# Patient Record
Sex: Female | Born: 1945 | Race: Black or African American | Hispanic: No | Marital: Married | Smoking: Former smoker
Health system: Southern US, Community
[De-identification: ages and names within clinical notes are randomized; demographics above are authoritative.]

## PROBLEM LIST (undated history)

## (undated) DIAGNOSIS — A159 Respiratory tuberculosis unspecified: Secondary | ICD-10-CM

## (undated) DIAGNOSIS — I890 Lymphedema, not elsewhere classified: Secondary | ICD-10-CM

## (undated) DIAGNOSIS — I10 Essential (primary) hypertension: Secondary | ICD-10-CM

## (undated) DIAGNOSIS — C801 Malignant (primary) neoplasm, unspecified: Secondary | ICD-10-CM

## (undated) DIAGNOSIS — G629 Polyneuropathy, unspecified: Secondary | ICD-10-CM

## (undated) DIAGNOSIS — Z923 Personal history of irradiation: Secondary | ICD-10-CM

## (undated) DIAGNOSIS — R0602 Shortness of breath: Secondary | ICD-10-CM

## (undated) DIAGNOSIS — M1990 Unspecified osteoarthritis, unspecified site: Secondary | ICD-10-CM

## (undated) DIAGNOSIS — E785 Hyperlipidemia, unspecified: Secondary | ICD-10-CM

## (undated) DIAGNOSIS — H9311 Tinnitus, right ear: Secondary | ICD-10-CM

## (undated) DIAGNOSIS — C50919 Malignant neoplasm of unspecified site of unspecified female breast: Secondary | ICD-10-CM

## (undated) DIAGNOSIS — I499 Cardiac arrhythmia, unspecified: Secondary | ICD-10-CM

## (undated) HISTORY — DX: Hyperlipidemia, unspecified: E78.5

## (undated) HISTORY — PX: TUBAL LIGATION: SHX77

## (undated) HISTORY — PX: LUNG BIOPSY: SHX232

## (undated) HISTORY — DX: Lymphedema, not elsewhere classified: I89.0

## (undated) HISTORY — PX: LESION EXCISION: SHX5167

## (undated) HISTORY — PX: ABDOMINAL HYSTERECTOMY: SHX81

## (undated) HISTORY — PX: PORTACATH PLACEMENT: SHX2246

## (undated) HISTORY — DX: Unspecified osteoarthritis, unspecified site: M19.90

---

## 1999-03-31 ENCOUNTER — Ambulatory Visit (HOSPITAL_COMMUNITY): Admission: RE | Admit: 1999-03-31 | Discharge: 1999-03-31 | Payer: Self-pay | Admitting: *Deleted

## 2000-04-24 ENCOUNTER — Emergency Department (HOSPITAL_COMMUNITY): Admission: EM | Admit: 2000-04-24 | Discharge: 2000-04-24 | Payer: Self-pay | Admitting: Emergency Medicine

## 2001-10-01 ENCOUNTER — Encounter: Payer: Self-pay | Admitting: Emergency Medicine

## 2001-10-01 ENCOUNTER — Emergency Department (HOSPITAL_COMMUNITY): Admission: EM | Admit: 2001-10-01 | Discharge: 2001-10-01 | Payer: Self-pay | Admitting: Emergency Medicine

## 2001-10-18 ENCOUNTER — Encounter: Payer: Self-pay | Admitting: Thoracic Surgery

## 2001-10-18 ENCOUNTER — Encounter (INDEPENDENT_AMBULATORY_CARE_PROVIDER_SITE_OTHER): Payer: Self-pay

## 2001-10-18 ENCOUNTER — Inpatient Hospital Stay (HOSPITAL_COMMUNITY): Admission: RE | Admit: 2001-10-18 | Discharge: 2001-10-23 | Payer: Self-pay | Admitting: Thoracic Surgery

## 2001-10-19 ENCOUNTER — Encounter: Payer: Self-pay | Admitting: Thoracic Surgery

## 2001-10-22 ENCOUNTER — Encounter: Payer: Self-pay | Admitting: Thoracic Surgery

## 2001-10-31 ENCOUNTER — Encounter: Admission: RE | Admit: 2001-10-31 | Discharge: 2001-10-31 | Payer: Self-pay | Admitting: Thoracic Surgery

## 2001-10-31 ENCOUNTER — Encounter: Payer: Self-pay | Admitting: Thoracic Surgery

## 2001-11-27 ENCOUNTER — Encounter: Admission: RE | Admit: 2001-11-27 | Discharge: 2001-11-27 | Payer: Self-pay | Admitting: Internal Medicine

## 2002-02-26 ENCOUNTER — Encounter: Admission: RE | Admit: 2002-02-26 | Discharge: 2002-02-26 | Payer: Self-pay | Admitting: Internal Medicine

## 2002-03-03 ENCOUNTER — Emergency Department (HOSPITAL_COMMUNITY): Admission: EM | Admit: 2002-03-03 | Discharge: 2002-03-04 | Payer: Self-pay | Admitting: Emergency Medicine

## 2002-03-26 ENCOUNTER — Encounter: Admission: RE | Admit: 2002-03-26 | Discharge: 2002-03-26 | Payer: Self-pay | Admitting: Internal Medicine

## 2002-06-03 ENCOUNTER — Ambulatory Visit (HOSPITAL_COMMUNITY): Admission: RE | Admit: 2002-06-03 | Discharge: 2002-06-03 | Payer: Self-pay | Admitting: Orthopedic Surgery

## 2002-06-03 ENCOUNTER — Encounter: Payer: Self-pay | Admitting: Orthopedic Surgery

## 2002-06-04 ENCOUNTER — Ambulatory Visit (HOSPITAL_COMMUNITY): Admission: RE | Admit: 2002-06-04 | Discharge: 2002-06-04 | Payer: Self-pay | Admitting: Orthopedic Surgery

## 2003-06-10 ENCOUNTER — Ambulatory Visit (HOSPITAL_BASED_OUTPATIENT_CLINIC_OR_DEPARTMENT_OTHER): Admission: RE | Admit: 2003-06-10 | Discharge: 2003-06-10 | Payer: Self-pay | Admitting: Cardiovascular Disease

## 2003-06-19 ENCOUNTER — Encounter: Payer: Self-pay | Admitting: Cardiovascular Disease

## 2003-06-19 ENCOUNTER — Ambulatory Visit (HOSPITAL_COMMUNITY): Admission: RE | Admit: 2003-06-19 | Discharge: 2003-06-19 | Payer: Self-pay | Admitting: Cardiovascular Disease

## 2003-07-17 ENCOUNTER — Ambulatory Visit (HOSPITAL_COMMUNITY): Admission: RE | Admit: 2003-07-17 | Discharge: 2003-07-17 | Payer: Self-pay | Admitting: Family Medicine

## 2003-07-29 ENCOUNTER — Other Ambulatory Visit: Admission: RE | Admit: 2003-07-29 | Discharge: 2003-07-29 | Payer: Self-pay | Admitting: Internal Medicine

## 2004-01-23 ENCOUNTER — Ambulatory Visit (HOSPITAL_COMMUNITY): Admission: RE | Admit: 2004-01-23 | Discharge: 2004-01-23 | Payer: Self-pay | Admitting: Internal Medicine

## 2004-02-07 ENCOUNTER — Emergency Department (HOSPITAL_COMMUNITY): Admission: EM | Admit: 2004-02-07 | Discharge: 2004-02-07 | Payer: Self-pay | Admitting: *Deleted

## 2004-06-02 ENCOUNTER — Ambulatory Visit: Payer: Self-pay | Admitting: Internal Medicine

## 2004-09-01 ENCOUNTER — Emergency Department (HOSPITAL_COMMUNITY): Admission: EM | Admit: 2004-09-01 | Discharge: 2004-09-01 | Payer: Self-pay | Admitting: Emergency Medicine

## 2007-07-23 ENCOUNTER — Emergency Department (HOSPITAL_COMMUNITY): Admission: EM | Admit: 2007-07-23 | Discharge: 2007-07-23 | Payer: Self-pay | Admitting: Family Medicine

## 2007-07-29 ENCOUNTER — Emergency Department (HOSPITAL_COMMUNITY): Admission: EM | Admit: 2007-07-29 | Discharge: 2007-07-29 | Payer: Self-pay | Admitting: *Deleted

## 2007-08-31 ENCOUNTER — Ambulatory Visit: Payer: Self-pay | Admitting: Internal Medicine

## 2007-09-14 ENCOUNTER — Encounter: Payer: Self-pay | Admitting: Internal Medicine

## 2007-09-14 ENCOUNTER — Ambulatory Visit: Payer: Self-pay | Admitting: Internal Medicine

## 2007-09-17 ENCOUNTER — Encounter: Admission: RE | Admit: 2007-09-17 | Discharge: 2007-09-17 | Payer: Self-pay | Admitting: *Deleted

## 2007-09-17 ENCOUNTER — Encounter (INDEPENDENT_AMBULATORY_CARE_PROVIDER_SITE_OTHER): Payer: Self-pay | Admitting: Diagnostic Radiology

## 2007-10-18 ENCOUNTER — Encounter (INDEPENDENT_AMBULATORY_CARE_PROVIDER_SITE_OTHER): Payer: Self-pay | Admitting: General Surgery

## 2007-10-18 ENCOUNTER — Ambulatory Visit (HOSPITAL_COMMUNITY): Admission: RE | Admit: 2007-10-18 | Discharge: 2007-10-18 | Payer: Self-pay | Admitting: General Surgery

## 2007-10-18 ENCOUNTER — Encounter: Admission: RE | Admit: 2007-10-18 | Discharge: 2007-10-18 | Payer: Self-pay | Admitting: General Surgery

## 2009-01-29 ENCOUNTER — Emergency Department (HOSPITAL_COMMUNITY): Admission: EM | Admit: 2009-01-29 | Discharge: 2009-01-29 | Payer: Self-pay | Admitting: Emergency Medicine

## 2009-06-05 ENCOUNTER — Encounter (INDEPENDENT_AMBULATORY_CARE_PROVIDER_SITE_OTHER): Payer: Self-pay | Admitting: Obstetrics & Gynecology

## 2009-06-05 ENCOUNTER — Ambulatory Visit (HOSPITAL_COMMUNITY): Admission: RE | Admit: 2009-06-05 | Discharge: 2009-06-05 | Payer: Self-pay | Admitting: Internal Medicine

## 2009-07-18 ENCOUNTER — Emergency Department (HOSPITAL_COMMUNITY): Admission: EM | Admit: 2009-07-18 | Discharge: 2009-07-18 | Payer: Self-pay | Admitting: Emergency Medicine

## 2009-08-14 ENCOUNTER — Inpatient Hospital Stay (HOSPITAL_COMMUNITY): Admission: RE | Admit: 2009-08-14 | Discharge: 2009-08-16 | Payer: Self-pay | Admitting: Obstetrics & Gynecology

## 2009-08-14 ENCOUNTER — Encounter (INDEPENDENT_AMBULATORY_CARE_PROVIDER_SITE_OTHER): Payer: Self-pay | Admitting: Obstetrics & Gynecology

## 2010-05-11 ENCOUNTER — Emergency Department (HOSPITAL_COMMUNITY): Admission: EM | Admit: 2010-05-11 | Discharge: 2010-05-11 | Payer: Self-pay | Admitting: Emergency Medicine

## 2010-05-13 ENCOUNTER — Encounter: Admission: RE | Admit: 2010-05-13 | Discharge: 2010-05-13 | Payer: Self-pay | Admitting: Internal Medicine

## 2010-05-17 ENCOUNTER — Encounter: Admission: RE | Admit: 2010-05-17 | Discharge: 2010-05-17 | Payer: Self-pay | Admitting: Internal Medicine

## 2010-05-25 ENCOUNTER — Encounter: Admission: RE | Admit: 2010-05-25 | Discharge: 2010-05-25 | Payer: Self-pay | Admitting: Internal Medicine

## 2010-05-27 ENCOUNTER — Encounter: Admission: RE | Admit: 2010-05-27 | Discharge: 2010-05-27 | Payer: Self-pay | Admitting: Internal Medicine

## 2010-06-01 ENCOUNTER — Ambulatory Visit: Payer: Self-pay | Admitting: Oncology

## 2010-06-03 ENCOUNTER — Encounter: Admission: RE | Admit: 2010-06-03 | Discharge: 2010-06-03 | Payer: Self-pay | Admitting: Internal Medicine

## 2010-06-08 ENCOUNTER — Ambulatory Visit: Payer: Self-pay | Admitting: Oncology

## 2010-06-09 LAB — CBC WITH DIFFERENTIAL/PLATELET
Eosinophils Absolute: 0.1 10*3/uL (ref 0.0–0.5)
HCT: 34.2 % — ABNORMAL LOW (ref 34.8–46.6)
LYMPH%: 26.8 % (ref 14.0–49.7)
MCHC: 32.5 g/dL (ref 31.5–36.0)
MCV: 91.9 fL (ref 79.5–101.0)
MONO#: 0.5 10*3/uL (ref 0.1–0.9)
MONO%: 9.6 % (ref 0.0–14.0)
NEUT#: 3 10*3/uL (ref 1.5–6.5)
NEUT%: 62.2 % (ref 38.4–76.8)
Platelets: 283 10*3/uL (ref 145–400)
WBC: 4.9 10*3/uL (ref 3.9–10.3)

## 2010-06-09 LAB — COMPREHENSIVE METABOLIC PANEL
BUN: 18 mg/dL (ref 6–23)
CO2: 28 mEq/L (ref 19–32)
Calcium: 9 mg/dL (ref 8.4–10.5)
Chloride: 103 mEq/L (ref 96–112)
Creatinine, Ser: 0.84 mg/dL (ref 0.40–1.20)

## 2010-06-09 LAB — CANCER ANTIGEN 27.29: CA 27.29: 87 U/mL — ABNORMAL HIGH (ref 0–39)

## 2010-06-15 ENCOUNTER — Ambulatory Visit (HOSPITAL_COMMUNITY): Admission: RE | Admit: 2010-06-15 | Discharge: 2010-06-15 | Payer: Self-pay | Admitting: General Surgery

## 2010-06-16 ENCOUNTER — Ambulatory Visit (HOSPITAL_COMMUNITY): Admission: RE | Admit: 2010-06-16 | Discharge: 2010-06-16 | Payer: Self-pay | Admitting: Oncology

## 2010-06-17 ENCOUNTER — Ambulatory Visit: Payer: Self-pay | Admitting: Cardiology

## 2010-06-17 ENCOUNTER — Encounter: Payer: Self-pay | Admitting: Oncology

## 2010-06-17 ENCOUNTER — Ambulatory Visit: Admission: RE | Admit: 2010-06-17 | Discharge: 2010-06-17 | Payer: Self-pay | Admitting: Oncology

## 2010-07-01 LAB — CBC WITH DIFFERENTIAL/PLATELET
Eosinophils Absolute: 0.1 10*3/uL (ref 0.0–0.5)
HCT: 34.1 % — ABNORMAL LOW (ref 34.8–46.6)
LYMPH%: 12.3 % — ABNORMAL LOW (ref 14.0–49.7)
MCHC: 33.1 g/dL (ref 31.5–36.0)
MCV: 90.9 fL (ref 79.5–101.0)
MONO#: 2.5 10*3/uL — ABNORMAL HIGH (ref 0.1–0.9)
MONO%: 15.2 % — ABNORMAL HIGH (ref 0.0–14.0)
NEUT#: 11.8 10*3/uL — ABNORMAL HIGH (ref 1.5–6.5)
NEUT%: 71.8 % (ref 38.4–76.8)
Platelets: 173 10*3/uL (ref 145–400)
RBC: 3.75 10*6/uL (ref 3.70–5.45)
WBC: 16.5 10*3/uL — ABNORMAL HIGH (ref 3.9–10.3)

## 2010-07-01 LAB — COMPREHENSIVE METABOLIC PANEL
BUN: 20 mg/dL (ref 6–23)
CO2: 25 mEq/L (ref 19–32)
Calcium: 9.6 mg/dL (ref 8.4–10.5)
Chloride: 100 mEq/L (ref 96–112)
Creatinine, Ser: 0.88 mg/dL (ref 0.40–1.20)
Glucose, Bld: 137 mg/dL — ABNORMAL HIGH (ref 70–99)
Total Bilirubin: 0.3 mg/dL (ref 0.3–1.2)

## 2010-07-08 ENCOUNTER — Ambulatory Visit: Payer: Self-pay | Admitting: Oncology

## 2010-07-08 LAB — CBC WITH DIFFERENTIAL/PLATELET
BASO%: 0.3 % (ref 0.0–2.0)
EOS%: 0 % (ref 0.0–7.0)
HCT: 31.5 % — ABNORMAL LOW (ref 34.8–46.6)
LYMPH%: 9.1 % — ABNORMAL LOW (ref 14.0–49.7)
MCH: 29.6 pg (ref 25.1–34.0)
MCHC: 32.4 g/dL (ref 31.5–36.0)
MCV: 91.3 fL (ref 79.5–101.0)
MONO#: 0.2 10*3/uL (ref 0.1–0.9)
MONO%: 2.3 % (ref 0.0–14.0)
NEUT%: 88.3 % — ABNORMAL HIGH (ref 38.4–76.8)
Platelets: 123 10*3/uL — ABNORMAL LOW (ref 145–400)
RBC: 3.45 10*6/uL — ABNORMAL LOW (ref 3.70–5.45)
WBC: 9.2 10*3/uL (ref 3.9–10.3)
nRBC: 0 % (ref 0–0)

## 2010-07-14 ENCOUNTER — Encounter: Payer: Self-pay | Admitting: Oncology

## 2010-07-14 ENCOUNTER — Ambulatory Visit
Admission: RE | Admit: 2010-07-14 | Discharge: 2010-07-14 | Payer: Self-pay | Source: Home / Self Care | Admitting: Oncology

## 2010-07-14 ENCOUNTER — Ambulatory Visit: Payer: Self-pay | Admitting: Cardiovascular Disease

## 2010-07-15 LAB — CBC WITH DIFFERENTIAL/PLATELET
Basophils Absolute: 0.1 10*3/uL (ref 0.0–0.1)
EOS%: 1.4 % (ref 0.0–7.0)
Eosinophils Absolute: 0.1 10*3/uL (ref 0.0–0.5)
HCT: 30.6 % — ABNORMAL LOW (ref 34.8–46.6)
HGB: 9.7 g/dL — ABNORMAL LOW (ref 11.6–15.9)
MCH: 29.7 pg (ref 25.1–34.0)
MCV: 93.6 fL (ref 79.5–101.0)
MONO%: 18.3 % — ABNORMAL HIGH (ref 0.0–14.0)
NEUT#: 2 10*3/uL (ref 1.5–6.5)
NEUT%: 44.6 % (ref 38.4–76.8)
lymph#: 1.5 10*3/uL (ref 0.9–3.3)

## 2010-07-15 LAB — COMPREHENSIVE METABOLIC PANEL
CO2: 23 mEq/L (ref 19–32)
Chloride: 105 mEq/L (ref 96–112)
Glucose, Bld: 153 mg/dL — ABNORMAL HIGH (ref 70–99)
Potassium: 4.4 mEq/L (ref 3.5–5.3)
Sodium: 137 mEq/L (ref 135–145)
Total Bilirubin: 0.4 mg/dL (ref 0.3–1.2)

## 2010-07-21 ENCOUNTER — Ambulatory Visit: Payer: Self-pay | Admitting: Cardiovascular Disease

## 2010-07-21 ENCOUNTER — Ambulatory Visit (HOSPITAL_COMMUNITY): Admission: RE | Admit: 2010-07-21 | Discharge: 2010-07-21 | Payer: Self-pay | Admitting: Oncology

## 2010-07-21 ENCOUNTER — Encounter: Payer: Self-pay | Admitting: Oncology

## 2010-07-23 LAB — CBC WITH DIFFERENTIAL/PLATELET
Basophils Absolute: 0 10*3/uL (ref 0.0–0.1)
EOS%: 0.7 % (ref 0.0–7.0)
Eosinophils Absolute: 0.1 10*3/uL (ref 0.0–0.5)
HGB: 9.5 g/dL — ABNORMAL LOW (ref 11.6–15.9)
LYMPH%: 23.2 % (ref 14.0–49.7)
MCH: 30.1 pg (ref 25.1–34.0)
MCV: 91.8 fL (ref 79.5–101.0)
MONO%: 13.3 % (ref 0.0–14.0)
Platelets: 163 10*3/uL (ref 145–400)
RDW: 13.5 % (ref 11.2–14.5)

## 2010-07-29 ENCOUNTER — Encounter: Payer: Self-pay | Admitting: Cardiovascular Disease

## 2010-07-29 LAB — CBC WITH DIFFERENTIAL/PLATELET
BASO%: 1 % (ref 0.0–2.0)
EOS%: 0 % (ref 0.0–7.0)
LYMPH%: 36.3 % (ref 14.0–49.7)
MCHC: 32.5 g/dL (ref 31.5–36.0)
MCV: 92 fL (ref 79.5–101.0)
MONO#: 0.7 10*3/uL (ref 0.1–0.9)
MONO%: 11.3 % (ref 0.0–14.0)
Platelets: 162 10*3/uL (ref 145–400)
RBC: 3.14 10*6/uL — ABNORMAL LOW (ref 3.70–5.45)
WBC: 6.1 10*3/uL (ref 3.9–10.3)
nRBC: 0 % (ref 0–0)

## 2010-08-05 ENCOUNTER — Encounter: Payer: Self-pay | Admitting: Cardiovascular Disease

## 2010-08-05 LAB — COMPREHENSIVE METABOLIC PANEL
AST: 17 U/L (ref 0–37)
Albumin: 3.4 g/dL — ABNORMAL LOW (ref 3.5–5.2)
Alkaline Phosphatase: 55 U/L (ref 39–117)
Potassium: 4.2 mEq/L (ref 3.5–5.3)
Sodium: 139 mEq/L (ref 135–145)
Total Bilirubin: 0.3 mg/dL (ref 0.3–1.2)
Total Protein: 6.7 g/dL (ref 6.0–8.3)

## 2010-08-05 LAB — CBC WITH DIFFERENTIAL/PLATELET
BASO%: 0.9 % (ref 0.0–2.0)
EOS%: 0.4 % (ref 0.0–7.0)
MCH: 29.6 pg (ref 25.1–34.0)
MCHC: 32.2 g/dL (ref 31.5–36.0)
RBC: 3.18 10*6/uL — ABNORMAL LOW (ref 3.70–5.45)
RDW: 14.9 % — ABNORMAL HIGH (ref 11.2–14.5)
lymph#: 2 10*3/uL (ref 0.9–3.3)

## 2010-08-10 ENCOUNTER — Ambulatory Visit (HOSPITAL_COMMUNITY)
Admission: RE | Admit: 2010-08-10 | Discharge: 2010-08-10 | Payer: Self-pay | Source: Home / Self Care | Attending: Oncology | Admitting: Oncology

## 2010-08-10 ENCOUNTER — Ambulatory Visit: Payer: Self-pay | Admitting: Oncology

## 2010-08-10 ENCOUNTER — Encounter: Payer: Self-pay | Admitting: Oncology

## 2010-08-12 ENCOUNTER — Encounter: Payer: Self-pay | Admitting: Cardiovascular Disease

## 2010-08-12 LAB — CBC WITH DIFFERENTIAL/PLATELET
BASO%: 0.4 % (ref 0.0–2.0)
Basophils Absolute: 0 10*3/uL (ref 0.0–0.1)
EOS%: 0.5 % (ref 0.0–7.0)
HCT: 30.5 % — ABNORMAL LOW (ref 34.8–46.6)
HGB: 9.7 g/dL — ABNORMAL LOW (ref 11.6–15.9)
LYMPH%: 18.5 % (ref 14.0–49.7)
MCH: 30 pg (ref 25.1–34.0)
MCHC: 31.8 g/dL (ref 31.5–36.0)
NEUT%: 71 % (ref 38.4–76.8)
Platelets: 224 10*3/uL (ref 145–400)
lymph#: 1.7 10*3/uL (ref 0.9–3.3)

## 2010-08-19 LAB — CBC WITH DIFFERENTIAL/PLATELET
Basophils Absolute: 0 10*3/uL (ref 0.0–0.1)
EOS%: 0.1 % (ref 0.0–7.0)
HGB: 9.4 g/dL — ABNORMAL LOW (ref 11.6–15.9)
LYMPH%: 23.3 % (ref 14.0–49.7)
MCH: 30 pg (ref 25.1–34.0)
MCV: 94.2 fL (ref 79.5–101.0)
MONO%: 8.4 % (ref 0.0–14.0)
Platelets: 153 10*3/uL (ref 145–400)
RBC: 3.13 10*6/uL — ABNORMAL LOW (ref 3.70–5.45)
RDW: 15.8 % — ABNORMAL HIGH (ref 11.2–14.5)

## 2010-08-26 LAB — CBC WITH DIFFERENTIAL/PLATELET
BASO%: 0.5 % (ref 0.0–2.0)
EOS%: 0.1 % (ref 0.0–7.0)
HCT: 29.6 % — ABNORMAL LOW (ref 34.8–46.6)
LYMPH%: 21.3 % (ref 14.0–49.7)
MCH: 31.5 pg (ref 25.1–34.0)
MCHC: 32.6 g/dL (ref 31.5–36.0)
MCV: 96.6 fL (ref 79.5–101.0)
MONO#: 0.4 10*3/uL (ref 0.1–0.9)
MONO%: 9.6 % (ref 0.0–14.0)
NEUT%: 68.5 % (ref 38.4–76.8)
Platelets: 236 10*3/uL (ref 145–400)
RBC: 3.06 10*6/uL — ABNORMAL LOW (ref 3.70–5.45)
WBC: 4.7 10*3/uL (ref 3.9–10.3)

## 2010-08-26 LAB — COMPREHENSIVE METABOLIC PANEL
ALT: 12 U/L (ref 0–35)
AST: 15 U/L (ref 0–37)
Alkaline Phosphatase: 52 U/L (ref 39–117)
Creatinine, Ser: 0.9 mg/dL (ref 0.40–1.20)
Sodium: 138 mEq/L (ref 135–145)
Total Bilirubin: 0.3 mg/dL (ref 0.3–1.2)
Total Protein: 7.3 g/dL (ref 6.0–8.3)

## 2010-09-02 ENCOUNTER — Encounter: Payer: Self-pay | Admitting: Cardiovascular Disease

## 2010-09-02 LAB — COMPREHENSIVE METABOLIC PANEL
ALT: 18 U/L (ref 0–35)
AST: 18 U/L (ref 0–37)
Albumin: 3.5 g/dL (ref 3.5–5.2)
Alkaline Phosphatase: 75 U/L (ref 39–117)
BUN: 11 mg/dL (ref 6–23)
CO2: 29 mEq/L (ref 19–32)
Calcium: 9.2 mg/dL (ref 8.4–10.5)
Chloride: 98 mEq/L (ref 96–112)
Creatinine, Ser: 0.96 mg/dL (ref 0.40–1.20)
Glucose, Bld: 121 mg/dL — ABNORMAL HIGH (ref 70–99)
Potassium: 4.1 mEq/L (ref 3.5–5.3)
Sodium: 133 mEq/L — ABNORMAL LOW (ref 135–145)
Total Bilirubin: 0.6 mg/dL (ref 0.3–1.2)
Total Protein: 7.9 g/dL (ref 6.0–8.3)

## 2010-09-02 LAB — CBC WITH DIFFERENTIAL/PLATELET
BASO%: 0.5 % (ref 0.0–2.0)
Basophils Absolute: 0 10*3/uL (ref 0.0–0.1)
EOS%: 0.5 % (ref 0.0–7.0)
Eosinophils Absolute: 0 10*3/uL (ref 0.0–0.5)
HCT: 28.6 % — ABNORMAL LOW (ref 34.8–46.6)
HGB: 9.3 g/dL — ABNORMAL LOW (ref 11.6–15.9)
LYMPH%: 13.6 % — ABNORMAL LOW (ref 14.0–49.7)
MCH: 30.8 pg (ref 25.1–34.0)
MCHC: 32.5 g/dL (ref 31.5–36.0)
MCV: 94.7 fL (ref 79.5–101.0)
MONO#: 0.8 10*3/uL (ref 0.1–0.9)
MONO%: 10.5 % (ref 0.0–14.0)
NEUT#: 5.8 10*3/uL (ref 1.5–6.5)
NEUT%: 74.9 % (ref 38.4–76.8)
Platelets: 162 10*3/uL (ref 145–400)
RBC: 3.02 10*6/uL — ABNORMAL LOW (ref 3.70–5.45)
RDW: 15.4 % — ABNORMAL HIGH (ref 11.2–14.5)
WBC: 7.8 10*3/uL (ref 3.9–10.3)
lymph#: 1.1 10*3/uL (ref 0.9–3.3)
nRBC: 0 % (ref 0–0)

## 2010-09-02 LAB — TECHNOLOGIST REVIEW

## 2010-09-08 ENCOUNTER — Ambulatory Visit (HOSPITAL_COMMUNITY)
Admission: RE | Admit: 2010-09-08 | Discharge: 2010-09-08 | Payer: Self-pay | Source: Home / Self Care | Attending: Oncology | Admitting: Oncology

## 2010-09-08 ENCOUNTER — Encounter: Payer: Self-pay | Admitting: Oncology

## 2010-09-09 ENCOUNTER — Encounter: Payer: Self-pay | Admitting: Cardiovascular Disease

## 2010-09-09 LAB — CBC WITH DIFFERENTIAL/PLATELET
BASO%: 0.5 % (ref 0.0–2.0)
Basophils Absolute: 0 10*3/uL (ref 0.0–0.1)
EOS%: 0.2 % (ref 0.0–7.0)
Eosinophils Absolute: 0 10*3/uL (ref 0.0–0.5)
HCT: 26.3 % — ABNORMAL LOW (ref 34.8–46.6)
HGB: 8.5 g/dL — ABNORMAL LOW (ref 11.6–15.9)
LYMPH%: 28.1 % (ref 14.0–49.7)
MCH: 30.8 pg (ref 25.1–34.0)
MCHC: 32.3 g/dL (ref 31.5–36.0)
MCV: 95.3 fL (ref 79.5–101.0)
MONO#: 0.6 10*3/uL (ref 0.1–0.9)
MONO%: 10.6 % (ref 0.0–14.0)
NEUT#: 3.5 10*3/uL (ref 1.5–6.5)
NEUT%: 60.6 % (ref 38.4–76.8)
Platelets: 149 10*3/uL (ref 145–400)
RBC: 2.76 10*6/uL — ABNORMAL LOW (ref 3.70–5.45)
RDW: 16.2 % — ABNORMAL HIGH (ref 11.2–14.5)
WBC: 5.8 10*3/uL (ref 3.9–10.3)
lymph#: 1.6 10*3/uL (ref 0.9–3.3)
nRBC: 0 % (ref 0–0)

## 2010-09-14 ENCOUNTER — Ambulatory Visit: Payer: Self-pay | Admitting: Oncology

## 2010-09-16 ENCOUNTER — Encounter: Payer: Self-pay | Admitting: Cardiovascular Disease

## 2010-09-16 LAB — COMPREHENSIVE METABOLIC PANEL
ALT: 12 U/L (ref 0–35)
AST: 14 U/L (ref 0–37)
Albumin: 3.6 g/dL (ref 3.5–5.2)
Alkaline Phosphatase: 45 U/L (ref 39–117)
BUN: 14 mg/dL (ref 6–23)
CO2: 26 mEq/L (ref 19–32)
Calcium: 8.9 mg/dL (ref 8.4–10.5)
Chloride: 104 mEq/L (ref 96–112)
Creatinine, Ser: 0.83 mg/dL (ref 0.40–1.20)
Glucose, Bld: 129 mg/dL — ABNORMAL HIGH (ref 70–99)
Potassium: 4.1 mEq/L (ref 3.5–5.3)
Sodium: 137 mEq/L (ref 135–145)
Total Bilirubin: 0.3 mg/dL (ref 0.3–1.2)
Total Protein: 6.6 g/dL (ref 6.0–8.3)

## 2010-09-16 LAB — CBC WITH DIFFERENTIAL/PLATELET
BASO%: 0.3 % (ref 0.0–2.0)
Basophils Absolute: 0 10*3/uL (ref 0.0–0.1)
EOS%: 0 % (ref 0.0–7.0)
Eosinophils Absolute: 0 10*3/uL (ref 0.0–0.5)
HCT: 28.3 % — ABNORMAL LOW (ref 34.8–46.6)
HGB: 9.1 g/dL — ABNORMAL LOW (ref 11.6–15.9)
LYMPH%: 26.7 % (ref 14.0–49.7)
MCH: 31.2 pg (ref 25.1–34.0)
MCHC: 32.2 g/dL (ref 31.5–36.0)
MCV: 96.9 fL (ref 79.5–101.0)
MONO#: 0.3 10*3/uL (ref 0.1–0.9)
MONO%: 8.1 % (ref 0.0–14.0)
NEUT#: 2.2 10*3/uL (ref 1.5–6.5)
NEUT%: 64.9 % (ref 38.4–76.8)
Platelets: 213 10*3/uL (ref 145–400)
RBC: 2.92 10*6/uL — ABNORMAL LOW (ref 3.70–5.45)
RDW: 16.5 % — ABNORMAL HIGH (ref 11.2–14.5)
WBC: 3.5 10*3/uL — ABNORMAL LOW (ref 3.9–10.3)
lymph#: 0.9 10*3/uL (ref 0.9–3.3)
nRBC: 0 % (ref 0–0)

## 2010-09-18 ENCOUNTER — Encounter: Payer: Self-pay | Admitting: Oncology

## 2010-09-19 ENCOUNTER — Encounter: Payer: Self-pay | Admitting: General Surgery

## 2010-09-23 ENCOUNTER — Encounter: Payer: Self-pay | Admitting: Cardiovascular Disease

## 2010-09-23 LAB — CBC WITH DIFFERENTIAL/PLATELET
Basophils Absolute: 0 10*3/uL (ref 0.0–0.1)
Eosinophils Absolute: 0 10*3/uL (ref 0.0–0.5)
HCT: 27.3 % — ABNORMAL LOW (ref 34.8–46.6)
HGB: 8.9 g/dL — ABNORMAL LOW (ref 11.6–15.9)
MCH: 31.9 pg (ref 25.1–34.0)
MCV: 97.8 fL (ref 79.5–101.0)
MONO%: 9.8 % (ref 0.0–14.0)
NEUT#: 2.1 10*3/uL (ref 1.5–6.5)
NEUT%: 59 % (ref 38.4–76.8)
RDW: 15.3 % — ABNORMAL HIGH (ref 11.2–14.5)

## 2010-09-28 ENCOUNTER — Ambulatory Visit (HOSPITAL_COMMUNITY)
Admission: RE | Admit: 2010-09-28 | Discharge: 2010-09-28 | Payer: Self-pay | Source: Home / Self Care | Attending: Cardiology | Admitting: Cardiology

## 2010-09-28 LAB — SURGICAL PCR SCREEN
MRSA, PCR: NEGATIVE
Staphylococcus aureus: NEGATIVE

## 2010-09-29 NOTE — Procedures (Signed)
[name], [name]:  [id number]  MEDICAL RECORD NO.:  [id number]          PATIENT TYPE:  OIB  LOCATION:  6522                         FACILITY:  [facility]  PHYSICIAN:  Landry Corporal, MD DATE OF BIRTH:  Aug 06, 1946  DATE OF PROCEDURE:  09/28/2010 DATE OF DISCHARGE:  09/28/2010                           CARDIAC CATHETERIZATION   PERFORMING PHYSICIAN/PRIMARY CARDIOLOGIST:  Landry Corporal, MD  PRIMARY CARE PROVIDER:  Bennett Scrape, MD  ONCOLOGIST:  Pierce Crane, MD  SURGEON:  Chevis Pretty, MD  PROCEDURES PERFORMED: 1.  Retrograde Left Heart Catheterization 2.  Left Ventriculography in RAO and LAO projection 3.  Selective Coronary Angiography 4.  Arterial and Venous Oxygen Saturation measurement  INDICATIONS:  Preoperative evaluation for decreased ejection fraction on echocardiogram with shortness of breath on exertion and a positive Persantine  Myoview with inferolateral scar and baseline inferior ischemia.  BRIEF HISTORY:  [name] is a very pleasant 65 year old woman following at [facility] who saw for preoperative evaluation for mastectomy for breast cancer.  Her breast infections are noted to be decreased on recent echocardiograms as part of the evaluation for chemotherapy.  Due to this concern, she was recommended for preoperative evaluation where she underwent Persantine Myoview, which showed possible ischemia and was considered to be high risk.  She is now then referred for diagnostic cardiac catheterization and possible percutaneous intervention.  The risks, benefits, alternatives, and indications of the procedure were explained to the patient in detail.  All questions were answered and the patient was understanding and agreed to proceed with the procedure. Informed consent was obtained with a signed form placed on the chart.  PROCEDURE:  The patient was brought from the Same Day lounge to  the second floor [facility], in the fasting state.  She was prepped and draped in the usual sterile fashion after a modified Allen's/Barbeau test was performed as to demonstrate adequate collateral perfusion of the right hand via the ulnar artery.  The right radial artery and right groin were then prepped and the patient was draped in a sterile fashion.  After a time-out period was performed, the patient was sedated with intravenous Versed and fentanyl.  The right wrist was anesthetized with 1% subcutaneous lidocaine and the right radial artery was accessed using the modified Seldinger technique with placement of 5-French Glide sheath.  Sheath was aspirated and flushed, and then infiltrated with 10 mL of standard radial cocktail.  After infusion of the cocktail, the patient was also given 5000 units of intravenous heparin.  First, a 5-French TIG-4 catheter was advanced over the wire and advanced into left ventricle for management of the left ventricles easily into the ascending aorta across the aortic valve into the left ventricle and left ventricle hemodynamics were measured.  The catheters were then pulled back across the aortic valve and used to engage the right coronary artery first.  Multiple angiographic views of first right then the left coronary artery were obtained, as the catheter was redirected into the left coronary artery.  After a completion of the left coronary angiography,  the catheter were then exchanged over wire for a 5-French angled pigtail catheter, which was used to advance across the aortic valve.  Left ventriculogram was performed in both the RAO and LAO cranial projections with first 12 mL/second of contrast for 25 seconds, and then the LAO projection was attempted 10 mL/second for 20 seconds.  The catheters were then pulled back across the aortic valve and removed completely out of the body over a wire without any complications.  The  sheath was then removed in the cath lab with a TR band was placed at 9:54 a.m. with 14 mL of air.  Adequate nonocclusive hemostasis was obtained.  CATHETERIZATION STATISTICS: 1. Sedation with 2 mg IV Versed, 50 mcg IV fentanyl. 2. Total of 5000 units of IV heparin was induced. 3. Radial cocktail included 2 mL of 1% lidocaine, 400 mcg     nitroglycerin and 5 mg of verapamil.  HEMODYNAMICS: 1. Left ventricular pressure was 102/9 mmHg with LVEDP of 18 mmHg. 2. Aortic pressure was 105/60 mmHg with a mean of 72 mmHg, there was     no grading across the aortic valve.  ANGIOGRAPHIC FINDINGS: 1. Left ventriculography performed in both the RAO and LAO projections     demonstrated a mildly decreased ejection fraction of roughly 45%     with mild inferobasilar hypokinesis, slightly greater than the rest     of global hypokinesis.  There is no MR noted. 2. The left main is a large caliber vessel that bifurcates into the     LAD and circumflex artery, with no significant disease.  The LAD     itself is a large-caliber vessel, which was down to the apex.  It     gives rise to a bifurcating small first diagonal branch, where the     inferior branch of this has a roughly 30% lesion.  The septal     perforator dislocation has a 40% lesion.  There is also much larger     second diagonal branch in the mid part of the portion of the     vessel, which has no significant disease.  The remainder of the LAD     has no significant disease and gives rise to several septal     perforators. 3. The circumflex artery is a large vessel, which bifurcates into a     first obtuse marginal branch, which bifurcates and then is a     atrioventricular groove artery, which looks apparently to be a     posterolateral branch.  The circumflex artery has no significant     disease in the entire system. 4. The right coronary artery is a dominant vessel, which has a     shepherd's crook configuration.  This gives rise to 2  RV marginal     branches and bifurcates into a large posterior descending artery     and a small posterolateral branch and dominant vessel, no evidence     of disease in the right coronary system.  IMPRESSION: 1. No significant coronary artery disease, nothing to explain the     potential perfusion defect in the nuclear perfusion test. 2. Mildly increased left ventricular diastolic pressure after     hydration. 3. Mildly decreased left ventricular ejection fraction with mostly     global hypokinesis consistent with echocardiographic evaluation.  PLAN: 1. Routine postradial care and likely discharge today from the radial     lounge. 2. Based on this outcome, the recommendation will be  to amend the     preoperative assessment in order to proceed     with the plan of cardiac surgery with he knowledge that the     ejection fraction is lower.  From a coronary standpoint risk, she is      low risk.  She will follow up with me in roughly 1 week post catheterization.          ______________________________ Landry Corporal, MD     DWH/MEDQ  D:  09/28/2010  T:  09/29/2010  Job:  [id number]  cc:   [facility] Pierce Crane, MD Chevis Pretty, MD Bennett Scrape, MD  Electronically Signed by Bryan Lemma MD on 09/29/2010 07:23:29 AM

## 2010-09-30 NOTE — Letter (Signed)
Summary: [facility]  [facility]   Imported By: Sherian Rein 08/10/2010 07:41:32  _____________________________________________________________________  External Attachment:    Type:   Image     Comment:   External Document

## 2010-09-30 NOTE — Letter (Signed)
Summary: [facility]  [facility]   Imported By: Sherian Rein 08/25/2010 07:38:33  _____________________________________________________________________  External Attachment:    Type:   Image     Comment:   External Document

## 2010-09-30 NOTE — Letter (Signed)
[facility]  [facility]   Imported By: Sherian Rein 08/18/2010 08:03:31  _____________________________________________________________________  External Attachment:    Type:   Image     Comment:   External Document

## 2010-10-06 NOTE — Letter (Signed)
Summary: [facility]  [facility]   Imported By: Marylou Mccoy 09/24/2010 09:56:48  _____________________________________________________________________  External Attachment:    Type:   Image     Comment:   External Document

## 2010-10-14 ENCOUNTER — Encounter (HOSPITAL_BASED_OUTPATIENT_CLINIC_OR_DEPARTMENT_OTHER): Payer: Medicare Other | Admitting: Oncology

## 2010-10-14 ENCOUNTER — Other Ambulatory Visit: Payer: Self-pay | Admitting: Oncology

## 2010-10-14 ENCOUNTER — Other Ambulatory Visit: Payer: Self-pay | Admitting: Physician Assistant

## 2010-10-14 ENCOUNTER — Encounter: Payer: Self-pay | Admitting: Cardiovascular Disease

## 2010-10-14 DIAGNOSIS — C50919 Malignant neoplasm of unspecified site of unspecified female breast: Secondary | ICD-10-CM

## 2010-10-14 DIAGNOSIS — Z5111 Encounter for antineoplastic chemotherapy: Secondary | ICD-10-CM

## 2010-10-14 DIAGNOSIS — Z170 Estrogen receptor positive status [ER+]: Secondary | ICD-10-CM

## 2010-10-14 LAB — CBC WITH DIFFERENTIAL/PLATELET
BASO%: 0.3 % (ref 0.0–2.0)
LYMPH%: 38.5 % (ref 14.0–49.7)
MCHC: 32.3 g/dL (ref 31.5–36.0)
MONO#: 0.6 10*3/uL (ref 0.1–0.9)
RBC: 2.78 10*6/uL — ABNORMAL LOW (ref 3.70–5.45)
RDW: 15.4 % — ABNORMAL HIGH (ref 11.2–14.5)
WBC: 3.5 10*3/uL — ABNORMAL LOW (ref 3.9–10.3)
lymph#: 1.3 10*3/uL (ref 0.9–3.3)

## 2010-10-14 LAB — COMPREHENSIVE METABOLIC PANEL
ALT: 12 U/L (ref 0–35)
CO2: 26 mEq/L (ref 19–32)
Sodium: 138 mEq/L (ref 135–145)
Total Bilirubin: 0.5 mg/dL (ref 0.3–1.2)
Total Protein: 6.5 g/dL (ref 6.0–8.3)

## 2010-10-15 ENCOUNTER — Encounter (HOSPITAL_BASED_OUTPATIENT_CLINIC_OR_DEPARTMENT_OTHER): Payer: Medicare Other | Admitting: Oncology

## 2010-10-15 DIAGNOSIS — Z170 Estrogen receptor positive status [ER+]: Secondary | ICD-10-CM

## 2010-10-15 DIAGNOSIS — C50919 Malignant neoplasm of unspecified site of unspecified female breast: Secondary | ICD-10-CM

## 2010-10-20 NOTE — Letter (Signed)
Summary: [facility]: Office Progress Note  [facility]: Office Progress Note   Imported By: Earl Many 10/13/2010 09:06:57  _____________________________________________________________________  External Attachment:    Type:   Image     Comment:   External Document

## 2010-10-20 NOTE — Progress Notes (Signed)
Summary: [facility]: Office Visit  [facility]: Office Visit   Imported By: Earl Many 10/12/2010 18:32:32  _____________________________________________________________________  External Attachment:    Type:   Image     Comment:   External Document

## 2010-10-21 ENCOUNTER — Other Ambulatory Visit: Payer: Self-pay | Admitting: Physician Assistant

## 2010-10-21 ENCOUNTER — Encounter: Payer: Self-pay | Admitting: Cardiovascular Disease

## 2010-10-21 ENCOUNTER — Encounter (HOSPITAL_BASED_OUTPATIENT_CLINIC_OR_DEPARTMENT_OTHER): Payer: Medicare Other | Admitting: Oncology

## 2010-10-21 DIAGNOSIS — C50919 Malignant neoplasm of unspecified site of unspecified female breast: Secondary | ICD-10-CM

## 2010-10-21 DIAGNOSIS — Z170 Estrogen receptor positive status [ER+]: Secondary | ICD-10-CM

## 2010-10-21 LAB — CBC WITH DIFFERENTIAL/PLATELET
BASO%: 0.4 % (ref 0.0–2.0)
Basophils Absolute: 0 10*3/uL (ref 0.0–0.1)
EOS%: 0.1 % (ref 0.0–7.0)
HGB: 9.4 g/dL — ABNORMAL LOW (ref 11.6–15.9)
MCH: 32.5 pg (ref 25.1–34.0)
MCHC: 32.4 g/dL (ref 31.5–36.0)
MCV: 100.3 fL (ref 79.5–101.0)
MONO%: 8.3 % (ref 0.0–14.0)
RDW: 14.3 % (ref 11.2–14.5)
lymph#: 1.6 10*3/uL (ref 0.9–3.3)

## 2010-10-22 ENCOUNTER — Ambulatory Visit (HOSPITAL_COMMUNITY)
Admission: RE | Admit: 2010-10-22 | Discharge: 2010-10-22 | Disposition: A | Discharge status: Home / Self Care | Payer: Medicare Other | Source: Ambulatory Visit | Attending: Oncology | Admitting: Oncology

## 2010-10-22 DIAGNOSIS — R222 Localized swelling, mass and lump, trunk: Secondary | ICD-10-CM | POA: Insufficient documentation

## 2010-10-22 DIAGNOSIS — Z9221 Personal history of antineoplastic chemotherapy: Secondary | ICD-10-CM | POA: Insufficient documentation

## 2010-10-22 DIAGNOSIS — C50919 Malignant neoplasm of unspecified site of unspecified female breast: Secondary | ICD-10-CM | POA: Insufficient documentation

## 2010-10-22 MED ORDER — GADOBENATE DIMEGLUMINE 529 MG/ML IV SOLN: 20.0000 mL | Freq: Once | INTRAVENOUS | Status: AC | PRN

## 2010-10-26 NOTE — Progress Notes (Signed)
Summary: [facility]: Office Progress Note  [facility]: Office Progress Note   Imported By: Earl Many 10/20/2010 08:49:14  _____________________________________________________________________  External Attachment:    Type:   Image     Comment:   External Document

## 2010-11-09 NOTE — Progress Notes (Signed)
Summary: [facility]: Office Visit  [facility]: Office Visit   Imported By: Earl Many 11/01/2010 09:52:24  _____________________________________________________________________  External Attachment:    Type:   Image     Comment:   External Document

## 2010-11-10 LAB — BASIC METABOLIC PANEL
BUN: 10 mg/dL (ref 6–23)
CO2: 31 mEq/L (ref 19–32)
Calcium: 9.4 mg/dL (ref 8.4–10.5)
Chloride: 103 mEq/L (ref 96–112)
Creatinine, Ser: 0.83 mg/dL (ref 0.4–1.2)
GFR calc Af Amer: >60 mL/min (ref >60)

## 2010-11-10 LAB — CBC
MCH: 29.6 pg (ref 26.0–34.0)
MCV: 91.2 fL (ref 78.0–100.0)
Platelets: 268 10*3/uL (ref 150–400)
RBC: 3.85 MIL/uL — ABNORMAL LOW (ref 3.87–5.11)
RDW: 12.6 % (ref 11.5–15.5)
WBC: 5.5 10*3/uL (ref 4.0–10.5)

## 2010-11-10 LAB — GLUCOSE, CAPILLARY: Glucose-Capillary: 95 mg/dL (ref 70–99)

## 2010-11-10 LAB — SURGICAL PCR SCREEN: Staphylococcus aureus: NEGATIVE

## 2010-11-11 LAB — CBC
MCH: 31.8 pg (ref 26.0–34.0)
MCV: 93.6 fL (ref 78.0–100.0)
Platelets: 228 10*3/uL (ref 150–400)
RDW: 11.9 % (ref 11.5–15.5)

## 2010-11-11 LAB — POCT I-STAT, CHEM 8
BUN: 15 mg/dL (ref 6–23)
Chloride: 103 mEq/L (ref 96–112)
Potassium: 3.9 mEq/L (ref 3.5–5.1)
Sodium: 144 mEq/L (ref 135–145)
TCO2: 30 mmol/L (ref 0–100)

## 2010-11-11 LAB — HEPATIC FUNCTION PANEL
Albumin: 3.4 g/dL — ABNORMAL LOW (ref 3.5–5.2)
Total Protein: 7.8 g/dL (ref 6.0–8.3)

## 2010-11-11 LAB — POCT CARDIAC MARKERS
CKMB, poc: 1.6 ng/mL (ref 1.0–8.0)
CKMB, poc: 1.8 ng/mL (ref 1.0–8.0)
Myoglobin, poc: 70.2 ng/mL (ref 12–200)
Troponin i, poc: 0.06 ng/mL (ref 0.00–0.09)

## 2010-11-11 LAB — DIFFERENTIAL
Basophils Absolute: 0 10*3/uL (ref 0.0–0.1)
Basophils Relative: 1 % (ref 0–1)
Eosinophils Absolute: 0.1 10*3/uL (ref 0.0–0.7)
Eosinophils Relative: 2 % (ref 0–5)
Neutrophils Relative %: 69 % (ref 43–77)

## 2010-11-16 NOTE — Progress Notes (Signed)
Summary: [facility]: Office Visit  [facility]: Office Visit   Imported By: Earl Many 11/05/2010 14:16:51  _____________________________________________________________________  External Attachment:    Type:   Image     Comment:   External Document

## 2010-11-23 ENCOUNTER — Encounter (HOSPITAL_BASED_OUTPATIENT_CLINIC_OR_DEPARTMENT_OTHER): Payer: Medicare Other | Admitting: Oncology

## 2010-11-23 ENCOUNTER — Other Ambulatory Visit: Payer: Self-pay | Admitting: Oncology

## 2010-11-23 ENCOUNTER — Other Ambulatory Visit: Payer: Self-pay | Admitting: Physician Assistant

## 2010-11-23 DIAGNOSIS — Z170 Estrogen receptor positive status [ER+]: Secondary | ICD-10-CM

## 2010-11-23 DIAGNOSIS — C50919 Malignant neoplasm of unspecified site of unspecified female breast: Secondary | ICD-10-CM

## 2010-11-23 LAB — CBC WITH DIFFERENTIAL/PLATELET
Basophils Absolute: 0 10*3/uL (ref 0.0–0.1)
Eosinophils Absolute: 0 10*3/uL (ref 0.0–0.5)
HGB: 9.2 g/dL — ABNORMAL LOW (ref 11.6–15.9)
MCV: 98.6 fL (ref 79.5–101.0)
MONO#: 0.4 10*3/uL (ref 0.1–0.9)
NEUT#: 1.6 10*3/uL (ref 1.5–6.5)
RBC: 2.89 10*6/uL — ABNORMAL LOW (ref 3.70–5.45)
RDW: 13.3 % (ref 11.2–14.5)
WBC: 3.2 10*3/uL — ABNORMAL LOW (ref 3.9–10.3)
lymph#: 1.2 10*3/uL (ref 0.9–3.3)
nRBC: 0 % (ref 0–0)

## 2010-11-23 LAB — COMPREHENSIVE METABOLIC PANEL
ALT: 12 U/L (ref 0–35)
Albumin: 3.7 g/dL (ref 3.5–5.2)
CO2: 29 mEq/L (ref 19–32)
Chloride: 104 mEq/L (ref 96–112)
Glucose, Bld: 108 mg/dL — ABNORMAL HIGH (ref 70–99)
Potassium: 4.2 mEq/L (ref 3.5–5.3)
Sodium: 140 mEq/L (ref 135–145)
Total Protein: 7 g/dL (ref 6.0–8.3)

## 2010-11-23 LAB — CANCER ANTIGEN 27.29: CA 27.29: 22 U/mL (ref 0–39)

## 2010-11-26 ENCOUNTER — Encounter (HOSPITAL_COMMUNITY)
Admission: RE | Admit: 2010-11-26 | Discharge: 2010-11-26 | Disposition: A | Discharge status: Home / Self Care | Payer: Medicare Other | Source: Ambulatory Visit | Attending: Oncology | Admitting: Oncology

## 2010-11-26 ENCOUNTER — Encounter (HOSPITAL_COMMUNITY): Payer: Self-pay

## 2010-11-26 DIAGNOSIS — I2510 Atherosclerotic heart disease of native coronary artery without angina pectoris: Secondary | ICD-10-CM | POA: Insufficient documentation

## 2010-11-26 DIAGNOSIS — C50919 Malignant neoplasm of unspecified site of unspecified female breast: Secondary | ICD-10-CM | POA: Insufficient documentation

## 2010-11-26 DIAGNOSIS — K828 Other specified diseases of gallbladder: Secondary | ICD-10-CM | POA: Insufficient documentation

## 2010-11-26 DIAGNOSIS — E049 Nontoxic goiter, unspecified: Secondary | ICD-10-CM | POA: Insufficient documentation

## 2010-11-26 HISTORY — DX: Malignant (primary) neoplasm, unspecified: C80.1

## 2010-11-26 LAB — GLUCOSE, CAPILLARY: Glucose-Capillary: 101 mg/dL — ABNORMAL HIGH (ref 70–99)

## 2010-11-26 MED ORDER — FLUDEOXYGLUCOSE F - 18 (FDG) INJECTION
18.1000 | Freq: Once | INTRAVENOUS | Status: AC | PRN
  Administered 2010-11-26: 18.1 via INTRAVENOUS

## 2010-11-29 ENCOUNTER — Ambulatory Visit (HOSPITAL_COMMUNITY)
Admission: RE | Admit: 2010-11-29 | Discharge: 2010-11-29 | Disposition: A | Discharge status: Home / Self Care | Payer: Medicare Other | Source: Ambulatory Visit | Attending: Oncology | Admitting: Oncology

## 2010-11-29 ENCOUNTER — Encounter (HOSPITAL_COMMUNITY)
Admission: RE | Admit: 2010-11-29 | Discharge: 2010-11-29 | Disposition: A | Discharge status: Home / Self Care | Payer: Medicare Other | Source: Ambulatory Visit | Attending: General Surgery | Admitting: General Surgery

## 2010-11-29 ENCOUNTER — Other Ambulatory Visit: Payer: Self-pay | Admitting: Oncology

## 2010-11-29 ENCOUNTER — Encounter (HOSPITAL_COMMUNITY): Payer: Self-pay

## 2010-11-29 DIAGNOSIS — C50919 Malignant neoplasm of unspecified site of unspecified female breast: Secondary | ICD-10-CM | POA: Insufficient documentation

## 2010-11-29 DIAGNOSIS — I77811 Abdominal aortic ectasia: Secondary | ICD-10-CM | POA: Insufficient documentation

## 2010-11-29 DIAGNOSIS — K828 Other specified diseases of gallbladder: Secondary | ICD-10-CM | POA: Insufficient documentation

## 2010-11-29 HISTORY — DX: Essential (primary) hypertension: I10

## 2010-11-29 HISTORY — DX: Malignant neoplasm of unspecified site of unspecified female breast: C50.919

## 2010-11-29 LAB — CBC
HCT: 28.9 % — ABNORMAL LOW (ref 36.0–46.0)
MCHC: 32.2 g/dL (ref 30.0–36.0)
MCHC: 32.5 g/dL (ref 30.0–36.0)
MCV: 97 fL (ref 78.0–100.0)
Platelets: 174 10*3/uL (ref 150–400)
Platelets: 217 10*3/uL (ref 150–400)
RDW: 12.8 % (ref 11.5–15.5)
RDW: 13.1 % (ref 11.5–15.5)
WBC: 4 10*3/uL (ref 4.0–10.5)

## 2010-11-29 LAB — DIFFERENTIAL
Basophils Absolute: 0 10*3/uL (ref 0.0–0.1)
Eosinophils Absolute: 0 10*3/uL (ref 0.0–0.7)
Eosinophils Relative: 1 % (ref 0–5)
Lymphocytes Relative: 27 % (ref 12–46)
Monocytes Absolute: 0.3 10*3/uL (ref 0.1–1.0)

## 2010-11-29 LAB — COMPREHENSIVE METABOLIC PANEL
ALT: 18 U/L (ref 0–35)
AST: 20 U/L (ref 0–37)
Albumin: 3.4 g/dL — ABNORMAL LOW (ref 3.5–5.2)
Albumin: 3 g/dL — ABNORMAL LOW (ref 3.5–5.2)
BUN: 16 mg/dL (ref 6–23)
Calcium: 9 mg/dL (ref 8.4–10.5)
Calcium: 8.5 mg/dL (ref 8.4–10.5)
GFR calc Af Amer: >60 mL/min (ref >60)
Glucose, Bld: 98 mg/dL (ref 70–99)
Potassium: 5.1 mEq/L (ref 3.5–5.1)
Potassium: 4.6 mEq/L (ref 3.5–5.1)
Sodium: 136 mEq/L (ref 135–145)
Sodium: 138 mEq/L (ref 135–145)
Total Protein: 6.7 g/dL (ref 6.0–8.3)
Total Protein: 6.7 g/dL (ref 6.0–8.3)

## 2010-11-29 LAB — SURGICAL PCR SCREEN
MRSA, PCR: NEGATIVE
Staphylococcus aureus: NEGATIVE

## 2010-11-29 LAB — TYPE AND SCREEN
ABO/RH(D): A POS
Antibody Screen: NEGATIVE

## 2010-11-29 MED ORDER — IOHEXOL 300 MG/ML  SOLN
100.0000 mL | Freq: Once | INTRAMUSCULAR | Status: AC | PRN
  Administered 2010-11-29: 100 mL via INTRAVENOUS

## 2010-11-29 MED ORDER — GADOBENATE DIMEGLUMINE 529 MG/ML IV SOLN
20.0000 mL | Freq: Once | INTRAVENOUS | Status: AC | PRN
  Administered 2010-11-29: 20 mL via INTRAVENOUS

## 2010-11-30 LAB — BASIC METABOLIC PANEL
CO2: 30 mEq/L (ref 19–32)
Chloride: 104 mEq/L (ref 96–112)
Creatinine, Ser: 0.92 mg/dL (ref 0.4–1.2)
GFR calc Af Amer: >60 mL/min (ref >60)
Glucose, Bld: 105 mg/dL — ABNORMAL HIGH (ref 70–99)

## 2010-12-01 ENCOUNTER — Observation Stay (HOSPITAL_COMMUNITY)
Admission: RE | Admit: 2010-12-01 | Discharge: 2010-12-03 | Disposition: A | Discharge status: Home / Self Care | Payer: Medicare Other | Source: Ambulatory Visit | Attending: General Surgery | Admitting: General Surgery

## 2010-12-01 ENCOUNTER — Other Ambulatory Visit: Payer: Self-pay | Admitting: General Surgery

## 2010-12-01 ENCOUNTER — Other Ambulatory Visit (HOSPITAL_COMMUNITY): Payer: Medicare Other

## 2010-12-01 DIAGNOSIS — C50919 Malignant neoplasm of unspecified site of unspecified female breast: Principal | ICD-10-CM | POA: Insufficient documentation

## 2010-12-01 DIAGNOSIS — Z01812 Encounter for preprocedural laboratory examination: Secondary | ICD-10-CM | POA: Insufficient documentation

## 2010-12-01 DIAGNOSIS — I10 Essential (primary) hypertension: Secondary | ICD-10-CM | POA: Insufficient documentation

## 2010-12-01 HISTORY — PX: BREAST SURGERY: SHX581

## 2010-12-02 LAB — CBC
MCHC: 32.5 g/dL (ref 30.0–36.0)
MCV: 94.2 fL (ref 78.0–100.0)
RBC: 3.9 MIL/uL (ref 3.87–5.11)
RDW: 13 % (ref 11.5–15.5)

## 2010-12-06 LAB — CULTURE, ROUTINE-ABSCESS

## 2010-12-10 NOTE — Op Note (Signed)
NAMEMARIANA, Jeanette Morris               ACCOUNT NO.:  [id number]  MEDICAL RECORD NO.:  [id number]           PATIENT TYPE:  O  LOCATION:  5153                         FACILITY:  [facility]  PHYSICIAN:  Ollen Gross. Vernell Morgans, M.D. DATE OF BIRTH:  Jun 11, 1946  DATE OF PROCEDURE:  12/01/2010 DATE OF DISCHARGE:                              OPERATIVE REPORT   PREOPERATIVE DIAGNOSIS:  Locally advanced right breast cancer.  POSTOPERATIVE DIAGNOSIS:  Locally advanced right breast cancer.  PROCEDURE:  Right mastectomy and axillary node dissection.  SURGEON:  Ollen Gross. Vernell Morgans, MD  ASSISTANT:  Dr. Daphine Deutscher.  ANESTHESIA:  General endotracheal.  PROCEDURE:  After informed consent was obtained, the patient was brought to the operating room, placed in supine position on the operating room table.  After adequate induction of general anesthesia, the patient's right chest, breast and axillary area were all prepped with ChloraPrep, allowed to dry and draped in usual sterile manner.  An elliptical incision was mapped out around the nipple-areolar complex to minimize the excess skin.  This incision was made along the mapped outlines with a 10 blade knife.  This incision was carried down through the skin and subcutaneous tissue sharply with electrocautery and once into the breast tissue, skin hooks were used to elevate the skin flaps anteriorly towards the ceiling.  Skin flaps were then created between the breast tissue and subcutaneous fat.  This was all done sharply with electrocautery and the dissection was carried along the skin flaps circumferentially until the dissection reached the chest wall laterally. The dissection was carried out to the latissimus muscle.  Once this was accomplished, then the breast was removed from the chest wall with the pectoralis fascia.  This was also done sharply with electrocautery. Once this dissection was carried out lateral to the axilla, then we dissected in the axilla.   Unfortunately, the axilla was extremely densely stuck with fibrous-type tissue.  We were able to identify the axillary vein, and the long thoracic and thoracodorsal nerves, and we were able to spare these.  The tissue in between these landmarks was then dissected free by combination of blunt right angle dissection and some sharp dissection with electrocautery.  Once this tissue was removed, the breast and axillary contents were removed on block from the patient.  Stitch was used to mark the lateral aspect of the breast and it was sent to pathology for further evaluation.  Hemostasis was achieved using Bovie electrocautery.  The wound was irrigated with copious amounts of saline.  Two small stab incisions were made in the anterior axillary line.  Beneath the incision, tonsil clamp was placed through each of these openings into the operative bed and used to bring 19-French round Blake drain into the operative bed.  The lateral drain was placed in the axilla.  The medial drain was placed along the chest wall.  The drains were anchored to the skin with a 3-0 nylon stitch. The subcutaneous tissue from the superior and inferior flap were then grossly reapproximated with interrupted 3-0 Vicryl stitches and the skin was closed with staples.  Sterile dressings were applied.  The  drains were placed to bulb suction.  There was a good seal.  The patient tolerated the procedure well.  At then end of the case, all needle, sponge, and instrument counts were correct.  The patient was awakened, taken to recovery room in stable condition.     Ollen Gross. Vernell Morgans, M.D.     PST/MEDQ  D:  12/01/2010  T:  12/02/2010  Job:  [id number]  Electronically Signed by Chevis Pretty III M.D. on 12/10/2010 07:01:12 AM

## 2010-12-21 ENCOUNTER — Other Ambulatory Visit: Payer: Self-pay | Admitting: Oncology

## 2010-12-21 ENCOUNTER — Encounter (HOSPITAL_BASED_OUTPATIENT_CLINIC_OR_DEPARTMENT_OTHER): Payer: Medicare Other | Admitting: Oncology

## 2010-12-21 DIAGNOSIS — Z5111 Encounter for antineoplastic chemotherapy: Secondary | ICD-10-CM

## 2010-12-21 DIAGNOSIS — Z170 Estrogen receptor positive status [ER+]: Secondary | ICD-10-CM

## 2010-12-21 DIAGNOSIS — C50919 Malignant neoplasm of unspecified site of unspecified female breast: Secondary | ICD-10-CM

## 2010-12-21 LAB — COMPREHENSIVE METABOLIC PANEL
ALT: 11 U/L (ref 0–35)
Albumin: 3.5 g/dL (ref 3.5–5.2)
Alkaline Phosphatase: 51 U/L (ref 39–117)
Potassium: 4.2 mEq/L (ref 3.5–5.3)
Sodium: 139 mEq/L (ref 135–145)
Total Bilirubin: 0.5 mg/dL (ref 0.3–1.2)
Total Protein: 7 g/dL (ref 6.0–8.3)

## 2010-12-21 LAB — CBC WITH DIFFERENTIAL/PLATELET
BASO%: 0 % (ref 0.0–2.0)
LYMPH%: 16.4 % (ref 14.0–49.7)
MCHC: 33.1 g/dL (ref 31.5–36.0)
MCV: 97.9 fL (ref 79.5–101.0)
MONO#: 0.7 10*3/uL (ref 0.1–0.9)
MONO%: 9.6 % (ref 0.0–14.0)
NEUT#: 5.4 10*3/uL (ref 1.5–6.5)
Platelets: 189 10*3/uL (ref 145–400)
RBC: 2.7 10*6/uL — ABNORMAL LOW (ref 3.70–5.45)
RDW: 13.6 % (ref 11.2–14.5)
WBC: 7.4 10*3/uL (ref 3.9–10.3)

## 2010-12-27 ENCOUNTER — Ambulatory Visit: Payer: Medicare Other | Attending: Radiation Oncology | Admitting: Radiation Oncology

## 2010-12-27 DIAGNOSIS — C50919 Malignant neoplasm of unspecified site of unspecified female breast: Secondary | ICD-10-CM | POA: Insufficient documentation

## 2010-12-27 DIAGNOSIS — Z510 Encounter for antineoplastic radiation therapy: Secondary | ICD-10-CM | POA: Insufficient documentation

## 2010-12-27 DIAGNOSIS — C770 Secondary and unspecified malignant neoplasm of lymph nodes of head, face and neck: Secondary | ICD-10-CM | POA: Insufficient documentation

## 2010-12-27 DIAGNOSIS — L538 Other specified erythematous conditions: Secondary | ICD-10-CM | POA: Insufficient documentation

## 2010-12-27 DIAGNOSIS — Z90710 Acquired absence of both cervix and uterus: Secondary | ICD-10-CM | POA: Insufficient documentation

## 2010-12-27 DIAGNOSIS — Z79899 Other long term (current) drug therapy: Secondary | ICD-10-CM | POA: Insufficient documentation

## 2010-12-27 DIAGNOSIS — Z7982 Long term (current) use of aspirin: Secondary | ICD-10-CM | POA: Insufficient documentation

## 2010-12-27 DIAGNOSIS — C773 Secondary and unspecified malignant neoplasm of axilla and upper limb lymph nodes: Secondary | ICD-10-CM | POA: Insufficient documentation

## 2010-12-27 DIAGNOSIS — I10 Essential (primary) hypertension: Secondary | ICD-10-CM | POA: Insufficient documentation

## 2011-01-06 ENCOUNTER — Ambulatory Visit (HOSPITAL_COMMUNITY)
Admission: RE | Admit: 2011-01-06 | Discharge: 2011-01-06 | Disposition: A | Discharge status: Home / Self Care | Payer: Medicare Other | Source: Ambulatory Visit | Attending: Oncology | Admitting: Oncology

## 2011-01-06 DIAGNOSIS — Z6841 Body Mass Index (BMI) 40.0 and over, adult: Secondary | ICD-10-CM | POA: Insufficient documentation

## 2011-01-06 DIAGNOSIS — E669 Obesity, unspecified: Secondary | ICD-10-CM | POA: Insufficient documentation

## 2011-01-06 DIAGNOSIS — C50919 Malignant neoplasm of unspecified site of unspecified female breast: Secondary | ICD-10-CM | POA: Insufficient documentation

## 2011-01-06 DIAGNOSIS — I059 Rheumatic mitral valve disease, unspecified: Secondary | ICD-10-CM | POA: Insufficient documentation

## 2011-01-06 DIAGNOSIS — I10 Essential (primary) hypertension: Secondary | ICD-10-CM | POA: Insufficient documentation

## 2011-01-11 NOTE — Op Note (Signed)
[name], Jeanette Morris               ACCOUNT NO.:  [id number]   MEDICAL RECORD NO.:  [id number]          PATIENT TYPE:  AMB   LOCATION:  SDS                          FACILITY:  [facility]   PHYSICIAN:  Ollen Gross. Vernell Morgans, M.D. DATE OF BIRTH:  01-23-1946   DATE OF PROCEDURE:  10/18/2007  DATE OF DISCHARGE:                               OPERATIVE REPORT   PREOPERATIVE DIAGNOSIS:  Right breast papilloma.   POSTOPERATIVE DIAGNOSIS:  Right breast papilloma.   PROCEDURE:  Right breast needle localized lumpectomy.   SURGEON:  Ollen Gross. Vernell Morgans, M.D.   ANESTHESIA:  General via LMA.   PROCEDURE IN DETAIL:  After informed consent was obtained, the patient  was brought to the operating room and placed in the supine position on  the operating table.  After adequate induction of general anesthesia,  the patient's right breast was prepped with Betadine and draped in the  usual sterile manner.  Earlier in the day, the patient had undergone a  wire localization procedure.  The wire was entering the right breast  superiorly and aiming towards the nipple and areola.  A curvilinear  incision was made just above the areola on the upper portion of breast  with a 15 blade knife.  This incision was carried down through the skin  and subcutaneous tissue sharply with electrocautery until the breast  tissue was entered.  Dissection was carried down to the level of the  wire that could be palpated.  A circular portion of breast tissue was  removed, around the tip of the wire.  This was done sharply with  electrocautery.  Once the specimen was removed, it was oriented with a  short stitch superior, long stitch lateral, and sent for specimen  radiograph and then to pathology for further evaluation.  Specimen  radiograph showed the clip to be within the specimen.  The wound was  then infiltrated with 0.25% Marcaine.  Hemostasis was achieved using  Bovie electrocautery.  The wound was then irrigated with copious  amounts  of saline.  The deep layer of the wound was then closed with interrupted  3-0 Vicryl stitches and the skin was closed with a running 4-0 Monocryl  subcuticular stitch.  A Dermabond dressing was applied.  The patient  tolerated the procedure well.  At the end of the case, all needle,  sponge, and instrument counts were correct.  The patient was awakened  and taken to recovery in stable condition.      Ollen Gross. Vernell Morgans, M.D.  Electronically Signed     PST/MEDQ  D:  10/18/2007  T:  10/19/2007  Job:  [id number]

## 2011-01-14 NOTE — H&P (Signed)
[facility]  Patient:    Jeanette Morris, Jeanette Morris Visit Number: [id number] MRN: [id number]          Service Type: EMS Location: ED Attending Physician:  Donnetta Hutching Dictated by:   Tollie Pizza. Collins, P.A.-C. Admit Date:  10/01/2001 Discharge Date: 10/01/2001   CC:         Crista Curb, M.D.  Valentino Hue. Magrinat, M.D.   History and Physical  DATE OF BIRTH:  07-06-1946  CHIEF COMPLAINT:  Mediastinal adenopathy.  HISTORY OF PRESENT ILLNESS:  The patient is a 65 year old black female without significant past medical history who was seen in the ER at [facility] recently with complaints of lower extremity edema and some shortness of breath.  She underwent a complete workup including cardiac evaluation which was negative.  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  She was referred to Dr. Edwyna Shell for further evaluation and treatment.  She has had a slight cough which has been nonproductive.  She denies any fever, chills, or weight loss.  She was mildly short of breath at the time of her visit to the ER but has been on Lasix since that time, and her shortness of breath has greatly improved.  Her peripheral edema has also almost completely resolved.  She has otherwise been asymptomatic.  It is felt she should undergo bronchoscopy and mediastinoscopy for diagnosis.  PAST MEDICAL HISTORY:  She has a history of hypertension but has been on no medications.  Otherwise she denies history of coronary artery disease, diabetes mellitus, renal insufficiency, previous thyroid disease, anemia, COPD, stroke, or cancer.  PAST SURGICAL HISTORY:  Bilateral tubal ligation.  CURRENT MEDICATIONS:  She is finishing a weeks course of Lasix today which was given to her in the ER.  Otherwise she takes no medication.  ALLERGIES:   No known drug allergies.  REVIEW OF SYSTEMS:  She still has some difficulty with lower extremity edema, although this has greatly improved with diuresis.  She denies any fevers, chills, weight loss, chest pain, shortness of breath, dyspnea on exertion, paroxysmal nocturnal dyspnea, nausea, vomiting, diarrhea, constipation, reflux, dysuria, joint pain, claudication symptoms, headaches, dizziness, TIA symptoms, depression, or anxiety.  FAMILY HISTORY:  Her mother died of Alzheimers disease.  Her father died of a myocardial infarction.  She has one sister who is hypertensive.  She also has a son who has diabetes mellitus.  SOCIAL HISTORY:  She is married and has seven children.  She is currently unemployed.  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  She denies any alcohol use.  She reports that when she was much younger, she smoked socially, mainly on the weekends, and her tobacco use averaged probably less than one pack of cigarettes every one to two months.  She quit smoking altogether in 1974, although her husband is a smoker, and she is exposed to secondhand smoke in this manner.  PHYSICAL EXAMINATION:  VITAL SIGNS:  Blood pressure 142/84, pulse 114 but regular, respirations 18 and unlabored.  GENERAL:  This is a well-developed, well-nourished, obese black female who is in no acute distress.  HEENT:  Normocephalic, atraumatic.  Pupils are equal, round and reactive to light and accommodation.  Extraocular movements intact.  TMs and canals are clear.  Nares patent bilaterally.  Posterior pharynx was clear.  Teeth are in fair repair.  NECK:  Supple with no lymphadenopathy, JVD, or bruits.  She has symmetrical enlargement of the thyroid.  LUNGS:  Clear to auscultation.  HEART:  Regular rate and rhythm without murmurs, rubs, or gallops, although she is quite tachycardic.  ABDOMEN:  Obese, soft, nontender,  nondistended.  No masses or hepatosplenomegaly.  Bowel sounds present and active in all quadrants.  EXTREMITIES:  No clubbing, cyanosis, and only mild edema bilaterally.  Feet are warm and well perfused.  Dorsalis pedis and posterior tibial pulses are 2+ and symmetrical bilaterally.  NEUROLOGIC:  Cranial nerves II-XII grossly intact.  She is alert and oriented x 3.  Gait is within normal limits.  Muscle strength is good and symmetrical bilaterally.  GENITAL/RECTAL:  Exams deferred at this time.  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  She is scheduled for bronchoscopy and mediastinoscopy to be performed by Dr. Edwyna Shell on October 18, 2001. Dictated by:   Tollie Pizza Collins, P.A.-C. Attending Physician:  Donnetta Hutching DD:  10/16/01 TD:  10/16/01 Job: [id number]

## 2011-01-14 NOTE — Cardiovascular Report (Signed)
NAME:  Jeanette Morris, Jeanette Morris NO.:  [id number]   MEDICAL RECORD NO.:  [id number]                   PATIENT TYPE:  OIB   LOCATION:  2856                                 FACILITY:  [facility]   PHYSICIAN:  Nicki Guadalajara, M.D.                  DATE OF BIRTH:  May 26, 1946   DATE OF PROCEDURE:  06/19/2003  DATE OF DISCHARGE:                              CARDIAC CATHETERIZATION   INDICATIONS:  Ms. [name] is a very pleasant 65 year old African-  American female who has a history of hypertension.  She was initially  referred to me for evaluation of chest pain.  She described her chest pain  as intermittent, left-sided squeezing chest pain which has somewhat  progressed.  She does have a history for morbid obesity as well as  hypertension and hyperlipidemia.  She was referred for a Persantine  Cardiolite study which raised the possibility of anterolateral wall  ischemia.  However, the patient is very large size.  Certainly, this could  be contributed by significant exacerbated breast attenuation with breast  shifting compared to the rest and stress images.  Ejection fraction was 51%.  She is now referred for definitive cardiac catheterization.   DESCRIPTION OF PROCEDURE:  After premedication with Valium 5 mg  intravenously, the patient was prepped and draped in the usual fashion.  Her  right femoral artery was punctured anteriorly and a 6 French sheath was  inserted.  Diagnostic catheterization was done with 6 French Judkins 4 left  coronary catheter and a No-Torque right catheter was needed for selective  engagement into the right coronary artery.  A pigtail catheter was used for  biplane cine left ventriculography.  With a hypertensive history, distal  aortography was also performed.  Hemostasis was obtained by direct manual  pressure.  The patient tolerated the procedure well.   HEMODYNAMIC DATA:  Central aortic pressure was 175/91, mean 130.  Left  ventricular  pressure 175/16.  Post A-wave 31.   ANGIOGRAPHIC DATA:  Left main coronary artery was angiographically normal  and bifurcated into an LAD and left circumflex system.   The LAD gave rise to two prominent diagonal vessels and several septal  perforating arteries and was angiographically normal.   The circumflex vessel was angiographically normal and gave rise to prominent  first marginal vessel and ended in a posterior lateral type vessel.   The right coronary artery had a shepherd's crook take off.  The right  coronary artery supplied the PDA vessel and was angiographically normal.   Biplane cine left ventriculography revealed normal LV contractility without  focal segmental wall motion abnormalities.  Ejection fraction is in the 50-  55% range.   Distal aortography did not demonstrate any evidence  for renal artery  stenosis.  There was no significant aortoiliac disease.   IMPRESSION:  1. Normal left ventricular function.  2.     Normal coronary  arteries.  3. No evidence for renal artery stenosis.  4. Normal aortoiliac system.                                                 Nicki Guadalajara, M.D.    TK/MEDQ  D:  06/19/2003  T:  06/19/2003  Job:  [id number]   cc:   Tresa Endo L. Philipp Deputy, M.D.  [phone] [address]  [place] [address]  Fax: [phone]   Orville Govern

## 2011-01-14 NOTE — Consult Note (Signed)
[place]. [facility]  Patient:    Jeanette Morris, Jeanette Morris Visit Number: [id number] MRN: [id number]          Service Type: SUR Location: 5700 [id number] Attending Physician:  Cameron Proud Dictated by:   Reather Littler, M.D. Admit Date:  10/18/2001   CC:         Norton Blizzard, M.D.   Consultation Report  REASON FOR CONSULTATION:  Goiter.  HISTORY OF PRESENT ILLNESS:  This is a 65 year old African-American female who was admitted for a mediastinoscopy about 2 days ago. Apparently, the patient had presented to the emergency room at [facility] earlier this month and was found to have a goiter. This was also evaluated with her chest CT scan which showed a substernal component also. The patient apparently has no prior history of thyroid disease. She does not know that she has a swelling in her neck, and she does not have any family doctor whom she goes to for any evaluations.  The patient has not noticed any difficulty swallowing. She has no choking sensation in any position when trying to breathe. She can lie down without any difficulty. She denies any history of recent weight loss. There is no history of palpitations except for occasional minor symptoms. She has no history of heat intolerance or sweating. No history to tremor or shakiness. No history of nervousness or diarrhea.  MEDICATIONS:  None.  PAST MEDICAL HISTORY:  Apparently has been told to have hypertension in the past.  ALLERGIES:  None.  FAMILY HISTORY:  Negative for thyroid disease. Positive for hypertension, diabetes, and myocardial infarction.  PERSONAL HISTORY:  She is a large smoker. She does not use alcohol. She is married.  REVIEW OF SYSTEMS:  She apparently has had mild hypertension but not on treatment. No history of diabetes. She was admitted to the emergency room with shortness of breath and some edema which was felt to be cardiac in origin. The patient has no GI symptoms  or any history of urinary symptoms. Does not have any symptoms of chest pain. There is no history of numbness of the feet.  PHYSICAL EXAMINATION:  GENERAL:  The patient is obese. She is alert and cooperative, and pleasant.  VITAL SIGNS:  Blood pressure was 160/80, pulse 96 and regular.  HEENT:  Her eyes are normal. The ENT exam is normal.  NECK:  She has a large suprasternal thyroid enlargement which measures about 5 cm across. It is smooth. It is slightly tender because of recent surgery. Lateral lobes are not palpable. There is no stridor over the sternum area.  HEART:  Heart sounds are normal.  LUNGS:  Clear.  ABDOMEN:  No tenderness.  EXTREMITIES:  No edema. Deep tendon reflexes are difficult to elicit.  LABORATORY DATA:  Her free T4 and free T3 done yesterday show only a mild increase in T4 but TSH was normal.  RECOMMENDATION:  Since the patient has normal TSH it is unlikely that she has hyperthyroidism. Mild increase in free T4 may be a lab error, also her previous T4 which was high maybe related to thyroxine binding globulin abnormalities. It would be difficult to do a radioactive iodine uptake and scan currently because of recent CT scan dye exposure.  Currently no treatment needed. The patient needs to be followed up as an outpatient with a primary physician who can repeat thyroid functions, and if abnormal may be referred for a radioactive iodine treatment. Dictated by:   Reather Littler, M.D.  Attending Physician:  Cameron Proud DD:  10/20/01 TD:  10/21/01 Job: [id number]

## 2011-01-14 NOTE — Discharge Summary (Signed)
[place]. [facility]  Patient:    Jeanette Morris, Jeanette Morris Visit Number: [id number] MRN: [id number]          Service Type: SUR Location: 5700 [id number] [id number] Attending Physician:  Cameron Proud Dictated by:   Lissa Merlin, P.A.-C Admit Date:  10/18/2001 Discharge Date: 10/23/2001   CC:         Dewayne Shorter, M.D.  Reather Littler, M.D.   Discharge Summary  DATE OF BIRTH:  1945/11/21  CONSULTATIONS: 1. Infectious disease, Dewayne Shorter, M.D. 2. Endocrinology, Reather Littler, M.D.  ADMISSION DIAGNOSIS:  Mediastinal adenopathy.  DISCHARGE DIAGNOSES: 1. Mediastinal adenopathy. 2. Tuberculosis. 3. Substernal thyroid goiter. 4. Hypertension.  BRIEF HISTORY:  The patient is a 65 year old black female with no significant past medical history who was seen at the [facility] Emergency Room recently with complaints of shortness of breath.  A workup included a CT scan which ruled out pulmonary embolus but showed significant mediastinal and hilar adenopathy as well as a substernal thyroid goiter.  She was referred to Dr. Edwyna Shell.  Dr. Edwyna Shell recommended fiberoptic bronchoscopy and mediastinoscopy.  This was discussed, and it was agreed to proceed.  HOSPITAL COURSE:  She came into the hospital for the elective procedure on October 18, 2001.  There were no complications.  She was found to have necrotizing granulomas compatible to tuberculosis.  Acid-fast stains were negative, and cultures were pending.  PPD was placed which showed a very impressive large area of bullae on her left forearm.  [name] was also seen by the medicine service because of her substernal thyroid goiter.  Endocrinology was consulted who recommended no further treatment at this time with further workup to be done as an outpatient.  By October 22, 2001, [name] was afebrile.  Vital signs were stable.  She was feeling well.  Her left forearm had a very impressive area of  bullae. Infectious disease recommended going home with standard five-drug treatment with followup in the health department.  She was discharged home October 23, 2001, in stable condition.  PROCEDURES:  Fiberoptic bronchoscopy and mediastinoscopy on October 26, 2001, by Dr. Edwyna Shell.  DISCHARGE MEDICATIONS: 1. INH 300 mg tablet, 1 tablet daily. 2. Rifampin 300 capsule, 2 capsules daily. 3. Ethambutol 400 mg tablet, 5 tablets daily. 4. Vitamin B6 50 mg 1 tablet daily. 5. Pyrazinamide 500 mg 4 tablets daily. 6. Vasotec 2.5 mg 1 daily. 7. Lopressor 50 mg tablet 1/2 tablet twice a day.  SPECIAL INSTRUCTIONS:  She was told to avoid strenuous activity and heavy lifting.  She was told to avoid driving until seen by Dr. Edwyna Shell.  She could shower.  She was told to get a chest x-ray at [facility] one hour before she saw Dr. Edwyna Shell and to bring it with her to see Dr. Edwyna Shell.  CONDITION UPON DISCHARGE:  Stable.  FOLLOWUP: 1. Dr. Edwyna Shell one week after discharge. 2. Health Department as directed. Dictated by:   Lissa Merlin, P.A.-C Attending Physician:  Cameron Proud DD:  11/26/01 TD:  11/26/01 Job: [id number]

## 2011-02-07 ENCOUNTER — Encounter (HOSPITAL_BASED_OUTPATIENT_CLINIC_OR_DEPARTMENT_OTHER): Payer: Medicare Other | Admitting: Oncology

## 2011-02-07 DIAGNOSIS — C773 Secondary and unspecified malignant neoplasm of axilla and upper limb lymph nodes: Secondary | ICD-10-CM

## 2011-02-07 DIAGNOSIS — Z853 Personal history of malignant neoplasm of breast: Secondary | ICD-10-CM

## 2011-02-07 DIAGNOSIS — Z170 Estrogen receptor positive status [ER+]: Secondary | ICD-10-CM

## 2011-02-24 ENCOUNTER — Ambulatory Visit (HOSPITAL_COMMUNITY)
Admission: RE | Admit: 2011-02-24 | Discharge: 2011-02-24 | Disposition: A | Discharge status: Home / Self Care | Payer: Medicare Other | Source: Ambulatory Visit | Attending: Oncology | Admitting: Oncology

## 2011-02-24 DIAGNOSIS — I10 Essential (primary) hypertension: Secondary | ICD-10-CM | POA: Insufficient documentation

## 2011-02-24 DIAGNOSIS — C50919 Malignant neoplasm of unspecified site of unspecified female breast: Secondary | ICD-10-CM | POA: Insufficient documentation

## 2011-02-24 DIAGNOSIS — I079 Rheumatic tricuspid valve disease, unspecified: Secondary | ICD-10-CM | POA: Insufficient documentation

## 2011-02-25 DIAGNOSIS — I369 Nonrheumatic tricuspid valve disorder, unspecified: Secondary | ICD-10-CM

## 2011-03-11 ENCOUNTER — Ambulatory Visit (INDEPENDENT_AMBULATORY_CARE_PROVIDER_SITE_OTHER): Payer: Medicare Other | Admitting: General Surgery

## 2011-03-11 ENCOUNTER — Encounter (INDEPENDENT_AMBULATORY_CARE_PROVIDER_SITE_OTHER): Payer: Self-pay | Admitting: General Surgery

## 2011-03-11 DIAGNOSIS — C50919 Malignant neoplasm of unspecified site of unspecified female breast: Secondary | ICD-10-CM
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
## 2011-03-25 ENCOUNTER — Other Ambulatory Visit: Payer: Self-pay | Admitting: Oncology

## 2011-03-25 ENCOUNTER — Encounter (HOSPITAL_BASED_OUTPATIENT_CLINIC_OR_DEPARTMENT_OTHER): Payer: Medicare Other | Admitting: Oncology

## 2011-03-25 DIAGNOSIS — Z170 Estrogen receptor positive status [ER+]: Secondary | ICD-10-CM

## 2011-03-25 DIAGNOSIS — R0602 Shortness of breath: Secondary | ICD-10-CM

## 2011-03-25 DIAGNOSIS — Z5111 Encounter for antineoplastic chemotherapy: Secondary | ICD-10-CM

## 2011-03-25 DIAGNOSIS — Z853 Personal history of malignant neoplasm of breast: Secondary | ICD-10-CM

## 2011-03-25 DIAGNOSIS — C50419 Malignant neoplasm of upper-outer quadrant of unspecified female breast: Secondary | ICD-10-CM

## 2011-03-25 LAB — COMPREHENSIVE METABOLIC PANEL
Albumin: 3.6 g/dL (ref 3.5–5.2)
Alkaline Phosphatase: 61 U/L (ref 39–117)
BUN: 12 mg/dL (ref 6–23)
Creatinine, Ser: 0.97 mg/dL (ref 0.50–1.10)
Glucose, Bld: 108 mg/dL — ABNORMAL HIGH (ref 70–99)
Potassium: 4.1 mEq/L (ref 3.5–5.3)

## 2011-03-25 LAB — CBC WITH DIFFERENTIAL/PLATELET
Basophils Absolute: 0 10*3/uL (ref 0.0–0.1)
EOS%: 3.8 % (ref 0.0–7.0)
HCT: 28.9 % — ABNORMAL LOW (ref 34.8–46.6)
HGB: 9.8 g/dL — ABNORMAL LOW (ref 11.6–15.9)
LYMPH%: 29.1 % (ref 14.0–49.7)
MCH: 30.4 pg (ref 25.1–34.0)
MCHC: 33.8 g/dL (ref 31.5–36.0)
MCV: 90 fL (ref 79.5–101.0)
MONO%: 14.6 % — ABNORMAL HIGH (ref 0.0–14.0)
NEUT%: 52.4 % (ref 38.4–76.8)
Platelets: 204 10*3/uL (ref 145–400)

## 2011-03-25 LAB — VITAMIN D 25 HYDROXY (VIT D DEFICIENCY, FRACTURES): Vit D, 25-Hydroxy: 31 ng/mL (ref 30–89)

## 2011-03-30 ENCOUNTER — Ambulatory Visit
Admission: RE | Admit: 2011-03-30 | Discharge: 2011-03-30 | Disposition: A | Discharge status: Home / Self Care | Payer: Medicare Other | Source: Ambulatory Visit | Attending: Oncology | Admitting: Oncology

## 2011-03-30 DIAGNOSIS — Z853 Personal history of malignant neoplasm of breast: Secondary | ICD-10-CM

## 2011-04-04 ENCOUNTER — Ambulatory Visit (HOSPITAL_COMMUNITY)
Admission: RE | Admit: 2011-04-04 | Discharge: 2011-04-04 | Disposition: A | Discharge status: Home / Self Care | Payer: Medicare Other | Source: Ambulatory Visit | Attending: Internal Medicine | Admitting: Internal Medicine

## 2011-04-04 VITALS — BP 168/75 | HR 70 | Ht 65.0 in | Wt 242.0 lb

## 2011-04-04 DIAGNOSIS — C50919 Malignant neoplasm of unspecified site of unspecified female breast: Secondary | ICD-10-CM | POA: Insufficient documentation

## 2011-04-04 DIAGNOSIS — I10 Essential (primary) hypertension: Secondary | ICD-10-CM | POA: Insufficient documentation

## 2011-04-04 MED ORDER — CARVEDILOL 12.5 MG PO TABS: 12.5000 mg | ORAL_TABLET | Freq: Two times a day (BID) | ORAL | Status: DC

## 2011-04-04 MED ORDER — LISINOPRIL 10 MG PO TABS: 10.0000 mg | ORAL_TABLET | Freq: Every day | ORAL | Status: DC

## 2011-04-04 NOTE — Assessment & Plan Note (Signed)
We had a long talk about the role of the HF clinic with regard to her chemotherapy and the prevention of Herceptin cardiotoxicity. After reviewing her echos extensively, I currently do not see any signs of Herceptin cardiotoxicity. Her EF does run on the low end of normal range but this has been unchanged for nearly a decade. Given her HTN, I do feel she is at slightly increased risk for cardiotoxicity but I think this can be managed well with better BP control. Given the importance of Herceptin for her therapy, we have cleared her to continue with therapy starting immediately. For her HTN, we will change metoprolol to carvedilol 12/5 bid and increase lisinopril. We will see her back on a regular basis to help with her BP and also to follow her echos every 3 months. Total time spent 70 minutes.   Marland Kitchen

## 2011-04-04 NOTE — Progress Notes (Signed)
  HPI:  Jeanette Morris is a 65 y/o woman with h/o HTN and obesity. Denies any known h/o CAD or HF. Diagnosed with R Breast CA 9/12. Underwent neoadjuvant chemo with (TCH regimen) followed by carboplatin and Taxotere x 6 cycles. R mastectomy in April 20/21 LN+. ER/PR/NER 2-neu +. Post-op had XRT. [name] would like to start her on Herceptin. However had echocardiogram on 01/06/11 which was read as EF 45-50% and so referred for cardiology clearance.  BP usually difficult to control with systolics 150-160. Denies DOE. Can walk around whole store without limitation. No orthopnea, PND or LE edema. Compliant with all her BP meds. Has been on amlodipine for long time. Metoprolol and lisinopril added last year - they have not been titrated recently.   I reviewed all her echos dating back to 2003. EF has remained in 45-50% range without significant change. Lateral S' velocity also unchanged around 10.5 cm/sec - although some echos with poor windows.    ROS: All other systems normal except as mentioned in HPI, past medical history and problem list.    Past Medical History  Diagnosis Date  . Cancer   . Breast cancer     (Rt) breast ca dx 9/12  . Hypertension     Current Outpatient Prescriptions  Medication Sig Dispense Refill  . amLODipine (NORVASC) 5 MG tablet Take 5 mg by mouth daily.        Marland Kitchen aspirin 81 MG tablet Take 81 mg by mouth daily.        . Calcium Carbonate-Vitamin D 600-400 MG-UNIT per tablet Take 1 tablet by mouth daily.        Marland Kitchen gabapentin (NEURONTIN) 300 MG capsule Take 300 mg by mouth at bedtime.        Marland Kitchen letrozole (FEMARA) 2.5 MG tablet Take 2.5 mg by mouth daily.        Marland Kitchen lisinopril (PRINIVIL,ZESTRIL) 5 MG tablet Take 5 mg by mouth daily.        . metoprolol (TOPROL-XL) 50 MG 24 hr tablet Take 50 mg by mouth daily.        Marland Kitchen oxyCODONE-acetaminophen (PERCOCET) 5-325 MG per tablet Take 1 tablet by mouth as needed.           No Known Allergies  History   Social History  . Marital  Status: Legally Separated    Spouse Name: N/A    Number of Children: N/A  . Years of Education: N/A   Occupational History  . Not on file.   Social History Main Topics  . Smoking status: Former Games developer  . Smokeless tobacco: Not on file  . Alcohol Use: No  . Drug Use: No  . Sexually Active:    Other Topics Concern  . Not on file   Social History Narrative  . No narrative on file    No FHX of CHF or significant heart disease.  PHYSICAL EXAM: Filed Vitals:   04/04/11 1156  BP: 168/75  Pulse: 70   General:  Obese. Well appearing. No respiratory difficulty HEENT: normal Neck: supple. no JVD. Carotids 2+ bilat; no bruits. No lymphadenopathy or thryomegaly appreciated. S/p R mastectomy Cor: PMI nondisplaced. Regular rate & rhythm. No rubs, gallops or murmurs. Lungs: clear Abdomen: obese soft, nontender, nondistended. No hepatosplenomegaly. No bruits or masses. Good bowel sounds. Extremities: no cyanosis, clubbing, rash, edema Neuro: alert & oriented x 3, cranial nerves grossly intact. moves all 4 extremities w/o difficulty. Affect pleasant.    ASSESSMENT & PLAN:

## 2011-04-04 NOTE — Assessment & Plan Note (Signed)
BP elevated. Adjusting regimen as above.

## 2011-04-04 NOTE — Patient Instructions (Signed)
Stop Metoprolol Start Carvedilol 12.5 mg Twice daily  Increase Lisinopril to 10 mg daily  Your physician has requested that you have an echocardiogram. Echocardiography is a painless test that uses sound waves to create images of your heart. It provides your doctor with information about the size and shape of your heart and how well your heart's chambers and valves are working. This procedure takes approximately one hour. There are no restrictions for this procedure.  Needs in Sept.  Your physician recommends that you schedule a follow-up appointment in: 3-4 weeks

## 2011-04-11 ENCOUNTER — Ambulatory Visit
Admission: RE | Admit: 2011-04-11 | Discharge: 2011-04-11 | Disposition: A | Discharge status: Home / Self Care | Payer: Medicare Other | Source: Ambulatory Visit | Attending: Radiation Oncology | Admitting: Radiation Oncology

## 2011-04-11 DIAGNOSIS — C50919 Malignant neoplasm of unspecified site of unspecified female breast: Secondary | ICD-10-CM | POA: Insufficient documentation

## 2011-05-03 ENCOUNTER — Ambulatory Visit (HOSPITAL_COMMUNITY)
Admission: RE | Admit: 2011-05-03 | Discharge: 2011-05-03 | Disposition: A | Discharge status: Home / Self Care | Payer: Medicare Other | Source: Ambulatory Visit | Attending: Internal Medicine | Admitting: Internal Medicine

## 2011-05-03 VITALS — BP 146/84 | HR 65

## 2011-05-03 DIAGNOSIS — C50919 Malignant neoplasm of unspecified site of unspecified female breast: Secondary | ICD-10-CM

## 2011-05-03 DIAGNOSIS — I10 Essential (primary) hypertension: Secondary | ICD-10-CM

## 2011-05-03 DIAGNOSIS — Z01810 Encounter for preprocedural cardiovascular examination: Secondary | ICD-10-CM | POA: Insufficient documentation

## 2011-05-03 DIAGNOSIS — R51 Headache: Secondary | ICD-10-CM | POA: Insufficient documentation

## 2011-05-03 DIAGNOSIS — R42 Dizziness and giddiness: Secondary | ICD-10-CM | POA: Insufficient documentation

## 2011-05-03 DIAGNOSIS — I509 Heart failure, unspecified: Secondary | ICD-10-CM

## 2011-05-03 MED ORDER — LISINOPRIL 10 MG PO TABS: 10.0000 mg | ORAL_TABLET | Freq: Two times a day (BID) | ORAL | Status: DC

## 2011-05-03 NOTE — Assessment & Plan Note (Signed)
Ok with discontinuation of amlodipine.  Would like SBP <140 therefore, will increase lisinopril to 10 mg BID.  Continue coreg and will titrate continue to titrate at next visit.

## 2011-05-03 NOTE — Patient Instructions (Signed)
Increase Lisinopril to 10 mg Twice daily   Your physician recommends that you schedule a follow-up appointment in: 4-6 weeks

## 2011-05-03 NOTE — Progress Notes (Signed)
HPI:  Jeanette Morris is a 65 y/o woman with h/o HTN and obesity. Denies any known h/o CAD or HF. Diagnosed with R Breast CA 9/12. Underwent neoadjuvant chemo with (TCH regimen) followed by carboplatin and Taxotere x 6 cycles. R mastectomy in April 20/21 LN+. ER/PR/NER 2-neu +. Post-op had XRT. [name] would like to start her on Herceptin. However had echocardiogram on 01/06/11 which was read as EF 45-50% and so referred for cardiology clearance.  I reviewed all her echos dating back to 2003. EF has remained in 45-50% range without significant change. Lateral S' velocity also unchanged around 10.5 cm/sec - although some echos with poor windows.  With her ER running on the low end of the normal range for almost a decade and the importance of the Herceptin therapy [name] cleared her for Herceptin therapy, her first appt is scheduled for 9/7.  With pt's HTN it was noted that there is a slightly increased risk for cardiotoxicity therefore we increased her lisinopril to 10 mg daily and changed her metoprolol to coreg 12.5 mg BID.    She returns for follow up today.  She has been complaining of dizziness/HA and stopped her amlodipine 2 weeks ago which helped.  She denied any syncope.  She has been doing well at home, denies DOE, orthopnea, PND or LE edema.  She has been compliant with all other medications, recently started per her PCP.    We checked orthostatics today and sitting SBP 146 with standing 140.  A 2D echo was done today and [name] has reviewed the images noting that the endocardium not well visualized but EF appears 50-55%.  The Lateral S' velocity around 11.5 cm/sec.  (Full report pending)    ROS: All other systems normal except as mentioned in HPI, past medical history and problem list.    Past Medical History  Diagnosis Date  . Cancer   . Breast cancer     (Rt) breast ca dx 9/12  . Hypertension     Current Outpatient Prescriptions  Medication Sig Dispense Refill  .  amLODipine (NORVASC) 5 MG tablet Take 5 mg by mouth daily.        Marland Kitchen aspirin 81 MG tablet Take 81 mg by mouth daily.        . Calcium Carbonate-Vitamin D 600-400 MG-UNIT per tablet Take 1 tablet by mouth daily.        . carvedilol (COREG) 12.5 MG tablet Take 1 tablet (12.5 mg total) by mouth 2 (two) times daily with a meal.  60 tablet  6  . ferrous sulfate 325 (65 FE) MG tablet Take 325 mg by mouth daily with breakfast.        . gabapentin (NEURONTIN) 300 MG capsule Take 300 mg by mouth at bedtime.        Marland Kitchen letrozole (FEMARA) 2.5 MG tablet Take 2.5 mg by mouth daily.        Marland Kitchen lisinopril (PRINIVIL,ZESTRIL) 10 MG tablet Take 1 tablet (10 mg total) by mouth daily.  30 tablet  6  . oxyCODONE-acetaminophen (PERCOCET) 5-325 MG per tablet Take 1 tablet by mouth as needed.        . simvastatin (ZOCOR) 10 MG tablet Take 10 mg by mouth at bedtime.           No Known Allergies  History   Social History  . Marital Status: Legally Separated    Spouse Name: N/A    Number of Children: N/A  . Years of  Education: N/A   Occupational History  . Not on file.   Social History Main Topics  . Smoking status: Former Games developer  . Smokeless tobacco: Not on file  . Alcohol Use: No  . Drug Use: No  . Sexually Active:    Other Topics Concern  . Not on file   Social History Narrative  . No narrative on file    No FHX of CHF or significant heart disease.  PHYSICAL EXAM: Filed Vitals:   05/03/11 1219  Pulse: 65   General:  Obese. Well appearing. No respiratory difficulty HEENT: normal Neck: supple. no JVD. Carotids 2+ bilat; no bruits. No lymphadenopathy or thryomegaly appreciated. S/p R mastectomy Cor: PMI nondisplaced. Regular rate & rhythm. No rubs, gallops or murmurs. Lungs: clear Abdomen: obese soft, nontender, nondistended. No hepatosplenomegaly. No bruits or masses. Good bowel sounds. Extremities: no cyanosis, clubbing, rash, edema Neuro: alert & oriented x 3, cranial nerves grossly intact.  moves all 4 extremities w/o difficulty. Affect pleasant.    ASSESSMENT & PLAN:

## 2011-05-03 NOTE — Assessment & Plan Note (Addendum)
Reviewed echo from today, EF 50-55%.  She has been cleared to start herceptin this week with close monitoring of her BP has she is still at increased risk of cardiotoxicity.  Will increase lisinopril 10 mg to BID with discontinuation of her amlodipine.  Can titrate further at next visit.  Will need follow up echo every 3 months while on herceptin.   Patient seen and examined with Ulyess Blossom PA-C. We discussed all aspects of the encounter. I agree with the assessment and plan as stated above.

## 2011-05-06 ENCOUNTER — Encounter: Payer: Medicare Other | Admitting: Oncology

## 2011-05-06 ENCOUNTER — Other Ambulatory Visit: Payer: Self-pay | Admitting: Oncology

## 2011-05-06 DIAGNOSIS — C773 Secondary and unspecified malignant neoplasm of axilla and upper limb lymph nodes: Secondary | ICD-10-CM

## 2011-05-06 DIAGNOSIS — E559 Vitamin D deficiency, unspecified: Secondary | ICD-10-CM

## 2011-05-06 DIAGNOSIS — Z170 Estrogen receptor positive status [ER+]: Secondary | ICD-10-CM

## 2011-05-06 DIAGNOSIS — Z853 Personal history of malignant neoplasm of breast: Secondary | ICD-10-CM

## 2011-05-06 LAB — COMPREHENSIVE METABOLIC PANEL
ALT: 12 U/L (ref 0–35)
AST: 17 U/L (ref 0–37)
Alkaline Phosphatase: 57 U/L (ref 39–117)
Glucose, Bld: 99 mg/dL (ref 70–99)
Potassium: 4.2 mEq/L (ref 3.5–5.3)
Sodium: 137 mEq/L (ref 135–145)
Total Bilirubin: 0.4 mg/dL (ref 0.3–1.2)
Total Protein: 7.5 g/dL (ref 6.0–8.3)

## 2011-05-06 LAB — CBC WITH DIFFERENTIAL/PLATELET
BASO%: 0.5 % (ref 0.0–2.0)
EOS%: 2.5 % (ref 0.0–7.0)
LYMPH%: 28.5 % (ref 14.0–49.7)
MCH: 31.1 pg (ref 25.1–34.0)
MCHC: 33.7 g/dL (ref 31.5–36.0)
MCV: 92.3 fL (ref 79.5–101.0)
MONO%: 13 % (ref 0.0–14.0)
Platelets: 164 10*3/uL (ref 145–400)
RBC: 3.19 10*6/uL — ABNORMAL LOW (ref 3.70–5.45)
RDW: 15.3 % — ABNORMAL HIGH (ref 11.2–14.5)

## 2011-05-06 LAB — VITAMIN D 25 HYDROXY (VIT D DEFICIENCY, FRACTURES): Vit D, 25-Hydroxy: 32 ng/mL (ref 30–89)

## 2011-05-13 ENCOUNTER — Encounter (HOSPITAL_BASED_OUTPATIENT_CLINIC_OR_DEPARTMENT_OTHER): Payer: Medicare Other | Admitting: Oncology

## 2011-05-13 ENCOUNTER — Other Ambulatory Visit: Payer: Self-pay | Admitting: Oncology

## 2011-05-13 DIAGNOSIS — C50919 Malignant neoplasm of unspecified site of unspecified female breast: Secondary | ICD-10-CM

## 2011-05-13 DIAGNOSIS — Z170 Estrogen receptor positive status [ER+]: Secondary | ICD-10-CM

## 2011-05-13 DIAGNOSIS — Z853 Personal history of malignant neoplasm of breast: Secondary | ICD-10-CM

## 2011-05-20 LAB — CBC
HCT: 36.2
MCHC: 33.3
MCV: 91.2
Platelets: 284
WBC: 7.7

## 2011-05-20 LAB — BASIC METABOLIC PANEL
BUN: 8
CO2: 32
Chloride: 100
Potassium: 4

## 2011-05-20 LAB — DIFFERENTIAL
Eosinophils Absolute: 0.1
Eosinophils Relative: 1
Lymphs Abs: 2.2
Monocytes Relative: 6

## 2011-06-02 ENCOUNTER — Ambulatory Visit
Admission: RE | Admit: 2011-06-02 | Discharge: 2011-06-02 | Disposition: A | Discharge status: Home / Self Care | Payer: Medicare Other | Source: Ambulatory Visit | Attending: Oncology | Admitting: Oncology

## 2011-06-02 ENCOUNTER — Other Ambulatory Visit: Payer: Self-pay | Admitting: Oncology

## 2011-06-02 DIAGNOSIS — Z853 Personal history of malignant neoplasm of breast: Secondary | ICD-10-CM

## 2011-06-13 ENCOUNTER — Ambulatory Visit (HOSPITAL_COMMUNITY)
Admission: RE | Admit: 2011-06-13 | Discharge: 2011-06-13 | Disposition: A | Discharge status: Home / Self Care | Payer: Medicare Other | Source: Ambulatory Visit | Attending: Internal Medicine | Admitting: Internal Medicine

## 2011-06-13 VITALS — BP 130/78 | HR 80 | Wt 240.2 lb

## 2011-06-13 DIAGNOSIS — C50919 Malignant neoplasm of unspecified site of unspecified female breast: Secondary | ICD-10-CM | POA: Insufficient documentation

## 2011-06-13 DIAGNOSIS — I10 Essential (primary) hypertension: Secondary | ICD-10-CM | POA: Insufficient documentation

## 2011-06-13 NOTE — Patient Instructions (Signed)
Increase coreg to 1.5 tab twice daily (18.75 mg).  Watch for dizziness or headaches.  Call with any problems.  Follow up with [name] in January 2013 with a repeat echo at that time.    Call when herceptin is restarted.  Continue to walk.

## 2011-06-13 NOTE — Assessment & Plan Note (Signed)
Will increase coreg 18.75 mg BID.  Continue to keep SBP<140.

## 2011-06-13 NOTE — Progress Notes (Signed)
HPI:  Jeanette Morris is a 65 y/o woman with h/o HTN and obesity. Denies any known h/o CAD or HF. Diagnosed with R Breast CA 9/12. Underwent neoadjuvant chemo with (TCH regimen) followed by carboplatin and Taxotere x 6 cycles. R mastectomy in April 20/21 LN+. ER/PR/NER 2-neu +. Post-op had XRT. [name] would like to start her on Herceptin. However had echocardiogram on 01/06/11 which was read as EF 45-50% and so referred for cardiology clearance.  I reviewed all her echos dating back to 2003. EF has remained in 45-50% range without significant change. Lateral S' velocity also unchanged around 10.5 cm/sec - although some echos with poor windows.  With her ER running on the low end of the normal range for almost a decade and the importance of the Herceptin therapy [name] cleared her for Herceptin therapy, her first appt is scheduled for 9/7.  With pt's HTN it was noted that there is a slightly increased risk for cardiotoxicity therefore we increased her lisinopril to 10 mg BID and changed her metoprolol to coreg 12.5 mg BID.    Last visit she had c/o dizziness and HA and stopped her amlodipine 2 weeks ago which helped.  Repeat echo at that time showed EF 55%.  [name] calculated lateral s' velocity around 11.5 cm/sec.    She returns for follow up today.  She was cleared last visit to restart herceptin with close monitoring as her EF had improved.  She has not restarted this herceptin as she does not see [name] until Jan 2012 but is having her port flushed monthly.   She is doing well.  She denies syncope, dizziness or HA.  She also denies DOE, orthopnea, PND or LE edema.  She is walking 5 laps around the mall 2x week without difficulty.          ROS: All other systems normal except as mentioned in HPI, past medical history and problem list.    Past Medical History  Diagnosis Date  . Cancer   . Breast cancer     (Rt) breast ca dx 9/12  . Hypertension     Current Outpatient  Prescriptions  Medication Sig Dispense Refill  . aspirin 81 MG tablet Take 81 mg by mouth daily.        . Calcium Carbonate-Vitamin D 600-400 MG-UNIT per tablet Take 1 tablet by mouth daily.        . carvedilol (COREG) 12.5 MG tablet Take 1 tablet (12.5 mg total) by mouth 2 (two) times daily with a meal.  60 tablet  6  . ferrous sulfate 325 (65 FE) MG tablet Take 325 mg by mouth daily with breakfast.        . gabapentin (NEURONTIN) 300 MG capsule Take 300 mg by mouth at bedtime.        Marland Kitchen letrozole (FEMARA) 2.5 MG tablet Take 2.5 mg by mouth daily.        Marland Kitchen lisinopril (PRINIVIL,ZESTRIL) 10 MG tablet Take 1 tablet (10 mg total) by mouth 2 (two) times daily.  60 tablet  6  . simvastatin (ZOCOR) 10 MG tablet Take 10 mg by mouth at bedtime.           No Known Allergies  History   Social History  . Marital Status: Legally Separated    Spouse Name: N/A    Number of Children: N/A  . Years of Education: N/A   Occupational History  . Not on file.   Social History Main  Topics  . Smoking status: Former Games developer  . Smokeless tobacco: Not on file  . Alcohol Use: No  . Drug Use: No  . Sexually Active:    Other Topics Concern  . Not on file   Social History Narrative  . No narrative on file    No FHX of CHF or significant heart disease.  PHYSICAL EXAM: Filed Vitals:   06/13/11 1047  BP: 130/78  Pulse: 80  Wt 240  General:  Obese. Well appearing. No respiratory difficulty HEENT: normal Neck: supple. no JVD. Carotids 2+ bilat; no bruits. No lymphadenopathy or thryomegaly appreciated. S/p R mastectomy Cor: PMI nondisplaced. Regular rate & rhythm. No rubs, gallops or murmurs. Lungs: clear Abdomen: obese soft, nontender, nondistended. No hepatosplenomegaly. No bruits or masses. Good bowel sounds. Extremities: no cyanosis, clubbing, rash, edema Neuro: alert & oriented x 3, cranial nerves grossly intact. moves all 4 extremities w/o difficulty. Affect pleasant.    ASSESSMENT &  PLAN:

## 2011-06-13 NOTE — Assessment & Plan Note (Addendum)
She has been cleared to restart herceptin, EF 55%.  Will recheck echo every 3 months on herceptin.  Will discuss restart of herceptin with [name].  Continue to follow BP closely.  Will increase coreg today 18.75 BID, watch for dizziness.    Patient seen and examined with Ulyess Blossom PA-C. We discussed all aspects of the encounter. I agree with the assessment and plan as stated above.

## 2011-06-14 ENCOUNTER — Encounter (HOSPITAL_BASED_OUTPATIENT_CLINIC_OR_DEPARTMENT_OTHER): Payer: Medicaid Other | Admitting: Oncology

## 2011-06-14 DIAGNOSIS — C50919 Malignant neoplasm of unspecified site of unspecified female breast: Secondary | ICD-10-CM

## 2011-06-14 DIAGNOSIS — Z452 Encounter for adjustment and management of vascular access device: Secondary | ICD-10-CM

## 2011-06-26 NOTE — Progress Notes (Signed)
Patient seen and examined with Nicki Bradley PA-C. We discussed all aspects of the encounter. I agree with the assessment and plan as stated above.   

## 2011-07-15 ENCOUNTER — Ambulatory Visit (HOSPITAL_BASED_OUTPATIENT_CLINIC_OR_DEPARTMENT_OTHER): Payer: Medicaid Other

## 2011-07-15 DIAGNOSIS — C50919 Malignant neoplasm of unspecified site of unspecified female breast: Secondary | ICD-10-CM

## 2011-07-15 DIAGNOSIS — Z452 Encounter for adjustment and management of vascular access device: Secondary | ICD-10-CM

## 2011-07-15 MED ORDER — HEPARIN SOD (PORK) LOCK FLUSH 100 UNIT/ML IV SOLN
500.0000 [IU] | Freq: Once | INTRAVENOUS | Status: AC
  Administered 2011-07-15: 500 [IU] via INTRAVENOUS
  Filled 2011-07-15: qty 5

## 2011-07-15 MED ORDER — SODIUM CHLORIDE 0.9 % IJ SOLN
10.0000 mL | INTRAMUSCULAR | Status: DC | PRN
  Administered 2011-07-15: 10 mL via INTRAVENOUS
  Filled 2011-07-15: qty 10

## 2011-07-26 ENCOUNTER — Ambulatory Visit (INDEPENDENT_AMBULATORY_CARE_PROVIDER_SITE_OTHER): Payer: Medicare Other | Admitting: General Surgery

## 2011-07-26 ENCOUNTER — Encounter (INDEPENDENT_AMBULATORY_CARE_PROVIDER_SITE_OTHER): Payer: Self-pay | Admitting: General Surgery

## 2011-07-26 DIAGNOSIS — L989 Disorder of the skin and subcutaneous tissue, unspecified: Secondary | ICD-10-CM

## 2011-07-26 DIAGNOSIS — C50919 Malignant neoplasm of unspecified site of unspecified female breast: Secondary | ICD-10-CM

## 2011-07-26 NOTE — Patient Instructions (Signed)
Continue regular self exams  

## 2011-07-26 NOTE — Progress Notes (Signed)
Subjective:     Patient ID: Jeanette Morris, female   DOB: [birth year], 65 y.o.   MRN: [id number]  HPI The patient is a 65 year old black female who is now almost 8 months out from a right mastectomy and axillary node dissection for a T. XN 3 right breast cancer. She received neoadjuvant chemotherapy. She finished her radiation therapy. She is now taking Femara and tolerating it well. She notes a little bit of soreness of the right chest wall with activity but otherwise feels well and has no complaints.  Review of Systems  Constitutional: Negative.   HENT: Negative.   Eyes: Negative.   Respiratory: Negative.   Cardiovascular: Negative.   Gastrointestinal: Negative.   Genitourinary: Negative.   Musculoskeletal: Negative.   Skin: Negative.   Neurological: Negative.   Hematological: Negative.   Psychiatric/Behavioral: Negative.        Objective:   Physical Exam  Constitutional: She is oriented to person, place, and time. She appears well-developed and well-nourished.  HENT:  Head: Normocephalic and atraumatic.  Eyes: Conjunctivae and EOM are normal. Pupils are equal, round, and reactive to light.  Neck: Normal range of motion. Neck supple.  Cardiovascular: Normal rate, regular rhythm and normal heart sounds.   Pulmonary/Chest: Effort normal and breath sounds normal.       She has no palpable mass of the right chest wall. No palpable mass of the left breast. No axillary supraclavicular or cervical lymphadenopathy  Abdominal: Soft. Bowel sounds are normal. She exhibits no mass. There is no tenderness.  Musculoskeletal: Normal range of motion.       No swelling of the right arm  Lymphadenopathy:    She has no cervical adenopathy.  Neurological: She is alert and oriented to person, place, and time.  Skin: Skin is warm and dry.  Psychiatric: She has a normal mood and affect. Her behavior is normal.       Assessment:     8 months status post right mastectomy and axillary node  dissection    Plan:     At this point she will continue her anastrazole. She will also continue to do regular self exams. We will plan to see her back in about 3 months.

## 2011-08-29 ENCOUNTER — Other Ambulatory Visit: Payer: Self-pay | Admitting: *Deleted

## 2011-08-29 DIAGNOSIS — C50919 Malignant neoplasm of unspecified site of unspecified female breast: Secondary | ICD-10-CM

## 2011-08-29 MED ORDER — LETROZOLE 2.5 MG PO TABS: 2.5000 mg | ORAL_TABLET | Freq: Every day | ORAL | Status: DC

## 2011-09-01 ENCOUNTER — Other Ambulatory Visit (HOSPITAL_COMMUNITY): Payer: Self-pay | Admitting: Adult Health

## 2011-09-01 DIAGNOSIS — C50919 Malignant neoplasm of unspecified site of unspecified female breast: Secondary | ICD-10-CM

## 2011-09-02 ENCOUNTER — Ambulatory Visit (INDEPENDENT_AMBULATORY_CARE_PROVIDER_SITE_OTHER): Payer: Medicare Other | Admitting: General Surgery

## 2011-09-02 ENCOUNTER — Other Ambulatory Visit (INDEPENDENT_AMBULATORY_CARE_PROVIDER_SITE_OTHER): Payer: Self-pay | Admitting: General Surgery

## 2011-09-02 VITALS — BP 120/82 | HR 72 | Temp 98.4°F | Resp 12 | Ht 65.0 in | Wt 245.0 lb

## 2011-09-02 DIAGNOSIS — C44721 Squamous cell carcinoma of skin of unspecified lower limb, including hip: Secondary | ICD-10-CM

## 2011-09-02 DIAGNOSIS — L989 Disorder of the skin and subcutaneous tissue, unspecified: Secondary | ICD-10-CM

## 2011-09-02 NOTE — Patient Instructions (Signed)
May shower tomorrow.

## 2011-09-06 ENCOUNTER — Encounter (INDEPENDENT_AMBULATORY_CARE_PROVIDER_SITE_OTHER): Payer: Self-pay | Admitting: General Surgery

## 2011-09-06 NOTE — Progress Notes (Signed)
Subjective:     Patient ID: Jeanette Morris, female   DOB: November 25, 1945, 66 y.o.   MRN: [id number]  HPI The patient is a 66 year old white female who is a long-term breast cancer patient of ours. She has had a lesion on her left thigh for a number of years that bothers her. It sticks out and she occasionally traumatizes the area or scratches it makes it bleed. She presents today because she would like it removed.  Review of Systems     Objective:   Physical Exam On exam she has about a 1 cm area on the anterior left thigh that almost looks like a cutaneous horn. This area was prepped with Betadine and draped in usual sterile manner. The area was infiltrated with 1% lidocaine until a good field block was created. The mass was excised sharply full-thickness with an 11 blade knife down to the subcutaneous fat. The incision was then closed with interrupted 4-0 Monocryl subcuticular stitches. Steri-Strips and a sterile dressing were applied. The patient tolerated the procedure well.    Assessment:     Mass successfully removed from the left anterior thigh    Plan:     We will plan to see her back in about 2 weeks for a wound check.

## 2011-09-15 ENCOUNTER — Encounter (HOSPITAL_COMMUNITY): Payer: Medicaid Other

## 2011-09-19 ENCOUNTER — Ambulatory Visit (INDEPENDENT_AMBULATORY_CARE_PROVIDER_SITE_OTHER): Payer: Medicare Other | Admitting: General Surgery

## 2011-09-19 ENCOUNTER — Encounter (INDEPENDENT_AMBULATORY_CARE_PROVIDER_SITE_OTHER): Payer: Medicare Other | Admitting: General Surgery

## 2011-09-19 ENCOUNTER — Encounter (INDEPENDENT_AMBULATORY_CARE_PROVIDER_SITE_OTHER): Payer: Self-pay | Admitting: General Surgery

## 2011-09-19 VITALS — BP 170/90 | HR 72 | Temp 98.2°F | Resp 14 | Ht 65.0 in | Wt 245.0 lb

## 2011-09-19 DIAGNOSIS — L989 Disorder of the skin and subcutaneous tissue, unspecified: Secondary | ICD-10-CM

## 2011-09-19 NOTE — Progress Notes (Signed)
Subjective:     Patient ID: Jeanette Morris, female   DOB: December 15, 1945, 66 y.o.   MRN: [id number]  HPI The patient is a 66 year old white female who recently had a new lesion removed from her left anterior thigh. 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 It appeared to be completely removed. She has no complaints today.  Review of Systems     Objective:   Physical Exam On exam the incision on the left seems to be healing well. There is a little bit of scab present. No sign of infection.    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 We will plan to see her back in about 3 months for her regular breast cancer check. She will continue to do regular self exams. I have written her a new prescription for Percocet today to treat occasional soreness she gets of her right chest wall with activity

## 2011-09-22 ENCOUNTER — Encounter (HOSPITAL_COMMUNITY): Payer: Self-pay

## 2011-09-22 ENCOUNTER — Ambulatory Visit (HOSPITAL_COMMUNITY)
Admission: RE | Admit: 2011-09-22 | Discharge: 2011-09-22 | Disposition: A | Discharge status: Home / Self Care | Payer: Medicare Other | Source: Ambulatory Visit | Attending: Internal Medicine | Admitting: Internal Medicine

## 2011-09-22 VITALS — BP 162/76 | HR 81 | Wt 244.5 lb

## 2011-09-22 DIAGNOSIS — Z09 Encounter for follow-up examination after completed treatment for conditions other than malignant neoplasm: Secondary | ICD-10-CM

## 2011-09-22 DIAGNOSIS — C50919 Malignant neoplasm of unspecified site of unspecified female breast: Secondary | ICD-10-CM | POA: Insufficient documentation

## 2011-09-22 DIAGNOSIS — Z87891 Personal history of nicotine dependence: Secondary | ICD-10-CM | POA: Insufficient documentation

## 2011-09-22 DIAGNOSIS — I10 Essential (primary) hypertension: Secondary | ICD-10-CM | POA: Insufficient documentation

## 2011-09-22 MED ORDER — LISINOPRIL 10 MG PO TABS: 10.0000 mg | ORAL_TABLET | Freq: Two times a day (BID) | ORAL | Status: DC

## 2011-09-22 NOTE — Patient Instructions (Signed)
Increase carvedilol 12.5 mg twice daily.  Take lisinopril 10 mg twice daily.  Follow up 3 months with echo.

## 2011-09-26 ENCOUNTER — Telehealth: Payer: Self-pay | Admitting: *Deleted

## 2011-09-26 ENCOUNTER — Other Ambulatory Visit: Payer: Self-pay | Admitting: *Deleted

## 2011-09-26 NOTE — Telephone Encounter (Signed)
gave patient appointment mammogram on 01-04-2012 at 8:30am patient has appointment for lab and magrinat in august 2013 lab one week before dr. appointment

## 2011-09-27 ENCOUNTER — Ambulatory Visit (HOSPITAL_BASED_OUTPATIENT_CLINIC_OR_DEPARTMENT_OTHER): Payer: Medicare Other | Admitting: Oncology

## 2011-09-27 ENCOUNTER — Telehealth: Payer: Self-pay | Admitting: Oncology

## 2011-09-27 VITALS — BP 160/83 | HR 75 | Temp 98.5°F | Ht 65.0 in | Wt 243.3 lb

## 2011-09-27 DIAGNOSIS — I890 Lymphedema, not elsewhere classified: Secondary | ICD-10-CM

## 2011-09-27 DIAGNOSIS — C50919 Malignant neoplasm of unspecified site of unspecified female breast: Secondary | ICD-10-CM

## 2011-09-27 DIAGNOSIS — M79609 Pain in unspecified limb: Secondary | ICD-10-CM

## 2011-09-27 DIAGNOSIS — Z170 Estrogen receptor positive status [ER+]: Secondary | ICD-10-CM

## 2011-09-27 MED ORDER — LIDOCAINE-PRILOCAINE 2.5-2.5 % EX CREA: TOPICAL_CREAM | Freq: Once | CUTANEOUS | Status: DC

## 2011-09-27 NOTE — Telephone Encounter (Signed)
gve the pt her feb 2013 appt calendar °

## 2011-10-01 NOTE — Assessment & Plan Note (Signed)
BP elevated. Titrating lisinopri and carvedilol.

## 2011-10-01 NOTE — Assessment & Plan Note (Signed)
Echos reviewed at length with her. Windows suboptimal but parameters appear stable will continue Herceptin. Given HTN with continue to titrate lisinopril (10 bid) and carvedilol (12.5 bid). F/u in 3 months with echo.

## 2011-10-01 NOTE — Progress Notes (Signed)
HPI:  Jeanette Morris is a 66 y/o woman with h/o HTN and obesity. Denies any known h/o CAD or HF.  She was diagnosed with R Breast CA in 9/11. Underwent neoadjuvant chemo with (TCH regimen) followed by carboplatin and Taxotere x 6 cycles. She is status post rt mastectomy in April 2012 LN+. ER/PR/NER 2-neu +. Post-op had XRT. [name] would like to start her on Herceptin. However had echocardiogram on 01/06/11 which was read as EF 45-50% and so referred for cardiology clearance.  Her echos have been reviewed, dating back to 2003. EF has remained in 45-50% range without significant change. Lateral S' velocity also unchanged around 10.5 cm/sec - although some echos with poor windows.  With her EF running on the low end of the normal range for almost a decade and the importance of the Herceptin therapy, I cleared her for Herceptin therapy.  Her first appt was scheduled for 9/7.  She has had a problem with HTN and there is a slightly increased risk for cardiotoxicity therefore we increased her lisinopril to 10 mg BID and changed her metoprolol to coreg 12.5 mg BID.  She was amlodipine but this was discontinued 04/2011 due to dizziness/HA.    Echos: 05/03/11: 55%, lateral s' velocity around 11.5 cm/sec.   09/22/11: 55%, lateral s' 10.5-11 but poor window  She returns for routine follow up today.  She was cleared to restart herceptin with close monitoring but she has failed to get in touch with [name] since her last visit.  It is also noted that she is only taking her carvedilol and lisinopril once a day which is evident in her BP.  She has not had her port flushed since November.  She feels well.  She denies syncope, dizziness or HA.  She also denies DOE, orthopnea, PND or LE edema.  She is walking 5 laps [place] mall 2x week without difficulty.  Echo results reviewed with her today.    ROS: All other systems normal except as mentioned in HPI, past medical history and problem list.    Past Medical History    Diagnosis Date  . Hypertension   . Cancer   . Breast cancer     (Rt) breast ca dx 9/11  . Hearing loss     right ear  . Hyperlipidemia   . Arthritis     Current Outpatient Prescriptions  Medication Sig Dispense Refill  . aspirin 81 MG tablet Take 81 mg by mouth daily.        . Calcium Carbonate-Vitamin D 600-400 MG-UNIT per tablet Take 1 tablet by mouth daily.        . carvedilol (COREG) 12.5 MG tablet Take 1 tablet (12.5 mg total) by mouth 2 (two) times daily with a meal.  60 tablet  6  . ferrous sulfate 325 (65 FE) MG tablet Take 325 mg by mouth daily with breakfast.        . gabapentin (NEURONTIN) 300 MG capsule Take 300 mg by mouth at bedtime.        Marland Kitchen letrozole (FEMARA) 2.5 MG tablet Take 1 tablet (2.5 mg total) by mouth daily.  30 tablet  4  . lisinopril (PRINIVIL,ZESTRIL) 10 MG tablet Take 1 tablet (10 mg total) by mouth 2 (two) times daily.      Marland Kitchen lisinopril (PRINIVIL,ZESTRIL) 10 MG tablet Take 1 tablet (10 mg total) by mouth 2 (two) times daily.  60 tablet  6  . simvastatin (ZOCOR) 10 MG tablet Take 10  mg by mouth at bedtime.         No current facility-administered medications for this encounter.   Facility-Administered Medications Ordered in Other Encounters  Medication Dose Route Frequency Provider Last Rate Last Dose  . lidocaine-prilocaine (EMLA) cream   Topical Once Pierce Crane, MD         No Known Allergies   PHYSICAL EXAM: Filed Vitals:   09/22/11 1238  BP: 162/76  Pulse: 81  Weight: 244 lb 8 oz (110.904 kg)  SpO2: 96%   General:  Obese. Well appearing. No respiratory difficulty HEENT: normal Neck: supple. no JVD. Carotids 2+ bilat; no bruits. No lymphadenopathy or thryomegaly appreciated. S/p R mastectomy Cor: PMI nondisplaced. Regular rate & rhythm. No rubs, gallops or murmurs. Lungs: clear Abdomen: obese soft, nontender, nondistended. No hepatosplenomegaly. No bruits or masses. Good bowel sounds. Extremities: no cyanosis, clubbing, rash,  edema Neuro: alert & oriented x 3, cranial nerves grossly intact. moves all 4 extremities w/o difficulty. Affect pleasant.    ASSESSMENT & PLAN:

## 2011-10-03 ENCOUNTER — Ambulatory Visit (HOSPITAL_BASED_OUTPATIENT_CLINIC_OR_DEPARTMENT_OTHER): Payer: Medicaid Other

## 2011-10-03 ENCOUNTER — Ambulatory Visit: Payer: Medicare Other | Admitting: Physician Assistant

## 2011-10-03 DIAGNOSIS — Z5112 Encounter for antineoplastic immunotherapy: Secondary | ICD-10-CM

## 2011-10-03 DIAGNOSIS — C50919 Malignant neoplasm of unspecified site of unspecified female breast: Secondary | ICD-10-CM

## 2011-10-03 MED ORDER — SODIUM CHLORIDE 0.9 % IV SOLN: Freq: Once | INTRAVENOUS | Status: DC

## 2011-10-03 MED ORDER — TRASTUZUMAB CHEMO INJECTION 440 MG
6.0000 mg/kg | Freq: Once | INTRAVENOUS | Status: AC
  Administered 2011-10-03: 672 mg via INTRAVENOUS
  Filled 2011-10-03: qty 32

## 2011-10-03 MED ORDER — DIPHENHYDRAMINE HCL 25 MG PO CAPS
50.0000 mg | ORAL_CAPSULE | Freq: Once | ORAL | Status: AC
  Administered 2011-10-03: 50 mg via ORAL

## 2011-10-03 MED ORDER — ACETAMINOPHEN 325 MG PO TABS
650.0000 mg | ORAL_TABLET | Freq: Once | ORAL | Status: AC
  Administered 2011-10-03: 650 mg via ORAL

## 2011-10-03 MED ORDER — HEPARIN SOD (PORK) LOCK FLUSH 100 UNIT/ML IV SOLN
500.0000 [IU] | Freq: Once | INTRAVENOUS | Status: AC | PRN
  Administered 2011-10-03: 500 [IU]
  Filled 2011-10-03: qty 5

## 2011-10-03 MED ORDER — SODIUM CHLORIDE 0.9 % IJ SOLN
10.0000 mL | INTRAMUSCULAR | Status: DC | PRN
  Administered 2011-10-03: 10 mL
  Filled 2011-10-03: qty 10

## 2011-10-24 ENCOUNTER — Encounter: Payer: Self-pay | Admitting: Physician Assistant

## 2011-10-24 ENCOUNTER — Ambulatory Visit (HOSPITAL_BASED_OUTPATIENT_CLINIC_OR_DEPARTMENT_OTHER): Payer: Medicare Other

## 2011-10-24 ENCOUNTER — Other Ambulatory Visit: Payer: Self-pay | Admitting: Oncology

## 2011-10-24 ENCOUNTER — Ambulatory Visit: Payer: Medicare Other | Admitting: Physician Assistant

## 2011-10-24 ENCOUNTER — Other Ambulatory Visit: Payer: Medicare Other | Admitting: Lab

## 2011-10-24 VITALS — BP 152/77 | HR 75 | Temp 98.9°F | Ht 65.0 in | Wt 251.2 lb

## 2011-10-24 DIAGNOSIS — C50919 Malignant neoplasm of unspecified site of unspecified female breast: Secondary | ICD-10-CM

## 2011-10-24 DIAGNOSIS — Z5112 Encounter for antineoplastic immunotherapy: Secondary | ICD-10-CM

## 2011-10-24 LAB — LACTATE DEHYDROGENASE: LDH: 165 U/L (ref 94–250)

## 2011-10-24 LAB — CBC WITH DIFFERENTIAL/PLATELET
BASO%: 0.2 % (ref 0.0–2.0)
EOS%: 1.2 % (ref 0.0–7.0)
LYMPH%: 25.8 % (ref 14.0–49.7)
MCHC: 32.9 g/dL (ref 31.5–36.0)
MCV: 93 fL (ref 79.5–101.0)
MONO%: 9 % (ref 0.0–14.0)
Platelets: 186 10*3/uL (ref 145–400)
RBC: 3.27 10*6/uL — ABNORMAL LOW (ref 3.70–5.45)
WBC: 4.3 10*3/uL (ref 3.9–10.3)
nRBC: 0 % (ref 0–0)

## 2011-10-24 LAB — CANCER ANTIGEN 27.29: CA 27.29: 39 U/mL (ref 0–39)

## 2011-10-24 LAB — COMPREHENSIVE METABOLIC PANEL
ALT: 13 U/L (ref 0–35)
CO2: 29 mEq/L (ref 19–32)
Creatinine, Ser: 0.99 mg/dL (ref 0.50–1.10)
Glucose, Bld: 100 mg/dL — ABNORMAL HIGH (ref 70–99)
Total Bilirubin: 0.4 mg/dL (ref 0.3–1.2)

## 2011-10-24 MED ORDER — DIPHENHYDRAMINE HCL 25 MG PO CAPS
50.0000 mg | ORAL_CAPSULE | Freq: Once | ORAL | Status: AC
  Administered 2011-10-24: 50 mg via ORAL

## 2011-10-24 MED ORDER — TRASTUZUMAB CHEMO INJECTION 440 MG
6.0000 mg/kg | Freq: Once | INTRAVENOUS | Status: AC
  Administered 2011-10-24: 672 mg via INTRAVENOUS
  Filled 2011-10-24: qty 32

## 2011-10-24 MED ORDER — LIDOCAINE-PRILOCAINE 2.5-2.5 % EX CREA: TOPICAL_CREAM | Freq: Once | CUTANEOUS | Status: DC

## 2011-10-24 MED ORDER — SODIUM CHLORIDE 0.9 % IV SOLN
Freq: Once | INTRAVENOUS | Status: AC
  Administered 2011-10-24: 100 mL via INTRAVENOUS

## 2011-10-24 MED ORDER — ACETAMINOPHEN 325 MG PO TABS
650.0000 mg | ORAL_TABLET | Freq: Once | ORAL | Status: AC
  Administered 2011-10-24: 650 mg via ORAL

## 2011-10-24 MED ORDER — SODIUM CHLORIDE 0.9 % IJ SOLN
10.0000 mL | INTRAMUSCULAR | Status: DC | PRN
Start: 2011-10-24 — End: 2011-10-24
  Administered 2011-10-24: 10 mL
  Filled 2011-10-24: qty 10

## 2011-10-24 MED ORDER — HEPARIN SOD (PORK) LOCK FLUSH 100 UNIT/ML IV SOLN
500.0000 [IU] | Freq: Once | INTRAVENOUS | Status: AC | PRN
  Administered 2011-10-24: 500 [IU]
  Filled 2011-10-24: qty 5

## 2011-10-24 NOTE — Progress Notes (Signed)
Hematology and Oncology Follow Up Visit  Jeanette Morris [id number] 1945/09/05 66 y.o. 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 The Herceptin was held due to low ejection fraction. She underwent a right modified radical mastectomy with axillary node dissection on 12/28/2010 which revealed no evidence of residual infiltrating ductal carcinoma, but 20/21 lymph nodes were positive for metastatic disease with components of extracapsular extension. She completed radiation therapy to the right chest wall, right axilla and supraclavicular region under the care of Dr. Roselind Messier on 03/07/2011. After improvement of her left ventricular ejection fraction, Herceptin has been resumed, due for next every 3 week dose today. She continues on Femara 2.5 mg by mouth daily.  Interim History:   Jeanette Morris is seen today prior to her next every 2 week dose of Herceptin. She voices no complaints whatsoever. She is feeling quite well. She continues on Femara, and denies any fevers, chills, night sweats, no shortness of breath or chest pain. She denies nausea, emesis, diarrhea or constipation. Energy level is excellent. She occasionally experiences discomfort over her postmastectomy and radiation site, she utilizes Percocet sparingly. She is unaware of any chest wall masses or nodules. 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 Is not affecting her ADLs. Her energy level continues to improve. She states she'll be due for her next 2-D echocardiogram under the care of [name] in April of 2013.  A detailed review of systems is otherwise noncontributory as noted below.  Review  of Systems: Constitutional:  no weight loss, fever, night sweats and feels well Eyes: uses glasses ENT: no complaints Cardiovascular: no chest pain or dyspnea on exertion Respiratory: no cough, shortness of breath, or wheezing Neurological: no TIA or stroke symptoms Dermatological: negative Gastrointestinal: no abdominal pain, change in bowel habits, or black or bloody stools Genito-Urinary: no dysuria, trouble voiding, or hematuria Hematological and Lymphatic: negative Breast: negative Musculoskeletal: negative Remaining ROS negative.   Medications:   I have reviewed the patient's current medications.  Current Outpatient Prescriptions  Medication Sig Dispense Refill  . aspirin 81 MG tablet Take 81 mg by mouth daily.        . Calcium Carbonate-Vitamin D 600-400 MG-UNIT per tablet Take 1 tablet by mouth daily.        . carvedilol (COREG) 12.5 MG tablet Take 1 tablet (12.5 mg total) by mouth 2 (two) times daily with a meal.  60 tablet  6  . ferrous sulfate 325 (65 FE) MG tablet Take 325 mg by mouth daily with breakfast.        . gabapentin (NEURONTIN) 300 MG capsule Take 300 mg by mouth at bedtime.        Marland Kitchen letrozole (FEMARA) 2.5 MG tablet Take 1 tablet (2.5 mg total) by mouth daily.  30 tablet  4  . lisinopril (PRINIVIL,ZESTRIL) 10 MG tablet Take 1 tablet (10 mg total) by mouth 2 (two) times daily.  60 tablet  6  . oxyCODONE-acetaminophen (PERCOCET) 5-325 MG per tablet Take 1 tablet by mouth every 6 (six) hours as needed.      . simvastatin (ZOCOR) 10 MG tablet Take 10 mg by mouth at bedtime.  Current Facility-Administered Medications  Medication Dose Route Frequency Provider Last Rate Last Dose  . lidocaine-prilocaine (EMLA) cream   Topical Once Amada Kingfisher, PA       Facility-Administered Medications Ordered in Other Visits  Medication Dose Route Frequency Provider Last Rate Last Dose  . lidocaine-prilocaine (EMLA) cream   Topical Once Pierce Crane, MD         Allergies: No Known Allergies  Physical Exam: Filed Vitals:   10/24/11 1107  BP: 152/77  Pulse: 75  Temp: 98.9 F (37.2 C)  Weight: 251 lbs. HEENT:  Sclerae anicteric, conjunctivae pink.  Oropharynx clear.  No mucositis or candidiasis.   Nodes:  No cervical, supraclavicular, or axillary lymphadenopathy palpated.  Breast Exam: Right breast is surgically absent, mastectomy scar is healed well. There is no evidence of skin nodules or masses, hyperpigmentation from prior radiation is still present. Axilla is clear. The left breast was examined, is free of any dominant mass effect no nipple inversion or discharge. There is some evidence of skin changes inferiorly consistent with a possible candidal component to the skin. No axillary fullness and adenopathy. Lungs:  Clear to auscultation bilaterally.  No crackles, rhonchi, or wheezes.   Heart:  Regular rate and rhythm.   Abdomen:  Soft, nontender.  Positive bowel sounds.  No organomegaly or masses palpated.   Musculoskeletal:  No focal spinal tenderness to palpation.  Extremities:  Benign.  No peripheral edema or cyanosis.   Skin:  Benign.   Neuro:  Nonfocal, alert and oriented x 3.  Lab Results: Lab Results  Component Value Date   WBC 4.3 10/24/2011   HGB 10.0* 10/24/2011   HCT 30.4* 10/24/2011   MCV 93.0 10/24/2011   PLT 186 10/24/2011   NEUTROABS 2.8 10/24/2011     Chemistry      Component Value Date/Time   NA 137 05/06/2011 0725   K 4.2 05/06/2011 0725   CL 102 05/06/2011 0725   CO2 27 05/06/2011 0725   BUN 20 05/06/2011 0725   CREATININE 1.07 05/06/2011 0725      Component Value Date/Time   CALCIUM 9.0 05/06/2011 0725   ALKPHOS 57 05/06/2011 0725   AST 17 05/06/2011 0725   ALT 12 05/06/2011 0725   BILITOT 0.4 05/06/2011 0725        Radiological Studies: 09/22/11 Study Conclusions - Left ventricle: Technically difficult study. The cavity size was normal. Wall thickness was increased in a pattern of moderate LVH. The estimated ejection  fraction was 55%. Wall motion was normal; there were no regional wall motion abnormalities. - Impressions: Compared to the images of 01/2011, I do believe that overall LV function looks a little better. Impressions: - Compared to the images of 01/2011, I do believe that overall LV function looks a little better. 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 The Herceptin was held due to low ejection fraction. She underwent a right modified radical mastectomy with axillary node dissection on 12/28/2010 which revealed no evidence of residual infiltrating ductal carcinoma, but 20/21 lymph nodes were positive for metastatic disease with components of extracapsular extension. She completed radiation therapy to the right chest wall, right axilla and supraclavicular region under the care  of Dr. Roselind Messier on 03/07/2011. After improvement of her left ventricular ejection fraction, Herceptin has been resumed, due for next every 3 week dose today. She continues on Femara 2.5 mg by mouth daily.  Case to be reviewed with Dr. Pierce Crane.    Plan:  Ms. Brimley will continue on Femara 2.5 mg per day. She will receive her Herceptin today as scheduled at 6 mg per kilogram dosing. I will see her in 3 weeks' time prior to her next Herceptin dosing. She knows to contact us prior if the need should arise.   This plan was reviewed with the patient, who voices understanding and agreement.  She knows to call with any changes or problems.    Trula Frede T, PA-C 10/24/2011

## 2011-10-24 NOTE — Patient Instructions (Addendum)
[facility] Discharge Instructions for Patients Receiving Chemotherapy  Today you received the following chemotherapy agents Herceptin To help prevent nausea and vomiting after your treatment, we encourage you to take your nausea medication [name].   If you develop nausea and vomiting that is not controlled by your nausea medication, call the clinic. If it is after clinic hours your family physician or the after hours number for the clinic or go to the Emergency Department.   BELOW ARE SYMPTOMS THAT SHOULD BE REPORTED IMMEDIATELY:  *FEVER GREATER THAN 100.5 F  *CHILLS WITH OR WITHOUT FEVER  NAUSEA AND VOMITING THAT IS NOT CONTROLLED WITH YOUR NAUSEA MEDICATION  *UNUSUAL SHORTNESS OF BREATH  *UNUSUAL BRUISING OR BLEEDING  TENDERNESS IN MOUTH AND THROAT WITH OR WITHOUT PRESENCE OF ULCERS  *URINARY PROBLEMS  *BOWEL PROBLEMS  UNUSUAL RASH Items with * indicate a potential emergency and should be followed up as soon as possible.  . Feel free to call the clinic you have any questions or concerns. The clinic phone number is [phone].   I have been informed and understand all the instructions given to me. I know to contact the clinic, my physician, or go to the Emergency Department if any problems should occur. I do not have any questions at this time, but understand that I may call the clinic during office hours   should I have any questions or need assistance in obtaining follow up care.    __________________________________________  _____________  __________ Signature of Patient or Authorized Representative            Date                   Time    __________________________________________ Nurse's Signature

## 2011-11-14 ENCOUNTER — Other Ambulatory Visit (HOSPITAL_BASED_OUTPATIENT_CLINIC_OR_DEPARTMENT_OTHER): Payer: Medicare Other | Admitting: Lab

## 2011-11-14 ENCOUNTER — Ambulatory Visit (HOSPITAL_BASED_OUTPATIENT_CLINIC_OR_DEPARTMENT_OTHER): Payer: Medicare Other | Admitting: Physician Assistant

## 2011-11-14 ENCOUNTER — Encounter: Payer: Self-pay | Admitting: Physician Assistant

## 2011-11-14 ENCOUNTER — Telehealth: Payer: Self-pay | Admitting: Oncology

## 2011-11-14 ENCOUNTER — Ambulatory Visit (HOSPITAL_BASED_OUTPATIENT_CLINIC_OR_DEPARTMENT_OTHER): Payer: Medicaid Other

## 2011-11-14 VITALS — BP 166/84 | HR 93 | Temp 98.4°F | Ht 65.0 in | Wt 243.6 lb

## 2011-11-14 DIAGNOSIS — Z170 Estrogen receptor positive status [ER+]: Secondary | ICD-10-CM

## 2011-11-14 DIAGNOSIS — Z5112 Encounter for antineoplastic immunotherapy: Secondary | ICD-10-CM

## 2011-11-14 DIAGNOSIS — C50919 Malignant neoplasm of unspecified site of unspecified female breast: Secondary | ICD-10-CM

## 2011-11-14 LAB — CBC WITH DIFFERENTIAL/PLATELET
Basophils Absolute: 0 10*3/uL (ref 0.0–0.1)
Eosinophils Absolute: 0 10*3/uL (ref 0.0–0.5)
HGB: 10.1 g/dL — ABNORMAL LOW (ref 11.6–15.9)
NEUT#: 2.2 10*3/uL (ref 1.5–6.5)
RDW: 12.7 % (ref 11.2–14.5)
lymph#: 1 10*3/uL (ref 0.9–3.3)

## 2011-11-14 MED ORDER — ACETAMINOPHEN 325 MG PO TABS
650.0000 mg | ORAL_TABLET | Freq: Once | ORAL | Status: AC
  Administered 2011-11-14: 650 mg via ORAL

## 2011-11-14 MED ORDER — SODIUM CHLORIDE 0.9 % IJ SOLN
10.0000 mL | INTRAMUSCULAR | Status: DC | PRN
  Administered 2011-11-14: 10 mL
  Filled 2011-11-14: qty 10

## 2011-11-14 MED ORDER — SODIUM CHLORIDE 0.9 % IV SOLN
6.0000 mg/kg | Freq: Once | INTRAVENOUS | Status: AC
  Administered 2011-11-14: 672 mg via INTRAVENOUS
  Filled 2011-11-14: qty 32

## 2011-11-14 MED ORDER — SODIUM CHLORIDE 0.9 % IV SOLN
Freq: Once | INTRAVENOUS | Status: AC
  Administered 2011-11-14: 12:00:00 via INTRAVENOUS

## 2011-11-14 MED ORDER — DIPHENHYDRAMINE HCL 25 MG PO CAPS
50.0000 mg | ORAL_CAPSULE | Freq: Once | ORAL | Status: AC
  Administered 2011-11-14: 50 mg via ORAL

## 2011-11-14 MED ORDER — HEPARIN SOD (PORK) LOCK FLUSH 100 UNIT/ML IV SOLN
500.0000 [IU] | Freq: Once | INTRAVENOUS | Status: AC | PRN
  Administered 2011-11-14: 500 [IU]
  Filled 2011-11-14: qty 5

## 2011-11-14 NOTE — Telephone Encounter (Signed)
gve the pt her April 2013 appt calendar 

## 2011-11-14 NOTE — Progress Notes (Signed)
Hematology and Oncology Follow Up Visit  Jeanette Morris [id number] 1946/07/31 66 y.o. 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 The Herceptin was held due to low ejection fraction. She underwent a right modified radical mastectomy with axillary node dissection on 12/28/2010 which revealed no evidence of residual infiltrating ductal carcinoma, but 20/21 lymph nodes were positive for metastatic disease with components of extracapsular extension. She completed radiation therapy to the right chest wall, right axilla and supraclavicular region under the care of Dr. Roselind Messier on 03/07/2011. After improvement of her left ventricular ejection fraction, Herceptin has been resumed, due for next every 3 week dose today. She continues on Femara 2.5 mg by mouth daily.  She states she'll be due for her next 2-D echocardiogram under the care of [name] in April of 2013.  Interim History:   Jeanette Morris returns today accompanied by her good friend for followup of her right breast carcinoma. She is due for her next few three-week dose of trastuzumab today. She continues on letrozole 2.5 mg daily. She notices no side effects associated with either of these agents. Specifically, no hot flashes, increased joint pain, or vaginal dryness. Her energy level is good. She's eating and drinking well, with no nausea, emesis, or change in bowel habits. No fevers, chills, or night sweats. No shortness of breath. No abnormal headaches.  Jeanette Morris does have what looks like a "trigger finger" with her left thumb. This is a little worse in the mornings and feels very stiff. None of the other fingers are affected over either hand. She denies any additional joint  pain or problem.  A detailed review of systems is otherwise noncontributory as noted below.  Review of Systems: Constitutional:  no weight loss, fever, night sweats and feels well Eyes: uses glasses ENT: no complaints Cardiovascular: no chest pain or dyspnea on exertion Respiratory: no cough, shortness of breath, or wheezing Neurological: no TIA or stroke symptoms Dermatological: negative Gastrointestinal: no abdominal pain, change in bowel habits, or black or bloody stools Genito-Urinary: no dysuria, trouble voiding, or hematuria Hematological and Lymphatic: negative Breast: negative Musculoskeletal: negative except for stiffness and tightness in thumb joint, left hand Remaining ROS negative.   Medications:   I have reviewed the patient's current medications.  Current Outpatient Prescriptions  Medication Sig Dispense Refill  . aspirin 81 MG tablet Take 81 mg by mouth daily.        . Calcium Carbonate-Vitamin D 600-400 MG-UNIT per tablet Take 1 tablet by mouth daily.        . carvedilol (COREG) 12.5 MG tablet Take 1 tablet (12.5 mg total) by mouth 2 (two) times daily with a meal.  60 tablet  6  . ferrous sulfate 325 (65 FE) MG tablet Take 325 mg by mouth daily with breakfast.        . gabapentin (NEURONTIN) 300 MG capsule Take 300 mg by mouth at bedtime.        Marland Kitchen letrozole (FEMARA) 2.5 MG tablet Take 1 tablet (2.5 mg total) by mouth daily.  30 tablet  4  . lisinopril (PRINIVIL,ZESTRIL) 10 MG tablet Take 1 tablet (10 mg total) by mouth 2 (two) times daily.  60 tablet  6  . oxyCODONE-acetaminophen (PERCOCET) 5-325 MG per tablet  Take 1 tablet by mouth every 6 (six) hours as needed.      . simvastatin (ZOCOR) 10 MG tablet Take 10 mg by mouth at bedtime.         No current facility-administered medications for this visit.   Facility-Administered Medications Ordered in Other Visits  Medication Dose Route Frequency Provider Last Rate Last Dose  . 0.9 %  sodium chloride infusion    Intravenous Once Pierce Crane, MD 20 mL/hr at 11/14/11 1205    . acetaminophen (TYLENOL) tablet 650 mg  650 mg Oral Once Pierce Crane, MD   650 mg at 11/14/11 1205  . diphenhydrAMINE (BENADRYL) capsule 50 mg  50 mg Oral Once Pierce Crane, MD   50 mg at 11/14/11 1205  . heparin lock flush 100 unit/mL  500 Units Intracatheter Once PRN Pierce Crane, MD      . lidocaine-prilocaine (EMLA) cream   Topical Once Pierce Crane, MD      . sodium chloride 0.9 % injection 10 mL  10 mL Intracatheter PRN Pierce Crane, MD      . trastuzumab (HERCEPTIN) 672 mg in sodium chloride 0.9 % 250 mL chemo infusion  6 mg/kg (Treatment Plan Actual) Intravenous Once Pierce Crane, MD        Allergies: No Known Allergies  Physical Exam: Filed Vitals:   11/14/11 1135  BP: 166/84  Pulse: 93  Temp: 98.4 F (36.9 C)  Weight: 251 lbs. HEENT:  Sclerae anicteric, conjunctivae pink.  Oropharynx clear.  No mucositis or candidiasis.   Nodes:  No cervical, supraclavicular, or axillary lymphadenopathy palpated.  Breast Exam: Deferred Lungs:  Clear to auscultation bilaterally.  No crackles, rhonchi, or wheezes.   Heart:  Regular rate and rhythm.   Abdomen:  Soft, obese, nontender.  Positive bowel sounds.  No organomegaly or masses palpated.   Musculoskeletal:  No focal spinal tenderness to palpation.  Extremities:  Benign.  No peripheral edema or cyanosis.   Skin:  Benign.   Neuro:  Nonfocal, alert and oriented x 3.  Lab Results: Lab Results  Component Value Date   WBC 3.5* 11/14/2011   HGB 10.1* 11/14/2011   HCT 30.7* 11/14/2011   MCV 92.5 11/14/2011   PLT 175 11/14/2011   NEUTROABS 2.2 11/14/2011     Chemistry      Component Value Date/Time   NA 139 10/24/2011 1058   K 4.2 10/24/2011 1058   CL 103 10/24/2011 1058   CO2 29 10/24/2011 1058   BUN 18 10/24/2011 1058   CREATININE 0.99 10/24/2011 1058      Component Value Date/Time   CALCIUM 9.4 10/24/2011 1058   ALKPHOS 67 10/24/2011 1058   AST 17 10/24/2011 1058   ALT 13  10/24/2011 1058   BILITOT 0.4 10/24/2011 1058        Radiological Studies: 09/22/11 Study Conclusions - Left ventricle: Technically difficult study. The cavity size was normal. Wall thickness was increased in a pattern of moderate LVH. The estimated ejection fraction was 55%. Wall motion was normal; there were no regional wall motion abnormalities. - Impressions: Compared to the images of 01/2011, I do believe that overall LV function looks a little better. Impressions: - Compared to the images of 01/2011, I do believe that overall LV function looks a little better. Prepared and Electronically Authenticated by  Willa Rough, MD, [facility]   Assessment:  Jeanette Morris is a 66 year old [place] woman with a history of an ER/PR positive HER-2 positive inflammatory carcinoma of the  right breast for which she underwent 4 cycles of neoadjuvant every 3 week Taxotere/Cytoxan/Herceptin followed by carboplatinum/Taxotere for at total of 6 cycles. The Herceptin was held due to low ejection fraction. She underwent a right modified radical mastectomy with axillary node dissection on 12/28/2010 which revealed no evidence of residual infiltrating ductal carcinoma, but 20/21 lymph nodes were positive for metastatic disease with components of extracapsular extension. She completed radiation therapy to the right chest wall, right axilla and supraclavicular region under the care of Dr. Roselind Messier on 03/07/2011. After improvement of her left ventricular ejection fraction, Herceptin has been resumed, due for next every 3 week dose today. She continues on Femara 2.5 mg by mouth daily.  Case to be reviewed with Dr. Pierce Crane.    Plan:  Jeanette Morris will continue on Femara 2.5 mg per day. She will receive her Herceptin today as scheduled at 6 mg per kilogram dosing. She will return to see Agustina Caroli is rer in 3 weeks' time prior to her next Herceptin dosing. She knows to contact us prior if the need  should arise.   This plan was reviewed with the patient, who voices understanding and agreement.  She knows to call with any changes or problems.    Jacorie Ernsberger, PA-C 11/14/2011

## 2011-11-24 ENCOUNTER — Other Ambulatory Visit (HOSPITAL_COMMUNITY): Payer: Self-pay | Admitting: *Deleted

## 2011-11-24 MED ORDER — CARVEDILOL 12.5 MG PO TABS: 12.5000 mg | ORAL_TABLET | Freq: Two times a day (BID) | ORAL | Status: DC

## 2011-12-05 ENCOUNTER — Other Ambulatory Visit (HOSPITAL_BASED_OUTPATIENT_CLINIC_OR_DEPARTMENT_OTHER): Payer: Medicare Other | Admitting: Lab

## 2011-12-05 ENCOUNTER — Ambulatory Visit (HOSPITAL_BASED_OUTPATIENT_CLINIC_OR_DEPARTMENT_OTHER): Payer: Medicaid Other

## 2011-12-05 ENCOUNTER — Telehealth: Payer: Self-pay | Admitting: Oncology

## 2011-12-05 ENCOUNTER — Ambulatory Visit (HOSPITAL_BASED_OUTPATIENT_CLINIC_OR_DEPARTMENT_OTHER): Payer: Medicare Other | Admitting: Physician Assistant

## 2011-12-05 ENCOUNTER — Encounter: Payer: Self-pay | Admitting: Physician Assistant

## 2011-12-05 VITALS — BP 142/85 | HR 83 | Temp 98.6°F | Ht 65.0 in | Wt 244.0 lb

## 2011-12-05 DIAGNOSIS — C50919 Malignant neoplasm of unspecified site of unspecified female breast: Secondary | ICD-10-CM

## 2011-12-05 DIAGNOSIS — M79609 Pain in unspecified limb: Secondary | ICD-10-CM

## 2011-12-05 DIAGNOSIS — Z170 Estrogen receptor positive status [ER+]: Secondary | ICD-10-CM

## 2011-12-05 DIAGNOSIS — Z79811 Long term (current) use of aromatase inhibitors: Secondary | ICD-10-CM

## 2011-12-05 DIAGNOSIS — Z5112 Encounter for antineoplastic immunotherapy: Secondary | ICD-10-CM

## 2011-12-05 DIAGNOSIS — M79644 Pain in right finger(s): Secondary | ICD-10-CM

## 2011-12-05 LAB — CBC WITH DIFFERENTIAL/PLATELET
Basophils Absolute: 0 10*3/uL (ref 0.0–0.1)
Eosinophils Absolute: 0.1 10*3/uL (ref 0.0–0.5)
HGB: 9.9 g/dL — ABNORMAL LOW (ref 11.6–15.9)
MONO#: 0.3 10*3/uL (ref 0.1–0.9)
NEUT#: 2.2 10*3/uL (ref 1.5–6.5)
RDW: 12.4 % (ref 11.2–14.5)
WBC: 3.5 10*3/uL — ABNORMAL LOW (ref 3.9–10.3)
lymph#: 0.9 10*3/uL (ref 0.9–3.3)

## 2011-12-05 MED ORDER — ACETAMINOPHEN 325 MG PO TABS
650.0000 mg | ORAL_TABLET | Freq: Once | ORAL | Status: AC
  Administered 2011-12-05: 650 mg via ORAL

## 2011-12-05 MED ORDER — DIPHENHYDRAMINE HCL 25 MG PO CAPS
50.0000 mg | ORAL_CAPSULE | Freq: Once | ORAL | Status: AC
  Administered 2011-12-05: 50 mg via ORAL

## 2011-12-05 MED ORDER — HEPARIN SOD (PORK) LOCK FLUSH 100 UNIT/ML IV SOLN
500.0000 [IU] | Freq: Once | INTRAVENOUS | Status: AC | PRN
  Administered 2011-12-05: 500 [IU]
  Filled 2011-12-05: qty 5

## 2011-12-05 MED ORDER — TRASTUZUMAB CHEMO INJECTION 440 MG
6.0000 mg/kg | Freq: Once | INTRAVENOUS | Status: AC
  Administered 2011-12-05: 672 mg via INTRAVENOUS
  Filled 2011-12-05: qty 32

## 2011-12-05 MED ORDER — SODIUM CHLORIDE 0.9 % IJ SOLN
10.0000 mL | INTRAMUSCULAR | Status: DC | PRN
  Administered 2011-12-05: 10 mL
  Filled 2011-12-05: qty 10

## 2011-12-05 MED ORDER — SODIUM CHLORIDE 0.9 % IV SOLN
Freq: Once | INTRAVENOUS | Status: AC
  Administered 2011-12-05: 12:00:00 via INTRAVENOUS

## 2011-12-05 NOTE — Patient Instructions (Signed)
[facility] Discharge Instructions for Patients Receiving Chemotherapy  Today you received the following chemotherapy agents Herceptin. To help prevent nausea and vomiting after your treatment, we encourage you to take your nausea medication per MD instructions. Begin taking it as directed.   If you develop nausea and vomiting that is not controlled by your nausea medication, call the clinic. If it is after clinic hours your family physician or the after hours number for the clinic or go to the Emergency Department.   BELOW ARE SYMPTOMS THAT SHOULD BE REPORTED IMMEDIATELY:  *FEVER GREATER THAN 100.5 F  *CHILLS WITH OR WITHOUT FEVER  NAUSEA AND VOMITING THAT IS NOT CONTROLLED WITH YOUR NAUSEA MEDICATION  *UNUSUAL SHORTNESS OF BREATH  *UNUSUAL BRUISING OR BLEEDING  TENDERNESS IN MOUTH AND THROAT WITH OR WITHOUT PRESENCE OF ULCERS  *URINARY PROBLEMS  *BOWEL PROBLEMS  UNUSUAL RASH Items with * indicate a potential emergency and should be followed up as soon as possible.  One of the nurses will contact you 24 hours after your treatment. Please let the nurse know about any problems that you may have experienced. Feel free to call the clinic you have any questions or concerns. The clinic phone number is [phone].   I have been informed and understand all the instructions given to me. I know to contact the clinic, my physician, or go to the Emergency Department if any problems should occur. I do not have any questions at this time, but understand that I may call the clinic during office hours   should I have any questions or need assistance in obtaining follow up care.    __________________________________________  _____________  __________ Signature of Patient or Authorized Representative            Date                   Time    __________________________________________ Nurse's Signature

## 2011-12-05 NOTE — Telephone Encounter (Signed)
gve the pt her x-ray referral of the rt hand. Pt is aware she will be contacted regarding the appt with the physical therapy dept. S/w marti from the physical therapy dept and she is aware of the referral in epic

## 2011-12-05 NOTE — Progress Notes (Signed)
Hematology and Oncology Follow Up Visit  Jeanette Morris [id number] 08/15/1946 66 y.o. 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 The Herceptin was held due to low ejection fraction. She underwent a right modified radical mastectomy with axillary node dissection on 12/28/2010 which revealed no evidence of residual infiltrating ductal carcinoma, but 20/21 lymph nodes were positive for metastatic disease with components of extracapsular extension. She completed radiation therapy to the right chest wall, right axilla and supraclavicular region under the care of Dr. Roselind Messier on 03/07/2011. After improvement of her left ventricular ejection fraction, Herceptin has been resumed, due for next every 3 week dose today. She continues on Femara 2.5 mg by mouth daily, due for her next every 3 week dose of Herceptin.  Interim History:   Jeanette Morris is seen today prior to her next every 3 week dose of Herceptin. She really voices no specific complaints, that she noticed that her right thumb is still causing issues. She has been unaware of any frank lymphedema of the right upper extremity, but it does appear that she has a little present today. She denies any fevers, chills, or night sweats. No nausea, emesis, diarrhea or constipation issues. She denies any diffuse joint sensitivity. She denies any frank bone pain though she does occasionally have pain in her surgical site for which she utilizes Percocet appropriately. Her energy level is good, and she notes she sees [name] for cardiology followup on 12/22/2011 including echocardiogram. A detailed review of systems is otherwise noncontributory as noted below.  Review of  Systems: Constitutional:  no weight loss, fever, night sweats and feels well Eyes: uses glasses ENT: no complaints Cardiovascular: no chest pain or dyspnea on exertion Respiratory: no cough, shortness of breath, or wheezing Neurological: no TIA or stroke symptoms Dermatological: negative Gastrointestinal: no abdominal pain, change in bowel habits, or black or bloody stools Genito-Urinary: no dysuria, trouble voiding, or hematuria Hematological and Lymphatic: negative Breast: negative Musculoskeletal: Pain in the right thumb along with stiffness. Remaining ROS negative.   Medications:   I have reviewed the patient's current medications.  Current Outpatient Prescriptions  Medication Sig Dispense Refill  . aspirin 81 MG tablet Take 81 mg by mouth daily.        . Calcium Carbonate-Vitamin D 600-400 MG-UNIT per tablet Take 1 tablet by mouth daily.        . carvedilol (COREG) 12.5 MG tablet Take 1 tablet (12.5 mg total) by mouth 2 (two) times daily with a meal.  60 tablet  6  . ferrous sulfate 325 (65 FE) MG tablet Take 325 mg by mouth daily with breakfast.        . gabapentin (NEURONTIN) 300 MG capsule Take 300 mg by mouth at bedtime.        Marland Kitchen letrozole (FEMARA) 2.5 MG tablet Take 1 tablet (2.5 mg total) by mouth daily.  30 tablet  4  . lisinopril (PRINIVIL,ZESTRIL) 10 MG tablet Take 1 tablet (10 mg total) by mouth 2 (two) times daily.  60 tablet  6  . oxyCODONE-acetaminophen (PERCOCET) 5-325 MG per tablet Take 1 tablet by mouth every 6 (six) hours as needed. 1-2 po q6-8h prn pain-#90 12/05/11-CTS      . simvastatin (ZOCOR) 10 MG tablet Take  10 mg by mouth at bedtime.         No current facility-administered medications for this visit.   Facility-Administered Medications Ordered in Other Visits  Medication Dose Route Frequency Provider Last Rate Last Dose  . lidocaine-prilocaine (EMLA) cream   Topical Once Pierce Crane, MD        Allergies: No Known Allergies  Physical Exam: Filed  Vitals:   12/05/11 1045  BP: 142/85  Pulse: 83  Temp: 98.6 F (37 C)  Weight: 244 lbs. HEENT:  Sclerae anicteric, conjunctivae pink.  Oropharynx clear.  No mucositis or candidiasis.   Nodes:  No cervical, supraclavicular, or axillary lymphadenopathy palpated.  Breast Exam: Deferred. Lungs:  Clear to auscultation bilaterally.  No crackles, rhonchi, or wheezes.   Heart:  Regular rate and rhythm.   Abdomen:  Soft, nontender.  Positive bowel sounds.  No organomegaly or masses palpated.   Musculoskeletal:  No focal spinal tenderness to palpation.  Extremities:  Benign, except there is evidence to suggest very mild right upper extremity lymphedema. No obvious deformity is noted of the right thumb though does have some decreased range of motion. No erythema.  Skin:  Benign.   Neuro:  Nonfocal, alert and oriented x 3.  Lab Results: Lab Results  Component Value Date   WBC 3.5* 12/05/2011   HGB 9.9* 12/05/2011   HCT 30.4* 12/05/2011   MCV 92.4 12/05/2011   PLT 169 12/05/2011   NEUTROABS 2.2 12/05/2011     Chemistry      Component Value Date/Time   NA 139 10/24/2011 1058   K 4.2 10/24/2011 1058   CL 103 10/24/2011 1058   CO2 29 10/24/2011 1058   BUN 18 10/24/2011 1058   CREATININE 0.99 10/24/2011 1058      Component Value Date/Time   CALCIUM 9.4 10/24/2011 1058   ALKPHOS 67 10/24/2011 1058   AST 17 10/24/2011 1058   ALT 13 10/24/2011 1058   BILITOT 0.4 10/24/2011 1058        Radiological Studies: 09/22/11 Study Conclusions - Left ventricle: Technically difficult study. The cavity size was normal. Wall thickness was increased in a pattern of moderate LVH. The estimated ejection fraction was 55%. Wall motion was normal; there were no regional wall motion abnormalities. - Impressions: Compared to the images of 01/2011, I do believe that overall LV function looks a little better. Impressions: - Compared to the images of 01/2011, I do believe that overall LV function looks a little  better. 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 The Herceptin was held due to low ejection fraction. She underwent a right modified radical mastectomy with axillary node dissection on 12/28/2010 which revealed no evidence of residual infiltrating ductal carcinoma, but 20/21 lymph nodes were positive for metastatic disease with components of extracapsular extension. She completed radiation therapy to the right chest wall, right axilla and supraclavicular region under the care of Dr. Roselind Messier on 03/07/2011. After improvement of her left ventricular ejection fraction, Herceptin has been resumed, due for next every 3 week dose today. She continues on Femara 2.5 mg by mouth daily.  2. Mild right upper extremity lymphedema.  3. Pain in right thumb.  Case to be reviewed  with Dr. Pierce Crane.    Plan:  Ms. Sawatzky will continue on Femara 2.5 mg per day. She will receive her Herceptin today as scheduled at 6 mg per kilogram dosing. I will set her up to receive a plain film of the hand with attention to the right thumb, but she will also followup with her primary care in this regards. I have refilled her Percocet 5/325 #90 dispensed. We will refer her to the [facility] on [facility] for her right upper extremity lymphedema. She will keep her appointment as scheduled with [name] on 12/22/2011.  I will see her in 3 weeks' time prior to her next Herceptin dosing. She knows to contact us prior if the need should arise.   This plan was reviewed with the patient, who voices understanding and agreement.  She knows to call with any changes or problems.     Jazmarie Biever T, PA-C 12/05/2011

## 2011-12-07 ENCOUNTER — Ambulatory Visit (HOSPITAL_COMMUNITY)
Admission: RE | Admit: 2011-12-07 | Discharge: 2011-12-07 | Disposition: A | Discharge status: Home / Self Care | Payer: Medicare Other | Source: Ambulatory Visit | Attending: Physician Assistant | Admitting: Physician Assistant

## 2011-12-07 ENCOUNTER — Other Ambulatory Visit: Payer: Self-pay | Admitting: Physician Assistant

## 2011-12-07 DIAGNOSIS — M899 Disorder of bone, unspecified: Secondary | ICD-10-CM | POA: Insufficient documentation

## 2011-12-07 DIAGNOSIS — M79644 Pain in right finger(s): Secondary | ICD-10-CM

## 2011-12-07 DIAGNOSIS — M949 Disorder of cartilage, unspecified: Secondary | ICD-10-CM | POA: Insufficient documentation

## 2011-12-07 DIAGNOSIS — M79609 Pain in unspecified limb: Secondary | ICD-10-CM | POA: Insufficient documentation

## 2011-12-16 ENCOUNTER — Ambulatory Visit: Payer: Medicare Other | Attending: Physician Assistant | Admitting: Physical Therapy

## 2011-12-16 DIAGNOSIS — IMO0001 Reserved for inherently not codable concepts without codable children: Secondary | ICD-10-CM | POA: Insufficient documentation

## 2011-12-16 DIAGNOSIS — I890 Lymphedema, not elsewhere classified: Secondary | ICD-10-CM | POA: Insufficient documentation

## 2011-12-16 DIAGNOSIS — M24519 Contracture, unspecified shoulder: Secondary | ICD-10-CM | POA: Insufficient documentation

## 2011-12-19 ENCOUNTER — Ambulatory Visit: Payer: Medicare Other | Admitting: Physical Therapy

## 2011-12-19 NOTE — Progress Notes (Signed)
Encounter addended by: Noralee Space, RN on: 12/19/2011 12:00 PM<BR>     Documentation filed: Orders

## 2011-12-20 ENCOUNTER — Encounter (INDEPENDENT_AMBULATORY_CARE_PROVIDER_SITE_OTHER): Payer: Self-pay | Admitting: General Surgery

## 2011-12-20 ENCOUNTER — Encounter (INDEPENDENT_AMBULATORY_CARE_PROVIDER_SITE_OTHER): Payer: Medicare Other | Admitting: General Surgery

## 2011-12-21 ENCOUNTER — Ambulatory Visit (HOSPITAL_COMMUNITY)
Admission: RE | Admit: 2011-12-21 | Discharge: 2011-12-21 | Disposition: A | Discharge status: Home / Self Care | Payer: Medicare Other | Source: Ambulatory Visit | Attending: Internal Medicine | Admitting: Internal Medicine

## 2011-12-21 DIAGNOSIS — E785 Hyperlipidemia, unspecified: Secondary | ICD-10-CM | POA: Insufficient documentation

## 2011-12-21 DIAGNOSIS — Z09 Encounter for follow-up examination after completed treatment for conditions other than malignant neoplasm: Secondary | ICD-10-CM | POA: Insufficient documentation

## 2011-12-21 DIAGNOSIS — I10 Essential (primary) hypertension: Secondary | ICD-10-CM | POA: Insufficient documentation

## 2011-12-21 DIAGNOSIS — I059 Rheumatic mitral valve disease, unspecified: Secondary | ICD-10-CM | POA: Insufficient documentation

## 2011-12-21 DIAGNOSIS — Z9010 Acquired absence of unspecified breast and nipple: Secondary | ICD-10-CM | POA: Insufficient documentation

## 2011-12-21 DIAGNOSIS — Z853 Personal history of malignant neoplasm of breast: Secondary | ICD-10-CM | POA: Insufficient documentation

## 2011-12-21 DIAGNOSIS — C50919 Malignant neoplasm of unspecified site of unspecified female breast: Secondary | ICD-10-CM

## 2011-12-21 NOTE — Progress Notes (Signed)
  Echocardiogram 2D Echocardiogram has been performed.  Jeanette Morris 12/21/2011, 11:35 AM

## 2011-12-22 ENCOUNTER — Ambulatory Visit (HOSPITAL_COMMUNITY)
Admission: RE | Admit: 2011-12-22 | Discharge: 2011-12-22 | Disposition: A | Discharge status: Home / Self Care | Payer: Medicare Other | Source: Ambulatory Visit | Attending: Internal Medicine | Admitting: Internal Medicine

## 2011-12-22 VITALS — BP 146/80 | HR 94 | Wt 248.0 lb

## 2011-12-22 DIAGNOSIS — C50919 Malignant neoplasm of unspecified site of unspecified female breast: Secondary | ICD-10-CM

## 2011-12-22 DIAGNOSIS — I10 Essential (primary) hypertension: Secondary | ICD-10-CM

## 2011-12-22 DIAGNOSIS — I428 Other cardiomyopathies: Secondary | ICD-10-CM | POA: Insufficient documentation

## 2011-12-22 DIAGNOSIS — I427 Cardiomyopathy due to drug and external agent: Secondary | ICD-10-CM

## 2011-12-22 DIAGNOSIS — I429 Cardiomyopathy, unspecified: Secondary | ICD-10-CM

## 2011-12-22 DIAGNOSIS — T451X5A Adverse effect of antineoplastic and immunosuppressive drugs, initial encounter: Secondary | ICD-10-CM

## 2011-12-22 MED ORDER — LISINOPRIL 20 MG PO TABS: 20.0000 mg | ORAL_TABLET | Freq: Two times a day (BID) | ORAL | Status: DC

## 2011-12-22 MED ORDER — LISINOPRIL 40 MG PO TABS: 40.0000 mg | ORAL_TABLET | Freq: Every day | ORAL | Status: DC

## 2011-12-22 NOTE — Assessment & Plan Note (Addendum)
Echo images reviewed with her today. LV appears mildly dillated and EF slightly down. Lateral s' appears significantly down. Unclear if this is due to chemo or poorly-controlled HTN. Will hold Herceptin for 2-3 cycles and work hard to get BP under control Goal SBP < 140 at all times. Increase lisinopril 40 daily. Repeat echo 6-8 weeks.

## 2011-12-22 NOTE — Patient Instructions (Signed)
Increase Lisinopril to 40 mg daily  Your physician recommends that you schedule a follow-up appointment in: 3 weeks with [facility]  Your physician has requested that you have an echocardiogram. Echocardiography is a painless test that uses sound waves to create images of your heart. It provides your doctor with information about the size and shape of your heart and how well your heart's chambers and valves are working. This procedure takes approximately one hour. There are no restrictions for this procedure.  IN 6 WEEKS.  Your physician recommends that you schedule a follow-up appointment in: 6 weeks with [facility]

## 2011-12-22 NOTE — Assessment & Plan Note (Addendum)
Poorly controlled - increase lisinopril to 40 daily. Continue carvedilol. Will see back in 3 weeks to re-evaluate BP. Repeat echo 6 weeks.

## 2011-12-22 NOTE — Progress Notes (Signed)
Patient ID: Jeanette Morris, female   DOB: 1945/09/27, 66 y.o.   MRN: [id number]  HPI:  Jeanette Morris is a 66 y/o woman with h/o HTN and obesity. Denies any known h/o CAD or HF.  She was diagnosed with R Breast CA in 9/11. Underwent neoadjuvant chemo with (TCH regimen) followed by carboplatin and Taxotere x 6 cycles. She is status post rt mastectomy in April 2012 LN+. ER/PR/NER 2-neu +. Post-op had XRT. [name] would like to start her on Herceptin. However had echocardiogram on 01/06/11 which was read as EF 45-50% and so referred for cardiology clearance.  Her echos have been reviewed, dating back to 2003. EF has remained in 45-50% range without significant change. Lateral S' velocity also unchanged around 10.5 cm/sec - although some echos with poor windows.  With her EF running on the low end of the normal range for almost a decade and the importance of the Herceptin therapy, I cleared her for Herceptin therapy.  Her first appt was scheduled for 9/7.  She has had a problem with HTN and there is a slightly increased risk for cardiotoxicity therefore we increased her lisinopril to 10 mg BID and changed her metoprolol to coreg 12.5 mg BID.  She was amlodipine but this was discontinued 04/2011 due to dizziness/HA.    Echos: 05/03/11: 55%, lateral s' velocity around 11.5 cm/sec.   09/22/11: 50-55%, lateral s' 10.5-11 but poor window 12/21/11: 45-50% LVIDd 5.0 cm (up from 4.4) Lat s' 6.7cm/sec (poor windows)  She returns for routine follow up today.  She was cleared to restart herceptin with close monitoring but she has failed to get in touch with [name] since her last visit. She has been back on Herceptin since January.  She is only taking lisinopril once a day. SBP recently 170.  Typically runs 140-150.  She feels well.  She denies edema, orthopnea, PND, SOB. syncope, dizziness or HA.  She walks up to the corner and turns around.   ROS: All other systems normal except as mentioned in HPI, past medical history  and problem list.    Past Medical History  Diagnosis Date  . Hypertension   . Hearing loss     right ear  . Hyperlipidemia   . Arthritis   . 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 Breast cancer     (Rt) breast ca dx 9/11    Current Outpatient Prescriptions  Medication Sig Dispense Refill  . aspirin 81 MG tablet Take 81 mg by mouth daily.        . Calcium Carbonate-Vitamin D 600-400 MG-UNIT per tablet Take 1 tablet by mouth daily.        . carvedilol (COREG) 12.5 MG tablet Take 1 tablet (12.5 mg total) by mouth 2 (two) times daily with a meal.  60 tablet  6  . ferrous sulfate 325 (65 FE) MG tablet Take 325 mg by mouth daily with breakfast.        . gabapentin (NEURONTIN) 300 MG capsule Take 300 mg by mouth at bedtime.        Marland Kitchen letrozole (FEMARA) 2.5 MG tablet Take 1 tablet (2.5 mg total) by mouth daily.  30 tablet  4  . lisinopril (PRINIVIL,ZESTRIL) 10 MG tablet Take 1 tablet (10 mg total) by mouth 2 (two) times daily.  60 tablet  6  . simvastatin (ZOCOR) 10 MG tablet Take 10 mg by mouth at bedtime.        Marland Kitchen  oxyCODONE-acetaminophen (PERCOCET) 5-325 MG per tablet Take 1 tablet by mouth every 6 (six) hours as needed. 1-2 po q6-8h prn pain-#90 12/05/11-CTS       No current facility-administered medications for this encounter.   Facility-Administered Medications Ordered in Other Encounters  Medication Dose Route Frequency Provider Last Rate Last Dose  . lidocaine-prilocaine (EMLA) cream   Topical Once Pierce Crane, MD         No Known Allergies   PHYSICAL EXAM: Filed Vitals:   12/22/11 0944  BP: 146/80  Pulse: 94  Weight: 248 lb (112.492 kg)  SpO2: 96%   General:  Obese. Well appearing. No respiratory difficulty HEENT: normal Neck: supple. no JVD. Carotids 2+ bilat; no bruits. No lymphadenopathy or thryomegaly appreciated. S/p R mastectomy Cor: PMI nondisplaced. Regular rate & rhythm. No rubs, gallops or murmurs. Lungs: clear Abdomen: obese soft, nontender,  nondistended. No hepatosplenomegaly. No bruits or masses. Good bowel sounds. Extremities: no cyanosis, clubbing, rash, edema Neuro: alert & oriented x 3, cranial nerves grossly intact. moves all 4 extremities w/o difficulty. Affect pleasant.    ASSESSMENT & PLAN:

## 2011-12-23 ENCOUNTER — Telehealth: Payer: Self-pay | Admitting: *Deleted

## 2011-12-23 NOTE — Telephone Encounter (Signed)
Patient called.  Her heart doctor cancelled her next couple herceptin.  Should she still come in to see Debbora Presto PA on Monday?   Yes, keep her appt. On Monday 4/29

## 2011-12-26 ENCOUNTER — Encounter: Payer: Self-pay | Admitting: Physician Assistant

## 2011-12-26 ENCOUNTER — Telehealth: Payer: Self-pay | Admitting: *Deleted

## 2011-12-26 ENCOUNTER — Ambulatory Visit (HOSPITAL_BASED_OUTPATIENT_CLINIC_OR_DEPARTMENT_OTHER): Payer: Medicare Other | Admitting: Physician Assistant

## 2011-12-26 ENCOUNTER — Ambulatory Visit: Payer: Medicare Other

## 2011-12-26 ENCOUNTER — Other Ambulatory Visit (HOSPITAL_BASED_OUTPATIENT_CLINIC_OR_DEPARTMENT_OTHER): Payer: Medicare Other | Admitting: Lab

## 2011-12-26 VITALS — BP 189/94 | HR 103 | Temp 98.4°F | Ht 65.0 in | Wt 244.3 lb

## 2011-12-26 DIAGNOSIS — C50911 Malignant neoplasm of unspecified site of right female breast: Secondary | ICD-10-CM

## 2011-12-26 DIAGNOSIS — C50919 Malignant neoplasm of unspecified site of unspecified female breast: Secondary | ICD-10-CM

## 2011-12-26 DIAGNOSIS — I890 Lymphedema, not elsewhere classified: Secondary | ICD-10-CM

## 2011-12-26 DIAGNOSIS — I10 Essential (primary) hypertension: Secondary | ICD-10-CM

## 2011-12-26 LAB — CANCER ANTIGEN 27.29: CA 27.29: 49 U/mL — ABNORMAL HIGH (ref 0–39)

## 2011-12-26 LAB — CBC WITH DIFFERENTIAL/PLATELET
BASO%: 0.5 % (ref 0.0–2.0)
HCT: 31.4 % — ABNORMAL LOW (ref 34.8–46.6)
LYMPH%: 23.3 % (ref 14.0–49.7)
MCH: 31.1 pg (ref 25.1–34.0)
MCHC: 32.5 g/dL (ref 31.5–36.0)
MONO#: 0.4 10*3/uL (ref 0.1–0.9)
NEUT%: 65.8 % (ref 38.4–76.8)
Platelets: 174 10*3/uL (ref 145–400)
WBC: 4 10*3/uL (ref 3.9–10.3)

## 2011-12-26 LAB — COMPREHENSIVE METABOLIC PANEL
AST: 16 U/L (ref 0–37)
BUN: 19 mg/dL (ref 6–23)
Calcium: 9.6 mg/dL (ref 8.4–10.5)
Chloride: 104 mEq/L (ref 96–112)
Creatinine, Ser: 0.99 mg/dL (ref 0.50–1.10)

## 2011-12-26 LAB — LACTATE DEHYDROGENASE: LDH: 146 U/L (ref 94–250)

## 2011-12-26 NOTE — Telephone Encounter (Signed)
gave patient appointment for 02-07-2012 sent michelle email about treatment for 02-07-2012 printed out calendar and gave to the patient

## 2011-12-26 NOTE — Progress Notes (Signed)
Hematology and Oncology Follow Up Visit  TERICKA DEVINCENZI [id number] October 18, 1945 66 y.o. 12/26/2011    HPI: 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 The Herceptin was held due to low ejection fraction. She underwent a right modified radical mastectomy with axillary node dissection on 12/28/2010 which revealed no evidence of residual infiltrating ductal carcinoma, but 20/21 lymph nodes were positive for metastatic disease with components of extracapsular extension. She completed radiation therapy to the right chest wall, right axilla and supraclavicular region under the care of Dr. Roselind Messier on 03/07/2011. After improvement of her left ventricular ejection fraction, Herceptin has been resumed, due for next every 3 week dose today. She continues on Femara 2.5 mg by mouth daily, due for her next every 3 week dose of Herceptin.  Interim History:   Ms. Swigert is seen today prior to her next every 3 week dose of Herceptin. She is feeling well, but notes that per cardiology, we should be holding her Herceptin today. She feels well, denying any unexplained fevers, chills, no shortness of breath, or chest pain. She denies nausea, emesis, diarrhea, or constipation issues. She has not appreciated any hot flashes, she denies any significant discomfort from underlying arthritis. She admits that she is not exercising on a regular basis. She does have some fatigability. She is due to see [name] on 02/02/2012, 2-D echocardiogram to be repeated prior.  A detailed review of systems is otherwise noncontributory as noted below.  Review of Systems: Constitutional:  no weight loss, fever, night sweats and feels well Eyes: uses glasses ENT: no  complaints Cardiovascular: no chest pain or dyspnea on exertion Respiratory: no cough, shortness of breath, or wheezing Neurological: no TIA or stroke symptoms Dermatological: negative Gastrointestinal: no abdominal pain, change in bowel habits, or black or bloody stools Genito-Urinary: no dysuria, trouble voiding, or hematuria Hematological and Lymphatic: negative Breast: negative Musculoskeletal: Pain in the right thumb along with stiffness. Remaining ROS negative.   Medications:   I have reviewed the patient's current medications.  Current Outpatient Prescriptions  Medication Sig Dispense Refill  . aspirin 81 MG tablet Take 81 mg by mouth daily.        . Calcium Carbonate-Vitamin D 600-400 MG-UNIT per tablet Take 1 tablet by mouth daily.        . carvedilol (COREG) 12.5 MG tablet Take 1 tablet (12.5 mg total) by mouth 2 (two) times daily with a meal.  60 tablet  6  . ferrous sulfate 325 (65 FE) MG tablet Take 325 mg by mouth daily with breakfast.        . gabapentin (NEURONTIN) 300 MG capsule Take 300 mg by mouth at bedtime.        Marland Kitchen letrozole (FEMARA) 2.5 MG tablet Take 1 tablet (2.5 mg total) by mouth daily.  30 tablet  4  . lisinopril (PRINIVIL,ZESTRIL) 40 MG tablet Take 1 tablet (40 mg total) by mouth daily.  30 tablet  6  . oxyCODONE-acetaminophen (PERCOCET) 5-325 MG per tablet Take 1 tablet by mouth every 6 (six) hours as needed. 1-2 po q6-8h prn pain-#90 12/05/11-CTS      . simvastatin (ZOCOR) 10 MG tablet Take 10 mg by mouth at bedtime.         No current facility-administered medications for this  visit.   Facility-Administered Medications Ordered in Other Visits  Medication Dose Route Frequency Provider Last Rate Last Dose  . lidocaine-prilocaine (EMLA) cream   Topical Once Pierce Crane, MD        Allergies: No Known Allergies  Physical Exam: Filed Vitals:   12/26/11 1028  BP: 189/94  Pulse: 103  Temp: 98.4 F (36.9 C)  Weight: 244 lbs. HEENT:  Sclerae anicteric,  conjunctivae pink.  Oropharynx clear.  No mucositis or candidiasis.   Nodes:  No cervical, supraclavicular, or axillary lymphadenopathy palpated.  Breast Exam: Deferred. Lungs:  Clear to auscultation bilaterally.  No crackles, rhonchi, or wheezes.   Heart:  Regular rate and rhythm.   Abdomen:  Soft, nontender.  Positive bowel sounds.  No organomegaly or masses palpated.   Musculoskeletal:  No focal spinal tenderness to palpation.  Extremities:  Benign, except there is evidence to suggest very mild right upper extremity lymphedema. No obvious deformity is noted of the right thumb though does have some decreased range of motion. No erythema.  Skin:  Benign.   Neuro:  Nonfocal, alert and oriented x 3.  Lab Results: Lab Results  Component Value Date   WBC 4.0 12/26/2011   HGB 10.2* 12/26/2011   HCT 31.4* 12/26/2011   MCV 95.6 12/26/2011   PLT 174 12/26/2011   NEUTROABS 2.6 12/26/2011     Chemistry      Component Value Date/Time   NA 139 10/24/2011 1058   K 4.2 10/24/2011 1058   CL 103 10/24/2011 1058   CO2 29 10/24/2011 1058   BUN 18 10/24/2011 1058   CREATININE 0.99 10/24/2011 1058      Component Value Date/Time   CALCIUM 9.4 10/24/2011 1058   ALKPHOS 67 10/24/2011 1058   AST 17 10/24/2011 1058   ALT 13 10/24/2011 1058   BILITOT 0.4 10/24/2011 1058     2D Echocardiogram: 12/21/11 Study Conclusions - Left ventricle: Poor image quality endocardium not well seen. Consdier MRI to further assess in future The cavity size was mildly dilated. Wall thickness was normal. Systolic function was normal. The estimated ejection fraction was in the range of 50% to 55%. - Mitral valve: Mild regurgitation. - Left atrium: The atrium was mildly dilated. - Atrial septum: No defect or patent foramen ovale was identified. - Pulmonary arteries: PA peak pressure: 32mm Hg (S). Transthoracic echocardiography. M-mode, complete 2D, spectral Doppler, and color Doppler. Height: Height: 165.1cm. Height: 65in.  Weight: Weight: 110.5kg. Weight: 243lb. Body mass index: BMI: 40.5kg/m^2. Body surface area: BSA: 2.26m^2. Blood pressure: 174/93. Patient status: Outpatient. Location: Echo laboratory. Prepared and Electronically Authenticated by Charlton Haws     Assessment:  Ms. 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 The Herceptin was held due to low ejection fraction. She underwent a right modified radical mastectomy with axillary node dissection on 12/28/2010 which revealed no evidence of residual infiltrating ductal carcinoma, but 20/21 lymph nodes were positive for metastatic disease with components of extracapsular extension. She completed radiation therapy to the right chest wall, right axilla and supraclavicular region under the care of Dr. Roselind Messier on 03/07/2011. After improvement of her left ventricular ejection fraction, Herceptin has been resumed, due for next every 3 week dose today. She continues on Femara 2.5 mg by mouth daily.  2. Mild  right upper extremity lymphedema.  3. Uncontrolled hypertension.  Case reviewed with Dr. Pierce Crane.    Plan:  As per Dr. Prescott Gum  request, we will hold Ms. Chasteen's Herceptin dosing until after her followup with cardiology including repeat echocardiogram on 02/02/2012.  She will continue on Femara 2.5 mg per day. We will plan on regrouping  on 02/07/2012 with a possible restart of Herceptin if cleared by cardiology.  This  plan was reviewed with the patient, who voices understanding and agreement.  She knows to call with any changes or problems.    Cledis Sohn T, PA-C 12/26/2011

## 2011-12-26 NOTE — Telephone Encounter (Signed)
Per email from [place], I have scheduled the treatment for the patient to follow MD visit on 6/11. Jacki Cones notified appt in computer.  JMW

## 2011-12-29 ENCOUNTER — Encounter (INDEPENDENT_AMBULATORY_CARE_PROVIDER_SITE_OTHER): Payer: Self-pay | Admitting: General Surgery

## 2011-12-29 ENCOUNTER — Ambulatory Visit (INDEPENDENT_AMBULATORY_CARE_PROVIDER_SITE_OTHER): Payer: Medicare Other | Admitting: General Surgery

## 2011-12-29 VITALS — BP 138/86 | HR 84 | Temp 97.6°F | Resp 20 | Ht 66.0 in | Wt 247.2 lb

## 2011-12-29 DIAGNOSIS — C50919 Malignant neoplasm of unspecified site of unspecified female breast: Secondary | ICD-10-CM

## 2011-12-29 NOTE — Progress Notes (Signed)
Subjective:     Patient ID: Jeanette Morris, female   DOB: [birth year], 66 y.o.   MRN: [id number]  HPI The patient is a 66 year old white female who is just over a year out from a right mastectomy and axillary node dissection for a right-sided Tx N3 breast cancer. She has no complaints today. She's had no medical issues since her last visit. She continues to take Femara and tolerates it well. Her last mammogram was in October and was negative. She currently is off any chemotherapy secondary to her blood pressure.She also notes some slight swelling of the right arm. She is seen physical therapy for this.  Review of Systems  Constitutional: Negative.   HENT: Negative.   Eyes: Negative.   Respiratory: Negative.   Cardiovascular: Negative.   Gastrointestinal: Negative.   Genitourinary: Negative.   Musculoskeletal: Positive for joint swelling.  Skin: Negative.   Neurological: Negative.   Hematological: Negative.   Psychiatric/Behavioral: Negative.        Objective:   Physical Exam  Constitutional: She is oriented to person, place, and time. She appears well-developed and well-nourished.  HENT:  Head: Normocephalic and atraumatic.  Eyes: Conjunctivae and EOM are normal. Pupils are equal, round, and reactive to light.  Neck: Normal range of motion. Neck supple.       Slight fatty prominence anteriorly on the lower neck  Cardiovascular: Normal rate, regular rhythm and normal heart sounds.   Pulmonary/Chest: Effort normal and breath sounds normal.       No palpable mass of the right chest wall. No palpable mass of the left breast. No axillary supraclavicular or cervical lymphadenopathy.  Abdominal: Soft. Bowel sounds are normal. She exhibits no mass. There is no tenderness.  Musculoskeletal: Normal range of motion.       Slight edema of the right arm  Lymphadenopathy:    She has no cervical adenopathy.  Neurological: She is alert and oriented to person, place, and time.  Skin: Skin is  warm and dry.  Psychiatric: She has a normal mood and affect. Her behavior is normal.       Assessment:     One year status post right mastectomy and axillary node dissection    Plan:     At this point I would like her to continue to do regular self exams. She will continue to take her for tomorrow. We will plan to see her back in 3 months

## 2011-12-29 NOTE — Patient Instructions (Signed)
Continue regular self exams Continue femara 

## 2012-01-02 ENCOUNTER — Encounter: Payer: Medicare Other | Admitting: Physical Therapy

## 2012-01-04 ENCOUNTER — Encounter: Payer: Medicare Other | Admitting: Physical Therapy

## 2012-01-07 NOTE — Progress Notes (Signed)
Patient ID: Jeanette Morris, female   DOB: 16-Dec-1945, 66 y.o.   MRN: [id number]  HPI:  Jeanette Morris is a 66 y/o woman with h/o HTN and obesity. Denies any known h/o CAD or HF.  She was diagnosed with R Breast CA in 9/11. Underwent neoadjuvant chemo with (TCH regimen) followed by carboplatin and Taxotere x 6 cycles. She is status post rt mastectomy in April 2012 LN+. ER/PR/NER 2-neu +. Post-op had XRT. [name] would like to start her on Herceptin. However had echocardiogram on 01/06/11 which was read as EF 45-50% and so referred for cardiology clearance.  Her echos have been reviewed, dating back to 2003. EF has remained in 45-50% range without significant change. Lateral S' velocity also unchanged around 10.5 cm/sec - although some echos with poor windows.  With her EF running on the low end of the normal range for almost a decade and the importance of the Herceptin therapy, I cleared her for Herceptin therapy.  Her first appt was scheduled for 9/7.  She has had a problem with HTN and there is a slightly increased risk for cardiotoxicity therefore we increased her lisinopril to 10 mg BID and changed her metoprolol to coreg 12.5 mg BID.  She was amlodipine but this was discontinued 04/2011 due to dizziness/HA.    Echos: 05/03/11: 55%, lateral s' velocity around 11.5 cm/sec.   09/22/11: 50-55%, lateral s' 10.5-11 but poor window 12/21/11: 45-50% LVIDd 5.0 cm (up from 4.4) Lat s' 6.7cm/sec (poor windows)  She returns for routine follow up today.  She was cleared to restart herceptin with close monitoring but she has failed to get in touch with [name] since her last visit. She has been back on Herceptin since January.  She is only taking lisinopril once a day. SBP recently 170.  Typically runs 140-150.  She feels well.  She denies edema, orthopnea, PND, SOB. syncope, dizziness or HA.  She walks up to the corner and turns around.   ROS: All other systems normal except as mentioned in HPI, past medical history  and problem list.    Past Medical History  Diagnosis Date  . Hypertension   . Hearing loss     right ear  . Hyperlipidemia   . Arthritis   . 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 Breast cancer     (Rt) breast ca dx 9/11    Current Outpatient Prescriptions  Medication Sig Dispense Refill  . aspirin 81 MG tablet Take 81 mg by mouth daily.        . Calcium Carbonate-Vitamin D 600-400 MG-UNIT per tablet Take 1 tablet by mouth daily.        . carvedilol (COREG) 12.5 MG tablet Take 1 tablet (12.5 mg total) by mouth 2 (two) times daily with a meal.  60 tablet  6  . ferrous sulfate 325 (65 FE) MG tablet Take 325 mg by mouth daily with breakfast.        . gabapentin (NEURONTIN) 300 MG capsule Take 300 mg by mouth at bedtime.        Marland Kitchen letrozole (FEMARA) 2.5 MG tablet Take 1 tablet (2.5 mg total) by mouth daily.  30 tablet  4  . lisinopril (PRINIVIL,ZESTRIL) 40 MG tablet Take 1 tablet (40 mg total) by mouth daily.  30 tablet  6  . simvastatin (ZOCOR) 10 MG tablet Take 10 mg by mouth at bedtime.         No current  facility-administered medications for this encounter.   Facility-Administered Medications Ordered in Other Encounters  Medication Dose Route Frequency Provider Last Rate Last Dose  . lidocaine-prilocaine (EMLA) cream   Topical Once Pierce Crane, MD         No Known Allergies   PHYSICAL EXAM: Filed Vitals:   12/22/11 0944  BP: 146/80  Pulse: 94  Weight: 248 lb (112.492 kg)  SpO2: 96%   General:  Obese. Well appearing. No respiratory difficulty HEENT: normal Neck: supple. no JVD. Carotids 2+ bilat; no bruits. No lymphadenopathy or thryomegaly appreciated. S/p R mastectomy Cor: PMI nondisplaced. Regular rate & rhythm. No rubs, gallops or murmurs. Lungs: clear Abdomen: obese soft, nontender, nondistended. No hepatosplenomegaly. No bruits or masses. Good bowel sounds. Extremities: no cyanosis, clubbing, rash, edema Neuro: alert & oriented x 3, cranial nerves  grossly intact. moves all 4 extremities w/o difficulty. Affect pleasant.    ASSESSMENT & PLAN:

## 2012-01-09 ENCOUNTER — Encounter: Payer: Medicare Other | Admitting: Physical Therapy

## 2012-01-10 ENCOUNTER — Ambulatory Visit (HOSPITAL_COMMUNITY)
Admission: RE | Admit: 2012-01-10 | Discharge: 2012-01-10 | Disposition: A | Discharge status: Home / Self Care | Payer: Medicare Other | Source: Ambulatory Visit | Attending: Internal Medicine | Admitting: Internal Medicine

## 2012-01-10 ENCOUNTER — Encounter (HOSPITAL_COMMUNITY): Payer: Self-pay

## 2012-01-10 VITALS — BP 118/62 | HR 89 | Wt 242.8 lb

## 2012-01-10 DIAGNOSIS — I428 Other cardiomyopathies: Secondary | ICD-10-CM | POA: Insufficient documentation

## 2012-01-10 DIAGNOSIS — I10 Essential (primary) hypertension: Secondary | ICD-10-CM | POA: Insufficient documentation

## 2012-01-10 DIAGNOSIS — I429 Cardiomyopathy, unspecified: Secondary | ICD-10-CM

## 2012-01-10 NOTE — Assessment & Plan Note (Signed)
SBP <130. BP better controlled on Lisinopril 40 mg daily. Continue current regimen.

## 2012-01-10 NOTE — Assessment & Plan Note (Signed)
Herceptin remains on hold. SBP better controlled with increased Lisinopril 40 mg daily. Reinforced daily weights and low salt diet/ food choices. She will follow in 3 weeks with an ECHO.

## 2012-01-10 NOTE — Progress Notes (Signed)
Patient ID: Jeanette Morris, female   DOB: 1946-04-03, 66 y.o.   MRN: [id number] HPI:  Jeanette Morris is a 66 y/o woman with h/o HTN and obesity. Denies any known h/o CAD or HF.  She was diagnosed with R Breast CA in 9/11. Underwent neoadjuvant chemo with (TCH regimen) followed by carboplatin and Taxotere x 6 cycles. She is status post rt mastectomy in April 2012 LN+. ER/PR/NER 2-neu +. Post-op had XRT. [name] would like to start her on Herceptin. However had echocardiogram on 01/06/11 which was read as EF 45-50% and so referred for cardiology clearance.  Her echos have been reviewed, dating back to 2003. EF has remained in 45-50% range without significant change. Lateral S' velocity also unchanged around 10.5 cm/sec - although some echos with poor windows.  With her EF running on the low end of the normal range for almost a decade and the importance of the Herceptin therapy, I cleared her for Herceptin therapy.  Her first appt was scheduled for 9/7.  She has had a problem with HTN and there is a slightly increased risk for cardiotoxicity therefore we increased her lisinopril to 10 mg BID and changed her metoprolol to coreg 12.5 mg BID.  She was amlodipine but this was discontinued 04/2011 due to dizziness/HA.    Echos: 05/03/11: 55%, lateral s' velocity around 11.5 cm/sec.   09/22/11: 50-55%, lateral s' 10.5-11 but poor window 12/21/11: 45-50% LVIDd 5.0 cm (up from 4.4) Lat s' 6.7cm/sec (poor windows)   She returns for routine follow up today.  Last visit Lisinopril was increased 40 mg day due to poor blood pressure control. She has been off Herceptin for the last month. Feels good. Denies PND/Orthopnea. SOB exertion .She is walking 3 days a week 30 minutes. Compliant with medications. Not following low salt diet.    ROS: All other systems normal except as mentioned in HPI, past medical history and problem list.    Past Medical History  Diagnosis Date  . Hypertension   . Hearing loss     right ear  .  Hyperlipidemia   . Arthritis   . 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 Breast cancer     (Rt) breast ca dx 9/11    Current Outpatient Prescriptions  Medication Sig Dispense Refill  . aspirin 81 MG tablet Take 81 mg by mouth daily.        . Calcium Carbonate-Vitamin D 600-400 MG-UNIT per tablet Take 1 tablet by mouth daily.        . carvedilol (COREG) 12.5 MG tablet Take 1 tablet (12.5 mg total) by mouth 2 (two) times daily with a meal.  60 tablet  6  . ferrous sulfate 325 (65 FE) MG tablet Take 325 mg by mouth daily with breakfast.        . gabapentin (NEURONTIN) 300 MG capsule Take 300 mg by mouth at bedtime.        Marland Kitchen letrozole (FEMARA) 2.5 MG tablet Take 1 tablet (2.5 mg total) by mouth daily.  30 tablet  4  . lisinopril (PRINIVIL,ZESTRIL) 40 MG tablet Take 1 tablet (40 mg total) by mouth daily.  30 tablet  6  . simvastatin (ZOCOR) 10 MG tablet Take 10 mg by mouth at bedtime.         No current facility-administered medications for this encounter.   Facility-Administered Medications Ordered in Other Encounters  Medication Dose Route Frequency Provider Last Rate Last Dose  . lidocaine-prilocaine (  EMLA) cream   Topical Once Pierce Crane, MD         No Known Allergies   PHYSICAL EXAM: Filed Vitals:   01/10/12 1008  BP: 118/62  Pulse: 89  Weight: 242 lb 12.8 oz (110.133 kg)  SpO2: 96%   General:  Obese. Well appearing. No respiratory difficulty HEENT: normal Neck: supple. no JVD. Carotids 2+ bilat; no bruits. No lymphadenopathy or thryomegaly appreciated. S/p R mastectomy Cor: PMI nondisplaced. Regular rate & rhythm. No rubs, gallops or murmurs. Lungs: clear Abdomen: obese soft, nontender, nondistended. No hepatosplenomegaly. No bruits or masses. Good bowel sounds. Extremities: no cyanosis, clubbing, rash, edema Neuro: alert & oriented x 3, cranial nerves grossly intact. moves all 4 extremities w/o difficulty. Affect pleasant.    ASSESSMENT & PLAN:

## 2012-01-10 NOTE — Patient Instructions (Signed)
Follow up in 3 weeks with an ECHO  Do the following things EVERYDAY: 1) Weigh yourself in the morning before breakfast. Write it down and keep it in a log. 2) Take your medicines as prescribed 3) Eat low salt foods-Limit salt (sodium) to 2000 mg per day.  4) Stay as active as you can everyday 5) Limit all fluids for the day to less than 2 liters 

## 2012-01-16 ENCOUNTER — Encounter: Payer: Medicare Other | Admitting: Physical Therapy

## 2012-01-31 ENCOUNTER — Other Ambulatory Visit: Payer: Self-pay | Admitting: *Deleted

## 2012-01-31 DIAGNOSIS — C50919 Malignant neoplasm of unspecified site of unspecified female breast: Secondary | ICD-10-CM

## 2012-01-31 MED ORDER — LETROZOLE 2.5 MG PO TABS: 2.5000 mg | ORAL_TABLET | Freq: Every day | ORAL | Status: DC

## 2012-02-02 ENCOUNTER — Ambulatory Visit (HOSPITAL_COMMUNITY)
Admission: RE | Admit: 2012-02-02 | Discharge: 2012-02-02 | Disposition: A | Discharge status: Home / Self Care | Payer: Medicaid Other | Source: Ambulatory Visit | Attending: Internal Medicine | Admitting: Internal Medicine

## 2012-02-02 ENCOUNTER — Ambulatory Visit (HOSPITAL_COMMUNITY)
Admission: RE | Admit: 2012-02-02 | Discharge: 2012-02-02 | Disposition: A | Discharge status: Home / Self Care | Payer: PRIVATE HEALTH INSURANCE | Source: Ambulatory Visit | Attending: Internal Medicine | Admitting: Internal Medicine

## 2012-02-02 ENCOUNTER — Encounter (HOSPITAL_COMMUNITY): Payer: Self-pay

## 2012-02-02 VITALS — BP 164/82 | HR 63 | Ht 65.0 in | Wt 248.0 lb

## 2012-02-02 DIAGNOSIS — E669 Obesity, unspecified: Secondary | ICD-10-CM | POA: Insufficient documentation

## 2012-02-02 DIAGNOSIS — T451X5A Adverse effect of antineoplastic and immunosuppressive drugs, initial encounter: Secondary | ICD-10-CM

## 2012-02-02 DIAGNOSIS — Z9010 Acquired absence of unspecified breast and nipple: Secondary | ICD-10-CM | POA: Insufficient documentation

## 2012-02-02 DIAGNOSIS — Z853 Personal history of malignant neoplasm of breast: Secondary | ICD-10-CM | POA: Insufficient documentation

## 2012-02-02 DIAGNOSIS — I10 Essential (primary) hypertension: Secondary | ICD-10-CM | POA: Insufficient documentation

## 2012-02-02 DIAGNOSIS — I517 Cardiomegaly: Secondary | ICD-10-CM | POA: Insufficient documentation

## 2012-02-02 DIAGNOSIS — Z7982 Long term (current) use of aspirin: Secondary | ICD-10-CM | POA: Insufficient documentation

## 2012-02-02 DIAGNOSIS — I428 Other cardiomyopathies: Secondary | ICD-10-CM | POA: Insufficient documentation

## 2012-02-02 DIAGNOSIS — C50919 Malignant neoplasm of unspecified site of unspecified female breast: Secondary | ICD-10-CM | POA: Insufficient documentation

## 2012-02-02 DIAGNOSIS — I429 Cardiomyopathy, unspecified: Secondary | ICD-10-CM

## 2012-02-02 DIAGNOSIS — Z85828 Personal history of other malignant neoplasm of skin: Secondary | ICD-10-CM | POA: Insufficient documentation

## 2012-02-02 DIAGNOSIS — Z9221 Personal history of antineoplastic chemotherapy: Secondary | ICD-10-CM | POA: Insufficient documentation

## 2012-02-02 DIAGNOSIS — I059 Rheumatic mitral valve disease, unspecified: Secondary | ICD-10-CM

## 2012-02-02 NOTE — Progress Notes (Signed)
*  PRELIMINARY RESULTS* Echocardiogram 2D Echocardiogram with Definity has been performed.  Glean Salen [facility] 02/02/2012, 1:48 PM

## 2012-02-02 NOTE — Patient Instructions (Signed)
Your physician has requested that you have a cardiac MRI. Cardiac MRI uses a computer to create images of your heart as its beating, producing both still and moving pictures of your heart and major blood vessels. For further information please visit [email or web address]. Please follow the instruction sheet given to you today for more information.  WE WILL CONTACT YOU TO SCHEDULE THIS AFTER WE APPROVE IT WITH YOUR INSURANCE

## 2012-02-02 NOTE — Progress Notes (Signed)
Pt had Definity study performed.  2cc of Definity/0.9% NaCl 1.3cc/8.7cc solution adm for study.  Pt tolerated procedure well, had no c/o pain or discomfort.

## 2012-02-03 ENCOUNTER — Other Ambulatory Visit (HOSPITAL_COMMUNITY): Payer: Self-pay | Admitting: Adult Health

## 2012-02-03 DIAGNOSIS — C50919 Malignant neoplasm of unspecified site of unspecified female breast: Secondary | ICD-10-CM

## 2012-02-03 MED FILL — Perflutren Lipid Microsphere IV Susp 6.52 MG/ML: INTRAVENOUS | Qty: 2 | Status: AC

## 2012-02-04 NOTE — Assessment & Plan Note (Signed)
Clinically she is doing very well with no evidence of HF. I have reviewed many of her echos at length including the one done today. EF remains at 45-50% where it has been for a long time (I think this is her baseline). However lateral s' is depressed and typically I would continue to hold Herceptin at this point. However, I went back and looked at echo images on many studies and found 1 or 2 studies several years ago when lateral s' was also around 7 cm/s - thus I am not sure if this is a true cardiomyopathy are artifactual. We have decided to get a cardiac MRI. I fthere is no fibrosis in the lateral wall then I would resume Herceptin with continued close echo surveillance. If there is fibrosis would hod Herceptin until lateral s' improving. D/x [name].

## 2012-02-04 NOTE — Progress Notes (Signed)
HPI:  Jeanette Morris is a 66 y/o woman with h/o HTN and obesity. Denies any known h/o CAD or HF.  She was diagnosed with R Breast CA in 9/11. Underwent neoadjuvant chemo with (TCH regimen) followed by carboplatin and Taxotere x 6 cycles. She is status post rt mastectomy in April 2012 LN+. ER/PR/NER 2-neu +. Post-op had XRT. [name] would like to start her on Herceptin. However had echocardiogram on 01/06/11 which was read as EF 45-50% and so referred for cardiology clearance.  Her echos have been reviewed, dating back to 2003. EF has remained in 45-50% range without significant change. Lateral S' velocity also unchanged around 10.5 cm/sec - although some echos with poor windows.  With her EF running on the low end of the normal range for almost a decade and the importance of the Herceptin therapy, I cleared her for Herceptin therapy.  Her first appt was scheduled for 9/7.  She has had a problem with HTN and there is a slightly increased risk for cardiotoxicity therefore we increased her lisinopril to 10 mg BID and changed her metoprolol to coreg 12.5 mg BID.  She was amlodipine but this was discontinued 04/2011 due to dizziness/HA.    Echos: 05/03/11: 55%, lateral s' velocity around 11.5 cm/sec.   09/22/11: 50-55%, lateral s' 10.5-11 but poor window 12/21/11: 45-50% LVIDd 5.0 cm (up from 4.4) Lat s' 6.7cm/sec (poor windows) 02/02/12: 45-50%   7.1  She returns today for follow up.  She feels pretty good.  Her BP is slightly up due to having get an IV placed for her echo.  Her Herceptin has been on hold since April.  She does not check her blood pressure at home.  She is trying to walk 2-3 times a week for 30 minutes.  She continues to have dyspnea with exertion but none at rest.  She denies PND/orthopnea/edema.  Compliant with meds.  No chest pain.  No dizziness/syncope.     ROS: All other systems normal except as mentioned in HPI, past medical history and problem list.    Past Medical History  Diagnosis Date    . Hypertension   . Hearing loss     right ear  . Hyperlipidemia   . Arthritis   . 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 Breast cancer     (Rt) breast ca dx 9/11    Current Outpatient Prescriptions  Medication Sig Dispense Refill  . aspirin 81 MG tablet Take 81 mg by mouth daily.        . Calcium Carbonate-Vitamin D 600-400 MG-UNIT per tablet Take 1 tablet by mouth daily.        . carvedilol (COREG) 12.5 MG tablet Take 1 tablet (12.5 mg total) by mouth 2 (two) times daily with a meal.  60 tablet  6  . ferrous sulfate 325 (65 FE) MG tablet Take 325 mg by mouth daily with breakfast.        . gabapentin (NEURONTIN) 300 MG capsule Take 300 mg by mouth at bedtime.        Marland Kitchen lisinopril (PRINIVIL,ZESTRIL) 40 MG tablet Take 1 tablet (40 mg total) by mouth daily.  30 tablet  6  . simvastatin (ZOCOR) 10 MG tablet Take 10 mg by mouth at bedtime.        Marland Kitchen letrozole (FEMARA) 2.5 MG tablet Take 1 tablet (2.5 mg total) by mouth daily.  30 tablet  5   No current facility-administered medications for this  encounter.   Facility-Administered Medications Ordered in Other Encounters  Medication Dose Route Frequency Provider Last Rate Last Dose  . lidocaine-prilocaine (EMLA) cream   Topical Once Pierce Crane, MD         No Known Allergies   PHYSICAL EXAM: Filed Vitals:   02/02/12 1133  BP: 164/82  Pulse: 63  Height: 5\' 5"  (1.651 m)  Weight: 248 lb (112.492 kg)   General:  Obese. Well appearing. No respiratory difficulty HEENT: normal Neck: supple. no JVD. Carotids 2+ bilat; no bruits. No lymphadenopathy or thryomegaly appreciated. S/p R mastectomy Cor: PMI nondisplaced. Regular rate & rhythm. No rubs, gallops or murmurs. Lungs: clear Abdomen: obese soft, nontender, nondistended. No hepatosplenomegaly. No bruits or masses. Good bowel sounds. Extremities: no cyanosis, clubbing, rash, edema Neuro: alert & oriented x 3, cranial nerves grossly intact. moves all 4 extremities w/o  difficulty. Affect pleasant.    ASSESSMENT & PLAN:

## 2012-02-07 ENCOUNTER — Ambulatory Visit (HOSPITAL_BASED_OUTPATIENT_CLINIC_OR_DEPARTMENT_OTHER): Payer: Medicaid Other | Admitting: Physician Assistant

## 2012-02-07 ENCOUNTER — Ambulatory Visit: Payer: Medicare Other

## 2012-02-07 ENCOUNTER — Other Ambulatory Visit (HOSPITAL_BASED_OUTPATIENT_CLINIC_OR_DEPARTMENT_OTHER): Payer: Medicare Other | Admitting: Lab

## 2012-02-07 ENCOUNTER — Telehealth: Payer: Self-pay | Admitting: *Deleted

## 2012-02-07 ENCOUNTER — Encounter: Payer: Self-pay | Admitting: Physician Assistant

## 2012-02-07 VITALS — BP 173/80 | HR 84 | Temp 98.3°F | Ht 65.0 in | Wt 245.3 lb

## 2012-02-07 DIAGNOSIS — C50919 Malignant neoplasm of unspecified site of unspecified female breast: Secondary | ICD-10-CM

## 2012-02-07 DIAGNOSIS — Z170 Estrogen receptor positive status [ER+]: Secondary | ICD-10-CM

## 2012-02-07 DIAGNOSIS — I10 Essential (primary) hypertension: Secondary | ICD-10-CM

## 2012-02-07 DIAGNOSIS — C50911 Malignant neoplasm of unspecified site of right female breast: Secondary | ICD-10-CM

## 2012-02-07 DIAGNOSIS — I890 Lymphedema, not elsewhere classified: Secondary | ICD-10-CM

## 2012-02-07 LAB — LACTATE DEHYDROGENASE: LDH: 155 U/L (ref 94–250)

## 2012-02-07 LAB — CBC WITH DIFFERENTIAL/PLATELET
Eosinophils Absolute: 0.1 10*3/uL (ref 0.0–0.5)
MONO#: 0.5 10*3/uL (ref 0.1–0.9)
NEUT#: 2.9 10*3/uL (ref 1.5–6.5)
Platelets: 175 10*3/uL (ref 145–400)
RBC: 3.53 10*6/uL — ABNORMAL LOW (ref 3.70–5.45)
RDW: 12.9 % (ref 11.2–14.5)
WBC: 4.8 10*3/uL (ref 3.9–10.3)
lymph#: 1.3 10*3/uL (ref 0.9–3.3)
nRBC: 0 % (ref 0–0)

## 2012-02-07 LAB — COMPREHENSIVE METABOLIC PANEL
ALT: 14 U/L (ref 0–35)
AST: 15 U/L (ref 0–37)
CO2: 25 mEq/L (ref 19–32)
Calcium: 9.3 mg/dL (ref 8.4–10.5)
Chloride: 106 mEq/L (ref 96–112)
Sodium: 140 mEq/L (ref 135–145)
Total Protein: 7.4 g/dL (ref 6.0–8.3)

## 2012-02-07 NOTE — Progress Notes (Signed)
Hematology and Oncology Follow Up Visit  Jeanette Morris [id number] [birth year] 66 y.o. 02/07/2012    HPI: 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 The Herceptin was held due to low ejection fraction. She underwent a right modified radical mastectomy with axillary node dissection on 12/28/2010 which revealed no evidence of residual infiltrating ductal carcinoma, but 20/21 lymph nodes were positive for metastatic disease with components of extracapsular extension. She completed radiation therapy to the right chest wall, right axilla and supraclavicular region under the care of Dr. Roselind Messier on 03/07/2011.  Herceptin on hold. She continues on Femara 2.5 mg by mouth daily.  Interim History:   [name] is seen today prior to possibly resuming every 3 week dose of Herceptin. She is feeling well, but notes that per cardiology, we will continue to hold her Herceptin. She feels well, denying any unexplained fevers, chills, no shortness of breath, or chest pain. She denies nausea, emesis, diarrhea, or constipation issues. She has not appreciated any hot flashes, she denies any significant discomfort from underlying arthritis. She admits that she is not exercising on a regular basis. She does have some fatigability. A detailed review of systems is otherwise noncontributory as noted below.  Review of Systems: Constitutional:  no weight loss, fever, night sweats and feels well Eyes: uses glasses ENT: no complaints Cardiovascular: no chest pain or dyspnea on exertion Respiratory: no cough, shortness of breath, or wheezing Neurological: no TIA or stroke symptoms Dermatological: negative Gastrointestinal: no abdominal pain, change in bowel habits, or black  or bloody stools Genito-Urinary: no dysuria, trouble voiding, or hematuria Hematological and Lymphatic: negative Breast: negative Musculoskeletal: Pain in the right thumb along with stiffness. Remaining ROS negative.   Medications:   I have reviewed the patient's current medications.  Current Outpatient Prescriptions  Medication Sig Dispense Refill  . aspirin 81 MG tablet Take 81 mg by mouth daily.        . Calcium Carbonate-Vitamin D 600-400 MG-UNIT per tablet Take 1 tablet by mouth daily.        . carvedilol (COREG) 12.5 MG tablet Take 1 tablet (12.5 mg total) by mouth 2 (two) times daily with a meal.  60 tablet  6  . ferrous sulfate 325 (65 FE) MG tablet Take 325 mg by mouth daily with breakfast.        . gabapentin (NEURONTIN) 300 MG capsule Take 300 mg by mouth at bedtime.        Marland Kitchen letrozole (FEMARA) 2.5 MG tablet Take 1 tablet (2.5 mg total) by mouth daily.  30 tablet  5  . lisinopril (PRINIVIL,ZESTRIL) 40 MG tablet Take 1 tablet (40 mg total) by mouth daily.  30 tablet  6  . simvastatin (ZOCOR) 10 MG tablet Take 10 mg by mouth at bedtime.         No current facility-administered medications for this visit.   Facility-Administered Medications Ordered in Other Visits  Medication Dose Route Frequency Provider Last Rate Last Dose  . lidocaine-prilocaine (EMLA) cream   Topical Once Pierce Crane, MD        Allergies: No Known Allergies  Physical Exam: Filed Vitals:   02/07/12 1042  BP: 173/80  Pulse: 84  Temp: 98.3 F (36.8 C)  Weight: 245 lbs. HEENT:  Sclerae anicteric,  conjunctivae pink.  Oropharynx clear.  No mucositis or candidiasis.   Nodes:  No cervical, supraclavicular, or axillary lymphadenopathy palpated.  Breast Exam: The right breast is surgically absent with a well-healed mastectomy scar, there is no evidence of any skin nodules or masses the right axilla is clear. The left breast was examined, it is free of masses, skin changes nipple inversion or discharge  axilla clear.,  Lungs:  Clear to auscultation bilaterally.  No crackles, rhonchi, or wheezes.   Heart:  Regular rate and rhythm.   Abdomen:  Soft, nontender.  Positive bowel sounds.  No organomegaly or masses palpated.   Musculoskeletal:  No focal spinal tenderness to palpation.  Extremities:  Benign. No erythema.  Skin:  Benign.   Neuro:  Nonfocal, alert and oriented x 3.  Lab Results: Lab Results  Component Value Date   WBC 4.8 02/07/2012   HGB 10.8* 02/07/2012   HCT 32.6* 02/07/2012   MCV 92.4 02/07/2012   PLT 175 02/07/2012   NEUTROABS 2.9 02/07/2012     Chemistry      Component Value Date/Time   NA 142 12/26/2011 1000   K 3.8 12/26/2011 1000   CL 104 12/26/2011 1000   CO2 30 12/26/2011 1000   BUN 19 12/26/2011 1000   CREATININE 0.99 12/26/2011 1000      Component Value Date/Time   CALCIUM 9.6 12/26/2011 1000   ALKPHOS 63 12/26/2011 1000   AST 16 12/26/2011 1000   ALT 14 12/26/2011 1000   BILITOT 0.4 12/26/2011 1000       Assessment:  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 The Herceptin was held due to low ejection fraction. She underwent a right modified radical mastectomy with axillary node dissection on 12/28/2010 which revealed no evidence of residual infiltrating ductal carcinoma, but 20/21 lymph nodes were positive for metastatic disease with components of extracapsular extension. She completed radiation therapy to the right chest wall, right axilla and supraclavicular region under the care of Dr. Roselind Messier on 03/07/2011. After improvement of her left ventricular ejection fraction, Herceptin has been resumed, due for next every 3 week dose, but on hold per Cardiology. She continues on Femara 2.5 mg by mouth daily.  2. Mild  right upper extremity lymphedema.  3. Uncontrolled hypertension.  Case reviewed with Dr. Pierce Crane.   Plan:  As per Dr. Prescott Gum  request, we will continue to hold Ms. Harnden's Herceptin dosing until after her followup with cardiology including a cardiac MRI.  She will continue on Femara 2.5 mg per day. We will plan on regrouping the week of 03/19/12 with a possible restart of Herceptin if cleared by cardiology.  This  plan was reviewed with the patient, who voices understanding and agreement.  She knows to call with any changes or problems.    Justyne Roell T, PA-C 02/07/2012

## 2012-02-07 NOTE — Telephone Encounter (Signed)
Per e mail from [place], I have schedudled treatment appt.  JMW

## 2012-02-07 NOTE — Telephone Encounter (Signed)
gave patient appointment for 03-19-2012 starting at 8:45am emailed michelle to add on treatment for 03-19-2012 cancelled 02-07-2012

## 2012-02-08 ENCOUNTER — Encounter: Payer: Self-pay | Admitting: Internal Medicine

## 2012-02-14 ENCOUNTER — Ambulatory Visit (HOSPITAL_COMMUNITY)
Admission: RE | Admit: 2012-02-14 | Discharge: 2012-02-14 | Disposition: A | Discharge status: Home / Self Care | Payer: Medicare Other | Source: Ambulatory Visit | Attending: Adult Health | Admitting: Adult Health

## 2012-02-14 DIAGNOSIS — I428 Other cardiomyopathies: Secondary | ICD-10-CM | POA: Insufficient documentation

## 2012-02-14 DIAGNOSIS — C50919 Malignant neoplasm of unspecified site of unspecified female breast: Secondary | ICD-10-CM | POA: Insufficient documentation

## 2012-02-14 DIAGNOSIS — I429 Cardiomyopathy, unspecified: Secondary | ICD-10-CM

## 2012-02-14 MED ORDER — GADOBENATE DIMEGLUMINE 529 MG/ML IV SOLN
35.0000 mL | Freq: Once | INTRAVENOUS | Status: AC
  Administered 2012-02-14: 35 mL via INTRAVENOUS

## 2012-02-16 ENCOUNTER — Encounter (HOSPITAL_COMMUNITY): Payer: Medicare Other

## 2012-03-08 ENCOUNTER — Ambulatory Visit (HOSPITAL_COMMUNITY)
Admission: RE | Admit: 2012-03-08 | Discharge: 2012-03-08 | Disposition: A | Discharge status: Home / Self Care | Payer: Medicare Other | Source: Ambulatory Visit | Attending: Internal Medicine | Admitting: Internal Medicine

## 2012-03-08 VITALS — BP 134/78 | HR 83 | Wt 244.0 lb

## 2012-03-08 DIAGNOSIS — H9190 Unspecified hearing loss, unspecified ear: Secondary | ICD-10-CM | POA: Insufficient documentation

## 2012-03-08 DIAGNOSIS — I429 Cardiomyopathy, unspecified: Secondary | ICD-10-CM

## 2012-03-08 DIAGNOSIS — M1990 Unspecified osteoarthritis, unspecified site: Secondary | ICD-10-CM | POA: Insufficient documentation

## 2012-03-08 DIAGNOSIS — I427 Cardiomyopathy due to drug and external agent: Secondary | ICD-10-CM

## 2012-03-08 DIAGNOSIS — C50919 Malignant neoplasm of unspecified site of unspecified female breast: Secondary | ICD-10-CM | POA: Insufficient documentation

## 2012-03-08 DIAGNOSIS — I10 Essential (primary) hypertension: Secondary | ICD-10-CM | POA: Insufficient documentation

## 2012-03-08 DIAGNOSIS — T451X5A Adverse effect of antineoplastic and immunosuppressive drugs, initial encounter: Secondary | ICD-10-CM

## 2012-03-08 DIAGNOSIS — E669 Obesity, unspecified: Secondary | ICD-10-CM | POA: Insufficient documentation

## 2012-03-08 DIAGNOSIS — Z85828 Personal history of other malignant neoplasm of skin: Secondary | ICD-10-CM | POA: Insufficient documentation

## 2012-03-08 DIAGNOSIS — Z9010 Acquired absence of unspecified breast and nipple: Secondary | ICD-10-CM | POA: Insufficient documentation

## 2012-03-08 DIAGNOSIS — Z7982 Long term (current) use of aspirin: Secondary | ICD-10-CM | POA: Insufficient documentation

## 2012-03-08 DIAGNOSIS — E785 Hyperlipidemia, unspecified: Secondary | ICD-10-CM | POA: Insufficient documentation

## 2012-03-08 MED ORDER — CARVEDILOL 12.5 MG PO TABS: 18.7500 mg | ORAL_TABLET | Freq: Two times a day (BID) | ORAL | Status: DC

## 2012-03-08 MED ORDER — SPIRONOLACTONE 25 MG PO TABS: 12.5000 mg | ORAL_TABLET | Freq: Every day | ORAL | Status: DC

## 2012-03-08 NOTE — Patient Instructions (Addendum)
Take Carvedilol 18.75 mg twice a day  (1 1/2 tabs am and pm)  Take Spironolactone 12.5 mg daily  Follow up in 3 weeks  Do the following things EVERYDAY: 1) Weigh yourself in the morning before breakfast. Write it down and keep it in a log. 2) Take your medicines as prescribed 3) Eat low salt foods-Limit salt (sodium) to 2000 mg per day.  4) Stay as active as you can everyday 5) Limit all fluids for the day to less than 2 liters

## 2012-03-08 NOTE — Assessment & Plan Note (Signed)
Discussed cardiac MRI results with Mr and Mrs Matto. EF reduced to 38%. Increase carvedilol 18.75 mg twice a day. Add spironolactone 12.5 mg daily. >50% of time spent on education.  Provided weight log and discussed daily weights, low salt food choices, and restricting fluids to less than 2 liters per day. Continue to hold Herceptin and repeat ECHO in 2 months. Agree with starting silver sneakers. CMET will be drawn 03/19/12. Follow up in 3 weeks.

## 2012-03-08 NOTE — Progress Notes (Signed)
Patient ID: Jeanette Morris, female   DOB: 11/07/1945, 66 y.o.   MRN: [id number] HPI:  Tonianne is a 66 y/o woman with h/o HTN and obesity. Denies any known h/o CAD or HF.  She was diagnosed with R Breast CA in 9/11. Underwent neoadjuvant chemo with (TCH regimen) followed by carboplatin and Taxotere x 6 cycles. She is status post rt mastectomy in April 2012 LN+. ER/PR/NER 2-neu +. Post-op had XRT. [name] would like to start her on Herceptin. However had echocardiogram on 01/06/11 which was read as EF 45-50% and so referred for cardiology clearance.  Her echos have been reviewed, dating back to 2003. EF has remained in 45-50% range without significant change. Lateral S' velocity also unchanged around 10.5 cm/sec - although some echos with poor windows.  With her EF running on the low end of the normal range for almost a decade and the importance of the Herceptin therapy, I cleared her for Herceptin therapy.  Her first appt was scheduled for 9/7.  She has had a problem with HTN and there is a slightly increased risk for cardiotoxicity therefore we increased her lisinopril to 10 mg BID and changed her metoprolol to coreg 12.5 mg BID.  She was amlodipine but this was discontinued 04/2011 due to dizziness/HA.    Echos: 05/03/11: 55%, lateral s' velocity around 11.5 cm/sec.   09/22/11: 50-55%, lateral s' 10.5-11 but poor window 12/21/11: 45-50% LVIDd 5.0 cm (up from 4.4) Lat s' 6.7cm/sec (poor windows) 02/02/12: 45-50%   7.1 02/23/12 Cardiac MRI: EF 38%  She returns today for follow up.  Denies SOB/PND/Orthopnea/CP. Complains of fatigue when walking up inclined surfaces. Currently she does not weigh at home. Plans to start sliver sneakers. Compliant with medications.      ROS: All other systems normal except as mentioned in HPI, past medical history and problem list.    Past Medical History  Diagnosis Date  . Hypertension   . Hearing loss     right ear  . Hyperlipidemia   . Arthritis   . 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 Breast cancer     (Rt) breast ca dx 9/11    Current Outpatient Prescriptions  Medication Sig Dispense Refill  . aspirin 81 MG tablet Take 81 mg by mouth daily.        . Calcium Carbonate-Vitamin D 600-400 MG-UNIT per tablet Take 1 tablet by mouth daily.        . carvedilol (COREG) 12.5 MG tablet Take 1 tablet (12.5 mg total) by mouth 2 (two) times daily with a meal.  60 tablet  6  . ferrous sulfate 325 (65 FE) MG tablet Take 325 mg by mouth daily with breakfast.        . gabapentin (NEURONTIN) 300 MG capsule Take 300 mg by mouth at bedtime.        Marland Kitchen letrozole (FEMARA) 2.5 MG tablet Take 1 tablet (2.5 mg total) by mouth daily.  30 tablet  5  . lisinopril (PRINIVIL,ZESTRIL) 40 MG tablet Take 1 tablet (40 mg total) by mouth daily.  30 tablet  6  . simvastatin (ZOCOR) 10 MG tablet Take 10 mg by mouth at bedtime.         No current facility-administered medications for this encounter.   Facility-Administered Medications Ordered in Other Encounters  Medication Dose Route Frequency Provider Last Rate Last Dose  . lidocaine-prilocaine (EMLA) cream   Topical Once Pierce Crane, MD  No Known Allergies   PHYSICAL EXAM: Filed Vitals:   03/08/12 1352  BP: 134/78  Pulse: 83  Weight: 244 lb (110.678 kg)  SpO2: 98%   General:  Obese. Well appearing. No respiratory difficulty (husband present) HEENT: normal Neck: supple. no JVD. Carotids 2+ bilat; no bruits. No lymphadenopathy or thryomegaly appreciated. S/p R mastectomy Cor: PMI nondisplaced. Regular rate & rhythm. No rubs, gallops or murmurs. Lungs: clear Abdomen: obese soft, nontender, nondistended. No hepatosplenomegaly. No bruits or masses. Good bowel sounds. Extremities: no cyanosis, clubbing, rash, trace edema Neuro: alert & oriented x 3, cranial nerves grossly intact. moves all 4 extremities w/o difficulty. Affect pleasant.    ASSESSMENT & PLAN:

## 2012-03-19 ENCOUNTER — Other Ambulatory Visit (HOSPITAL_BASED_OUTPATIENT_CLINIC_OR_DEPARTMENT_OTHER): Payer: Medicaid Other | Admitting: Lab

## 2012-03-19 ENCOUNTER — Encounter: Payer: Self-pay | Admitting: Physician Assistant

## 2012-03-19 ENCOUNTER — Ambulatory Visit: Payer: Medicare Other

## 2012-03-19 ENCOUNTER — Telehealth: Payer: Self-pay | Admitting: Oncology

## 2012-03-19 ENCOUNTER — Ambulatory Visit (HOSPITAL_BASED_OUTPATIENT_CLINIC_OR_DEPARTMENT_OTHER): Payer: Medicaid Other | Admitting: Physician Assistant

## 2012-03-19 VITALS — BP 144/77 | HR 56 | Temp 98.3°F | Ht 65.0 in | Wt 243.9 lb

## 2012-03-19 DIAGNOSIS — C50919 Malignant neoplasm of unspecified site of unspecified female breast: Secondary | ICD-10-CM

## 2012-03-19 LAB — CBC WITH DIFFERENTIAL/PLATELET
Basophils Absolute: 0 10*3/uL (ref 0.0–0.1)
EOS%: 1.5 % (ref 0.0–7.0)
HGB: 10.5 g/dL — ABNORMAL LOW (ref 11.6–15.9)
LYMPH%: 26.2 % (ref 14.0–49.7)
MCH: 31.2 pg (ref 25.1–34.0)
MCV: 94.6 fL (ref 79.5–101.0)
MONO%: 10.8 % (ref 0.0–14.0)
Platelets: 158 10*3/uL (ref 145–400)
RBC: 3.35 10*6/uL — ABNORMAL LOW (ref 3.70–5.45)
RDW: 13.1 % (ref 11.2–14.5)

## 2012-03-19 NOTE — Progress Notes (Signed)
Hematology and Oncology Follow Up Visit  Jeanette Morris [id number] 11-14-1945 66 y.o. 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 The Herceptin was held due to low ejection fraction. She underwent a right modified radical mastectomy with axillary node dissection on 12/28/2010 which revealed no evidence of residual infiltrating ductal carcinoma, but 20/21 lymph nodes were positive for metastatic disease with components of extracapsular extension. She completed radiation therapy to the right chest wall, right axilla and supraclavicular region under the care of Dr. Roselind Messier on 03/07/2011.  Herceptin on hold. She continues on Femara 2.5 mg by mouth daily.  Interim History:   Jeanette Morris is seen today prior to possibly resuming every 3 week dose of Herceptin. She is feeling well, but notes that per cardiology, we will continue to hold her Herceptin. She feels well, denying any unexplained fevers, chills, no shortness of breath, or chest pain. She denies nausea, emesis, diarrhea, or constipation issues. She has not appreciated any hot flashes, she denies any significant discomfort from underlying arthritis. She admits that she is not exercising on a regular basis. She does have some fatigability. A detailed review of systems is otherwise noncontributory as noted below.  Review of Systems: Constitutional:  no weight loss, fever, night sweats and feels well Eyes: uses glasses ENT: no complaints Cardiovascular: no chest pain or dyspnea on exertion Respiratory: no cough, shortness of breath, or wheezing Neurological: no TIA or stroke symptoms Dermatological: negative Gastrointestinal: no abdominal pain, change in bowel habits, or black  or bloody stools Genito-Urinary: no dysuria, trouble voiding, or hematuria Hematological and Lymphatic: negative Breast: negative Musculoskeletal: Pain in the right thumb along with stiffness. Remaining ROS negative.   Medications:   I have reviewed the patient's current medications.  Current Outpatient Prescriptions  Medication Sig Dispense Refill  . aspirin 81 MG tablet Take 81 mg by mouth daily.        . Calcium Carbonate-Vitamin D 600-400 MG-UNIT per tablet Take 1 tablet by mouth daily.        . carvedilol (COREG) 12.5 MG tablet Take 1.5 tablets (18.75 mg total) by mouth 2 (two) times daily with a meal.  90 tablet  6  . ferrous sulfate 325 (65 FE) MG tablet Take 325 mg by mouth daily with breakfast.        . gabapentin (NEURONTIN) 300 MG capsule Take 300 mg by mouth at bedtime.        Marland Kitchen letrozole (FEMARA) 2.5 MG tablet Take 1 tablet (2.5 mg total) by mouth daily.  30 tablet  5  . lisinopril (PRINIVIL,ZESTRIL) 40 MG tablet Take 1 tablet (40 mg total) by mouth daily.  30 tablet  6  . simvastatin (ZOCOR) 10 MG tablet Take 10 mg by mouth at bedtime.        Marland Kitchen spironolactone (ALDACTONE) 25 MG tablet Take 0.5 tablets (12.5 mg total) by mouth daily.  30 tablet  3   No current facility-administered medications for this visit.   Facility-Administered Medications Ordered in Other Visits  Medication Dose Route Frequency Provider Last Rate Last Dose  . lidocaine-prilocaine (EMLA) cream   Topical Once Pierce Crane, MD        Allergies: No Known Allergies  Physical Exam: Filed Vitals:   03/19/12  0930  BP: 144/77  Pulse: 56  Temp: 98.3 F (36.8 C)  Weight: 243 lbs. HEENT:  Sclerae anicteric, conjunctivae pink.  Oropharynx clear.  No mucositis or candidiasis.   Nodes:  No cervical, supraclavicular, or axillary lymphadenopathy palpated.  Breast Exam: Deferred.  Lungs:  Clear to auscultation bilaterally.  No crackles, rhonchi, or wheezes.   Heart:  Regular rate and rhythm.   Abdomen:   Soft, nontender.  Positive bowel sounds.  No organomegaly or masses palpated.   Musculoskeletal:  No focal spinal tenderness to palpation.  Extremities:  Benign. No erythema.  Skin:  Benign.   Neuro:  Nonfocal, alert and oriented x 3.  Lab Results: Lab Results  Component Value Date   WBC 3.4* 03/19/2012   HGB 10.5* 03/19/2012   HCT 31.7* 03/19/2012   MCV 94.6 03/19/2012   PLT 158 03/19/2012   NEUTROABS 2.1 03/19/2012     Chemistry      Component Value Date/Time   NA 140 02/07/2012 1027   K 4.2 02/07/2012 1027   CL 106 02/07/2012 1027   CO2 25 02/07/2012 1027   BUN 21 02/07/2012 1027   CREATININE 1.01 02/07/2012 1027      Component Value Date/Time   CALCIUM 9.3 02/07/2012 1027   ALKPHOS 63 02/07/2012 1027   AST 15 02/07/2012 1027   ALT 14 02/07/2012 1027   BILITOT 0.3 02/07/2012 1027       Assessment:  Jeanette Morris is a 66 year old [place] woman with a history of an 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 The Herceptin was held due to low ejection fraction. She underwent a right modified radical mastectomy with axillary node dissection on 12/28/2010 which revealed no evidence of residual infiltrating ductal carcinoma, but 20/21 lymph nodes were positive for metastatic disease with components of extracapsular extension. She completed radiation therapy to the right chest wall, right axilla and supraclavicular region under the care of Dr. Roselind Messier on 03/07/2011. After improvement of her left ventricular ejection fraction, Herceptin has been resumed, due for next every 3 week dose, but on hold per Cardiology. She continues on Femara 2.5 mg by mouth daily.  2. Mild right upper extremity lymphedema.  3. Uncontrolled hypertension, followed by Cardiology.  Case reviewed with Dr. Darnelle Catalan in Dr. Theron Arista Rubin's  absence.   Plan:  As per Dr. Prescott Gum  request, we will continue to hold Jeanette Morris's Herceptin dosing.  She will continue on Femara 2.5 mg per day. We will plan on regrouping the week of 04/30/12 with a possible restart of Herceptin if cleared by cardiology.  This  plan was reviewed with the patient, who voices understanding and agreement.  She knows to call with any changes or problems.    Fanchon Papania T, PA-C 03/19/2012

## 2012-03-19 NOTE — Telephone Encounter (Signed)
gve the pt her sept 2013 appt calendar °

## 2012-03-20 LAB — CANCER ANTIGEN 27.29: CA 27.29: 70 U/mL — ABNORMAL HIGH (ref 0–39)

## 2012-03-20 LAB — COMPREHENSIVE METABOLIC PANEL
AST: 14 U/L (ref 0–37)
Albumin: 3.8 g/dL (ref 3.5–5.2)
Alkaline Phosphatase: 63 U/L (ref 39–117)
BUN: 23 mg/dL (ref 6–23)
Glucose, Bld: 108 mg/dL — ABNORMAL HIGH (ref 70–99)
Potassium: 4.1 mEq/L (ref 3.5–5.3)
Total Bilirubin: 0.4 mg/dL (ref 0.3–1.2)

## 2012-03-23 ENCOUNTER — Ambulatory Visit (INDEPENDENT_AMBULATORY_CARE_PROVIDER_SITE_OTHER): Payer: Medicaid Other | Admitting: General Surgery

## 2012-03-23 ENCOUNTER — Encounter (INDEPENDENT_AMBULATORY_CARE_PROVIDER_SITE_OTHER): Payer: Self-pay | Admitting: General Surgery

## 2012-03-23 VITALS — BP 128/84 | HR 80 | Temp 97.6°F | Ht 65.0 in | Wt 240.6 lb

## 2012-03-23 DIAGNOSIS — C50919 Malignant neoplasm of unspecified site of unspecified female breast: Secondary | ICD-10-CM

## 2012-03-23 NOTE — Progress Notes (Signed)
Subjective:     Patient ID: Jeanette Morris, female   DOB: Aug 29, 1946, 66 y.o.   MRN: [id number]  HPI The patient is a 80 black female who is one year and 3 months out from a right modified radical mastectomy for a TXN3 right breast cancer. Since her last visit she has had no problems. She has no complaints today. She still has some soreness which is stable under the right arm pit area. She is still taking Femara and tolerating that well.  Review of Systems  Constitutional: Negative.   HENT: Negative.   Eyes: Negative.   Respiratory: Negative.   Cardiovascular: Negative.   Gastrointestinal: Negative.   Genitourinary: Negative.   Musculoskeletal: Negative.   Skin: Negative.   Neurological: Negative.   Hematological: Negative.   Psychiatric/Behavioral: Negative.        Objective:   Physical Exam  Constitutional: She is oriented to person, place, and time. She appears well-developed and well-nourished.  HENT:  Head: Normocephalic and atraumatic.  Eyes: Conjunctivae and EOM are normal. Pupils are equal, round, and reactive to light.  Neck: Neck supple.  Cardiovascular: Normal rate, regular rhythm and normal heart sounds.   Pulmonary/Chest: Effort normal and breath sounds normal.       She is tender along the lateral right chest wall. No palpable mass of the right chest wall. No palpable mass of the left breast. She does have a palpable lymph node in the left axilla. No palpable lymph nodes on the right axilla. No palpable supraclavicular cervical lymph nodes.  Abdominal: Soft. Bowel sounds are normal. She exhibits no mass. There is no tenderness.  Musculoskeletal: Normal range of motion.  Lymphadenopathy:    She has no cervical adenopathy.  Neurological: She is alert and oriented to person, place, and time.  Skin: Skin is warm and dry.  Psychiatric: She has a normal mood and affect. Her behavior is normal.       Assessment:     One year and 3 months status post right modified  radical mastectomy    Plan:     At this point she will continue to take tomorrow. She will continue to do regular self exams. We will plan to ultrasound the left axilla to look at the palpable lymph node. If this is negative will plan to see her back in about 3 months.

## 2012-03-23 NOTE — Patient Instructions (Signed)
Continue regular self exams. Continue femara. Will get ultrasound left axilla

## 2012-03-28 ENCOUNTER — Other Ambulatory Visit: Payer: Self-pay | Admitting: Oncology

## 2012-03-28 ENCOUNTER — Ambulatory Visit
Admission: RE | Admit: 2012-03-28 | Discharge: 2012-03-28 | Disposition: A | Discharge status: Home / Self Care | Payer: Medicaid Other | Source: Ambulatory Visit | Attending: General Surgery | Admitting: General Surgery

## 2012-03-28 DIAGNOSIS — C50919 Malignant neoplasm of unspecified site of unspecified female breast: Secondary | ICD-10-CM

## 2012-03-29 ENCOUNTER — Ambulatory Visit (HOSPITAL_COMMUNITY)
Admission: RE | Admit: 2012-03-29 | Discharge: 2012-03-29 | Disposition: A | Discharge status: Home / Self Care | Payer: Medicare Other | Source: Ambulatory Visit | Attending: Internal Medicine | Admitting: Internal Medicine

## 2012-03-29 VITALS — BP 140/80 | HR 63 | Resp 16 | Ht 65.0 in | Wt 245.5 lb

## 2012-03-29 DIAGNOSIS — I427 Cardiomyopathy due to drug and external agent: Secondary | ICD-10-CM

## 2012-03-29 DIAGNOSIS — T451X5A Adverse effect of antineoplastic and immunosuppressive drugs, initial encounter: Secondary | ICD-10-CM | POA: Insufficient documentation

## 2012-03-29 DIAGNOSIS — I10 Essential (primary) hypertension: Secondary | ICD-10-CM

## 2012-03-29 DIAGNOSIS — I429 Cardiomyopathy, unspecified: Secondary | ICD-10-CM

## 2012-03-29 LAB — BASIC METABOLIC PANEL
BUN: 18 mg/dL (ref 6–23)
Creatinine, Ser: 1 mg/dL (ref 0.50–1.10)
GFR calc Af Amer: 66 mL/min — ABNORMAL LOW (ref >90)
GFR calc non Af Amer: 57 mL/min — ABNORMAL LOW (ref >90)
Potassium: 4.2 mEq/L (ref 3.5–5.1)

## 2012-03-29 MED ORDER — CARVEDILOL 12.5 MG PO TABS: 25.0000 mg | ORAL_TABLET | Freq: Two times a day (BID) | ORAL | Status: DC

## 2012-03-29 NOTE — Progress Notes (Signed)
HPI:  Jeanette Morris is a 66 y/o woman with h/o HTN and obesity. Denies any known h/o CAD or HF.  She was diagnosed with R Breast CA in 9/11. Underwent neoadjuvant chemo with (TCH regimen) followed by carboplatin and Taxotere x 6 cycles. She is status post rt mastectomy in April 2012 LN+. ER/PR/NER 2-neu +. Post-op had XRT. [name] would like to start her on Herceptin. However had echocardiogram on 01/06/11 which was read as EF 45-50% and so referred for cardiology clearance.  Her echos have been reviewed, dating back to 2003. EF has remained in 45-50% range without significant change. Lateral S' velocity also unchanged around 10.5 cm/sec - although some echos with poor windows.  With her EF running on the low end of the normal range for almost a decade and the importance of the Herceptin therapy, I cleared her for Herceptin therapy.  Her first appt was scheduled for 9/7.  She has had a problem with HTN and there is a slightly increased risk for cardiotoxicity therefore we increased her lisinopril to 10 mg BID and changed her metoprolol to coreg 12.5 mg BID.  She was amlodipine but this was discontinued 04/2011 due to dizziness/HA.    Echos: 05/03/11: 55%, lateral s' velocity around 11.5 cm/sec.   09/22/11: 50-55%, lateral s' 10.5-11 but poor window 12/21/11: 45-50% LVIDd 5.0 cm (up from 4.4) Lat s' 6.7cm/sec (poor windows) 02/02/12: 45-50%   7.1 02/23/12 Cardiac MRI: EF 38%  She returns today for follow up.  She has been doing well.  Denies SOB/PND/Orthopnea/CP. No dizziness/syncope.  Weight at home 240-243 pounds.  [facility]  Compliant with medications.  Will see [name] the 26th of Sept.   ROS: All other systems normal except as mentioned in HPI, past medical history and problem list.    Past Medical History  Diagnosis Date  . Hypertension   . Hearing loss     right ear  . Hyperlipidemia   . Arthritis   . 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 Breast  cancer     (Rt) breast ca dx 9/11    Current Outpatient Prescriptions  Medication Sig Dispense Refill  . aspirin 81 MG tablet Take 81 mg by mouth daily.        . Calcium Carbonate-Vitamin D 600-400 MG-UNIT per tablet Take 1 tablet by mouth daily.        . carvedilol (COREG) 12.5 MG tablet Take 1.5 tablets (18.75 mg total) by mouth 2 (two) times daily with a meal.  90 tablet  6  . ferrous sulfate 325 (65 FE) MG tablet Take 325 mg by mouth daily with breakfast.        . gabapentin (NEURONTIN) 300 MG capsule Take 300 mg by mouth at bedtime.        Marland Kitchen letrozole (FEMARA) 2.5 MG tablet Take 1 tablet (2.5 mg total) by mouth daily.  30 tablet  5  . lisinopril (PRINIVIL,ZESTRIL) 40 MG tablet Take 1 tablet (40 mg total) by mouth daily.  30 tablet  6  . oxyCODONE-acetaminophen (PERCOCET/ROXICET) 5-325 MG per tablet       . simvastatin (ZOCOR) 10 MG tablet Take 10 mg by mouth at bedtime.        Marland Kitchen spironolactone (ALDACTONE) 25 MG tablet Take 0.5 tablets (12.5 mg total) by mouth daily.  30 tablet  3   No current facility-administered medications for this encounter.  Facility-Administered Medications Ordered in Other Encounters  Medication Dose Route Frequency Provider Last Rate Last Dose  . lidocaine-prilocaine (EMLA) cream   Topical Once Pierce Crane, MD         No Known Allergies   PHYSICAL EXAM: Filed Vitals:   03/29/12 1425  BP: 140/80  Pulse: 63  Resp: 16  Height: 5\' 5"  (1.651 m)  Weight: 245 lb 8 oz (111.358 kg)  SpO2: 99%   General:  Obese. Well appearing. No respiratory difficulty (husband present) HEENT: normal Neck: supple. no JVD. Carotids 2+ bilat; no bruits. No lymphadenopathy or thryomegaly appreciated. S/p R mastectomy Cor: PMI nondisplaced. Regular rate & rhythm. No rubs, gallops or murmurs. Lungs: clear Abdomen: obese soft, nontender, nondistended. No hepatosplenomegaly. No bruits or masses. Good bowel sounds. Extremities: no cyanosis, clubbing, rash, trace  edema Neuro: alert & oriented x 3, cranial nerves grossly intact. moves all 4 extremities w/o difficulty. Affect pleasant.    ASSESSMENT & PLAN:

## 2012-03-29 NOTE — Patient Instructions (Addendum)
Increase your Coreg to 2 tablets twice per day.  Your physician recommends that you schedule a follow-up appointment in: 3 weeks with the [facility].

## 2012-03-30 NOTE — Assessment & Plan Note (Addendum)
Jeanette Morris continues to do well despite LV dysfunction.  Have reviewed echos and discussed with [name], EF appears to be mildly depressed prior to initiation of herceptin.  With prior LV dysfunction and recent Cardiac MRI showing EF 38 % will continue to hold herceptin.  Titrate carvedilol 25 mg BID.  Will follow closely and need to discuss with [name] at follow up.  Will recheck echo in next 1-2 months for follow up since she will be on optimal medical therapy.  Discussed with the patient.

## 2012-04-03 ENCOUNTER — Encounter (HOSPITAL_COMMUNITY)
Admission: RE | Admit: 2012-04-03 | Discharge: 2012-04-03 | Disposition: A | Discharge status: Home / Self Care | Payer: Medicare Other | Source: Ambulatory Visit | Attending: Oncology | Admitting: Oncology

## 2012-04-03 DIAGNOSIS — K828 Other specified diseases of gallbladder: Secondary | ICD-10-CM | POA: Insufficient documentation

## 2012-04-03 DIAGNOSIS — C50919 Malignant neoplasm of unspecified site of unspecified female breast: Secondary | ICD-10-CM

## 2012-04-03 DIAGNOSIS — Z9221 Personal history of antineoplastic chemotherapy: Secondary | ICD-10-CM | POA: Insufficient documentation

## 2012-04-03 DIAGNOSIS — Z9010 Acquired absence of unspecified breast and nipple: Secondary | ICD-10-CM | POA: Insufficient documentation

## 2012-04-03 DIAGNOSIS — R599 Enlarged lymph nodes, unspecified: Secondary | ICD-10-CM | POA: Insufficient documentation

## 2012-04-03 DIAGNOSIS — E049 Nontoxic goiter, unspecified: Secondary | ICD-10-CM | POA: Insufficient documentation

## 2012-04-03 MED ORDER — FLUDEOXYGLUCOSE F - 18 (FDG) INJECTION
17.0000 | Freq: Once | INTRAVENOUS | Status: AC | PRN
  Administered 2012-04-03: 17 via INTRAVENOUS

## 2012-04-04 ENCOUNTER — Other Ambulatory Visit: Payer: Self-pay | Admitting: Oncology

## 2012-04-04 DIAGNOSIS — C50919 Malignant neoplasm of unspecified site of unspecified female breast: Secondary | ICD-10-CM

## 2012-04-04 NOTE — Assessment & Plan Note (Signed)
As above, increase carvedilol. 

## 2012-04-09 ENCOUNTER — Other Ambulatory Visit: Payer: Self-pay | Admitting: *Deleted

## 2012-04-09 DIAGNOSIS — C50919 Malignant neoplasm of unspecified site of unspecified female breast: Secondary | ICD-10-CM

## 2012-04-17 ENCOUNTER — Ambulatory Visit
Admission: RE | Admit: 2012-04-17 | Discharge: 2012-04-17 | Disposition: A | Discharge status: Home / Self Care | Payer: Medicare Other | Source: Ambulatory Visit | Attending: Oncology | Admitting: Oncology

## 2012-04-17 DIAGNOSIS — C50919 Malignant neoplasm of unspecified site of unspecified female breast: Secondary | ICD-10-CM

## 2012-04-18 ENCOUNTER — Telehealth: Payer: Self-pay | Admitting: *Deleted

## 2012-04-18 ENCOUNTER — Encounter: Payer: Self-pay | Admitting: *Deleted

## 2012-04-18 NOTE — Telephone Encounter (Signed)
patient confirmed over the phone the new date and time on 04-23-2012 at 2:00pm

## 2012-04-19 ENCOUNTER — Encounter (HOSPITAL_COMMUNITY): Payer: Medicare Other

## 2012-04-20 ENCOUNTER — Ambulatory Visit (HOSPITAL_COMMUNITY)
Admission: RE | Admit: 2012-04-20 | Discharge: 2012-04-20 | Disposition: A | Discharge status: Home / Self Care | Payer: Medicare Other | Source: Ambulatory Visit | Attending: Internal Medicine | Admitting: Internal Medicine

## 2012-04-20 ENCOUNTER — Encounter (HOSPITAL_COMMUNITY): Payer: Self-pay

## 2012-04-20 VITALS — BP 140/70 | HR 74 | Ht 65.0 in | Wt 242.0 lb

## 2012-04-20 DIAGNOSIS — T451X5A Adverse effect of antineoplastic and immunosuppressive drugs, initial encounter: Secondary | ICD-10-CM

## 2012-04-20 DIAGNOSIS — I428 Other cardiomyopathies: Secondary | ICD-10-CM

## 2012-04-20 DIAGNOSIS — I427 Cardiomyopathy due to drug and external agent: Secondary | ICD-10-CM

## 2012-04-20 DIAGNOSIS — I429 Cardiomyopathy, unspecified: Secondary | ICD-10-CM

## 2012-04-20 NOTE — Assessment & Plan Note (Signed)
I discussed the situation with her and her husband. It seems that her LV function has always been a little bit on the low end of normal. However over past 6 months it has drifted down some. With the lack of scarring on MRI. I am not convinced that this is due to Herceptin therapy. However, it may predispose her to a higher risk of progressive HF if we were to restart Herceptin. I told her, that if [name] felt Herceptin was an indispensable part of her chemotherapy regimen going forward we would be OK with her restarting Herceptin with very close f/u. However, if we could avoid Herceptin, I would obviously prefer that. We spoke to [name] today by phone and he said that he will likely plan to avoid Herceptin at this point in favor of another tyrosine kinase inhibitor which has a lower risk of cardiotoxicity. We will continue lisinopril and carvedilol as well as routine surveillance echos.

## 2012-04-20 NOTE — Progress Notes (Signed)
HPI:  Jeanette Morris is a 66 y/o woman with h/o HTN and obesity. Denies any known h/o CAD or HF.  She was diagnosed with R Breast CA in 9/11. Underwent neoadjuvant chemo with (TCH regimen) followed by carboplatin and Taxotere x 6 cycles. She is status post rt mastectomy in April 2012 LN+. ER/PR/NER 2-neu +. Post-op had XRT. [name] would like to start her on Herceptin. However had echocardiogram on 01/06/11 which was read as EF 45-50% and so referred for cardiology clearance.  Her echos have been reviewed, dating back to 2003. EF has remained in 40-50% range without significant change. Lateral S' velocity also unchanged around 10.5 cm/sec - although some echos with poor windows.  With her EF running on the low end of the normal range for almost a decade and the importance of the Herceptin therapy, we cleared her for Herceptin therapy starting in September 2012 with concomitant therapy with ACE-I and b-blocker. However, there was concern that EF was drifting down and Herceptin was stopped in January and she has not been on since.  Echos: 05/03/11: 50-55%, lateral s' velocity around 11.5 cm/sec.   09/22/11: 50-55%, lateral s' 10.5-11 but poor window 12/21/11: 45-50% LVIDd 5.0 cm (up from 4.4) Lat s' 6.7cm/sec (poor windows) 02/02/12: 40-45%%   7.1 02/23/12 Cardiac MRI: EF 38%. No scarring  She returns today for follow up. Unfortunately had biopsy on L axilla recently and found to have recurrent CA in he LNs. Scheduled to see [name] next week.  From a heart perspective she has been doing well.  Denies SOB/PND/Orthopnea/CP. No dizziness/syncope.  Weight at home 240-243 pounds. Compliant with medications.   ROS: All other systems normal except as mentioned in HPI, past medical history and problem list.    Past Medical History  Diagnosis Date  . Hypertension   . Hearing loss     right ear  . Hyperlipidemia   . Arthritis   . 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 Breast cancer     (Rt) breast ca  dx 9/11    Current Outpatient Prescriptions  Medication Sig Dispense Refill  . aspirin 81 MG tablet Take 81 mg by mouth daily.        . Calcium Carbonate-Vitamin D 600-400 MG-UNIT per tablet Take 1 tablet by mouth daily.        . carvedilol (COREG) 12.5 MG tablet Take 2 tablets (25 mg total) by mouth 2 (two) times daily with a meal.  90 tablet  6  . ferrous sulfate 325 (65 FE) MG tablet Take 325 mg by mouth daily with breakfast.        . gabapentin (NEURONTIN) 300 MG capsule Take 300 mg by mouth at bedtime.        Marland Kitchen letrozole (FEMARA) 2.5 MG tablet Take 1 tablet (2.5 mg total) by mouth daily.  30 tablet  5  . lisinopril (PRINIVIL,ZESTRIL) 40 MG tablet Take 1 tablet (40 mg total) by mouth daily.  30 tablet  6  . oxyCODONE-acetaminophen (PERCOCET/ROXICET) 5-325 MG per tablet       . simvastatin (ZOCOR) 10 MG tablet Take 10 mg by mouth at bedtime.        Marland Kitchen spironolactone (ALDACTONE) 25 MG tablet Take 0.5 tablets (12.5 mg total) by mouth daily.  30 tablet  3   No current facility-administered medications for this encounter.   Facility-Administered Medications Ordered in Other Encounters  Medication Dose Route Frequency Provider Last Rate Last Dose  .  lidocaine-prilocaine (EMLA) cream   Topical Once Pierce Crane, MD         No Known Allergies   PHYSICAL EXAM: Filed Vitals:   04/20/12 1058  BP: 140/70  Pulse: 74  Height: 5\' 5"  (1.651 m)  Weight: 242 lb (109.77 kg)  SpO2: 94%   General:  Obese. Well appearing. No respiratory difficulty (husband present) HEENT: normal Neck: supple. no JVD. Carotids 2+ bilat; no bruits. No lymphadenopathy or thryomegaly appreciated. S/p R mastectomy Cor: PMI nondisplaced. Regular rate & rhythm. No rubs, gallops or murmurs. Lungs: clear Abdomen: obese soft, nontender, nondistended. No hepatosplenomegaly. No bruits or masses. Good bowel sounds. Extremities: no cyanosis, clubbing, rash, trace edema Neuro: alert & oriented x 3, cranial nerves grossly  intact. moves all 4 extremities w/o difficulty. Affect pleasant.    ASSESSMENT & PLAN:

## 2012-04-20 NOTE — Patient Instructions (Signed)
Your physician has requested that you have an echocardiogram. Echocardiography is a painless test that uses sound waves to create images of your heart. It provides your doctor with information about the size and shape of your heart and how well your heart's chambers and valves are working. This procedure takes approximately one hour. There are no restrictions for this procedure.  Your physician recommends that you schedule a follow-up appointment in: one month

## 2012-04-20 NOTE — Addendum Note (Signed)
Encounter addended by: Theresia Bough, CMA on: 04/20/2012 12:09 PM<BR>     Documentation filed: Orders, Patient Instructions Section

## 2012-04-23 ENCOUNTER — Encounter: Payer: Self-pay | Admitting: Family

## 2012-04-23 ENCOUNTER — Ambulatory Visit (HOSPITAL_BASED_OUTPATIENT_CLINIC_OR_DEPARTMENT_OTHER): Payer: Medicaid Other | Admitting: Family

## 2012-04-23 VITALS — BP 121/66 | HR 73 | Temp 99.2°F | Resp 20 | Ht 65.0 in | Wt 240.3 lb

## 2012-04-23 DIAGNOSIS — Z170 Estrogen receptor positive status [ER+]: Secondary | ICD-10-CM

## 2012-04-23 DIAGNOSIS — C50919 Malignant neoplasm of unspecified site of unspecified female breast: Secondary | ICD-10-CM

## 2012-04-23 DIAGNOSIS — C773 Secondary and unspecified malignant neoplasm of axilla and upper limb lymph nodes: Secondary | ICD-10-CM

## 2012-04-23 DIAGNOSIS — Z9010 Acquired absence of unspecified breast and nipple: Secondary | ICD-10-CM

## 2012-04-23 MED ORDER — EXEMESTANE 25 MG PO TABS: 25.0000 mg | ORAL_TABLET | Freq: Every day | ORAL | Status: AC

## 2012-04-23 NOTE — Progress Notes (Signed)
Hematology and Oncology Follow Up Visit  MANVIR THORSON [id number] [birth year] 66 y.o. 04/23/2012    HPI: Ms. 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 The Herceptin was held due to low ejection fraction. She underwent a right modified radical mastectomy with axillary node dissection on 12/28/2010 which revealed no evidence of residual infiltrating ductal carcinoma, but 20/21 lymph nodes were positive for metastatic disease with components of extracapsular extension. She completed radiation therapy to the right chest wall, right axilla and supraclavicular region under the care of Dr. Roselind Messier on 03/07/2011.  Herceptin on hold. She continues on Femara 2.5 mg by mouth daily.  Interim History:   Ms. Kohler is seen today for recommendation following biopsy of left axillary node. Biopsy showed left axillary recurrence is HER-2/NEU negative. She is feeling well, but notes that per cardiology, we will continue to hold Herceptin. She feels well, denying any unexplained fevers, chills, no shortness of breath, or chest pain. She denies nausea, emesis, diarrhea, or constipation issues. No hot flashes, no discomfort from underlying arthritis. Reports early fatigability.  A detailed review of systems is otherwise noncontributory as noted below.  Medications:   I have reviewed the patient's current medications.  Allergies: No Known Allergies  Physical Exam: Filed Vitals:   04/23/12 1425  BP: 121/66  Pulse: 73  Temp: 99.2 F (37.3 C)  Resp: 20  Weight: 243 lbs. HEENT:  Sclerae anicteric, conjunctivae pink.  Oropharynx clear.  No mucositis or candidiasis.   Nodes:  No cervical, supraclavicular, or axillary lymphadenopathy palpated.  Lungs:  Clear to  auscultation bilaterally.  No crackles, rhonchi, or wheezes.   Heart:  Regular rate and rhythm.   Abdomen:  Soft, nontender.  Positive bowel sounds.  No organomegaly or masses palpated.   Musculoskeletal:  No focal spinal tenderness to palpation.  Extremities:  Benign. No erythema.  Skin:  Benign.   Neuro:  Nonfocal, alert and oriented x 3. BREAST EXAM: In the supine position, with the right arm over the head, the right breast is surgically absent. No reconstruction. No subcutaneous nodules, no redness of the skin. No right axillary adenopathy. With the left arm over the head, the left nipple is everted. No periareolar edema or nipple discharge. No mass in any quadrant or subareolar region. No redness of the skin. No left axillary adenopathy.    Lab Results: Lab Results  Component Value Date   WBC 3.4* 03/19/2012   HGB 10.5* 03/19/2012   HCT 31.7* 03/19/2012   MCV 94.6 03/19/2012   PLT 158 03/19/2012   NEUTROABS 2.1 03/19/2012     Chemistry      Component Value Date/Time   NA 140 03/29/2012 1507   K 4.2 03/29/2012 1507   CL 101 03/29/2012 1507   CO2 30 03/29/2012 1507   BUN 18 03/29/2012 1507   CREATININE 1.00 03/29/2012 1507      Component Value Date/Time   CALCIUM 9.7 03/29/2012 1507   ALKPHOS 63 03/19/2012 0839   AST 14 03/19/2012 0839   ALT 13 03/19/2012 0839   BILITOT 0.4 03/19/2012 0839       Assessment:  69. 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  2. Contralateral axillary recurrence, HER-2 negative.  3. Currently on Femara with good tolerance.  4. Cardiac toxicity associated with Herceptin.   Plan:  1. Stop Letrozole. 2. Start Aromasin now. Prescription is printed and given to her.  3. Affinitor will be mailed to her. She will start as soon as the drug arrives.   4. Return to see [name] in 3 weeks.  5.  Discontinue Herceptin.   Case reviewed with Dr. Pierce Crane.  Colman Cater, FNP-C 04/23/2012

## 2012-04-23 NOTE — Patient Instructions (Addendum)
Stop Letrozole. Start Aromasin now. Affinitor will be mailed to you. Start as soon as you get the drug in the mail. You will take Aromasin and Affinitor every day.  Return to see [name] in 3 weeks.

## 2012-04-27 ENCOUNTER — Telehealth: Payer: Self-pay | Admitting: *Deleted

## 2012-04-27 NOTE — Telephone Encounter (Signed)
Patient confirmed over the phone the new date and time 05-14-2012 starting at 9:30am

## 2012-05-14 ENCOUNTER — Telehealth: Payer: Self-pay | Admitting: *Deleted

## 2012-05-14 ENCOUNTER — Encounter: Payer: Self-pay | Admitting: *Deleted

## 2012-05-14 ENCOUNTER — Ambulatory Visit (HOSPITAL_BASED_OUTPATIENT_CLINIC_OR_DEPARTMENT_OTHER): Payer: Medicaid Other | Admitting: Oncology

## 2012-05-14 ENCOUNTER — Encounter: Payer: Self-pay | Admitting: Oncology

## 2012-05-14 ENCOUNTER — Other Ambulatory Visit (HOSPITAL_BASED_OUTPATIENT_CLINIC_OR_DEPARTMENT_OTHER): Payer: Medicare Other | Admitting: Lab

## 2012-05-14 VITALS — BP 138/69 | HR 78 | Temp 98.4°F | Resp 20 | Ht 65.0 in | Wt 241.1 lb

## 2012-05-14 DIAGNOSIS — I429 Cardiomyopathy, unspecified: Secondary | ICD-10-CM

## 2012-05-14 DIAGNOSIS — C50919 Malignant neoplasm of unspecified site of unspecified female breast: Secondary | ICD-10-CM

## 2012-05-14 LAB — COMPREHENSIVE METABOLIC PANEL (CC13)
ALT: 16 U/L (ref 0–55)
AST: 18 U/L (ref 5–34)
Creatinine: 1.3 mg/dL — ABNORMAL HIGH (ref 0.6–1.1)
Total Bilirubin: 0.4 mg/dL (ref 0.20–1.20)

## 2012-05-14 LAB — CANCER ANTIGEN 27.29: CA 27.29: 63 U/mL — ABNORMAL HIGH (ref 0–39)

## 2012-05-14 LAB — CBC WITH DIFFERENTIAL/PLATELET
EOS%: 1.2 % (ref 0.0–7.0)
MCH: 29.8 pg (ref 25.1–34.0)
MCV: 92.1 fL (ref 79.5–101.0)
MONO%: 13.2 % (ref 0.0–14.0)
NEUT#: 1.4 10*3/uL — ABNORMAL LOW (ref 1.5–6.5)
RBC: 3.05 10*6/uL — ABNORMAL LOW (ref 3.70–5.45)
RDW: 12.2 % (ref 11.2–14.5)
nRBC: 0 % (ref 0–0)

## 2012-05-14 MED ORDER — PROMETHAZINE HCL 25 MG PO TABS: 25.0000 mg | ORAL_TABLET | Freq: Four times a day (QID) | ORAL | Status: DC | PRN

## 2012-05-14 NOTE — Telephone Encounter (Signed)
Gave patient appointment for 12-12-2012 starting at 10:30am with labs

## 2012-05-14 NOTE — Patient Instructions (Addendum)
Please take prilosec over the counter, for indigestion.

## 2012-05-14 NOTE — Progress Notes (Signed)
RECEIVED A FAX FROM [facility] CONCERNING A PRIOR AUTHORIZATION FOR AFINITOR. THIS REQUEST WAS PLACED IN THE MANAGED CARE BIN. 

## 2012-05-14 NOTE — Progress Notes (Signed)
Hematology and Oncology Follow Up Visit  TASFIA HANSEN [id number] September 07, 1945 66 y.o. 05/14/2012    HPI: 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 The Herceptin was held due to low ejection fraction. She underwent a right modified radical mastectomy with axillary node dissection on 12/28/2010 which revealed no evidence of residual infiltrating ductal carcinoma, but 20/21 lymph nodes were positive for metastatic disease with components of extracapsular extension. She completed radiation therapy to the right chest wall, right axilla and supraclavicular region under the care of Dr. Roselind Messier on 03/07/2011.  Herceptin on hold.  More recently she was noted to have left-sided axillary adenopathy. A biopsy was performed which didn't fracture that she had ER/PR positive disease which is HER-2 negative. A staging PET scan showed and supraclavicular adenopathy as well as left axillary adenopathy and a small focus of the breast which could in fact also be described as a mass in the tail of the breast. Discussion was held the patient and she was switched from Femara to Aromasin and AFFINITOR was added at a dose of 2.5 mg daily. His dose is increased to 5 mg daily and she is finished to taking the last sample does on the 13th of this month. She is experiencing a bit of nausea and some reflux type symptoms. Otherwise she is doing well. She really has no other complaints. She sees a cardiologist sometime later this month with a 2-D echo and sees Dr. Carolynne Edouard next month.  Interim History:   As above she is tolerating the Aromasin and benefits were. Apart from some reflux type symptoms. She is due increase her dose to 7.5 mg a seemingly 1 with dose  escalated.  Medications:   I have reviewed the patient's current medications.  Allergies: No Known Allergies  Physical Exam: Filed Vitals:   05/14/12 0949  BP: 138/69  Pulse: 78  Temp: 98.4 F (36.9 C)  Resp: 20  Weight: 243 lbs. HEENT:  Sclerae anicteric, conjunctivae pink.  Oropharynx clear.  No mucositis or candidiasis.   Nodes:  No cervical, supraclavicular, or axillary lymphadenopathy palpated.  Lungs:  Clear to auscultation bilaterally.  No crackles, rhonchi, or wheezes.   Heart:  Regular rate and rhythm.   Abdomen:  Soft, nontender.  Positive bowel sounds.  No organomegaly or masses palpated.   Musculoskeletal:  No focal spinal tenderness to palpation.  Extremities:  Benign. No erythema.  Skin:  Benign.   Neuro:  Nonfocal, alert and oriented x 3. BREAST EXAM: In the supine position, with the right arm over the head, the right breast is surgically absent. No reconstruction. No subcutaneous nodules, no redness of the skin. No right axillary adenopathy. With the left arm over the head, the left nipple is everted. No periareolar edema or nipple discharge. No mass in any quadrant or subareolar region. No redness of the skin. She has a palpable lymph node appreciated in the upper portion of the axilla. This is a feels about 2 cm the exact dimensions are difficult to assess. The breast itself is clear of any obvious masses. In the left supraclavicular fossa that appreciate any adenopathy.  Lab Results: Lab Results  Component Value Date   WBC 2.6* 05/14/2012   HGB 9.1* 05/14/2012   HCT 28.1* 05/14/2012   MCV 92.1  05/14/2012   PLT 114* 05/14/2012   NEUTROABS 1.4* 05/14/2012     Chemistry      Component Value Date/Time   NA 140 03/29/2012 1507   K 4.2 03/29/2012 1507   CL 101 03/29/2012 1507   CO2 30 03/29/2012 1507   BUN 18 03/29/2012 1507   CREATININE 1.00 03/29/2012 1507      Component Value Date/Time   CALCIUM 9.7 03/29/2012 1507   ALKPHOS 63 03/19/2012 0839   AST 14 03/19/2012 0839    ALT 13 03/19/2012 0839   BILITOT 0.4 03/19/2012 0839       Assessment:  62. 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  2. Contralateral axillary recurrence, HER-2 negative.  3. Currently on our Aromasin and affinitor 4. Cardiac toxicity associated with Herceptin.   Plan:  Patient currently is on 5 mg of affinitor. Of note that her cancer and the lower the hemoglobin of 9.1 platelet count 114. White count is 2.6 with an ANC of 1.4. At this point I may elect to keep her on the same dose of affinitor given her counts. I will likely see her as scheduled in about 4 weeks. I will defer to Dr. Carolynne Edouard as well as there may be an indication there for she can respond to at some point to consider her for surgery on the left side. One could see at all that this disease in the left side as manifestation of a new primary cancers especially given that it's now HER-2 negative.  Pierce Crane, M.D. 05/14/2012

## 2012-05-14 NOTE — Telephone Encounter (Signed)
Correction gave patient appointment for 06-28-2012

## 2012-05-14 NOTE — Progress Notes (Signed)
[facility], [id number], approved afinitor 5mg  from 05/14/12-05/14/13.

## 2012-05-21 ENCOUNTER — Other Ambulatory Visit (HOSPITAL_COMMUNITY): Payer: Medicare Other

## 2012-05-24 ENCOUNTER — Ambulatory Visit: Payer: Commercial Managed Care - PPO | Admitting: Oncology

## 2012-05-24 ENCOUNTER — Other Ambulatory Visit: Payer: Commercial Managed Care - PPO | Admitting: Lab

## 2012-05-28 ENCOUNTER — Ambulatory Visit (HOSPITAL_BASED_OUTPATIENT_CLINIC_OR_DEPARTMENT_OTHER)
Admission: RE | Admit: 2012-05-28 | Discharge: 2012-05-28 | Disposition: A | Discharge status: Home / Self Care | Payer: Medicare Other | Source: Ambulatory Visit | Attending: Internal Medicine | Admitting: Internal Medicine

## 2012-05-28 ENCOUNTER — Ambulatory Visit: Payer: Self-pay | Admitting: Oncology

## 2012-05-28 ENCOUNTER — Other Ambulatory Visit: Payer: Self-pay | Admitting: Lab

## 2012-05-28 ENCOUNTER — Ambulatory Visit (HOSPITAL_COMMUNITY)
Admission: RE | Admit: 2012-05-28 | Discharge: 2012-05-28 | Disposition: A | Discharge status: Home / Self Care | Payer: Medicare Other | Source: Ambulatory Visit | Attending: Internal Medicine | Admitting: Internal Medicine

## 2012-05-28 VITALS — BP 150/78 | HR 90 | Wt 241.4 lb

## 2012-05-28 DIAGNOSIS — I379 Nonrheumatic pulmonary valve disorder, unspecified: Secondary | ICD-10-CM | POA: Insufficient documentation

## 2012-05-28 DIAGNOSIS — T451X5A Adverse effect of antineoplastic and immunosuppressive drugs, initial encounter: Secondary | ICD-10-CM

## 2012-05-28 DIAGNOSIS — I427 Cardiomyopathy due to drug and external agent: Secondary | ICD-10-CM

## 2012-05-28 DIAGNOSIS — I369 Nonrheumatic tricuspid valve disorder, unspecified: Secondary | ICD-10-CM | POA: Insufficient documentation

## 2012-05-28 DIAGNOSIS — I429 Cardiomyopathy, unspecified: Secondary | ICD-10-CM

## 2012-05-28 DIAGNOSIS — I428 Other cardiomyopathies: Secondary | ICD-10-CM | POA: Insufficient documentation

## 2012-05-28 MED ORDER — CARVEDILOL 12.5 MG PO TABS: 25.0000 mg | ORAL_TABLET | Freq: Two times a day (BID) | ORAL | Status: DC

## 2012-05-28 MED ORDER — CARVEDILOL 6.25 MG PO TABS: 6.2500 mg | ORAL_TABLET | Freq: Two times a day (BID) | ORAL | Status: DC

## 2012-05-28 NOTE — Patient Instructions (Addendum)
Follow up in 2 months with Dr Gala Romney. With an ECHO  Take Carvedilol 12.5 mg twice a day and 6.25 mg twice a day   Do the following things EVERYDAY: 1) Weigh yourself in the morning before breakfast. Write it down and keep it in a log. 2) Take your medicines as prescribed 3) Eat low salt foods-Limit salt (sodium) to 2000 mg per day.  4) Stay as active as you can everyday 5) Limit all fluids for the day to less than 2 liters

## 2012-05-28 NOTE — Assessment & Plan Note (Addendum)
Reviewed ECHOS. EF improving from 38%  up to 45%. Titrate up carvedilol 18.75 mg twice a day . Continue to hold Herceptin. Follow up in 2 months with and ECHO. If EF continues to improve may be able to consider resuming Hercpetin.   Patient seen and examined with Tonye Becket, NP. We discussed all aspects of the encounter. I agree with the assessment and plan as stated above. I have reviewed her echo images personally. Poor windows but EF probably 45-50% range. Lateral s' improving. No clinical HF. Will continue to hold Herceptin for now. Titrate b-blocker to 18.75 bid.

## 2012-05-28 NOTE — Progress Notes (Signed)
  Echocardiogram 2D Echocardiogram has been performed.  Jeanette Morris 05/28/2012, 9:17 AM

## 2012-05-28 NOTE — Progress Notes (Signed)
Patient ID: Jeanette Morris, female   DOB: 1946/06/12, 66 y.o.   MRN: [id number] HPI:  Jeanette Morris is a 67 y/o woman with h/o HTN and obesity. Denies any known h/o CAD or HF.  She was diagnosed with R Breast CA in 9/11. Underwent neoadjuvant chemo with (TCH regimen) followed by carboplatin and Taxotere x 6 cycles. She is status post rt mastectomy in April 2012 LN+. ER/PR/NER 2-neu +. Post-op had XRT. [name] would like to start her on Herceptin. However had echocardiogram on 01/06/11 which was read as EF 45-50% and so referred for cardiology clearance.  Her echos have been reviewed, dating back to 2003. EF has remained in 40-50% range without significant change. Lateral S' velocity also unchanged around 10.5 cm/sec - although some echos with poor windows.  With her EF running on the low end of the normal range for almost a decade and the importance of the Herceptin therapy, we cleared her for Herceptin therapy starting in September 2012 with concomitant therapy with ACE-I and b-blocker. However, there was concern that EF was drifting down and Herceptin was stopped in January and she has not been on since.  Echos: 05/03/11: 50-55%, lateral s' velocity around 11.5 cm/sec.   09/22/11: 50-55%, lateral s' 10.5-11 but poor window 12/21/11: 45-50% LVIDd 5.0 cm (up from 4.4) Lat s' 6.7cm/sec (poor windows) 02/02/12: 40-45%%   7.1 02/23/12 Cardiac MRI: EF 38%. No scarring 05/28/12 EF 45% lateral S' 10.1  She returns today for follow up. Currently on our Aromasin and Affinitor and Herceptin stopped in August due to cardiotoxicity.  Has occasional nausea. Denies SOB/PND/Orthopnea. Weight at home 240-243. She has  Not been walking due to nausea.  Complaint with medications.     .    ROS: All other systems normal except as mentioned in HPI, past medical history and problem list.    Past Medical History  Diagnosis Date  . Hypertension   . Hearing loss     right ear  . Hyperlipidemia   . Arthritis   . 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 Breast cancer     (Rt) breast ca dx 9/11    Current Outpatient Prescriptions  Medication Sig Dispense Refill  . aspirin 81 MG tablet Take 81 mg by mouth daily.        . Calcium Carbonate-Vitamin D 600-400 MG-UNIT per tablet Take 1 tablet by mouth daily.        . carvedilol (COREG) 12.5 MG tablet Take 2 tablets (25 mg total) by mouth 2 (two) times daily with a meal.  90 tablet  6  . everolimus (AFINITOR) 5 MG tablet Take 5 mg by mouth daily.      . ferrous sulfate 325 (65 FE) MG tablet Take 325 mg by mouth daily with breakfast.        . gabapentin (NEURONTIN) 300 MG capsule Take 300 mg by mouth at bedtime.        Marland Kitchen lisinopril (PRINIVIL,ZESTRIL) 40 MG tablet Take 1 tablet (40 mg total) by mouth daily.  30 tablet  6  . oxyCODONE-acetaminophen (PERCOCET/ROXICET) 5-325 MG per tablet       . promethazine (PHENERGAN) 25 MG tablet Take 1 tablet (25 mg total) by mouth every 6 (six) hours as needed for nausea.  30 tablet  4  . simvastatin (ZOCOR) 10 MG tablet Take 10 mg by mouth at bedtime.        Marland Kitchen spironolactone (ALDACTONE) 25  MG tablet Take 0.5 tablets (12.5 mg total) by mouth daily.  30 tablet  3   No current facility-administered medications for this encounter.   Facility-Administered Medications Ordered in Other Encounters  Medication Dose Route Frequency Provider Last Rate Last Dose  . lidocaine-prilocaine (EMLA) cream   Topical Once Pierce Crane, MD         No Known Allergies   PHYSICAL EXAM: Filed Vitals:   05/28/12 0930  BP: 150/78  Pulse: 90  Weight: 241 lb 6.4 oz (109.498 kg)  SpO2: 97%   General:  Obese. Well appearing. No respiratory difficulty (husband present) HEENT: normal Neck: supple. no JVD. Carotids 2+ bilat; no bruits. No lymphadenopathy or thryomegaly appreciated. S/p R mastectomy Cor: PMI nondisplaced. Regular rate & rhythm. No rubs, gallops or murmurs. Lungs: clear Abdomen: obese soft, nontender, nondistended. No  hepatosplenomegaly. No bruits or masses. Good bowel sounds. Extremities: no cyanosis, clubbing, rash, edema Neuro: alert & oriented x 3, cranial nerves grossly intact. moves all 4 extremities w/o difficulty. Affect pleasant.    ASSESSMENT & PLAN:

## 2012-06-12 ENCOUNTER — Encounter (INDEPENDENT_AMBULATORY_CARE_PROVIDER_SITE_OTHER): Payer: Medicare Other | Admitting: General Surgery

## 2012-06-15 ENCOUNTER — Encounter (INDEPENDENT_AMBULATORY_CARE_PROVIDER_SITE_OTHER): Payer: Self-pay | Admitting: General Surgery

## 2012-06-15 ENCOUNTER — Ambulatory Visit (INDEPENDENT_AMBULATORY_CARE_PROVIDER_SITE_OTHER): Payer: PRIVATE HEALTH INSURANCE | Admitting: General Surgery

## 2012-06-15 VITALS — BP 127/84 | HR 81 | Temp 98.1°F | Resp 18 | Ht 65.0 in | Wt 244.2 lb

## 2012-06-15 DIAGNOSIS — C50919 Malignant neoplasm of unspecified site of unspecified female breast: Secondary | ICD-10-CM

## 2012-06-15 DIAGNOSIS — C50912 Malignant neoplasm of unspecified site of left female breast: Secondary | ICD-10-CM | POA: Insufficient documentation

## 2012-06-15 NOTE — Patient Instructions (Signed)
Continue breast cancer treatment per [name]

## 2012-06-28 ENCOUNTER — Telehealth: Payer: Self-pay | Admitting: *Deleted

## 2012-06-28 ENCOUNTER — Other Ambulatory Visit (HOSPITAL_BASED_OUTPATIENT_CLINIC_OR_DEPARTMENT_OTHER): Payer: PRIVATE HEALTH INSURANCE | Admitting: Lab

## 2012-06-28 ENCOUNTER — Ambulatory Visit (HOSPITAL_BASED_OUTPATIENT_CLINIC_OR_DEPARTMENT_OTHER): Payer: Medicaid Other | Admitting: Oncology

## 2012-06-28 VITALS — BP 154/85 | HR 81 | Temp 97.8°F | Resp 20 | Ht 65.0 in | Wt 244.3 lb

## 2012-06-28 DIAGNOSIS — C50919 Malignant neoplasm of unspecified site of unspecified female breast: Secondary | ICD-10-CM

## 2012-06-28 DIAGNOSIS — Z170 Estrogen receptor positive status [ER+]: Secondary | ICD-10-CM

## 2012-06-28 DIAGNOSIS — D649 Anemia, unspecified: Secondary | ICD-10-CM

## 2012-06-28 DIAGNOSIS — Z853 Personal history of malignant neoplasm of breast: Secondary | ICD-10-CM

## 2012-06-28 DIAGNOSIS — E559 Vitamin D deficiency, unspecified: Secondary | ICD-10-CM

## 2012-06-28 DIAGNOSIS — D72819 Decreased white blood cell count, unspecified: Secondary | ICD-10-CM

## 2012-06-28 LAB — CBC WITH DIFFERENTIAL/PLATELET
Basophils Absolute: 0 10*3/uL (ref 0.0–0.1)
EOS%: 1.2 % (ref 0.0–7.0)
HCT: 26.2 % — ABNORMAL LOW (ref 34.8–46.6)
HGB: 8.8 g/dL — ABNORMAL LOW (ref 11.6–15.9)
MCH: 30.1 pg (ref 25.1–34.0)
MCV: 89.8 fL (ref 79.5–101.0)
NEUT%: 62.2 % (ref 38.4–76.8)
lymph#: 0.8 10*3/uL — ABNORMAL LOW (ref 0.9–3.3)

## 2012-06-28 LAB — COMPREHENSIVE METABOLIC PANEL (CC13)
Albumin: 3.4 g/dL — ABNORMAL LOW (ref 3.5–5.0)
CO2: 29 mEq/L (ref 22–29)
Calcium: 9.5 mg/dL (ref 8.4–10.4)
Chloride: 106 mEq/L (ref 98–107)
Glucose: 97 mg/dl (ref 70–99)
Sodium: 141 mEq/L (ref 136–145)
Total Bilirubin: 0.37 mg/dL (ref 0.20–1.20)
Total Protein: 7.4 g/dL (ref 6.4–8.3)

## 2012-06-28 NOTE — Telephone Encounter (Signed)
Gave patient appointment for 08-31-2012 starting at 10:00am

## 2012-06-28 NOTE — Progress Notes (Signed)
Hematology and Oncology Follow Up Visit  Jeanette Morris [id number] [birth year] 66 y.o. 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 The Herceptin was held due to low ejection fraction. She underwent a right modified radical mastectomy with axillary node dissection on 12/28/2010 which revealed no evidence of residual infiltrating ductal carcinoma, but 20/21 lymph nodes were positive for metastatic disease with components of extracapsular extension. She completed radiation therapy to the right chest wall, right axilla and supraclavicular region under the care of Dr. Roselind Messier on 03/07/2011.  Herceptin on hold.  More recently she was noted to have left-sided axillary adenopathy. A biopsy was performed which didn't fracture that she had ER/PR positive disease which is HER-2 negative. A staging PET scan showed and supraclavicular adenopathy as well as left axillary adenopathy and a small focus of the breast which could in fact also be described as a mass in the tail of the breast. This was biopsied and found to be consistent with breast cancer ER positive PR positive HER-2 negative. Discussion was held the patient and she was switched from Femara to Aromasin and AFFINITOR was added at a dose of 2.5 mg daily. His dose is increased to 5 mg daily and she is finished to taking the last sample does on the 13th of this month. She is experiencing a bit of nausea and some reflux type symptoms. Otherwise she is doing well. Interim History:   As above she is tolerating the Aromasin and benefits were. Apart from some reflux type symptoms. She is due increase her dose to 7.5 mg . She still 5 mg and tolerates it well. She was no complaints denies any  mouth sores. Appetite good weight is stable denies fevers.  Medications:   I have reviewed the patient's current medications.  Allergies: No Known Allergies  Physical Exam: Filed Vitals:   06/28/12 1434  BP: 154/85  Pulse: 81  Temp: 97.8 F (36.6 C)  Resp: 20  Weight: 243 lbs. HEENT:  Sclerae anicteric, conjunctivae pink.  Oropharynx clear.  No mucositis or candidiasis.   Nodes:  No cervical, supraclavicular, or axillary lymphadenopathy palpated.  Lungs:  Clear to auscultation bilaterally.  No crackles, rhonchi, or wheezes.   Heart:  Regular rate and rhythm.   Abdomen:  Soft, nontender.  Positive bowel sounds.  No organomegaly or masses palpated.   Musculoskeletal:  No focal spinal tenderness to palpation.  Extremities:  Benign. No erythema.  Skin:  Benign.   Neuro:  Nonfocal, alert and oriented x 3. BREAST EXAM: In the supine position, with the right arm over the head, the right breast is surgically absent. No reconstruction. No subcutaneous nodules, no redness of the skin. No right axillary adenopathy. With the left arm over the head, the left nipple is everted. No periareolar edema or nipple discharge. No mass in any quadrant or subareolar region. No redness of the skin. She has a palpable lymph node appreciated in the upper portion of the axilla. This is a feels about 2 cm the exact dimensions are difficult to assess. The breast itself is clear of any obvious masses. In the left supraclavicular fossa that appreciate any adenopathy.  Lab Results: Lab Results  Component Value Date   WBC 3.2* 06/28/2012   HGB 8.8*  06/28/2012   HCT 26.2* 06/28/2012   MCV 89.8 06/28/2012   PLT 164 06/28/2012   NEUTROABS 2.0 06/28/2012     Chemistry      Component Value Date/Time   NA 140 05/14/2012 0934   NA 140 03/29/2012 1507   K 4.0 05/14/2012 0934   K 4.2 03/29/2012 1507   CL 107 05/14/2012 0934   CL 101 03/29/2012 1507   CO2 26 05/14/2012 0934   CO2 30 03/29/2012 1507   BUN 19.0 05/14/2012 0934    BUN 18 03/29/2012 1507   CREATININE 1.3* 05/14/2012 0934   CREATININE 1.00 03/29/2012 1507      Component Value Date/Time   CALCIUM 9.3 05/14/2012 0934   CALCIUM 9.7 03/29/2012 1507   ALKPHOS 63 05/14/2012 0934   ALKPHOS 63 03/19/2012 0839   AST 18 05/14/2012 0934   AST 14 03/19/2012 0839   ALT 16 05/14/2012 0934   ALT 13 03/19/2012 0839   BILITOT 0.40 05/14/2012 0934   BILITOT 0.4 03/19/2012 0839       Assessment:  80. 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  2. Contralateral axillary recurrence, HER-2 negative.  3. Currently on our Aromasin and affinitor 4. Cardiac toxicity associated with Herceptin.   Plan:  Patient currently is on 5 mg of affinitor. With Aromasin. As she is tolerating it well. Exam today shows persistent left-sided axillary adenopathy. This may be somewhat smaller than previous exams. I have she tolerates medication well I think we will keep her in the same doses. Of note is that her white count and hemoglobin remain low and secondary likely to previous chemotherapy. Plan to keep you re\re hyphen Femara and Aromasin as it at the same dose. I plan to see her in 2 months time. Pierce Crane, M.D. 06/28/2012

## 2012-07-10 ENCOUNTER — Other Ambulatory Visit (HOSPITAL_COMMUNITY): Payer: Self-pay | Admitting: *Deleted

## 2012-07-10 MED ORDER — SPIRONOLACTONE 25 MG PO TABS: 12.5000 mg | ORAL_TABLET | Freq: Every day | ORAL | Status: DC

## 2012-07-17 ENCOUNTER — Ambulatory Visit (INDEPENDENT_AMBULATORY_CARE_PROVIDER_SITE_OTHER): Payer: PRIVATE HEALTH INSURANCE | Admitting: General Surgery

## 2012-07-17 ENCOUNTER — Encounter (INDEPENDENT_AMBULATORY_CARE_PROVIDER_SITE_OTHER): Payer: Self-pay | Admitting: General Surgery

## 2012-07-17 VITALS — BP 142/86 | HR 94 | Temp 98.3°F | Ht 65.0 in | Wt 241.8 lb

## 2012-07-17 DIAGNOSIS — C50919 Malignant neoplasm of unspecified site of unspecified female breast: Secondary | ICD-10-CM

## 2012-07-17 DIAGNOSIS — C50912 Malignant neoplasm of unspecified site of left female breast: Secondary | ICD-10-CM

## 2012-07-17 NOTE — Patient Instructions (Signed)
Continue medicines for new left sided breast cancer

## 2012-07-19 NOTE — Progress Notes (Signed)
Subjective:     Patient ID: Jeanette Morris, female   DOB: 1946/08/07, 66 y.o.   MRN: [id number]  HPI The patient is a 66 year old black female who is 1-1/2 years status post right modified radical mastectomy for a Tx N3 right breast cancer. She continues to have some soreness on the right chest wall. Otherwise she seems well. She denies any discharge from her left nipple.  Review of Systems  Constitutional: Negative.   HENT: Negative.   Eyes: Negative.   Respiratory: Negative.   Cardiovascular: Negative.   Gastrointestinal: Negative.   Genitourinary: Negative.   Musculoskeletal: Negative.   Skin: Negative.   Neurological: Negative.   Hematological: Negative.   Psychiatric/Behavioral: Negative.        Objective:   Physical Exam  Constitutional: She is oriented to person, place, and time. She appears well-developed and well-nourished.  HENT:  Head: Normocephalic and atraumatic.  Eyes: Conjunctivae normal and EOM are normal. Pupils are equal, round, and reactive to light.  Neck: Normal range of motion. Neck supple.  Cardiovascular: Normal rate, regular rhythm and normal heart sounds.   Pulmonary/Chest: Effort normal and breath sounds normal.       The patient had some large palpable left axillary lymph nodes. There is no palpable mass in the left breast. There is no palpable mass of the right chest wall.  Abdominal: Soft. Bowel sounds are normal. She exhibits no mass. There is no tenderness.  Musculoskeletal: Normal range of motion.  Lymphadenopathy:    She has no cervical adenopathy.  Neurological: She is alert and oriented to person, place, and time.  Skin: Skin is warm and dry.  Psychiatric: She has a normal mood and affect. Her behavior is normal.       Assessment:     The patient is a one half years status post right modified radical mastectomy for an advanced right breast cancer. She now has palpable left axillary lymph nodes. these been evaluated and I believe are  positive for a left-sided breast cancer that is metastatic to the lymph nodes. She has been started on treatment by [name].   Plan:     She will continue treatment for this advanced left-sided cancer per [name]. We will plan to see her back in another month or 2 to check her progress.

## 2012-07-23 ENCOUNTER — Ambulatory Visit (HOSPITAL_COMMUNITY)
Admission: RE | Admit: 2012-07-23 | Discharge: 2012-07-23 | Disposition: A | Discharge status: Home / Self Care | Payer: PRIVATE HEALTH INSURANCE | Source: Ambulatory Visit | Attending: Internal Medicine | Admitting: Internal Medicine

## 2012-07-23 ENCOUNTER — Ambulatory Visit (HOSPITAL_BASED_OUTPATIENT_CLINIC_OR_DEPARTMENT_OTHER)
Admission: RE | Admit: 2012-07-23 | Discharge: 2012-07-23 | Disposition: A | Discharge status: Home / Self Care | Payer: PRIVATE HEALTH INSURANCE | Source: Ambulatory Visit | Attending: Internal Medicine | Admitting: Internal Medicine

## 2012-07-23 ENCOUNTER — Other Ambulatory Visit (HOSPITAL_COMMUNITY): Payer: Self-pay | Admitting: *Deleted

## 2012-07-23 ENCOUNTER — Encounter (HOSPITAL_COMMUNITY): Payer: Self-pay

## 2012-07-23 VITALS — BP 150/86 | HR 102 | Wt 237.8 lb

## 2012-07-23 DIAGNOSIS — Z09 Encounter for follow-up examination after completed treatment for conditions other than malignant neoplasm: Secondary | ICD-10-CM

## 2012-07-23 DIAGNOSIS — I427 Cardiomyopathy due to drug and external agent: Secondary | ICD-10-CM

## 2012-07-23 DIAGNOSIS — Z9221 Personal history of antineoplastic chemotherapy: Secondary | ICD-10-CM | POA: Insufficient documentation

## 2012-07-23 DIAGNOSIS — T451X5A Adverse effect of antineoplastic and immunosuppressive drugs, initial encounter: Secondary | ICD-10-CM

## 2012-07-23 DIAGNOSIS — I429 Cardiomyopathy, unspecified: Secondary | ICD-10-CM | POA: Insufficient documentation

## 2012-07-23 DIAGNOSIS — I10 Essential (primary) hypertension: Secondary | ICD-10-CM

## 2012-07-23 DIAGNOSIS — C50919 Malignant neoplasm of unspecified site of unspecified female breast: Secondary | ICD-10-CM | POA: Insufficient documentation

## 2012-07-23 MED ORDER — SPIRONOLACTONE 25 MG PO TABS: 25.0000 mg | ORAL_TABLET | Freq: Every day | ORAL | Status: DC

## 2012-07-23 NOTE — Assessment & Plan Note (Addendum)
Volume status stable. ECHO reviewed and discussed during office visit. EF and lateral S' improved. Increase Spironolactone to 25 mg daily. Check BMET next week. Ok to resume Herpcetin. Follow up in 3 months with an ECHO.   Patient seen and examined with Tonye Becket, NP. We discussed all aspects of the encounter. I agree with the assessment and plan as stated above.  I reviewed echos personally. EF and Doppler parameters are improved. No HF on exam. Will d/w [name]. She can restart Herceptin cautiously. Will increase sprio to 25 mg daily for HTN. At next visit, titrate carvedilol to 25 bid.

## 2012-07-23 NOTE — Assessment & Plan Note (Signed)
BP up. Increase spiro to 25 daily.

## 2012-07-23 NOTE — Progress Notes (Signed)
Patient ID: Jeanette Morris, female   DOB: [birth year], 66 y.o.   MRN: [id number] HPI:  Jeanette Morris is a 66 y/o woman with h/o HTN and obesity. Denies any known h/o CAD or HF.  She was diagnosed with R Breast CA in 9/11. Underwent neoadjuvant chemo with (TCH regimen) followed by carboplatin and Taxotere x 6 cycles. She is status post rt mastectomy in April 2012 LN+. ER/PR/NER 2-neu +. Post-op had XRT. [name] would like to start her on Herceptin. However had echocardiogram on 01/06/11 which was read as EF 45-50% and so referred for cardiology clearance.  Her echos have been reviewed, dating back to 2003. EF has remained in 40-50% range without significant change. Lateral S' velocity also unchanged around 10.5 cm/sec - although some echos with poor windows.  With her EF running on the low end of the normal range for almost a decade and the importance of the Herceptin therapy, we cleared her for Herceptin therapy starting in September 2012 with concomitant therapy with ACE-I and b-blocker. However, there was concern that EF was drifting down and Herceptin was stopped in January and she has not been on since.  Echos: 05/03/11: 50-55%, lateral s' velocity around 11.5 cm/sec.   09/22/11: 50-55%, lateral s' 10.5-11 but poor window 12/21/11: 45-50% LVIDd 5.0 cm (up from 4.4) Lat s' 6.7cm/sec (poor windows) 02/02/12: 40-45%%   7.1 02/23/12 Cardiac MRI: EF 38%. No scarring 05/28/12 EF 45% lateral S' 10.1 07/23/12 EF50-55%  Lateral S' 11.2  She returns today for follow up. Heceptin stopped in August. She remains on Aromasin and Affinitor. Last visit carvedilol titrated up to 18.75 mg bid. Complains of increased dyspnea over the last week. + Orthopnea. She thinks dyspnea is due to painting in house. She has not been weighing at home . Denies lower extremity edema. Compliant with medications.    .    ROS: All other systems normal except as mentioned in HPI, past medical history and problem list.    Past Medical  History  Diagnosis Date  . Hypertension   . Hearing loss     right ear  . Hyperlipidemia   . Arthritis   . 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 Breast cancer     (Rt) breast ca dx 9/11    Current Outpatient Prescriptions  Medication Sig Dispense Refill  . aspirin 81 MG tablet Take 81 mg by mouth daily.        . Calcium Carbonate-Vitamin D 600-400 MG-UNIT per tablet Take 1 tablet by mouth daily.        . carvedilol (COREG) 12.5 MG tablet Take 2 tablets (25 mg total) by mouth 2 (two) times daily with a meal.  90 tablet  6  . carvedilol (COREG) 6.25 MG tablet Take 1 tablet (6.25 mg total) by mouth 2 (two) times daily with a meal.  60 tablet  2  . everolimus (AFINITOR) 5 MG tablet Take 5 mg by mouth daily.      Marland Kitchen exemestane (AROMASIN) 25 MG tablet Take 25 mg by mouth daily after breakfast.      . ferrous sulfate 325 (65 FE) MG tablet Take 325 mg by mouth daily with breakfast.        . gabapentin (NEURONTIN) 300 MG capsule Take 300 mg by mouth at bedtime.        Marland Kitchen lisinopril (PRINIVIL,ZESTRIL) 40 MG tablet Take 1 tablet (40 mg total) by mouth daily.  30 tablet  6  .  oxyCODONE-acetaminophen (PERCOCET/ROXICET) 5-325 MG per tablet       . spironolactone (ALDACTONE) 25 MG tablet Take 0.5 tablets (12.5 mg total) by mouth daily.  30 tablet  3   No current facility-administered medications for this encounter.   Facility-Administered Medications Ordered in Other Encounters  Medication Dose Route Frequency Provider Last Rate Last Dose  . lidocaine-prilocaine (EMLA) cream   Topical Once Pierce Crane, MD         No Known Allergies   PHYSICAL EXAM: Filed Vitals:   07/23/12 0855  BP: 150/86  Pulse: 102  Weight: 237 lb 12.8 oz (107.865 kg)  SpO2: 93%   General:  Obese. Chronically ill appearing.  No respiratory difficulty (husband present) HEENT: normal Neck: supple. no JVD. Carotids 2+ bilat; no bruits. No lymphadenopathy or thryomegaly appreciated. S/p R mastectomy Cor: PMI  nondisplaced. Regular rate & rhythm. No rubs, gallops or murmurs. Lungs: clear Abdomen: obese soft, nontender, nondistended. No hepatosplenomegaly. No bruits or masses. Good bowel sounds. Extremities: no cyanosis, clubbing, rash, edema Neuro: alert & oriented x 3, cranial nerves grossly intact. moves all 4 extremities w/o difficulty. Affect pleasant.    ASSESSMENT & PLAN:

## 2012-07-23 NOTE — Patient Instructions (Addendum)
Take Spironolactone to 25 mg daily  Follow up in in 3 months with an ECHO and Dr Gala Romney

## 2012-07-23 NOTE — Progress Notes (Signed)
Echocardiogram 2D Echocardiogram limited has been performed.  Jeanette Morris 07/23/2012, 8:43 AM

## 2012-07-24 ENCOUNTER — Encounter: Payer: Self-pay | Admitting: Oncology

## 2012-07-24 NOTE — Progress Notes (Signed)
07/24/2012 On Friday, November 22,2013 I started a pre-auth request for this patient to have a 2D ECHO.  I spoke to a nurse reviewer with her insurance and this request went ot medical review because she had a 2 D Echo on Sept. 30, 2013.  I called [employer] back and was told this request had gone to PEER-TO-PEER Review.  I called Dr. Renelda Loma nurse and left a voice message concerning this request.  Deanna Artis called me back on Monday to tell me that Dr. Prescott Gum office requested the exam and not herre.  Clara Jason Fila contacted me this morning concerning this same 2 d Echo being done without authorization.  I am not quite sure what needs to happen now with this.  I spoke with [facility] @ [facility] and informed them this request was not authorized.  Bonita Quin [phone]

## 2012-07-29 ENCOUNTER — Emergency Department (HOSPITAL_COMMUNITY): Payer: PRIVATE HEALTH INSURANCE

## 2012-07-29 ENCOUNTER — Inpatient Hospital Stay (HOSPITAL_COMMUNITY)
Admission: EM | Admit: 2012-07-29 | Discharge: 2012-08-05 | DRG: 194 | Disposition: A | Discharge status: Home / Self Care | Payer: PRIVATE HEALTH INSURANCE | Attending: Internal Medicine | Admitting: Internal Medicine

## 2012-07-29 ENCOUNTER — Encounter (HOSPITAL_COMMUNITY): Payer: Self-pay

## 2012-07-29 ENCOUNTER — Other Ambulatory Visit: Payer: Self-pay

## 2012-07-29 DIAGNOSIS — C799 Secondary malignant neoplasm of unspecified site: Secondary | ICD-10-CM

## 2012-07-29 DIAGNOSIS — Z79899 Other long term (current) drug therapy: Secondary | ICD-10-CM

## 2012-07-29 DIAGNOSIS — I427 Cardiomyopathy due to drug and external agent: Secondary | ICD-10-CM | POA: Diagnosis present

## 2012-07-29 DIAGNOSIS — I10 Essential (primary) hypertension: Secondary | ICD-10-CM | POA: Diagnosis present

## 2012-07-29 DIAGNOSIS — I5030 Unspecified diastolic (congestive) heart failure: Secondary | ICD-10-CM | POA: Diagnosis present

## 2012-07-29 DIAGNOSIS — I509 Heart failure, unspecified: Secondary | ICD-10-CM | POA: Diagnosis present

## 2012-07-29 DIAGNOSIS — J9819 Other pulmonary collapse: Secondary | ICD-10-CM | POA: Diagnosis present

## 2012-07-29 DIAGNOSIS — Z9010 Acquired absence of unspecified breast and nipple: Secondary | ICD-10-CM

## 2012-07-29 DIAGNOSIS — K828 Other specified diseases of gallbladder: Secondary | ICD-10-CM | POA: Diagnosis present

## 2012-07-29 DIAGNOSIS — D6481 Anemia due to antineoplastic chemotherapy: Secondary | ICD-10-CM | POA: Diagnosis present

## 2012-07-29 DIAGNOSIS — L989 Disorder of the skin and subcutaneous tissue, unspecified: Secondary | ICD-10-CM

## 2012-07-29 DIAGNOSIS — I428 Other cardiomyopathies: Secondary | ICD-10-CM

## 2012-07-29 DIAGNOSIS — I429 Cardiomyopathy, unspecified: Secondary | ICD-10-CM

## 2012-07-29 DIAGNOSIS — E785 Hyperlipidemia, unspecified: Secondary | ICD-10-CM | POA: Diagnosis present

## 2012-07-29 DIAGNOSIS — C50912 Malignant neoplasm of unspecified site of left female breast: Secondary | ICD-10-CM

## 2012-07-29 DIAGNOSIS — E860 Dehydration: Secondary | ICD-10-CM | POA: Diagnosis present

## 2012-07-29 DIAGNOSIS — N179 Acute kidney failure, unspecified: Secondary | ICD-10-CM | POA: Diagnosis present

## 2012-07-29 DIAGNOSIS — T451X5A Adverse effect of antineoplastic and immunosuppressive drugs, initial encounter: Secondary | ICD-10-CM | POA: Diagnosis present

## 2012-07-29 DIAGNOSIS — C50919 Malignant neoplasm of unspecified site of unspecified female breast: Secondary | ICD-10-CM | POA: Diagnosis present

## 2012-07-29 DIAGNOSIS — J159 Unspecified bacterial pneumonia: Principal | ICD-10-CM | POA: Diagnosis present

## 2012-07-29 DIAGNOSIS — J189 Pneumonia, unspecified organism: Secondary | ICD-10-CM | POA: Diagnosis present

## 2012-07-29 DIAGNOSIS — J90 Pleural effusion, not elsewhere classified: Secondary | ICD-10-CM | POA: Diagnosis present

## 2012-07-29 LAB — COMPREHENSIVE METABOLIC PANEL
ALT: 16 U/L (ref 0–35)
AST: 26 U/L (ref 0–37)
CO2: 27 mEq/L (ref 19–32)
Chloride: 99 mEq/L (ref 96–112)
Creatinine, Ser: 1.08 mg/dL (ref 0.50–1.10)
GFR calc non Af Amer: 52 mL/min — ABNORMAL LOW (ref >90)
Sodium: 135 mEq/L (ref 135–145)
Total Bilirubin: 0.3 mg/dL (ref 0.3–1.2)

## 2012-07-29 LAB — CBC WITH DIFFERENTIAL/PLATELET
Basophils Absolute: 0 10*3/uL (ref 0.0–0.1)
HCT: 26.7 % — ABNORMAL LOW (ref 36.0–46.0)
Lymphocytes Relative: 17 % (ref 12–46)
Monocytes Absolute: 0.4 10*3/uL (ref 0.1–1.0)
Neutro Abs: 2.3 10*3/uL (ref 1.7–7.7)
RBC: 3.13 MIL/uL — ABNORMAL LOW (ref 3.87–5.11)
RDW: 13.3 % (ref 11.5–15.5)
WBC: 3.3 10*3/uL — ABNORMAL LOW (ref 4.0–10.5)

## 2012-07-29 LAB — STREP PNEUMONIAE URINARY ANTIGEN: Strep Pneumo Urinary Antigen: NEGATIVE

## 2012-07-29 LAB — MAGNESIUM: Magnesium: 1.9 mg/dL (ref 1.5–2.5)

## 2012-07-29 MED ORDER — FUROSEMIDE 10 MG/ML IJ SOLN
20.0000 mg | Freq: Every day | INTRAMUSCULAR | Status: DC
  Administered 2012-07-29 – 2012-07-30 (×2): 20 mg via INTRAVENOUS
  Filled 2012-07-29 (×2): qty 2

## 2012-07-29 MED ORDER — ALBUTEROL SULFATE (5 MG/ML) 0.5% IN NEBU
2.5000 mg | INHALATION_SOLUTION | RESPIRATORY_TRACT | Status: DC | PRN
  Administered 2012-07-30: 2.5 mg via RESPIRATORY_TRACT
  Filled 2012-07-29: qty 0.5

## 2012-07-29 MED ORDER — ASPIRIN 81 MG PO CHEW
81.0000 mg | CHEWABLE_TABLET | Freq: Every day | ORAL | Status: DC
  Administered 2012-07-29 – 2012-08-05 (×8): 81 mg via ORAL
  Filled 2012-07-29 (×8): qty 1

## 2012-07-29 MED ORDER — DEXTROSE 5 % IV SOLN
500.0000 mg | Freq: Once | INTRAVENOUS | Status: AC
  Administered 2012-07-29: 500 mg via INTRAVENOUS
  Filled 2012-07-29: qty 500

## 2012-07-29 MED ORDER — PIPERACILLIN-TAZOBACTAM 3.375 G IVPB
3.3750 g | Freq: Three times a day (TID) | INTRAVENOUS | Status: DC
  Administered 2012-07-29 – 2012-08-04 (×19): 3.375 g via INTRAVENOUS
  Filled 2012-07-29 (×22): qty 50

## 2012-07-29 MED ORDER — GABAPENTIN 300 MG PO CAPS
300.0000 mg | ORAL_CAPSULE | Freq: Every day | ORAL | Status: DC
  Administered 2012-07-29 – 2012-08-04 (×7): 300 mg via ORAL
  Filled 2012-07-29 (×8): qty 1

## 2012-07-29 MED ORDER — CALCIUM CARBONATE-VITAMIN D 500-200 MG-UNIT PO TABS
1.0000 | ORAL_TABLET | Freq: Every day | ORAL | Status: DC
  Administered 2012-07-29 – 2012-08-05 (×8): 1 via ORAL
  Filled 2012-07-29 (×8): qty 1

## 2012-07-29 MED ORDER — MORPHINE SULFATE 2 MG/ML IJ SOLN
1.0000 mg | INTRAMUSCULAR | Status: DC | PRN
  Administered 2012-07-29 – 2012-07-30 (×3): 1 mg via INTRAVENOUS
  Filled 2012-07-29 (×3): qty 1

## 2012-07-29 MED ORDER — HEPARIN SODIUM (PORCINE) 5000 UNIT/ML IJ SOLN
5000.0000 [IU] | Freq: Three times a day (TID) | INTRAMUSCULAR | Status: DC
  Administered 2012-07-29 – 2012-08-04 (×20): 5000 [IU] via SUBCUTANEOUS
  Filled 2012-07-29 (×24): qty 1

## 2012-07-29 MED ORDER — CALCIUM CARBONATE-VITAMIN D 600-400 MG-UNIT PO TABS
1.0000 | ORAL_TABLET | Freq: Every morning | ORAL | Status: DC
Start: 2012-07-29 — End: 2012-07-29

## 2012-07-29 MED ORDER — SODIUM CHLORIDE 0.9 % IJ SOLN
3.0000 mL | Freq: Two times a day (BID) | INTRAMUSCULAR | Status: DC
  Administered 2012-07-31 – 2012-08-04 (×2): 3 mL via INTRAVENOUS

## 2012-07-29 MED ORDER — EXEMESTANE 25 MG PO TABS
25.0000 mg | ORAL_TABLET | Freq: Every day | ORAL | Status: DC
  Administered 2012-07-30 – 2012-08-05 (×7): 25 mg via ORAL
  Filled 2012-07-29 (×9): qty 1

## 2012-07-29 MED ORDER — SODIUM CHLORIDE 0.9 % IV SOLN: 250.0000 mL | INTRAVENOUS | Status: DC | PRN

## 2012-07-29 MED ORDER — VANCOMYCIN HCL IN DEXTROSE 1-5 GM/200ML-% IV SOLN
1000.0000 mg | Freq: Two times a day (BID) | INTRAVENOUS | Status: DC
  Administered 2012-07-29 – 2012-07-30 (×2): 1000 mg via INTRAVENOUS
  Filled 2012-07-29 (×4): qty 200

## 2012-07-29 MED ORDER — SODIUM CHLORIDE 0.9 % IV SOLN
Freq: Once | INTRAVENOUS | Status: AC
  Administered 2012-07-29: 10:00:00 via INTRAVENOUS

## 2012-07-29 MED ORDER — FERROUS SULFATE 325 (65 FE) MG PO TABS
325.0000 mg | ORAL_TABLET | Freq: Two times a day (BID) | ORAL | Status: DC
  Administered 2012-07-29 – 2012-08-05 (×14): 325 mg via ORAL
  Filled 2012-07-29 (×16): qty 1

## 2012-07-29 MED ORDER — VANCOMYCIN HCL IN DEXTROSE 1-5 GM/200ML-% IV SOLN
1000.0000 mg | Freq: Once | INTRAVENOUS | Status: AC
  Administered 2012-07-29: 1000 mg via INTRAVENOUS
  Filled 2012-07-29: qty 200

## 2012-07-29 MED ORDER — OXYCODONE-ACETAMINOPHEN 5-325 MG PO TABS
1.0000 | ORAL_TABLET | Freq: Four times a day (QID) | ORAL | Status: DC | PRN
  Administered 2012-07-30 – 2012-08-05 (×5): 1 via ORAL
  Filled 2012-07-29 (×4): qty 1

## 2012-07-29 MED ORDER — DOCUSATE SODIUM 100 MG PO CAPS
100.0000 mg | ORAL_CAPSULE | Freq: Two times a day (BID) | ORAL | Status: DC
  Administered 2012-07-29 – 2012-08-05 (×12): 100 mg via ORAL
  Filled 2012-07-29 (×16): qty 1

## 2012-07-29 MED ORDER — CARVEDILOL 6.25 MG PO TABS
6.2500 mg | ORAL_TABLET | Freq: Every morning | ORAL | Status: DC
  Administered 2012-07-29 – 2012-08-02 (×5): 6.25 mg via ORAL
  Filled 2012-07-29 (×6): qty 1

## 2012-07-29 MED ORDER — EVEROLIMUS 5 MG PO TABS: 5.0000 mg | ORAL_TABLET | Freq: Every day | ORAL | Status: DC

## 2012-07-29 MED ORDER — SODIUM CHLORIDE 0.9 % IJ SOLN: 3.0000 mL | INTRAMUSCULAR | Status: DC | PRN

## 2012-07-29 MED ORDER — ONDANSETRON HCL 4 MG/2ML IJ SOLN
4.0000 mg | Freq: Four times a day (QID) | INTRAMUSCULAR | Status: DC | PRN
  Administered 2012-08-03: 4 mg via INTRAVENOUS
  Filled 2012-07-29: qty 2

## 2012-07-29 MED ORDER — ONDANSETRON HCL 4 MG PO TABS: 4.0000 mg | ORAL_TABLET | Freq: Four times a day (QID) | ORAL | Status: DC | PRN

## 2012-07-29 MED ORDER — PIPERACILLIN-TAZOBACTAM 3.375 G IVPB
3.3750 g | Freq: Once | INTRAVENOUS | Status: AC
  Administered 2012-07-29: 3.375 g via INTRAVENOUS
  Filled 2012-07-29: qty 50

## 2012-07-29 MED ORDER — SPIRONOLACTONE 25 MG PO TABS
25.0000 mg | ORAL_TABLET | Freq: Every day | ORAL | Status: DC
  Administered 2012-07-29 – 2012-08-01 (×4): 25 mg via ORAL
  Filled 2012-07-29 (×4): qty 1

## 2012-07-29 MED ORDER — CARVEDILOL 12.5 MG PO TABS
12.5000 mg | ORAL_TABLET | Freq: Every morning | ORAL | Status: DC
  Administered 2012-07-29 – 2012-08-02 (×5): 12.5 mg via ORAL
  Filled 2012-07-29 (×6): qty 1

## 2012-07-29 NOTE — ED Notes (Signed)
Pt sts intermittent L side chest pain and SOB x 4 days.  Sts worsening symptoms with exertion.  Pain score 8/10.  Hx HTN and High cholesterol.  Vitals currently stable.

## 2012-07-29 NOTE — ED Provider Notes (Signed)
History     CSN: [id number]  Arrival date & time 07/29/12  1308   First MD Initiated Contact with Patient 07/29/12 [phone]      Chief Complaint  Patient presents with  . Chest Pain  . Shortness of Breath    (Consider location/radiation/quality/duration/timing/severity/associated sxs/prior treatment) Patient is a 66 y.o. female presenting with chest pain and shortness of breath. The history is provided by the patient.  Chest Pain Primary symptoms include shortness of breath. Pertinent negatives for primary symptoms include no fever, no fatigue, no abdominal pain, no nausea and no vomiting.  Pertinent negatives for associated symptoms include no numbness and no weakness.    Shortness of Breath  Associated symptoms include chest pain and shortness of breath. Pertinent negatives include no fever.   patient presents with sharp left-sided chest pain and some shortness of breath the last 4 days. She states the pain comes and goes and lasts a few seconds. She states that she can make it come on by exertion. No cough. No fevers. She is on oral chemotherapy for breast cancer. She's had some cardiomyopathy due to one of them. She saw her cardiologist last week and medications were adjusted. She states it began after Thanksgiving. She states she caught in a good meal on Thanksgiving. She states she's not been weighing herself. She states her legs are not swollen.  Past Medical History  Diagnosis Date  . Hypertension   . Hearing loss     right ear  . Hyperlipidemia   . Arthritis   . 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 Breast cancer     (Rt) breast ca dx 9/11    Past Surgical History  Procedure Date  . Tubal ligation   . Portacath placement   . Abdominal hysterectomy   . Lung biopsy   . Lesion excision     L anterior thigh  . Breast surgery 12/01/10    ER+,PR+,Ki 67 64%,Her2 -; right breast lumpectomy    History reviewed. No pertinent family history.  History  Substance  Use Topics  . Smoking status: Former Smoker    Quit date: 06/30/1973  . Smokeless tobacco: Never Used  . Alcohol Use: No    OB History    Grav Para Term Preterm Abortions TAB SAB Ect Mult Living                  Review of Systems  Constitutional: Negative for fever, activity change, appetite change and fatigue.  HENT: Negative for neck stiffness.   Eyes: Negative for pain.  Respiratory: Positive for shortness of breath. Negative for chest tightness.   Cardiovascular: Positive for chest pain. Negative for leg swelling.  Gastrointestinal: Negative for nausea, vomiting, abdominal pain and diarrhea.  Genitourinary: Negative for flank pain.  Musculoskeletal: Negative for back pain.  Skin: Negative for rash.  Neurological: Negative for weakness, numbness and headaches.  Psychiatric/Behavioral: Negative for behavioral problems.    Allergies  Review of patient's allergies indicates no known allergies.  Home Medications   No current outpatient prescriptions on file.  BP 160/79  Pulse 99  Temp 98.4 F (36.9 C) (Oral)  Resp 22  Ht 5\' 5"  (1.651 m)  Wt 237 lb (107.502 kg)  BMI 39.44 kg/m2  SpO2 98%  Physical Exam  Constitutional: She is oriented to person, place, and time. She appears well-developed and well-nourished.  HENT:  Head: Normocephalic.  Eyes: Pupils are equal, round, and reactive to light.  Cardiovascular: Normal rate and regular rhythm.   Pulmonary/Chest: Effort normal and breath sounds normal.       Post right mastectomy  Abdominal: Soft.  Musculoskeletal: Normal range of motion. She exhibits edema.       Edema slightly more prominent on left than right. Mild edema overall  Neurological: She is alert and oriented to person, place, and time.  Skin: Skin is warm.    ED Course  Procedures (including critical care time)  Labs Reviewed  CBC WITH DIFFERENTIAL - Abnormal; Notable for the following:    WBC 3.3 (*)     RBC 3.13 (*)     Hemoglobin 8.7 (*)      HCT 26.7 (*)     Lymphs Abs 0.6 (*)     All other components within normal limits  COMPREHENSIVE METABOLIC PANEL - Abnormal; Notable for the following:    Glucose, Bld 124 (*)     Albumin 3.1 (*)     GFR calc non Af Amer 52 (*)     GFR calc Af Amer 60 (*)     All other components within normal limits  PRO B NATRIURETIC PEPTIDE - Abnormal; Notable for the following:    Pro B Natriuretic peptide (BNP) 509.5 (*)     All other components within normal limits  TROPONIN I  STREP PNEUMONIAE URINARY ANTIGEN  LEGIONELLA ANTIGEN, URINE  TROPONIN I  TROPONIN I  MAGNESIUM   Dg Chest 2 View  07/29/2012  *RADIOLOGY REPORT*  Clinical Data: Short of breath, chest pain  CHEST - 2 VIEW  Comparison: Most recent prior PET CT 04/03/2012  Findings: Interval development of a dense right basilar opacity with thickening versus fluid in the elevated minor fissure.  The lateral view is limited by kyphotic patient positioning and body habitus.  This likely represents a combination of right-sided pleural effusion with right lower lobe and inferior right middle lobe atelectasis versus consolidation.  The left lung is clear. Unchanged cardiac and mediastinal contours.  Left subclavian approach portacatheter remains in unchanged position.  The catheter tip projects over the confluence of the left brachiocephalic vein and superior vena cava and is directed perpendicular into the lateral caval wall. Surgical clips the right axilla.  IMPRESSION:  1.  Interval development of right-sided pleural effusion and dense right lower lobe and inferior right middle lobe opacity raising concern for pneumonia with parapneumonic effusion.  Given the patient's prior history of breast cancer, and underlying nodule or mass is difficult to exclude.  2.  Unchanged position of left subclavian approach portacatheter with the tip perpendicular to the superior vena cava.   Original Report Authenticated By: Malachy Moan, M.D.      1. CAP  (community acquired pneumonia)   2. Metastatic cancer   3. Breast cancer   4. Cardiomyopathy   5. Healthcare-associated pneumonia      Date: 07/29/2012  Rate: 88  Rhythm: normal sinus rhythm  QRS Axis: normal  Intervals: normal  ST/T Wave abnormalities: nonspecific T wave changes  Conduction Disutrbances:none  Narrative Interpretation: Q waves inferiorly  Old EKG Reviewed: unchanged    MDM  Patient with shortness of breath. She is on chemotherapy but has been inpatient recently. X-ray shows pleural effusion and right lower and middle lobe opacity. Possible pneumonia with parapneumonic effusion. Mass is also a possibility. Patient's been unable to lie flat for a CT to clarify this. She's not in severe respiratory distress but is tachypneic. Her oxygen level is good. She'll be  admitted to medicine for further evaluation        Juliet Rude. Rubin Payor, MD 07/29/12 [phone]

## 2012-07-29 NOTE — ED Notes (Signed)
Pt cannot tolerate lying flat for CT scan, Dr Rubin Payor aware of test not done

## 2012-07-29 NOTE — Progress Notes (Signed)
ANTIBIOTIC CONSULT NOTE - INITIAL  Pharmacy Consult for Vancomycin and Zosyn Indication: rule out pneumonia  No Known Allergies  Patient Measurements:   Adjusted Body Weight:  Vital Signs: Temp: 98.3 F (36.8 C) (12/01 0931) Temp src: Oral (12/01 0931) BP: 176/83 mmHg (12/01 0931) Pulse Rate: 91  (12/01 1230) Intake/Output from previous day:   Intake/Output from this shift:    Labs:  Basename 07/29/12 0951  WBC 3.3*  HGB 8.7*  PLT 204  LABCREA --  CREATININE 1.08   The CrCl is unknown because both a height and weight (above a minimum accepted value) are required for this calculation. No results found for this basename: VANCOTROUGH:2,VANCOPEAK:2,VANCORANDOM:2,GENTTROUGH:2,GENTPEAK:2,GENTRANDOM:2,TOBRATROUGH:2,TOBRAPEAK:2,TOBRARND:2,AMIKACINPEAK:2,AMIKACINTROU:2,AMIKACIN:2, in the last 72 hours   Microbiology: No results found for this or any previous visit (from the past 720 hour(s)).  Medical History: Past Medical History  Diagnosis Date  . Hypertension   . Hearing loss     right ear  . Hyperlipidemia   . Arthritis   . 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 Breast cancer     (Rt) breast ca dx 9/11   Medications:  Prescriptions prior to admission  Medication Sig Dispense Refill  . aspirin 81 MG chewable tablet Chew 81 mg by mouth daily.      . Calcium Carbonate-Vitamin D 600-400 MG-UNIT per tablet Take 1 tablet by mouth every morning.       . carvedilol (COREG) 12.5 MG tablet Take 12.5 mg by mouth every morning. Take With 6.25 mg tablet to make a total strength of 18.75mg       . carvedilol (COREG) 6.25 MG tablet Take 6.25 mg by mouth every morning. Take With 12.5 mg tablet to make a total strength of 18.75mg       . everolimus (AFINITOR) 5 MG tablet Take 5 mg by mouth daily after breakfast.       . exemestane (AROMASIN) 25 MG tablet Take 25 mg by mouth daily after breakfast.      . gabapentin (NEURONTIN) 300 MG capsule Take 300 mg by mouth at bedtime.         Marland Kitchen lisinopril (PRINIVIL,ZESTRIL) 40 MG tablet Take 40 mg by mouth every morning.      Marland Kitchen oxyCODONE-acetaminophen (PERCOCET/ROXICET) 5-325 MG per tablet Take 1-2 tablets by mouth every 6 (six) hours as needed. For pain      . spironolactone (ALDACTONE) 25 MG tablet Take 1 tablet (25 mg total) by mouth daily.  30 tablet  3   Anti-infectives     Start     Dose/Rate Route Frequency Ordered Stop   07/29/12 1145   piperacillin-tazobactam (ZOSYN) IVPB 3.375 g        3.375 g 12.5 mL/hr over 240 Minutes Intravenous  Once 07/29/12 1138     07/29/12 1145   vancomycin (VANCOCIN) IVPB 1000 mg/200 mL premix        1,000 mg 200 mL/hr over 60 Minutes Intravenous  Once 07/29/12 1138     07/29/12 1145   azithromycin (ZITHROMAX) 500 mg in dextrose 5 % 250 mL IVPB        500 mg 250 mL/hr over 60 Minutes Intravenous  Once 07/29/12 1138 07/29/12 1258         Assessment:  66yo F admitted with CP and SOB. Hx breast Ca on oral chemotherapy.  Started Vancomycin and Zosyn in the ER for possible pneumonia.  Weight 108kg  SCr 1.1, CrCl ~60ml/min.  Goal of Therapy:  Vancomycin trough level  15-20 mcg/ml  Plan:   Zosyn 3.375g IV Q8H infused over 4hrs.  Vancomycin 1g IV q12h.  Measure Vanc trough at steady state.  Follow up renal fxn and culture results.  Charolotte Eke, PharmD, pager [phone]. 07/29/2012,1:52 PM.

## 2012-07-29 NOTE — ED Notes (Addendum)
Patient transported to X-ray 

## 2012-07-29 NOTE — H&P (Signed)
[facility] History and Physical  Chalonda Schlatter ZOX:[id number] DOB: Jun 06, 1946 DOA: 07/29/2012  Referring physician: Dr. Rubin Payor.  PCP: Egbert Garibaldi, NP  Specialists: Primary Oncologist: Dr Donnie Coffin.                       Cardiologist Dr Gala Romney  Chief Complaint: SOB.  HPI: Keryn Nessler is a 66 y.o. female with PMH of  inflammatory carcinoma of the right breast, s/p right modified radical mastectomy with axillary node dissection and chemo, Contralateral axillary recurrence, HER-2 negative, Cardiomyopathy who presents to ED complaining of shortness of breath that started after thanksgiving. She relates dyspnea got worse last night. She describe orthopnea. She relates chest pain. She relates shortness of breath on exertion. She denies cough, fevers.     Review of Systems: The patient denies anorexia, fever, weight loss,, vision loss, decreased hearing, hoarseness,  syncope,  peripheral edema, balance deficits, hemoptysis, abdominal pain, melena, hematochezia, severe indigestion/heartburn, hematuria, incontinence, genital sores, muscle weakness, suspicious skin lesions, transient blindness, difficulty walking, depression, unusual weight change, abnormal bleeding, , angioedema,   Past Medical History  Diagnosis Date  . Hypertension   . Hearing loss     right ear  . Hyperlipidemia   . Arthritis   . 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 Breast cancer     (Rt) breast ca dx 9/11   Past Surgical History  Procedure Date  . Tubal ligation   . Portacath placement   . Abdominal hysterectomy   . Lung biopsy   . Lesion excision     L anterior thigh  . Breast surgery 12/01/10    ER+,PR+,Ki 67 64%,Her2 -; right breast lumpectomy   Social History:  reports that she quit smoking about 39 years ago. She has never used smokeless tobacco. She reports that she does not drink alcohol or use illicit drugs.  patient live-home, with husband.   No Known Allergies  Family  History: Mother died, had Alzheimer. Father died in a car accident.   Prior to Admission medications   Medication Sig Start Date End Date Taking? Authorizing Provider  aspirin 81 MG chewable tablet Chew 81 mg by mouth daily.   Yes Historical Provider, MD  Calcium Carbonate-Vitamin D 600-400 MG-UNIT per tablet Take 1 tablet by mouth every morning.    Yes Historical Provider, MD  carvedilol (COREG) 12.5 MG tablet Take 12.5 mg by mouth every morning. Take With 6.25 mg tablet to make a total strength of 18.75mg  05/28/12 05/28/13 Yes Bevelyn Buckles Bensimhon, MD  carvedilol (COREG) 6.25 MG tablet Take 6.25 mg by mouth every morning. Take With 12.5 mg tablet to make a total strength of 18.75mg  05/28/12  Yes [name] D Clegg, NP  everolimus (AFINITOR) 5 MG tablet Take 5 mg by mouth daily after breakfast.  04/30/12  Yes Historical Provider, MD  exemestane (AROMASIN) 25 MG tablet Take 25 mg by mouth daily after breakfast.   Yes Historical Provider, MD  gabapentin (NEURONTIN) 300 MG capsule Take 300 mg by mouth at bedtime.     Yes Historical Provider, MD  lisinopril (PRINIVIL,ZESTRIL) 40 MG tablet Take 40 mg by mouth every morning. 12/22/11  Yes Dolores Patty, MD  oxyCODONE-acetaminophen (PERCOCET/ROXICET) 5-325 MG per tablet Take 1-2 tablets by mouth every 6 (six) hours as needed. For pain 03/19/12  Yes Historical Provider, MD  spironolactone (ALDACTONE) 25 MG tablet Take 1 tablet (25 mg total) by mouth daily. 07/23/12 07/23/13 Yes [name]  Georgie Chard, NP   Physical Exam: Filed Vitals:   07/29/12 0931 07/29/12 1230  BP: 176/83   Pulse: 87 91  Temp: 98.3 F (36.8 C)   TempSrc: Oral   Resp: 28 23  SpO2: 97% 96%    General Appearance:    Alert, cooperative, no distress, appears stated age  Head:    Normocephalic, without obvious abnormality, atraumatic  Eyes:    PERRL, conjunctiva/corneas clear, EOM's intact, fundi    benign, both eyes  Ears:    Normal TM's and external ear canals, both ears  Nose:   Nares normal,  septum midline, mucosa normal, no drainage    or sinus tenderness  Throat:   Lips, mucosa, and tongue normal; teeth and gums normal  Neck:   Supple, symmetrical, trachea midline, no adenopathy;    thyroid:  no enlargement/tenderness/nodules; no carotid   bruit ,  Positive JVD   Back:     Symmetric, no curvature, ROM normal, no CVA tenderness  Lungs:      respirations unlabored, bilateral crackles, decrease breath sound.       Heart:    Regular rate and rhythm, S1 and S2 normal, no murmur, rub   or gallop  Breast Exam:    S/P right mastectomy  Abdomen:     Soft, non-tender, bowel sounds active all four quadrants,    no masses, no organomegaly        Extremities:   Extremities normal, atraumatic, no cyanosis or edema  Pulses:   2+ and symmetric all extremities  Skin:   Skin color, texture, turgor normal, no rashes or lesions  Lymph nodes:   Cervical, supraclavicular,nodes normal, axillary nodule lef.   Neurologic:   CNII-XII intact, normal strength, sensation and reflexes    throughout    Labs on Admission:  Basic Metabolic Panel:  Lab 07/29/12 1610  NA 135  K 3.9  CL 99  CO2 27  GLUCOSE 124*  BUN 16  CREATININE 1.08  CALCIUM 9.3  MG --  PHOS --   Liver Function Tests:  Lab 07/29/12 0951  AST 26  ALT 16  ALKPHOS 64  BILITOT 0.3  PROT 7.5  ALBUMIN 3.1*   CBC:  Lab 07/29/12 0951  WBC 3.3*  NEUTROABS 2.3  HGB 8.7*  HCT 26.7*  MCV 85.3  PLT 204   Cardiac Enzymes:  Lab 07/29/12 0951  CKTOTAL --  CKMB --  CKMBINDEX --  TROPONINI <0.30    BNP (last 3 results)  Basename 07/29/12 0950  PROBNP 509.5*   CBG: No results found for this basename: GLUCAP:5 in the last 168 hours  Radiological Exams on Admission: Dg Chest 2 View  07/29/2012  *RADIOLOGY REPORT*  Clinical Data: Short of breath, chest pain  CHEST - 2 VIEW  Comparison: Most recent prior PET CT 04/03/2012  Findings: Interval development of a dense right basilar opacity with thickening versus fluid  in the elevated minor fissure.  The lateral view is limited by kyphotic patient positioning and body habitus.  This likely represents a combination of right-sided pleural effusion with right lower lobe and inferior right middle lobe atelectasis versus consolidation.  The left lung is clear. Unchanged cardiac and mediastinal contours.  Left subclavian approach portacatheter remains in unchanged position.  The catheter tip projects over the confluence of the left brachiocephalic vein and superior vena cava and is directed perpendicular into the lateral caval wall. Surgical clips the right axilla.  IMPRESSION:  1.  Interval development of right-sided  pleural effusion and dense right lower lobe and inferior right middle lobe opacity raising concern for pneumonia with parapneumonic effusion.  Given the patient's prior history of breast cancer, and underlying nodule or mass is difficult to exclude.  2.  Unchanged position of left subclavian approach portacatheter with the tip perpendicular to the superior vena cava.   Original Report Authenticated By: Malachy Moan, M.D.     EKG: Independently reviewed. Artifact.   Assessment/Plan Principal Problem:  *Healthcare-associated pneumonia Active Problems:  Essential hypertension, benign  Cardiomyopathy due to chemotherapy  Breast cancer, left  1-Pna: Will treat with broad spectrum antibiotics, patient on Chemotherapy. Will check legionella and strep in urine. Will do CT scan chest rule out malignancy when patient tolerate test. Vancomycin and Zosyn per pharmacy.  2-Dyspnea; in setting of PNA, pleural effusion, component Hearth Failure. Will treat for PNA and Hearth failure. Continue with antibiotics. Will add low dose lasix. Continue with spironolactone.  3-Cardiomyopathy: Treat for mild CHF, diastolic. Recent EF at 55. Continue with spironolactone and lasix, coreg.  4-Breast cancer: Will continue with aromasin. Will hold Afinitor due to acute infection. Will  discussed with Dr Donnie Coffin tomorrow.  5-Anemia: Post Chemo: baseline 9 to 10. I will add ferrous sulfate.    Code Status: Full Family Communication: Husband at bedside, care discussed with him.    Time spent: 60 minutes  Jonerik Sliker [facility] Pager [phone]  If 7PM-7AM, please contact night-coverage [email or web address] Password [email or web address] 07/29/2012, 1:40 PM

## 2012-07-30 ENCOUNTER — Inpatient Hospital Stay (HOSPITAL_COMMUNITY): Payer: PRIVATE HEALTH INSURANCE

## 2012-07-30 DIAGNOSIS — I10 Essential (primary) hypertension: Secondary | ICD-10-CM

## 2012-07-30 LAB — BASIC METABOLIC PANEL
Calcium: 9.1 mg/dL (ref 8.4–10.5)
Creatinine, Ser: 1.28 mg/dL — ABNORMAL HIGH (ref 0.50–1.10)
GFR calc non Af Amer: 42 mL/min — ABNORMAL LOW (ref >90)
Glucose, Bld: 115 mg/dL — ABNORMAL HIGH (ref 70–99)
Sodium: 137 mEq/L (ref 135–145)

## 2012-07-30 LAB — CBC
MCH: 27.7 pg (ref 26.0–34.0)
MCV: 84.7 fL (ref 78.0–100.0)
Platelets: 195 10*3/uL (ref 150–400)
RBC: 3.07 MIL/uL — ABNORMAL LOW (ref 3.87–5.11)
RDW: 13.3 % (ref 11.5–15.5)

## 2012-07-30 LAB — TROPONIN I
Troponin I: <0.3 ng/mL (ref <0.30)
Troponin I: <0.3 ng/mL (ref <0.30)

## 2012-07-30 MED ORDER — IOHEXOL 300 MG/ML  SOLN
80.0000 mL | Freq: Once | INTRAMUSCULAR | Status: AC | PRN
  Administered 2012-07-30: 80 mL via INTRAVENOUS

## 2012-07-30 MED ORDER — FUROSEMIDE 20 MG PO TABS
20.0000 mg | ORAL_TABLET | Freq: Every day | ORAL | Status: DC
  Administered 2012-07-30 – 2012-07-31 (×2): 20 mg via ORAL
  Filled 2012-07-30 (×2): qty 1

## 2012-07-30 NOTE — Progress Notes (Signed)
   CARE MANAGEMENT NOTE 07/30/2012  Patient:  Jeanette Morris, Jeanette Morris   Account Number:  [id number]  Date Initiated:  07/30/2012  Documentation initiated by:  Jiles Crocker  Subjective/Objective Assessment:   ADNITTED WITH PNEUMONIA     Action/Plan:   PCP: Egbert Garibaldi, NP  Specialists: Primary Oncologist: Dr Donnie Coffin.  Cardiologist Dr Gala Romney  LIVES AT HOME WITH SPOUSE; HAS A PERSONAL CARE SERVICE - [facility]   Anticipated DC Date:  08/06/2012   Anticipated DC Plan:  HOME W HOME HEALTH SERVICES      DC Planning Services  CM consult           Status of service:  In process, will continue to follow Medicare Important Message given?  NA - LOS <3 / Initial given by admissions (If response is "NO", the following Medicare IM given date fields will be blank)  Per UR Regulation:  Reviewed for med. necessity/level of care/duration of stay Comments:  07/30/2012- B Bradan Congrove RN,BSN,MHA

## 2012-07-30 NOTE — Progress Notes (Signed)
Pt bradycardic, with HR @33bpm  at 0215.  Huston Foley not sustained.  2.63second pause at 0216.  HR again returned to 70s.  Patient awakens easily, oriented to self/place/time.  BP 130/59, 94% on room air.  Mid-level Lynch aware.  Will continue to monitor, no new orders given.

## 2012-07-30 NOTE — Progress Notes (Signed)
[facility] PROGRESS NOTE  Jeanette Morris ZOX:[id number] DOB: 11/10/1945 DOA: 07/29/2012 PCP: Jeanette Garibaldi, NP  Assessment/Plan:  Principal Problem:  *Healthcare-associated pneumonia  Active Problems:  Essential hypertension, benign  Cardiomyopathy due to chemotherapy  Breast cancer, left   1- Bacterial HCAP : cont pip/tazo. CXR reviewed. Follow labs. CT chest pending. Urine strep pneumo ng. Legionella pending. Will do CT scan chest rule out malignancy when patient tolerate test.   2-Dyspnea; in setting of PNA, pleural effusion, component Hearth Failure. Will treat for PNA and Hearth failure. Continue with antibiotics.  Continue with spironolactone.   3-Cardiomyopathy: Treat for mild CHF, diastolic. Recent EF at 55. Continue with spironolactone and lasix, coreg.   4-Breast cancer: Will continue with aromasin. Will hold Afinitor due to acute infection. Will discussed with Dr Donnie Coffin tomorrow.   5-Anemia: Post Chemo: baseline 9 to 10. I will add ferrous sulfate. HB was 8.5 on 12/2  6-AKI : creat 1.28, d/c vanc and start IV linezolid. Monitor use of lasix.   Code Status: Full  Family Communication: Husband at bedside, care discussed with him.  Disposition : pending w/u   Consultants:  none  Procedures:  CT chest  Antibiotics: Vanc and pip/tazo since 12/01 Vanc d/c 12/02  HPI/Subjective: Pt c/o SOB. She feels better since she is here. No fever noted.    Objective: Filed Vitals:   07/29/12 2200 07/30/12 0227 07/30/12 0525 07/30/12 1450  BP: 138/53 130/59 166/73 142/82  Pulse: 84 81 90 75  Temp: 98.4 F (36.9 C) 98.4 F (36.9 C) 98.6 F (37 C) 98.5 F (36.9 C)  TempSrc: Oral Oral Oral Oral  Resp: 20 18 22 20   Height:      Weight:      SpO2: 97% 97% 95% 97%    Intake/Output Summary (Last 24 hours) at 07/30/12 1551 Last data filed at 07/30/12 1451  Gross per 24 hour  Intake    410 ml  Output   1100 ml  Net   -690 ml   Filed Weights   07/29/12  1320  Weight: 107.502 kg (237 lb)    Exam:   General:  A, O x3, NAD, lying in bed  Cardiovascular: S1S2 reg, no m/r/g  Respiratory: decreased AE on right side, no w/r/c  Abdomen: NT, ND BS+  Ext : trace edema  Data Reviewed: Basic Metabolic Panel:  Lab 07/30/12 8119 07/29/12 0951  NA 137 135  K 3.7 3.9  CL 99 99  CO2 29 27  GLUCOSE 115* 124*  BUN 15 16  CREATININE 1.28* 1.08  CALCIUM 9.1 9.3  MG -- 1.9  PHOS -- --   Liver Function Tests:  Lab 07/29/12 0951  AST 26  ALT 16  ALKPHOS 64  BILITOT 0.3  PROT 7.5  ALBUMIN 3.1*   No results found for this basename: LIPASE:5,AMYLASE:5 in the last 168 hours No results found for this basename: AMMONIA:5 in the last 168 hours CBC:  Lab 07/30/12 0640 07/29/12 0951  WBC 2.6* 3.3*  NEUTROABS -- 2.3  HGB 8.5* 8.7*  HCT 26.0* 26.7*  MCV 84.7 85.3  PLT 195 204   Cardiac Enzymes:  Lab 07/30/12 1358 07/30/12 0640 07/29/12 1754 07/29/12 0951  CKTOTAL -- -- -- --  CKMB -- -- -- --  CKMBINDEX -- -- -- --  TROPONINI <0.30 <0.30 <0.30 <0.30   BNP (last 3 results)  Basename 07/29/12 0950  PROBNP 509.5*   CBG: No results found for this basename: GLUCAP:5 in the last  168 hours  No results found for this or any previous visit (from the past 240 hour(s)).   Studies: Dg Chest 2 View  07/29/2012  *RADIOLOGY REPORT*  Clinical Data: Short of breath, chest pain  CHEST - 2 VIEW  Comparison: Most recent prior PET CT 04/03/2012  Findings: Interval development of a dense right basilar opacity with thickening versus fluid in the elevated minor fissure.  The lateral view is limited by kyphotic patient positioning and body habitus.  This likely represents a combination of right-sided pleural effusion with right lower lobe and inferior right middle lobe atelectasis versus consolidation.  The left lung is clear. Unchanged cardiac and mediastinal contours.  Left subclavian approach portacatheter remains in unchanged position.  The  catheter tip projects over the confluence of the left brachiocephalic vein and superior vena cava and is directed perpendicular into the lateral caval wall. Surgical clips the right axilla.  IMPRESSION:  1.  Interval development of right-sided pleural effusion and dense right lower lobe and inferior right middle lobe opacity raising concern for pneumonia with parapneumonic effusion.  Given the patient's prior history of breast cancer, and underlying nodule or mass is difficult to exclude.  2.  Unchanged position of left subclavian approach portacatheter with the tip perpendicular to the superior vena cava.   Original Report Authenticated By: Malachy Moan, M.D.     Scheduled Meds:   . aspirin  81 mg Oral Daily  . calcium-vitamin D  1 tablet Oral Daily  . carvedilol  12.5 mg Oral q morning - 10a  . carvedilol  6.25 mg Oral q morning - 10a  . docusate sodium  100 mg Oral BID  . exemestane  25 mg Oral QPC breakfast  . ferrous sulfate  325 mg Oral BID WC  . furosemide  20 mg Intravenous Daily  . gabapentin  300 mg Oral QHS  . heparin  5,000 Units Subcutaneous Q8H  . piperacillin-tazobactam (ZOSYN)  IV  3.375 g Intravenous Q8H  . sodium chloride  3 mL Intravenous Q12H  . spironolactone  25 mg Oral Daily  . vancomycin  1,000 mg Intravenous Q12H   Continuous Infusions:   Principal Problem:  *Healthcare-associated pneumonia Active Problems:  Essential hypertension, benign  Cardiomyopathy due to chemotherapy  Breast cancer, left    Time spent: 35 minutes    Keaundre Thelin V.  [facility] Pager [phone]. If 8PM-8AM, please contact night-coverage at [email or web address], password [facility] 07/30/2012, 3:51 PM  LOS: 1 day

## 2012-07-31 DIAGNOSIS — J90 Pleural effusion, not elsewhere classified: Secondary | ICD-10-CM | POA: Diagnosis present

## 2012-07-31 DIAGNOSIS — N179 Acute kidney failure, unspecified: Secondary | ICD-10-CM | POA: Diagnosis not present

## 2012-07-31 LAB — CBC WITH DIFFERENTIAL/PLATELET
Basophils Absolute: 0 10*3/uL (ref 0.0–0.1)
Basophils Relative: 0 % (ref 0–1)
Eosinophils Absolute: 0 10*3/uL (ref 0.0–0.7)
Eosinophils Relative: 1 % (ref 0–5)
HCT: 24.5 % — ABNORMAL LOW (ref 36.0–46.0)
MCH: 27.5 pg (ref 26.0–34.0)
MCHC: 31.8 g/dL (ref 30.0–36.0)
MCV: 86.3 fL (ref 78.0–100.0)
Monocytes Absolute: 0.4 10*3/uL (ref 0.1–1.0)
Monocytes Relative: 15 % — ABNORMAL HIGH (ref 3–12)
Neutro Abs: 1.6 10*3/uL — ABNORMAL LOW (ref 1.7–7.7)
RDW: 13.6 % (ref 11.5–15.5)

## 2012-07-31 LAB — TROPONIN I
Troponin I: <0.3 ng/mL (ref <0.30)
Troponin I: <0.3 ng/mL (ref <0.30)
Troponin I: <0.3 ng/mL (ref <0.30)

## 2012-07-31 LAB — LEGIONELLA ANTIGEN, URINE: Legionella Antigen, Urine: NEGATIVE

## 2012-07-31 LAB — COMPREHENSIVE METABOLIC PANEL
AST: 20 U/L (ref 0–37)
Albumin: 2.6 g/dL — ABNORMAL LOW (ref 3.5–5.2)
BUN: 15 mg/dL (ref 6–23)
Calcium: 8.1 mg/dL — ABNORMAL LOW (ref 8.4–10.5)
Creatinine, Ser: 1.43 mg/dL — ABNORMAL HIGH (ref 0.50–1.10)
Total Protein: 6.2 g/dL (ref 6.0–8.3)

## 2012-07-31 MED ORDER — VITAMINS A & D EX OINT
TOPICAL_OINTMENT | CUTANEOUS | Status: AC
  Administered 2012-07-31: 14:00:00
  Filled 2012-07-31: qty 5

## 2012-07-31 NOTE — Progress Notes (Signed)
[facility] PROGRESS NOTE  Jeanette Morris ZOX:[id number] DOB: Feb 16, 1946 DOA: 07/29/2012 PCP: Egbert Garibaldi, NP  Assessment/Plan: Principal Problem:  *Healthcare-associated pneumonia   Active Problems:  Essential hypertension, benign  Cardiomyopathy due to chemotherapy  Breast cancer, left   1- Bacterial HCAP : cont pip/tazo. CT with right sided middle and lower lobe consolidation which is new. Cont IV abx next day or two. Then she may be able to go home with oxygen.  Urine strep pneumo ng. Legionella pending. Large pleural effusion is parapneumonic. Consider drainage.   2-Dyspnea; in setting of PNA, pleural effusion, component Hearth Failure. Will treat for PNA and Hearth failure. Continue with antibiotics. Continue with spironolactone.   3-Cardiomyopathy: Treat for mild CHF, diastolic. Recent EF at 55. Continue with spironolactone and lasix, coreg.   4-Breast cancer: Will continue with aromasin. Will hold Afinitor due to acute infection. Will consider d/w with Dr Donnie Coffin prior to discharge.    5-Anemia: Post Chemo: baseline 9 to 10. On ferrous sulfate. HB was 7.8 on 12/2. Consider PRBC if HB < 7.   6-AKI : creat 1.28 to 1.48, consider holding lasix today.   Code Status: Full  Family Communication: Husband at bedside, care discussed with him.  Disposition : pending w/u  Consultants:  none Procedures:  CT chest Antibiotics:  Vanc and pip/tazo since 12/01  Vanc d/c 12/02   HPI/Subjective: Pt says her SOB is better since she is wearing oxygen. She was SOB when she went for CT yesterday. Denies any CP, cough or sputum production. Appetite is not good.   Objective: Filed Vitals:   07/30/12 2152 07/31/12 0430 07/31/12 0603 07/31/12 1018  BP: 135/71  139/65 126/83  Pulse: 86  93 85  Temp: 98.5 F (36.9 C)  98.4 F (36.9 C)   TempSrc: Oral  Oral   Resp: 20  20   Height:      Weight:  105.8 kg (233 lb 4 oz)    SpO2: 100%  100%     Intake/Output Summary (Last  24 hours) at 07/31/12 1406 Last data filed at 07/31/12 0900  Gross per 24 hour  Intake    660 ml  Output    800 ml  Net   -140 ml   Filed Weights   07/29/12 1320 07/31/12 0430  Weight: 107.502 kg (237 lb) 105.8 kg (233 lb 4 oz)    Exam: General: A, O x3, NAD, sitting in chair Cardiovascular: S1S2 reg, no m/r/g  Respiratory: decreased AE on right side, no w/r/c  Abdomen: NT, ND BS+  Ext : trace edema   Data Reviewed: Basic Metabolic Panel:  Lab 07/31/12 8119 07/30/12 0640 07/29/12 0951  NA 137 137 135  K 3.5 3.7 3.9  CL 102 99 99  CO2 27 29 27   GLUCOSE 99 115* 124*  BUN 15 15 16   CREATININE 1.43* 1.28* 1.08  CALCIUM 8.1* 9.1 9.3  MG -- -- 1.9  PHOS -- -- --   Liver Function Tests:  Lab 07/31/12 0540 07/29/12 0951  AST 20 26  ALT 11 16  ALKPHOS 50 64  BILITOT 0.3 0.3  PROT 6.2 7.5  ALBUMIN 2.6* 3.1*   No results found for this basename: LIPASE:5,AMYLASE:5 in the last 168 hours No results found for this basename: AMMONIA:5 in the last 168 hours CBC:  Lab 07/31/12 0540 07/30/12 0640 07/29/12 0951  WBC 2.7* 2.6* 3.3*  NEUTROABS 1.6* -- 2.3  HGB 7.8* 8.5* 8.7*  HCT 24.5* 26.0* 26.7*  MCV  86.3 84.7 85.3  PLT 185 195 204   Cardiac Enzymes:  Lab 07/31/12 0540 07/30/12 2139 07/30/12 1358 07/30/12 0640 07/29/12 1754  CKTOTAL -- -- -- -- --  CKMB -- -- -- -- --  CKMBINDEX -- -- -- -- --  TROPONINI <0.30 <0.30 <0.30 <0.30 <0.30   BNP (last 3 results)  Basename 07/29/12 0950  PROBNP 509.5*   CBG: No results found for this basename: GLUCAP:5 in the last 168 hours  No results found for this or any previous visit (from the past 240 hour(s)).   Studies: Ct Chest W Contrast  07/30/2012  *RADIOLOGY REPORT*  Clinical Data: Chest pain and shortness of breath  CT CHEST WITH CONTRAST  Technique:  Multidetector CT imaging of the chest was performed following the standard protocol during bolus administration of intravenous contrast.  Contrast: 80mL OMNIPAQUE IOHEXOL  300 MG/ML  SOLN  Comparison: 04/03/2012  Findings: Lungs/pleura: There is a large right pleural effusion. Small left pleural effusion is present.  Compressive type atelectasis and consolidation of the right lower lobe and right middle lobe is identified.  Left lung is clear.  Heart/Mediastinum: There is moderate cardiac enlargement.  Very small pericardial effusion is noted.  Calcifications involving the left main, LAD coronary arteries are noted.  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 No mediastinal or hilar adenopathy identified.  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  Asymmetric dilatation of the right renal collecting system is identified, image number 65.  This is of uncertain clinical significance/etiology.  Bones/Musculoskeletal:    The patient is status post previous right mastectomy.  There is abnormal skin thickening overlying the left breast which is new from previous exam.  Multiple enlarged and enhancing left axillary lymph nodes are identified and appears similar to previous exam.  These are worrisome for metastatic adenopathy. Review of the visualized osseous structures is significant for multilevel thoracic spondylosis.No aggressive lytic or sclerotic bone lesions identified.  IMPRESSION:  1.  Interval development of large right pleural effusion with associated compressive type atelectasis and consolidation of the right middle lobe and right lower lobe.  2.  Small left effusion.  3.  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  4.  Skin thickening overlying the left breast may be treatment related.  5.  Porcelain gallbladder.   Original Report Authenticated By: Signa Kell, M.D.     Scheduled Meds:   . aspirin  81 mg Oral Daily  . calcium-vitamin D  1 tablet Oral Daily  . carvedilol  12.5 mg Oral q morning - 10a  . carvedilol  6.25 mg Oral q morning - 10a  . docusate sodium  100 mg  Oral BID  . exemestane  25 mg Oral QPC breakfast  . ferrous sulfate  325 mg Oral BID WC  . furosemide  20 mg Oral Daily  . gabapentin  300 mg Oral QHS  . heparin  5,000 Units Subcutaneous Q8H  . piperacillin-tazobactam (ZOSYN)  IV  3.375 g Intravenous Q8H  . sodium chloride  3 mL Intravenous Q12H  . spironolactone  25 mg Oral Daily  . vitamin A & D      . [DISCONTINUED] furosemide  20 mg Intravenous Daily  . [DISCONTINUED] vancomycin  1,000 mg Intravenous Q12H   Continuous Infusions:   Principal Problem:  *Healthcare-associated pneumonia Active Problems:  Essential hypertension, benign  Breast cancer, left  Cardiomyopathy due to chemotherapy  Time spent: 30 min  Carlos Quackenbush V.  [facility] Pager [phone]. If 8PM-8AM, please contact night-coverage at [email or web address], password [facility] 07/31/2012, 2:06 PM  LOS: 2 days

## 2012-08-01 ENCOUNTER — Inpatient Hospital Stay (HOSPITAL_COMMUNITY): Payer: PRIVATE HEALTH INSURANCE

## 2012-08-01 DIAGNOSIS — N179 Acute kidney failure, unspecified: Secondary | ICD-10-CM

## 2012-08-01 LAB — COMPREHENSIVE METABOLIC PANEL
ALT: 15 U/L (ref 0–35)
Albumin: 2.9 g/dL — ABNORMAL LOW (ref 3.5–5.2)
Alkaline Phosphatase: 54 U/L (ref 39–117)
BUN: 16 mg/dL (ref 6–23)
Chloride: 100 mEq/L (ref 96–112)
Potassium: 3.6 mEq/L (ref 3.5–5.1)
Sodium: 138 mEq/L (ref 135–145)
Total Bilirubin: 0.3 mg/dL (ref 0.3–1.2)
Total Protein: 6.8 g/dL (ref 6.0–8.3)

## 2012-08-01 LAB — CBC WITH DIFFERENTIAL/PLATELET
Basophils Relative: 0 % (ref 0–1)
Eosinophils Absolute: 0.1 10*3/uL (ref 0.0–0.7)
Hemoglobin: 8.3 g/dL — ABNORMAL LOW (ref 12.0–15.0)
Lymphs Abs: 0.5 10*3/uL — ABNORMAL LOW (ref 0.7–4.0)
MCH: 27.3 pg (ref 26.0–34.0)
MCHC: 31.6 g/dL (ref 30.0–36.0)
Monocytes Relative: 14 % — ABNORMAL HIGH (ref 3–12)
Neutro Abs: 1.9 10*3/uL (ref 1.7–7.7)
Neutrophils Relative %: 66 % (ref 43–77)
Platelets: 211 10*3/uL (ref 150–400)
RBC: 3.04 MIL/uL — ABNORMAL LOW (ref 3.87–5.11)

## 2012-08-01 LAB — LACTATE DEHYDROGENASE, PLEURAL OR PERITONEAL FLUID: LD, Fluid: 246 U/L — ABNORMAL HIGH (ref 3–23)

## 2012-08-01 LAB — BODY FLUID CELL COUNT WITH DIFFERENTIAL
Eos, Fluid: 0 %
Lymphs, Fluid: 55 %
Monocyte-Macrophage-Serous Fluid: 40 % — ABNORMAL LOW (ref 50–90)
Neutrophil Count, Fluid: 5 % (ref 0–25)
Total Nucleated Cell Count, Fluid: 3287 cu mm — ABNORMAL HIGH (ref 0–1000)

## 2012-08-01 LAB — PH, BODY FLUID: pH, Fluid: 7.5

## 2012-08-01 LAB — TROPONIN I: Troponin I: <0.3 ng/mL (ref <0.30)

## 2012-08-01 LAB — PROTEIN, BODY FLUID: Total protein, fluid: 4.4 g/dL

## 2012-08-01 LAB — GLUCOSE, SEROUS FLUID: Glucose, Fluid: 123 mg/dL

## 2012-08-01 MED ORDER — SODIUM CHLORIDE 0.9 % IJ SOLN: 10.0000 mL | INTRAMUSCULAR | Status: DC | PRN

## 2012-08-01 NOTE — Procedures (Signed)
Successful US guided right thoracentesis. Yielded 1.6L of clear yellow fluid. Pt tolerated procedure well. No immediate complications.  Specimen was sent for labs. CXR ordered.  Brayton El PA-C 08/01/2012 12:37 PM

## 2012-08-01 NOTE — Progress Notes (Addendum)
[facility] PROGRESS NOTE  Jeanette Morris ZOX:[id number] DOB: [birth year] DOA: 07/29/2012 PCP: Egbert Garibaldi, NP  Assessment/Plan: Principal Problem:  *Healthcare-associated pneumonia   Active Problems:  Essential hypertension, benign  Cardiomyopathy due to chemotherapy  Breast cancer, left   1- Bacterial HCAP with right milddle and lower lobe consolidation with moderate right pleural effusion:  -  Cont pip/tazo, day 4 -  Thoracentesis today drained 1.6L of clear yellow fluid without complication -  Protein ratio suggests exudative effusion and WBC are elevated -  Culture pending -  Cytology pending  2-Dyspnea; in setting of PNA, pleural effusion, possible component of heart Hearth Failure.  -  Continue abx -  S/p thoracentesis -  D/c spironoloctone  3-Cardiomyopathy: Treat for mild CHF, diastolic. Recent EF at 55. Continue with spironolactone and lasix, coreg.   4-Breast cancer: Will continue with aromasin. Will hold Afinitor due to acute infection.  - Will consider d/w with Dr Donnie Coffin prior to discharge regarding afinitor  5-Anemia: Post Chemo: baseline 9 to 10. On ferrous sulfate. HB was 7.8 on 12/2.  -  Consider PRBC if HB < 7.   6-AKI : creatinine rising -  Hold spironolactone  7.  Diarrhea, patient states his happens at home also sometimes and she take imodium -  Check C. Diff -  If negative, consider imodium  Code Status: Full  Family Communication: Patient alone at bedside  Disposition:  Likely to home in the next few days, possibly with home oxygen.    Consultants:  radiology Procedures:  CT chest Antibiotics:  Vanc and pip/tazo since 12/01  Vanc d/c 12/02   HPI/Subjective: Breathing is marginally improved from yesterday.  She now is coughing and has some sputum production.  Decreased appetite still.  Denies constipation, but is having frequent soft stools and occasional liquid stools.  Denies abdominal pain.    Objective: Filed Vitals:    07/31/12 1018 07/31/12 1430 07/31/12 2114 08/01/12 0516  BP: 126/83 115/59 151/67 140/72  Pulse: 85 82 87 93  Temp:  98.6 F (37 C) 98.7 F (37.1 C) 98.3 F (36.8 C)  TempSrc:  Oral Oral Oral  Resp:  20 20 20   Height:      Weight:    104.962 kg (231 lb 6.4 oz)  SpO2:  96% 100% 100%    Intake/Output Summary (Last 24 hours) at 08/01/12 0719 Last data filed at 07/31/12 1700  Gross per 24 hour  Intake    410 ml  Output      0 ml  Net    410 ml   Filed Weights   07/29/12 1320 07/31/12 0430 08/01/12 0516  Weight: 107.502 kg (237 lb) 105.8 kg (233 lb 4 oz) 104.962 kg (231 lb 6.4 oz)    Exam: General: A&O x4, NAD, sitting on bedside commode Cardiovascular: RRR S1S2 reg, no m/r/g  Respiratory: Very diminished breath sounds on the right side, rales at the left base.  No wheezes or rhonchi.  No increased work of breathing on nasal canula  Abdomen: NT, ND BS+  Ext : trace edema   Data Reviewed: Basic Metabolic Panel:  Lab 08/01/12 8119 07/31/12 0540 07/30/12 0640 07/29/12 0951  NA 138 137 137 135  K 3.6 3.5 3.7 3.9  CL 100 102 99 99  CO2 30 27 29 27   GLUCOSE 97 99 115* 124*  BUN 16 15 15 16   CREATININE 1.51* 1.43* 1.28* 1.08  CALCIUM 9.3 8.1* 9.1 9.3  MG -- -- -- 1.9  PHOS -- -- -- --   Liver Function Tests:  Lab 08/01/12 0520 07/31/12 0540 07/29/12 0951  AST 27 20 26   ALT 15 11 16   ALKPHOS 54 50 64  BILITOT 0.3 0.3 0.3  PROT 6.8 6.2 7.5  ALBUMIN 2.9* 2.6* 3.1*   No results found for this basename: LIPASE:5,AMYLASE:5 in the last 168 hours No results found for this basename: AMMONIA:5 in the last 168 hours CBC:  Lab 08/01/12 0520 07/31/12 0540 07/30/12 0640 07/29/12 0951  WBC 2.8* 2.7* 2.6* 3.3*  NEUTROABS 1.9 1.6* -- 2.3  HGB 8.3* 7.8* 8.5* 8.7*  HCT 26.3* 24.5* 26.0* 26.7*  MCV 86.5 86.3 84.7 85.3  PLT 211 185 195 204   Cardiac Enzymes:  Lab 08/01/12 0520 07/31/12 2204 07/31/12 1355 07/31/12 0540 07/30/12 2139  CKTOTAL -- -- -- -- --  CKMB -- -- -- --  --  CKMBINDEX -- -- -- -- --  TROPONINI <0.30 <0.30 <0.30 <0.30 <0.30   BNP (last 3 results)  Basename 07/29/12 0950  PROBNP 509.5*   CBG: No results found for this basename: GLUCAP:5 in the last 168 hours  No results found for this or any previous visit (from the past 240 hour(s)).   Studies: Ct Chest W Contrast  07/30/2012  *RADIOLOGY REPORT*  Clinical Data: Chest pain and shortness of breath  CT CHEST WITH CONTRAST  Technique:  Multidetector CT imaging of the chest was performed following the standard protocol during bolus administration of intravenous contrast.  Contrast: 80mL OMNIPAQUE IOHEXOL 300 MG/ML  SOLN  Comparison: 04/03/2012  Findings: Lungs/pleura: There is a large right pleural effusion. Small left pleural effusion is present.  Compressive type atelectasis and consolidation of the right lower lobe and right middle lobe is identified.  Left lung is clear.  Heart/Mediastinum: There is moderate cardiac enlargement.  Very small pericardial effusion is noted.  Calcifications involving the left main, LAD coronary arteries are noted.  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 No mediastinal or hilar adenopathy identified.  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  Asymmetric dilatation of the right renal collecting system is identified, image number 65.  This is of uncertain clinical significance/etiology.  Bones/Musculoskeletal:    The patient is status post previous right mastectomy.  There is abnormal skin thickening overlying the left breast which is new from previous exam.  Multiple enlarged and enhancing left axillary lymph nodes are identified and appears similar to previous exam.  These are worrisome for metastatic adenopathy. Review of the visualized osseous structures is significant for multilevel thoracic spondylosis.No aggressive lytic or sclerotic bone lesions identified.  IMPRESSION:  1.  Interval development  of large right pleural effusion with associated compressive type atelectasis and consolidation of the right middle lobe and right lower lobe.  2.  Small left effusion.  3.  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  4.  Skin thickening overlying the left breast may be treatment related.  5.  Porcelain gallbladder.   Original Report Authenticated By: Signa Kell, M.D.     Scheduled Meds:    . aspirin  81 mg Oral Daily  . calcium-vitamin D  1 tablet Oral Daily  . carvedilol  12.5 mg Oral q morning - 10a  . carvedilol  6.25 mg Oral q morning - 10a  . docusate sodium  100 mg Oral BID  . exemestane  25 mg Oral QPC breakfast  . ferrous sulfate  325 mg Oral BID WC  .  gabapentin  300 mg Oral QHS  . heparin  5,000 Units Subcutaneous Q8H  . piperacillin-tazobactam (ZOSYN)  IV  3.375 g Intravenous Q8H  . sodium chloride  3 mL Intravenous Q12H  . spironolactone  25 mg Oral Daily  . [COMPLETED] vitamin A & D      . [DISCONTINUED] furosemide  20 mg Oral Daily   Continuous Infusions:   Principal Problem:  *Healthcare-associated pneumonia Active Problems:  Essential hypertension, benign  Cardiomyopathy due to chemotherapy  Breast cancer, left  Pleural effusion on right  AKI (acute kidney injury)  Time spent: 30 min  Moneka Mcquinn, [facility]  [facility] Pager [phone]. If 7PM-7AM, please contact night-coverage at [email or web address], password [facility] 08/01/2012, 7:19 AM  LOS: 3 days

## 2012-08-01 NOTE — Progress Notes (Signed)
ANTIBIOTIC CONSULT NOTE - Follow Up  Pharmacy Consult for  Zosyn Indication: PNA  No Known Allergies  Patient Measurements: Height: 5\' 5"  (165.1 cm) Weight: 231 lb 6.4 oz (104.962 kg) IBW/kg (Calculated) : 57   Vital Signs: Temp: 98.3 F (36.8 C) (12/04 0516) Temp src: Oral (12/04 0516) BP: 140/72 mmHg (12/04 0516) Pulse Rate: 93  (12/04 0516) Intake/Output from previous day: 12/03 0701 - 12/04 0700 In: 460 [P.O.:360; IV Piggyback:100] Out: -  Intake/Output from this shift: Total I/O In: 120 [P.O.:120] Out: -   Labs:  Basename 08/01/12 0520 07/31/12 0540 07/30/12 0640  WBC 2.8* 2.7* 2.6*  HGB 8.3* 7.8* 8.5*  PLT 211 185 195  LABCREA -- -- --  CREATININE 1.51* 1.43* 1.28*   Estimated Creatinine Clearance: 42.9 ml/min (by C-G formula based on Cr of 1.51).    Microbiology: No results found for this or any previous visit (from the past 720 hour(s)).  Me Medications:  Prescriptions prior to admission  Medication Sig Dispense Refill  . aspirin 81 MG chewable tablet Chew 81 mg by mouth daily.      . Calcium Carbonate-Vitamin D 600-400 MG-UNIT per tablet Take 1 tablet by mouth every morning.       . carvedilol (COREG) 12.5 MG tablet Take 12.5 mg by mouth every morning. Take With 6.25 mg tablet to make a total strength of 18.75mg       . carvedilol (COREG) 6.25 MG tablet Take 6.25 mg by mouth every morning. Take With 12.5 mg tablet to make a total strength of 18.75mg       . everolimus (AFINITOR) 5 MG tablet Take 5 mg by mouth daily after breakfast.       . exemestane (AROMASIN) 25 MG tablet Take 25 mg by mouth daily after breakfast.      . gabapentin (NEURONTIN) 300 MG capsule Take 300 mg by mouth at bedtime.        Marland Kitchen lisinopril (PRINIVIL,ZESTRIL) 40 MG tablet Take 40 mg by mouth every morning.      Marland Kitchen oxyCODONE-acetaminophen (PERCOCET/ROXICET) 5-325 MG per tablet Take 1-2 tablets by mouth every 6 (six) hours as needed. For pain      . spironolactone (ALDACTONE) 25 MG  tablet Take 1 tablet (25 mg total) by mouth daily.  30 tablet  3   Anti-infectives     Start     Dose/Rate Route Frequency Ordered Stop   07/29/12 2200   vancomycin (VANCOCIN) IVPB 1000 mg/200 mL premix  Status:  Discontinued        1,000 mg 200 mL/hr over 60 Minutes Intravenous Every 12 hours 07/29/12 1412 07/30/12 1600   07/29/12 2000   piperacillin-tazobactam (ZOSYN) IVPB 3.375 g        3.375 g 12.5 mL/hr over 240 Minutes Intravenous Every 8 hours 07/29/12 1412     07/29/12 1145   piperacillin-tazobactam (ZOSYN) IVPB 3.375 g        3.375 g 12.5 mL/hr over 240 Minutes Intravenous  Once 07/29/12 1138 07/29/12 1545   07/29/12 1145   vancomycin (VANCOCIN) IVPB 1000 mg/200 mL premix        1,000 mg 200 mL/hr over 60 Minutes Intravenous  Once 07/29/12 1138 07/29/12 1435   07/29/12 1145   azithromycin (ZITHROMAX) 500 mg in dextrose 5 % 250 mL IVPB        500 mg 250 mL/hr over 60 Minutes Intravenous  Once 07/29/12 1138 07/29/12 1258         Assessment:  66  yo F admitted 12/1 with CP and SOB. Hx breast Ca on oral chemotherapy.  Today is Day #4 Zosyn EI.  SCr continues to trend up, CrCl ~ 43 ml/min - extended-interval Zosyn dosing remains appropriate  Plan:   Continue Zosyn 3.375g IV Q8H infused over 4hrs.  Darrol Angel, PharmD Pager: [phone] 08/01/2012 11:13 AM

## 2012-08-02 DIAGNOSIS — J910 Malignant pleural effusion: Secondary | ICD-10-CM

## 2012-08-02 NOTE — Progress Notes (Signed)
[facility] PROGRESS NOTE  Jeanette Morris:[id number] DOB: [birth year] DOA: 07/29/2012 PCP: Egbert Garibaldi, NP  Assessment/Plan: Principal Problem:  *Healthcare-associated pneumonia   Active Problems:  Essential hypertension, benign  Cardiomyopathy due to chemotherapy  Breast cancer, left   1- Bacterial HCAP with right milddle and lower lobe consolidation with moderate right pleural effusion s/p 1.6L thoracentesis on 12/4.  Patient clinically improved but has loculation of the right lung.   -  Pip/tazo, day 5 -  Change to moxi at discharge -  Culture NGTD -  Cytology pending -  Appreciate CT surgery recommendations.  Patient to follow up in clinic next week  2-Dyspnea; in setting of PNA, pleural effusion, possible component of heart Hearth Failure. Improved after thoracentesis.    3-Cardiomyopathy: Treat for mild CHF, diastolic. Recent EF at 55. Continue with spironolactone and lasix, coreg.   4-Breast cancer: Will continue with aromasin. Will hold Afinitor due to acute infection.  - Will consider d/w with Dr Donnie Coffin prior to discharge regarding afinitor  5-Anemia: Post Chemo: baseline 9 to 10. On ferrous sulfate. HB was 7.8 on 12/2. Consider PRBC if HB < 7.  6-AKI : creatinine rising.  Recheck creatinine in AM 7.  Diarrhea, patient states his happens at home also sometimes and she take imodium. C. Diff neg.  Start imodium  Code Status: Full  Family Communication: Patient alone at bedside  Disposition:  Likely to home tomorrow   Consultants:  radiology Procedures:  CT chest Antibiotics:  Vanc and pip/tazo since 12/01  Vanc d/c 12/02   HPI/Subjective: Breathing is much better today after thoracentesis.  Decreased appetite still.  Denies constipation, but is having frequent soft stools and occasional liquid stools.  Denies abdominal pain.    Objective: Filed Vitals:   08/01/12 2202 08/02/12 0500 08/02/12 0531 08/02/12 1453  BP: 129/54  137/59 127/78   Pulse: 85  97 92  Temp: 99.3 F (37.4 C)  98.6 F (37 C) 98.5 F (36.9 C)  TempSrc: Oral  Oral Oral  Resp: 20  18 18   Height:      Weight:  103.6 kg (228 lb 6.3 oz)    SpO2: 98%  96% 96%    Intake/Output Summary (Last 24 hours) at 08/02/12 1945 Last data filed at 08/02/12 1900  Gross per 24 hour  Intake    720 ml  Output      0 ml  Net    720 ml   Filed Weights   07/31/12 0430 08/01/12 0516 08/02/12 0500  Weight: 105.8 kg (233 lb 4 oz) 104.962 kg (231 lb 6.4 oz) 103.6 kg (228 lb 6.3 oz)    Exam: General: A&O x4, NAD, sitting in bed Cardiovascular: RRR S1S2 reg, no m/r/g  Respiratory:  Mildly diminished at the right base with course rales.  Also some rales at the left base.  No wheezes or rhonchi. Abdomen: NT, ND BS+  Ext : No edema  Data Reviewed: Basic Metabolic Panel:  Lab 08/01/12 8119 07/31/12 0540 07/30/12 0640 07/29/12 0951  NA 138 137 137 135  K 3.6 3.5 3.7 3.9  CL 100 102 99 99  CO2 30 27 29 27   GLUCOSE 97 99 115* 124*  BUN 16 15 15 16   CREATININE 1.51* 1.43* 1.28* 1.08  CALCIUM 9.3 8.1* 9.1 9.3  MG -- -- -- 1.9  PHOS -- -- -- --   Liver Function Tests:  Lab 08/01/12 0520 07/31/12 0540 07/29/12 0951  AST 27 20 26   ALT  15 11 16   ALKPHOS 54 50 64  BILITOT 0.3 0.3 0.3  PROT 6.8 6.2 7.5  ALBUMIN 2.9* 2.6* 3.1*   No results found for this basename: LIPASE:5,AMYLASE:5 in the last 168 hours No results found for this basename: AMMONIA:5 in the last 168 hours CBC:  Lab 08/01/12 0520 07/31/12 0540 07/30/12 0640 07/29/12 0951  WBC 2.8* 2.7* 2.6* 3.3*  NEUTROABS 1.9 1.6* -- 2.3  HGB 8.3* 7.8* 8.5* 8.7*  HCT 26.3* 24.5* 26.0* 26.7*  MCV 86.5 86.3 84.7 85.3  PLT 211 185 195 204   Cardiac Enzymes:  Lab 08/01/12 0520 07/31/12 2204 07/31/12 1355 07/31/12 0540 07/30/12 2139  CKTOTAL -- -- -- -- --  CKMB -- -- -- -- --  CKMBINDEX -- -- -- -- --  TROPONINI <0.30 <0.30 <0.30 <0.30 <0.30   BNP (last 3 results)  Basename 07/29/12 0950  PROBNP 509.5*    CBG: No results found for this basename: GLUCAP:5 in the last 168 hours  Recent Results (from the past 240 hour(s))  BODY FLUID CULTURE     Status: Normal (Preliminary result)   Collection Time   08/01/12 12:17 PM      Component Value Range Status Comment   Specimen Description PLEURAL   Final    Special Requests Normal   Final    Gram Stain     Final    Value: RARE WBC PRESENT,BOTH PMN AND MONONUCLEAR     NO ORGANISMS SEEN   Culture NO GROWTH 1 DAY   Final    Report Status PENDING   Incomplete   CLOSTRIDIUM DIFFICILE BY PCR     Status: Normal   Collection Time   08/02/12  6:00 AM      Component Value Range Status Comment   C difficile by pcr NEGATIVE  NEGATIVE Final      Studies: Dg Chest 1 View  08/01/2012  *RADIOLOGY REPORT*  Clinical Data: Post right-sided thoracentesis  CHEST - 1 VIEW  Comparison: 07/29/2012; 06/05/2010; chest CT - 07/30/2012  Findings:  Grossly unchanged enlarged cardiac silhouette and mediastinal contours.  Stable positioning of support apparatus.  Interval reduction in now small residual likely partially loculated right- sided pleural effusion post thoracentesis.  No definite pneumothorax.  Small left-sided pleural effusion is unchanged. Persistent bilateral mid and lower lung heterogeneous opacities are unchanged.  No new focal airspace opacities.  Surgical clips overlie the right axilla.  Unchanged bones.  IMPRESSION: 1.  Interval reduction in now small residual likely partially loculated right-sided pleural effusion post thoracentesis.  No pneumothorax.  2.  Unchanged small left-sided pleural effusion. 3.  Grossly unchanged bilateral mid and lower lung heterogeneous opacities, right greater than left, atelectasis versus infiltrate.   Original Report Authenticated By: Tacey Ruiz, MD    US Thoracentesis Asp Pleural Space W/img Guide  08/01/2012  *RADIOLOGY REPORT*  Clinical Data:  Shortness of breath, right-sided pleural effusion  ULTRASOUND GUIDED right  THORACENTESIS  Comparison:  None  An ultrasound guided thoracentesis was thoroughly discussed with the patient and questions answered.  The benefits, risks, alternatives and complications were also discussed.  The patient understands and wishes to proceed with the procedure.  Written consent was obtained.  Ultrasound was performed to localize and mark an adequate pocket of fluid in the right chest.  The area was then prepped and draped in the normal sterile fashion.  1% Lidocaine was used for local anesthesia.  Under ultrasound guidance a 19 gauge Yueh catheter was introduced.  Thoracentesis  was performed.  The catheter was removed and a dressing applied.  Complications:  None immediate  Findings: A total of approximately 1.7 liters of clear yellow fluid was removed. A fluid sample was sent for laboratory analysis.  IMPRESSION: Successful ultrasound guided right thoracentesis yielding 1.7 liters of pleural fluid.  Read by Brayton El PA-C   Original Report Authenticated By: Tacey Ruiz, MD     Scheduled Meds:    . aspirin  81 mg Oral Daily  . calcium-vitamin D  1 tablet Oral Daily  . carvedilol  12.5 mg Oral q morning - 10a  . carvedilol  6.25 mg Oral q morning - 10a  . docusate sodium  100 mg Oral BID  . exemestane  25 mg Oral QPC breakfast  . ferrous sulfate  325 mg Oral BID WC  . gabapentin  300 mg Oral QHS  . heparin  5,000 Units Subcutaneous Q8H  . piperacillin-tazobactam (ZOSYN)  IV  3.375 g Intravenous Q8H  . sodium chloride  3 mL Intravenous Q12H  . [DISCONTINUED] spironolactone  25 mg Oral Daily   Continuous Infusions:   Principal Problem:  *Healthcare-associated pneumonia Active Problems:  Essential hypertension, benign  Cardiomyopathy due to chemotherapy  Breast cancer, left  Pleural effusion on right  AKI (acute kidney injury)  Time spent: 30 min  Calub Tarnow, [facility]  [facility] Pager [phone]. If 7PM-7AM, please contact night-coverage at [email or web address], password  [facility] 08/02/2012, 7:45 PM  LOS: 4 days

## 2012-08-02 NOTE — Progress Notes (Signed)
Patient ID: Jeanette Morris, female   DOB: August 09, 1946, 66 y.o.   MRN: [id number]                    [address]            [place] [id number]          [phone]      Asked to see pt. Concerning large right pleural effusion. She is a 66 y.o. female with hx of inflammatory carcinoma of the right breast, s/p right modified radical mastectomy with axillary node dissection and chemo, contralateral axillary recurrence, HER-2 negative, cardiomyopathy who presents to ED complaining of shortness of breath that started after thanksgiving. CXR and CT showed large right pleural effusion. She had thoracentesis today removing 1.6 L of serous fluid. Cytology pending. Followup cxr showed small residual effusion.  Most likely this is a malignant effusion but will need to wait for cytology. She does not need anything done at this time. I will see her in my office on Tuesday next week and will check cxr to see if the effusion is recurring. If it does, she will need pleurx catheter, which I will arrange. I gave her my card and asked her to call for appt. Thanks.

## 2012-08-03 LAB — URINALYSIS, ROUTINE W REFLEX MICROSCOPIC
Bilirubin Urine: NEGATIVE
Glucose, UA: NEGATIVE mg/dL
Hgb urine dipstick: NEGATIVE
Ketones, ur: NEGATIVE mg/dL
Protein, ur: NEGATIVE mg/dL

## 2012-08-03 LAB — CBC
MCH: 27.1 pg (ref 26.0–34.0)
MCV: 84.9 fL (ref 78.0–100.0)
Platelets: 201 10*3/uL (ref 150–400)
RBC: 2.84 MIL/uL — ABNORMAL LOW (ref 3.87–5.11)

## 2012-08-03 LAB — BASIC METABOLIC PANEL
BUN: 16 mg/dL (ref 6–23)
CO2: 29 mEq/L (ref 19–32)
Calcium: 9 mg/dL (ref 8.4–10.5)
Creatinine, Ser: 1.84 mg/dL — ABNORMAL HIGH (ref 0.50–1.10)
Glucose, Bld: 95 mg/dL (ref 70–99)

## 2012-08-03 MED ORDER — SODIUM CHLORIDE 0.9 % IV SOLN
INTRAVENOUS | Status: DC
  Administered 2012-08-03 – 2012-08-04 (×3): via INTRAVENOUS

## 2012-08-03 MED ORDER — LOPERAMIDE HCL 2 MG PO CAPS
2.0000 mg | ORAL_CAPSULE | ORAL | Status: DC | PRN
  Administered 2012-08-03 – 2012-08-04 (×2): 2 mg via ORAL
  Filled 2012-08-03 (×2): qty 1

## 2012-08-03 MED ORDER — CARVEDILOL 6.25 MG PO TABS
6.2500 mg | ORAL_TABLET | Freq: Every day | ORAL | Status: DC
  Administered 2012-08-03 – 2012-08-05 (×3): 6.25 mg via ORAL
  Filled 2012-08-03 (×4): qty 1

## 2012-08-03 MED ORDER — CARVEDILOL 12.5 MG PO TABS
12.5000 mg | ORAL_TABLET | Freq: Two times a day (BID) | ORAL | Status: DC
  Administered 2012-08-03 – 2012-08-05 (×5): 12.5 mg via ORAL
  Filled 2012-08-03 (×7): qty 1

## 2012-08-03 NOTE — Progress Notes (Signed)
[facility] PROGRESS NOTE  Jeanette Morris VHQ:[id number] DOB: 01/13/1946 DOA: 07/29/2012 PCP: Egbert Garibaldi, NP  Assessment/Plan: Principal Problem:  *Healthcare-associated pneumonia   Active Problems:  Essential hypertension, benign  Cardiomyopathy due to chemotherapy  Breast cancer, left   1- Bacterial HCAP with right milddle and lower lobe consolidation with moderate right pleural effusion s/p 1.6L thoracentesis on 12/4.  Patient clinically improved but has loculation of the right lung.   -  Pip/tazo, day 6 -  Culture NGTD -  Cytology pending -  Appreciate CT surgery recommendations.  Patient to follow up in clinic next week  AKI : creatinine rising.   -  FENa -  UA -  Hydration -  Repeat electrolytes in AM  3-Cardiomyopathy: Treat for mild CHF, diastolic. Recent EF at 55. Hold spironolactone and lasix.  continue coreg.   4-Breast cancer: Will continue with aromasin. Will hold Afinitor due to acute infection.  - Will consider d/w with Dr Donnie Coffin prior to discharge regarding afinitor  5-Anemia: Post Chemo: baseline 9 to 10. On ferrous sulfate. Consider PRBC if HB < 7.   7.  Diarrhea, patient states his happens at home also sometimes and she take imodium. C. Diff neg.  Start imodium  Code Status: Full  Family Communication: Patient alone at bedside  Disposition:  Possibly home tomorrow   Consultants:  radiology Procedures:  CT chest Antibiotics:  Vanc and pip/tazo since 12/01  Vanc d/c 12/02   HPI/Subjective: Decreased appetite but drinking okay.   Stools are less frequent.  Denies chest pain, shrotness of breath.  Has dry cough.  Denies nausea, vomiting, diarrhea, dysuria, lower extremity edema.  Denies abdominal pain.    Objective: Filed Vitals:   08/02/12 1453 08/02/12 2115 08/03/12 0500 08/03/12 0540  BP: 127/78 130/54  139/66  Pulse: 92 87  99  Temp: 98.5 F (36.9 C) 99.1 F (37.3 C)  99.3 F (37.4 C)  TempSrc: Oral Oral  Oral  Resp: 18 18   18   Height:      Weight:   103.8 kg (228 lb 13.4 oz)   SpO2: 96% 98%  95%    Intake/Output Summary (Last 24 hours) at 08/03/12 0757 Last data filed at 08/02/12 1900  Gross per 24 hour  Intake    720 ml  Output      0 ml  Net    720 ml   Filed Weights   08/01/12 0516 08/02/12 0500 08/03/12 0500  Weight: 104.962 kg (231 lb 6.4 oz) 103.6 kg (228 lb 6.3 oz) 103.8 kg (228 lb 13.4 oz)    Exam: General: A&O x4, NAD, sitting in bed Cardiovascular: RRR S1S2 reg, no m/r/g  Respiratory:  Persistently diminished at the right base with course rales.  Also some rales at the left base.  No wheezes or rhonchi.  Stable from yesterday Abdomen: NT, ND BS+  Ext : No edema  Data Reviewed: Basic Metabolic Panel:  Lab 08/03/12 4132 08/01/12 0520 07/31/12 0540 07/30/12 0640 07/29/12 0951  NA 138 138 137 137 135  K 3.6 3.6 3.5 3.7 3.9  CL 100 100 102 99 99  CO2 29 30 27 29 27   GLUCOSE 95 97 99 115* 124*  BUN 16 16 15 15 16   CREATININE 1.84* 1.51* 1.43* 1.28* 1.08  CALCIUM 9.0 9.3 8.1* 9.1 9.3  MG -- -- -- -- 1.9  PHOS -- -- -- -- --   Liver Function Tests:  Lab 08/01/12 0520 07/31/12 0540 07/29/12 4401  AST 27 20 26   ALT 15 11 16   ALKPHOS 54 50 64  BILITOT 0.3 0.3 0.3  PROT 6.8 6.2 7.5  ALBUMIN 2.9* 2.6* 3.1*   No results found for this basename: LIPASE:5,AMYLASE:5 in the last 168 hours No results found for this basename: AMMONIA:5 in the last 168 hours CBC:  Lab 08/03/12 0550 08/01/12 0520 07/31/12 0540 07/30/12 0640 07/29/12 0951  WBC 4.3 2.8* 2.7* 2.6* 3.3*  NEUTROABS -- 1.9 1.6* -- 2.3  HGB 7.7* 8.3* 7.8* 8.5* 8.7*  HCT 24.1* 26.3* 24.5* 26.0* 26.7*  MCV 84.9 86.5 86.3 84.7 85.3  PLT 201 211 185 195 204   Cardiac Enzymes:  Lab 08/01/12 0520 07/31/12 2204 07/31/12 1355 07/31/12 0540 07/30/12 2139  CKTOTAL -- -- -- -- --  CKMB -- -- -- -- --  CKMBINDEX -- -- -- -- --  TROPONINI <0.30 <0.30 <0.30 <0.30 <0.30   BNP (last 3 results)  Basename 07/29/12 0950  PROBNP  509.5*   CBG: No results found for this basename: GLUCAP:5 in the last 168 hours  Recent Results (from the past 240 hour(s))  BODY FLUID CULTURE     Status: Normal (Preliminary result)   Collection Time   08/01/12 12:17 PM      Component Value Range Status Comment   Specimen Description PLEURAL   Final    Special Requests Normal   Final    Gram Stain     Final    Value: RARE WBC PRESENT,BOTH PMN AND MONONUCLEAR     NO ORGANISMS SEEN   Culture NO GROWTH 1 DAY   Final    Report Status PENDING   Incomplete   CLOSTRIDIUM DIFFICILE BY PCR     Status: Normal   Collection Time   08/02/12  6:00 AM      Component Value Range Status Comment   C difficile by pcr NEGATIVE  NEGATIVE Final      Studies: Dg Chest 1 View  08/01/2012  *RADIOLOGY REPORT*  Clinical Data: Post right-sided thoracentesis  CHEST - 1 VIEW  Comparison: 07/29/2012; 06/05/2010; chest CT - 07/30/2012  Findings:  Grossly unchanged enlarged cardiac silhouette and mediastinal contours.  Stable positioning of support apparatus.  Interval reduction in now small residual likely partially loculated right- sided pleural effusion post thoracentesis.  No definite pneumothorax.  Small left-sided pleural effusion is unchanged. Persistent bilateral mid and lower lung heterogeneous opacities are unchanged.  No new focal airspace opacities.  Surgical clips overlie the right axilla.  Unchanged bones.  IMPRESSION: 1.  Interval reduction in now small residual likely partially loculated right-sided pleural effusion post thoracentesis.  No pneumothorax.  2.  Unchanged small left-sided pleural effusion. 3.  Grossly unchanged bilateral mid and lower lung heterogeneous opacities, right greater than left, atelectasis versus infiltrate.   Original Report Authenticated By: Tacey Ruiz, MD    US Thoracentesis Asp Pleural Space W/img Guide  08/01/2012  *RADIOLOGY REPORT*  Clinical Data:  Shortness of breath, right-sided pleural effusion  ULTRASOUND GUIDED  right THORACENTESIS  Comparison:  None  An ultrasound guided thoracentesis was thoroughly discussed with the patient and questions answered.  The benefits, risks, alternatives and complications were also discussed.  The patient understands and wishes to proceed with the procedure.  Written consent was obtained.  Ultrasound was performed to localize and mark an adequate pocket of fluid in the right chest.  The area was then prepped and draped in the normal sterile fashion.  1% Lidocaine was used for local  anesthesia.  Under ultrasound guidance a 19 gauge Yueh catheter was introduced.  Thoracentesis was performed.  The catheter was removed and a dressing applied.  Complications:  None immediate  Findings: A total of approximately 1.7 liters of clear yellow fluid was removed. A fluid sample was sent for laboratory analysis.  IMPRESSION: Successful ultrasound guided right thoracentesis yielding 1.7 liters of pleural fluid.  Read by Brayton El PA-C   Original Report Authenticated By: Tacey Ruiz, MD     Scheduled Meds:    . aspirin  81 mg Oral Daily  . calcium-vitamin D  1 tablet Oral Daily  . carvedilol  12.5 mg Oral BID WC  . carvedilol  6.25 mg Oral Q breakfast  . docusate sodium  100 mg Oral BID  . exemestane  25 mg Oral QPC breakfast  . ferrous sulfate  325 mg Oral BID WC  . gabapentin  300 mg Oral QHS  . heparin  5,000 Units Subcutaneous Q8H  . piperacillin-tazobactam (ZOSYN)  IV  3.375 g Intravenous Q8H  . sodium chloride  3 mL Intravenous Q12H  . [DISCONTINUED] carvedilol  12.5 mg Oral q morning - 10a  . [DISCONTINUED] carvedilol  6.25 mg Oral q morning - 10a   Continuous Infusions:    . sodium chloride      Principal Problem:  *Healthcare-associated pneumonia Active Problems:  Essential hypertension, benign  Cardiomyopathy due to chemotherapy  Breast cancer, left  Pleural effusion on right  AKI (acute kidney injury)  Time spent: 30 min  Lenix Kidd, [facility]  [facility] Pager [phone]. If 7PM-7AM, please contact night-coverage at [email or web address], password [facility] 08/03/2012, 7:57 AM  LOS: 5 days

## 2012-08-03 NOTE — Progress Notes (Signed)
ANTIBIOTIC CONSULT NOTE - Follow Up  Pharmacy Consult for  Zosyn Indication: PNA  No Known Allergies  Patient Measurements: Height: 5\' 5"  (165.1 cm) Weight: 228 lb 13.4 oz (103.8 kg) IBW/kg (Calculated) : 57   Vital Signs: Temp: 99.3 F (37.4 C) (12/06 0540) Temp src: Oral (12/06 0540) BP: 139/66 mmHg (12/06 0540) Pulse Rate: 99  (12/06 0540) Intake/Output from previous day: 12/05 0701 - 12/06 0700 In: 720 [P.O.:720] Out: -  Intake/Output from this shift:    Labs:  Basename 08/03/12 0550 08/01/12 0520  WBC 4.3 2.8*  HGB 7.7* 8.3*  PLT 201 211  LABCREA -- --  CREATININE 1.84* 1.51*   Estimated Creatinine Clearance: 35 ml/min (by C-G formula based on Cr of 1.84).    Microbiology: Recent Results (from the past 720 hour(s))  BODY FLUID CULTURE     Status: Normal (Preliminary result)   Collection Time   08/01/12 12:17 PM      Component Value Range Status Comment   Specimen Description PLEURAL   Final    Special Requests Normal   Final    Gram Stain     Final    Value: RARE WBC PRESENT,BOTH PMN AND MONONUCLEAR     NO ORGANISMS SEEN   Culture NO GROWTH 1 DAY   Final    Report Status PENDING   Incomplete   CLOSTRIDIUM DIFFICILE BY PCR     Status: Normal   Collection Time   08/02/12  6:00 AM      Component Value Range Status Comment   C difficile by pcr NEGATIVE  NEGATIVE Final     Me Medications:  Prescriptions prior to admission  Medication Sig Dispense Refill  . aspirin 81 MG chewable tablet Chew 81 mg by mouth daily.      . Calcium Carbonate-Vitamin D 600-400 MG-UNIT per tablet Take 1 tablet by mouth every morning.       . carvedilol (COREG) 12.5 MG tablet Take 12.5 mg by mouth every morning. Take With 6.25 mg tablet to make a total strength of 18.75mg       . carvedilol (COREG) 6.25 MG tablet Take 6.25 mg by mouth every morning. Take With 12.5 mg tablet to make a total strength of 18.75mg       . everolimus (AFINITOR) 5 MG tablet Take 5 mg by mouth daily after  breakfast.       . exemestane (AROMASIN) 25 MG tablet Take 25 mg by mouth daily after breakfast.      . gabapentin (NEURONTIN) 300 MG capsule Take 300 mg by mouth at bedtime.        Marland Kitchen lisinopril (PRINIVIL,ZESTRIL) 40 MG tablet Take 40 mg by mouth every morning.      Marland Kitchen oxyCODONE-acetaminophen (PERCOCET/ROXICET) 5-325 MG per tablet Take 1-2 tablets by mouth every 6 (six) hours as needed. For pain      . spironolactone (ALDACTONE) 25 MG tablet Take 1 tablet (25 mg total) by mouth daily.  30 tablet  3   Anti-infectives     Start     Dose/Rate Route Frequency Ordered Stop   07/29/12 2200   vancomycin (VANCOCIN) IVPB 1000 mg/200 mL premix  Status:  Discontinued        1,000 mg 200 mL/hr over 60 Minutes Intravenous Every 12 hours 07/29/12 1412 07/30/12 1600   07/29/12 2000   piperacillin-tazobactam (ZOSYN) IVPB 3.375 g        3.375 g 12.5 mL/hr over 240 Minutes Intravenous Every 8 hours 07/29/12 1412  07/29/12 1145   piperacillin-tazobactam (ZOSYN) IVPB 3.375 g        3.375 g 12.5 mL/hr over 240 Minutes Intravenous  Once 07/29/12 1138 07/29/12 1545   07/29/12 1145   vancomycin (VANCOCIN) IVPB 1000 mg/200 mL premix        1,000 mg 200 mL/hr over 60 Minutes Intravenous  Once 07/29/12 1138 07/29/12 1435   07/29/12 1145   azithromycin (ZITHROMAX) 500 mg in dextrose 5 % 250 mL IVPB        500 mg 250 mL/hr over 60 Minutes Intravenous  Once 07/29/12 1138 07/29/12 1258         Assessment:  66 yo F admitted 12/1 with CP and SOB. Hx breast Ca on oral chemotherapy.  Today is Day #6 Zosyn EI.  SCr continues to trend up, CrCl ~ 35 ml/min    Plan:   Continue Zosyn 3.375g IV Q8H infused over 4hrs.  Continue to monitor renal function   Hessie Knows, PharmD, BCPS Pager [phone] 08/03/2012 9:48 AM

## 2012-08-04 LAB — CBC
Hemoglobin: 7.7 g/dL — ABNORMAL LOW (ref 12.0–15.0)
MCH: 27.3 pg (ref 26.0–34.0)
MCV: 86.5 fL (ref 78.0–100.0)
RBC: 2.82 MIL/uL — ABNORMAL LOW (ref 3.87–5.11)

## 2012-08-04 LAB — BASIC METABOLIC PANEL
CO2: 29 mEq/L (ref 19–32)
Calcium: 8.8 mg/dL (ref 8.4–10.5)
Chloride: 102 mEq/L (ref 96–112)
Creatinine, Ser: 1.84 mg/dL — ABNORMAL HIGH (ref 0.50–1.10)
Glucose, Bld: 97 mg/dL (ref 70–99)

## 2012-08-04 LAB — BODY FLUID CULTURE

## 2012-08-04 MED ORDER — LABETALOL HCL 5 MG/ML IV SOLN
10.0000 mg | INTRAVENOUS | Status: DC | PRN
  Filled 2012-08-04: qty 4

## 2012-08-04 NOTE — Discharge Summary (Signed)
Physician Discharge Summary  Jeanette Morris ZOX:[id number] DOB: Jun 30, 1946 DOA: 07/29/2012  PCP: Egbert Garibaldi, NP  Admit date: 07/29/2012 Discharge date: 08/05/2012  Recommendations for Outpatient Follow-up:  1. Follow up with primary care doctor in 1 week for blood pressure check and repeat CBC and BMP.  CBC to evaluate extent of anemia and BMP to evaluate creatinine.  Consider restarting ACEI and spironolactone at that time.   2. Follow up with CT surgery, oncology already arranged  Discharge Diagnoses:  Principal Problem:  *Healthcare-associated pneumonia Active Problems:  Essential hypertension, benign  Cardiomyopathy due to chemotherapy  Breast cancer, left  Pleural effusion on right  AKI (acute kidney injury)   Discharge Condition: stable, improved  Diet recommendation:  Low sodium, healthy heart  Wt Readings from Last 3 Encounters:  08/04/12 104.237 kg (229 lb 12.8 oz)  07/23/12 107.865 kg (237 lb 12.8 oz)  07/17/12 109.68 kg (241 lb 12.8 oz)    History of present illness:   Jeanette Morris is a 66 y.o. female with PMH of inflammatory carcinoma of the right breast, s/p right modified radical mastectomy with axillary node dissection and chemo, contralateral axillary recurrence, HER-2 negative, Cardiomyopathy who presents to ED complaining of shortness of breath that started after thanksgiving. She relates dyspnea got worse last night. She describe orthopnea. She relates chest pain. She relates shortness of breath on exertion. She denies cough, fevers.   Hospital Course:   Dyspnea:  Ms. Cragin was underwent CT of the chest with contrast which demonstrated a large right pleural effusion with atelectasis versus consolidation of the right middle and right lower lobes.  It also demonstrated enlarged left axillary lymph nodes consistent with metastatic adenopathy, and porcelain gallbladder.  She was started on vancomycin and zosyn for health care associated pneumonia and  underwent thoracentesis on 12/4.  She had 1.6L of clear yellow fluid removed which was exudative based on protein, but had only mildly elevated WBC.  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  Her dyspnea improved dramatically after thoracentesis and she was quickly weaned to room air from nasal canula.  Her vancomycin was discontinued after 48 hours and she continued zosyn for a total of 7 days of antibiotics.  Because post-thoracentesis CXR was suggestive of loculation, CT surgery was consulted who recommended against VATS and asked that the patient follow up with them in clinic in 1-2 weeks.    AKI:  She developed some acute kidney injury that was felt to be due to contrast induced nephropathy versus mild dehydration.  Her FENa was close to 1%.  The patient's baseline creatinine is near one and her creatinine peaked at 1.8 on 12/6 and 12/7.  It trended down with gentle hydration.  Her ACEI, spironolactone and lasix were held and ACEI/spironoloactone may be restarted by her PCP early this week if creatinine tolerates.  She should resume her spironolactone if she gains more than 3-lbs in one day or 5-lbs by the end of the week and call her PCP.  Cardiomyopathy:  Recent EF at 55.  She was initially diuresed, but developed AKI so her ACEI, spironolactone were held.  She continued coreg.  Consider ACEI as outpatient if creatinine continues to improve  Breast cancer: She continued her aromasin.Her Afinitor was held due to possible acute infection.  Discussed with Dr. Arline Asp and the Afinitor was stopped.  The patient was advised to call [name] on Monday  to discuss when to restart this medication.    Anemia: Post Chemo: baseline 9 to 10. She was started on ferrous sulfate. Her hemoglobin was mildly depressed to the mid-7s range.  She did not require blood transfusion at this time.    Diarrhea:  Patient states his  happens at home also sometimes and she take imodium.  Her stools were soft, but not broth-like.  C. Diff was neg.  She continued imodium.    HTN:  ACEI and spironolactone were held at time of discharge.  Her blood pressure remained in the low normal range despite holding these medications.  She should have her blood pressure and creatinine checked by her PCP to determine when to restart these medications.    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  She has a follow up appointment with surgery in January and can address at that time.    Consultants:  radiology Procedures:  CT chest Thoracentesis 12/4 Antibiotics:  Vanc 12/1>>12/2 Zosyn 12/01 >>12/7   Discharge Exam: Filed Vitals:   08/05/12 0527  BP: 141/64  Pulse: 86  Temp: 98.4 F (36.9 C)  Resp: 20   Filed Vitals:   08/04/12 1445 08/04/12 1709 08/04/12 2149 08/05/12 0527  BP: 119/87 102/75 129/53 141/64  Pulse: 79 88 80 86  Temp: 98.5 F (36.9 C)  98.6 F (37 C) 98.4 F (36.9 C)  TempSrc: Oral  Oral Oral  Resp: 18  18 20   Height:      Weight:      SpO2: 100%  93% 95%    General: A&O x4, NAD  Cardiovascular: RRR S1S2 reg, no m/r/g  Respiratory: Persistently diminished at the right base with faint rales. Left base clear. No wheezes or rhonchi.  Abdomen: NT, ND BS+  Ext : No edema  Discharge Instructions      Discharge Orders    Future Appointments: Provider: Department: Dept Phone: Center:   08/07/2012 1:30 PM Alleen Borne, MD [facility] [place] [phone] [facility]   08/31/2012 10:00 AM [facility] MEDICAL ONCOLOGY [phone] None   08/31/2012 10:30 AM Pierce Crane, MD [facility] MEDICAL ONCOLOGY [phone] None   09/18/2012 9:00 AM Robyne Askew, MD [facility] [place] [phone] None     Future Orders Please Complete By  Expires   Diet - low sodium heart healthy      Increase activity slowly      Discharge instructions      Comments:   You were hospitalized with difficulty breathing. You were treated for pneumonia with vancomycin and zosyn and completed a 7-day course of zosyn.  You were found to have a large right pleural effusion, or fluid around your right lung.  You had a thoracentesis on 12/4 to drain 1.6L of fluid from the right lung.  It was tested for infection and the culture so far is negative.  There is a test for cancer that is still pending at the time of discharge.  Please follow up with cardiothoracic surgery for a repeat Xray and to review the final test results.  You also had some injury to your kidney, which should improve over time.  Two medications that can effect the kidneys were stopped:  Lisinopril and spironolactone.  Please stop taking these medications until you can follow up with your primary care doctor or your cardiologist later this week.  They should repeat blood work and check your blood pressure.  If you gain more than 3-lbs in 1 day or 5-lbs in one week, please restart your spironolactone and call your primary care doctor.  Please also stop your Afinitor and call your Oncologist on Monday to find out when you should restart this medication.   Call MD for:  temperature >100.4      Call MD for:  persistant nausea and vomiting      Call MD for:  severe uncontrolled pain      Call MD for:  redness, tenderness, or signs of infection (pain, swelling, redness, odor or green/yellow discharge around incision site)      Call MD for:  difficulty breathing, headache or visual disturbances      Call MD for:  persistant dizziness or light-headedness      Call MD for:  hives      Call MD for:  extreme fatigue      (HEART FAILURE PATIENTS) Call MD:  Anytime you have any of the following symptoms: 1) 3 pound weight gain in 24 hours or 5 pounds in 1 week 2) shortness of breath, with or without a dry  hacking cough 3) swelling in the hands, feet or stomach 4) if you have to sleep on extra pillows at night in order to breathe.          Medication List     As of 08/05/2012  8:38 AM    STOP taking these medications         everolimus 5 MG tablet   Commonly known as: AFINITOR      lisinopril 40 MG tablet   Commonly known as: PRINIVIL,ZESTRIL      spironolactone 25 MG tablet   Commonly known as: ALDACTONE      TAKE these medications         aspirin 81 MG chewable tablet   Chew 81 mg by mouth daily.      Calcium Carbonate-Vitamin D 600-400 MG-UNIT per tablet   Take 1 tablet by mouth every morning.      carvedilol 6.25 MG tablet   Commonly known as: COREG   Take 6.25 mg by mouth every morning. Take With 12.5 mg tablet to make a total strength of 18.75mg       carvedilol 12.5 MG tablet   Commonly known as: COREG   Take 12.5 mg by mouth every morning. Take With 6.25 mg tablet to make a total strength of 18.75mg       exemestane 25 MG tablet   Commonly known as: AROMASIN   Take 25 mg by mouth daily after breakfast.      ferrous sulfate 325 (65 FE) MG tablet   Take 1 tablet (325 mg total) by mouth daily with breakfast.      gabapentin 300 MG capsule   Commonly known as: NEURONTIN   Take 300 mg by mouth at bedtime.      oxyCODONE-acetaminophen 5-325 MG per tablet   Commonly known as: PERCOCET/ROXICET   Take 1-2 tablets by mouth every 6 (six) hours as needed. For pain         Follow-up Information    Follow up with Alleen Borne, MD. On 08/07/2012. (1:30PM)    Contact information:   [address] [place] [address] [phone]       Follow up with Egbert Garibaldi, NP. Schedule an appointment as soon as possible for a visit in 1 week. (BMP,  CBC, blood pressure check)    Contact information:   [facility] [address] [place] [address] [phone]       Follow up with Stephanie Acre. On 08/31/2012. (10AM)    Contact  information:   Medical Oncology [address] [place] [address] [phone]       Follow up with Pierce Crane, MD. On 08/31/2012. (10:30AM)    Contact information:   [address] [place] [address] [phone]       Follow up with Robyne Askew, MD. On 09/18/2012. Chana Bode)    Contact information:   [address] [place] [address] [phone]           The results of significant diagnostics from this hospitalization (including imaging, microbiology, ancillary and laboratory) are listed below for reference.    Significant Diagnostic Studies: Dg Chest 1 View  08/01/2012  *RADIOLOGY REPORT*  Clinical Data: Post right-sided thoracentesis  CHEST - 1 VIEW  Comparison: 07/29/2012; 06/05/2010; chest CT - 07/30/2012  Findings:  Grossly unchanged enlarged cardiac silhouette and mediastinal contours.  Stable positioning of support apparatus.  Interval reduction in now small residual likely partially loculated right- sided pleural effusion post thoracentesis.  No definite pneumothorax.  Small left-sided pleural effusion is unchanged. Persistent bilateral mid and lower lung heterogeneous opacities are unchanged.  No new focal airspace opacities.  Surgical clips overlie the right axilla.  Unchanged bones.  IMPRESSION: 1.  Interval reduction in now small residual likely partially loculated right-sided pleural effusion post thoracentesis.  No pneumothorax.  2.  Unchanged small left-sided pleural effusion. 3.  Grossly unchanged bilateral mid and lower lung heterogeneous opacities, right greater than left, atelectasis versus infiltrate.   Original Report Authenticated By: Tacey Ruiz, MD    Dg Chest 2 View  07/29/2012  *RADIOLOGY REPORT*  Clinical Data: Estefany Goebel of breath, chest pain  CHEST - 2 VIEW  Comparison: Most recent prior PET CT 04/03/2012  Findings: Interval development of a dense right basilar opacity with thickening versus fluid in the elevated minor fissure.  The  lateral view is limited by kyphotic patient positioning and body habitus.  This likely represents a combination of right-sided pleural effusion with right lower lobe and inferior right middle lobe atelectasis versus consolidation.  The left lung is clear. Unchanged cardiac and mediastinal contours.  Left subclavian approach portacatheter remains in unchanged position.  The catheter tip projects over the confluence of the left brachiocephalic vein and superior vena cava and is directed perpendicular into the lateral caval wall. Surgical clips the right axilla.  IMPRESSION:  1.  Interval development of right-sided pleural effusion and dense right lower lobe and inferior right middle lobe opacity raising concern for pneumonia with parapneumonic effusion.  Given the patient's prior history of breast cancer, and underlying nodule or mass is difficult to exclude.  2.  Unchanged position of left subclavian approach portacatheter with the tip perpendicular to the superior vena cava.   Original Report Authenticated By: Malachy Moan, M.D.    Ct Chest W Contrast  07/30/2012  *RADIOLOGY REPORT*  Clinical Data: Chest pain and shortness of breath  CT CHEST WITH CONTRAST  Technique:  Multidetector CT imaging of the chest was performed following the standard protocol during bolus administration of intravenous contrast.  Contrast: 80mL OMNIPAQUE IOHEXOL 300 MG/ML  SOLN  Comparison: 04/03/2012  Findings: Lungs/pleura: There is a large right pleural effusion. Small left pleural effusion is present.  Compressive type atelectasis  and consolidation of the right lower lobe and right middle lobe is identified.  Left lung is clear.  Heart/Mediastinum: There is moderate cardiac enlargement.  Very small pericardial effusion is noted.  Calcifications involving the left main, LAD coronary arteries are noted.  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 No mediastinal or hilar  adenopathy identified.  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  Asymmetric dilatation of the right renal collecting system is identified, image number 65.  This is of uncertain clinical significance/etiology.  Bones/Musculoskeletal:    The patient is status post previous right mastectomy.  There is abnormal skin thickening overlying the left breast which is new from previous exam.  Multiple enlarged and enhancing left axillary lymph nodes are identified and appears similar to previous exam.  These are worrisome for metastatic adenopathy. Review of the visualized osseous structures is significant for multilevel thoracic spondylosis.No aggressive lytic or sclerotic bone lesions identified.  IMPRESSION:  1.  Interval development of large right pleural effusion with associated compressive type atelectasis and consolidation of the right middle lobe and right lower lobe.  2.  Small left effusion.  3.  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  4.  Skin thickening overlying the left breast may be treatment related.  5.  Porcelain gallbladder.   Original Report Authenticated By: Signa Kell, M.D.    US Thoracentesis Asp Pleural Space W/img Guide  08/01/2012  *RADIOLOGY REPORT*  Clinical Data:  Shortness of breath, right-sided pleural effusion  ULTRASOUND GUIDED right THORACENTESIS  Comparison:  None  An ultrasound guided thoracentesis was thoroughly discussed with the patient and questions answered.  The benefits, risks, alternatives and complications were also discussed.  The patient understands and wishes to proceed with the procedure.  Written consent was obtained.  Ultrasound was performed to localize and mark an adequate pocket of fluid in the right chest.  The area was then prepped and draped in the normal sterile fashion.  1% Lidocaine was used for local anesthesia.  Under ultrasound guidance a 19 gauge Yueh catheter was introduced.  Thoracentesis was performed.  The  catheter was removed and a dressing applied.  Complications:  None immediate  Findings: A total of approximately 1.7 liters of clear yellow fluid was removed. A fluid sample was sent for laboratory analysis.  IMPRESSION: Successful ultrasound guided right thoracentesis yielding 1.7 liters of pleural fluid.  Read by Brayton El PA-C   Original Report Authenticated By: Tacey Ruiz, MD     Microbiology: Recent Results (from the past 240 hour(s))  BODY FLUID CULTURE     Status: Normal   Collection Time   08/01/12 12:17 PM      Component Value Range Status Comment   Specimen Description PLEURAL   Final    Special Requests Normal   Final    Gram Stain     Final    Value: RARE WBC PRESENT,BOTH PMN AND MONONUCLEAR     NO ORGANISMS SEEN   Culture NO GROWTH 3 DAYS   Final    Report Status 08/04/2012 FINAL   Final   CLOSTRIDIUM DIFFICILE BY PCR     Status: Normal   Collection Time   08/02/12  6:00 AM      Component Value Range Status Comment   C difficile by pcr NEGATIVE  NEGATIVE Final      Labs: Basic Metabolic Panel:  Lab 08/05/12 0454 08/04/12 0437 08/03/12 0550 08/01/12 0520 07/31/12 0540 07/29/12 0981  NA 138 138 138 138 137 --  K 3.7 3.5 3.6 3.6 3.5 --  CL 104 102 100 100 102 --  CO2 28 29 29 30 27  --  GLUCOSE 96 97 95 97 99 --  BUN 12 13 16 16 15  --  CREATININE 1.70* 1.84* 1.84* 1.51* 1.43* --  CALCIUM 8.9 8.8 9.0 9.3 8.1* --  MG -- -- -- -- -- 1.9  PHOS -- -- -- -- -- --   Liver Function Tests:  Lab 08/01/12 0520 07/31/12 0540 07/29/12 0951  AST 27 20 26   ALT 15 11 16   ALKPHOS 54 50 64  BILITOT 0.3 0.3 0.3  PROT 6.8 6.2 7.5  ALBUMIN 2.9* 2.6* 3.1*   No results found for this basename: LIPASE:5,AMYLASE:5 in the last 168 hours No results found for this basename: AMMONIA:5 in the last 168 hours CBC:  Lab 08/05/12 0520 08/04/12 0437 08/03/12 0550 08/01/12 0520 07/31/12 0540 07/29/12 0951  WBC 3.5* 3.5* 4.3 2.8* 2.7* --  NEUTROABS -- -- -- 1.9 1.6* 2.3  HGB 7.6* 7.7*  7.7* 8.3* 7.8* --  HCT 24.5* 24.4* 24.1* 26.3* 24.5* --  MCV 86.9 86.5 84.9 86.5 86.3 --  PLT 208 205 201 211 185 --   Cardiac Enzymes:  Lab 08/01/12 0520 07/31/12 2204 07/31/12 1355 07/31/12 0540 07/30/12 2139  CKTOTAL -- -- -- -- --  CKMB -- -- -- -- --  CKMBINDEX -- -- -- -- --  TROPONINI <0.30 <0.30 <0.30 <0.30 <0.30   BNP: BNP (last 3 results)  Basename 07/29/12 0950  PROBNP 509.5*   CBG: No results found for this basename: GLUCAP:5 in the last 168 hours  Time coordinating discharge: 45 minutes  Signed:  Krishawn Vanderweele  [facility] 08/05/2012, 8:38 AM

## 2012-08-04 NOTE — Progress Notes (Signed)
[facility] PROGRESS NOTE  Jeanette Morris EAV:[id number] DOB: May 20, 1946 DOA: 07/29/2012 PCP: Egbert Garibaldi, NP  Assessment/Plan: Principal Problem:  *Healthcare-associated pneumonia   Active Problems:  Essential hypertension, benign  Cardiomyopathy due to chemotherapy  Breast cancer, left   1- Bacterial HCAP with right milddle and lower lobe consolidation with moderate right pleural effusion s/p 1.6L thoracentesis on 12/4.  Patient clinically improved but has loculation of the right lung.   -  Pip/tazo, day 7/7 -  Culture NGTD -  Cytology pending -  Appreciate CT surgery recommendations.  Patient to follow up in clinic next week  AKI : creatinine stable and persistently elevated today.  FENa ~1%, equiv.  Consider dehydration v. AIN v. CIN -  Continue hydration -  Repeat electrolytes in AM -  If trending down, will discharge and have patient follow up with PCP next week for electrolytes, blood pressure check and restart of home medications.    3-Cardiomyopathy: Treat for mild CHF, diastolic. Recent EF at 55.  -  Continue to hold spironolactone and lasix.  continue coreg.   4-Breast cancer: Will continue with aromasin. Will hold Afinitor due to acute infection.  - Will consider d/w with Dr Donnie Coffin prior to discharge regarding afinitor  5-Anemia: Post Chemo: baseline 9 to 10. On ferrous sulfate. Consider PRBC if HB < 7.   7.  Diarrhea, patient states his happens at home also sometimes and she take imodium. C. Diff neg.   -  Continue imodium  HTN:  Holding ACEI and spironolactone -  Trend BP as anticipate that we will restart these medications soon -  Add labetalol IV prn SBP>160 or DBP>100  Code Status: Full  Family Communication: Patient alone at bedside  Disposition:  Possibly home tomorrow if creatinine trending down  Consultants:  radiology Procedures:  CT chest Antibiotics:  Vanc and pip/tazo since 12/01  Vanc d/c 12/02   HPI/Subjective: Decreased  appetite still.   Stools are less frequent but still soft.  Received imodium yesterday.  Denies chest pain, shrotness of breath.  Denies nausea, vomiting, lower extremity edema.  Denies abdominal pain.    Objective: Filed Vitals:   08/03/12 2241 08/04/12 0044 08/04/12 0530 08/04/12 0637  BP: 138/55 145/64 147/53   Pulse: 92 95 92   Temp: 98.8 F (37.1 C) 98.7 F (37.1 C) 98.1 F (36.7 C)   TempSrc: Oral Oral Oral   Resp: 20 18 16    Height:      Weight:    104.237 kg (229 lb 12.8 oz)  SpO2: 95% 93% 93%     Intake/Output Summary (Last 24 hours) at 08/04/12 0753 Last data filed at 08/04/12 0541  Gross per 24 hour  Intake 2418.75 ml  Output    800 ml  Net 1618.75 ml   Filed Weights   08/02/12 0500 08/03/12 0500 08/04/12 0637  Weight: 103.6 kg (228 lb 6.3 oz) 103.8 kg (228 lb 13.4 oz) 104.237 kg (229 lb 12.8 oz)    Exam: General: A&O x4, NAD,  Eating breakfast Cardiovascular: RRR S1S2 reg, no m/r/g  Respiratory:  Persistently diminished at the right base with decreased rales.  Left base clear.  No wheezes or rhonchi.  Abdomen: NT, ND BS+  Ext : No edema  Data Reviewed: Basic Metabolic Panel:  Lab 08/04/12 7829 08/03/12 0550 08/01/12 0520 07/31/12 0540 07/30/12 0640 07/29/12 0951  NA 138 138 138 137 137 --  K 3.5 3.6 3.6 3.5 3.7 --  CL 102 100 100 102  99 --  CO2 29 29 30 27 29  --  GLUCOSE 97 95 97 99 115* --  BUN 13 16 16 15 15  --  CREATININE 1.84* 1.84* 1.51* 1.43* 1.28* --  CALCIUM 8.8 9.0 9.3 8.1* 9.1 --  MG -- -- -- -- -- 1.9  PHOS -- -- -- -- -- --   Liver Function Tests:  Lab 08/01/12 0520 07/31/12 0540 07/29/12 0951  AST 27 20 26   ALT 15 11 16   ALKPHOS 54 50 64  BILITOT 0.3 0.3 0.3  PROT 6.8 6.2 7.5  ALBUMIN 2.9* 2.6* 3.1*   No results found for this basename: LIPASE:5,AMYLASE:5 in the last 168 hours No results found for this basename: AMMONIA:5 in the last 168 hours CBC:  Lab 08/04/12 0437 08/03/12 0550 08/01/12 0520 07/31/12 0540 07/30/12 0640  07/29/12 0951  WBC 3.5* 4.3 2.8* 2.7* 2.6* --  NEUTROABS -- -- 1.9 1.6* -- 2.3  HGB 7.7* 7.7* 8.3* 7.8* 8.5* --  HCT 24.4* 24.1* 26.3* 24.5* 26.0* --  MCV 86.5 84.9 86.5 86.3 84.7 --  PLT 205 201 211 185 195 --   Cardiac Enzymes:  Lab 08/01/12 0520 07/31/12 2204 07/31/12 1355 07/31/12 0540 07/30/12 2139  CKTOTAL -- -- -- -- --  CKMB -- -- -- -- --  CKMBINDEX -- -- -- -- --  TROPONINI <0.30 <0.30 <0.30 <0.30 <0.30   BNP (last 3 results)  Basename 07/29/12 0950  PROBNP 509.5*   CBG: No results found for this basename: GLUCAP:5 in the last 168 hours  Recent Results (from the past 240 hour(s))  BODY FLUID CULTURE     Status: Normal (Preliminary result)   Collection Time   08/01/12 12:17 PM      Component Value Range Status Comment   Specimen Description PLEURAL   Final    Special Requests Normal   Final    Gram Stain     Final    Value: RARE WBC PRESENT,BOTH PMN AND MONONUCLEAR     NO ORGANISMS SEEN   Culture NO GROWTH 2 DAYS   Final    Report Status PENDING   Incomplete   CLOSTRIDIUM DIFFICILE BY PCR     Status: Normal   Collection Time   08/02/12  6:00 AM      Component Value Range Status Comment   C difficile by pcr NEGATIVE  NEGATIVE Final      Studies: No results found.  Scheduled Meds:    . aspirin  81 mg Oral Daily  . calcium-vitamin D  1 tablet Oral Daily  . carvedilol  12.5 mg Oral BID WC  . carvedilol  6.25 mg Oral Q breakfast  . docusate sodium  100 mg Oral BID  . exemestane  25 mg Oral QPC breakfast  . ferrous sulfate  325 mg Oral BID WC  . gabapentin  300 mg Oral QHS  . heparin  5,000 Units Subcutaneous Q8H  . piperacillin-tazobactam (ZOSYN)  IV  3.375 g Intravenous Q8H  . sodium chloride  3 mL Intravenous Q12H   Continuous Infusions:    . sodium chloride 75 mL/hr at 08/04/12 0454    Principal Problem:  *Healthcare-associated pneumonia Active Problems:  Essential hypertension, benign  Cardiomyopathy due to chemotherapy  Breast cancer,  left  Pleural effusion on right  AKI (acute kidney injury)  Time spent: 30 min  Joclyn Alsobrook, [facility]  [facility] Pager [phone]. If 7PM-7AM, please contact night-coverage at [email or web address], password [facility] 08/04/2012, 7:53 AM  LOS: 6 days

## 2012-08-05 LAB — BASIC METABOLIC PANEL
BUN: 12 mg/dL (ref 6–23)
CO2: 28 mEq/L (ref 19–32)
Calcium: 8.9 mg/dL (ref 8.4–10.5)
Creatinine, Ser: 1.7 mg/dL — ABNORMAL HIGH (ref 0.50–1.10)

## 2012-08-05 LAB — CBC
HCT: 24.5 % — ABNORMAL LOW (ref 36.0–46.0)
MCH: 27 pg (ref 26.0–34.0)
MCV: 86.9 fL (ref 78.0–100.0)
Platelets: 208 10*3/uL (ref 150–400)
RBC: 2.82 MIL/uL — ABNORMAL LOW (ref 3.87–5.11)
RDW: 14 % (ref 11.5–15.5)

## 2012-08-05 MED ORDER — FERROUS SULFATE 325 (65 FE) MG PO TABS: 325.0000 mg | ORAL_TABLET | Freq: Every day | ORAL | Status: AC

## 2012-08-05 MED ORDER — HEPARIN SOD (PORK) LOCK FLUSH 100 UNIT/ML IV SOLN
INTRAVENOUS | Status: AC
  Filled 2012-08-05: qty 5

## 2012-08-05 MED ORDER — OXYCODONE-ACETAMINOPHEN 5-325 MG PO TABS: 1.0000 | ORAL_TABLET | Freq: Four times a day (QID) | ORAL | Status: DC | PRN

## 2012-08-05 NOTE — Progress Notes (Signed)
Pt. Left via WC with family. No resp. Distress noted.

## 2012-08-05 NOTE — Progress Notes (Signed)
Discharge teaching completed. MD discharge instructions given and reviewed with teach back. Pt. Has list of Dr. Algie Coffer and medications. Handouts given on Pneumonia, Thorancentesis, Pleural Effusion. Answered questions. Family at bedside. Prescription given for Percocet. No resp. Distress noted.

## 2012-08-05 NOTE — Plan of Care (Signed)
Problem: Phase I Progression Outcomes Goal: Flu/PneumoVaccines if indicated Outcome: Not Applicable Date Met:  08/05/12 Pt. Taking chemotherapy not indicated.

## 2012-08-05 NOTE — Plan of Care (Signed)
Problem: Discharge Progression Outcomes Goal: Flu vaccine received if indicated Outcome: Not Met (add Reason) Pt. On chemotherapy not indicated. Goal: Pneumonia vaccine received if indicated Outcome: Not Met (add Reason) Pt. On chemo therapy not indicated

## 2012-08-06 ENCOUNTER — Other Ambulatory Visit: Payer: Self-pay | Admitting: Oncology

## 2012-08-07 ENCOUNTER — Other Ambulatory Visit: Payer: Self-pay | Admitting: *Deleted

## 2012-08-07 ENCOUNTER — Encounter: Payer: Self-pay | Admitting: Surgery

## 2012-08-07 ENCOUNTER — Ambulatory Visit (INDEPENDENT_AMBULATORY_CARE_PROVIDER_SITE_OTHER): Payer: Medicare Other | Admitting: Surgery

## 2012-08-07 ENCOUNTER — Encounter (HOSPITAL_COMMUNITY): Payer: Self-pay | Admitting: *Deleted

## 2012-08-07 ENCOUNTER — Encounter (HOSPITAL_COMMUNITY): Payer: Self-pay | Admitting: Pharmacy Technician

## 2012-08-07 ENCOUNTER — Ambulatory Visit
Admission: RE | Admit: 2012-08-07 | Discharge: 2012-08-07 | Disposition: A | Discharge status: Home / Self Care | Payer: Medicare Other | Source: Ambulatory Visit | Attending: Surgery | Admitting: Surgery

## 2012-08-07 VITALS — BP 150/76 | HR 90 | Resp 20 | Ht 65.0 in | Wt 228.0 lb

## 2012-08-07 DIAGNOSIS — J90 Pleural effusion, not elsewhere classified: Secondary | ICD-10-CM

## 2012-08-07 DIAGNOSIS — J910 Malignant pleural effusion: Secondary | ICD-10-CM

## 2012-08-07 NOTE — Progress Notes (Signed)
                  [address]            [place] [address]          [phone]       PCP is [name], KIMBERLY M, NP  Referring Provider is Short, Mackenzie, MD  Chief Complaint   Patient presents with   .  Pleural Effusion     Surgical eval with CXR, I/P consult from 08/02/2012   HPI:  The patient is an 66-year-old black female with a history of inflammatory carcinoma of the right breast, status post right modified radical mastectomy with axillary node dissection and chemotherapy who had contralateral axillary recurrence. Her tumor is HER-2 negative. 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 She presented to the emergency room last week with shortness of breath that started after Thanksgiving. Chest x-ray and CT scan showed a large right pleural effusion with collapse of the right lung. She had thoracentesis performed removing 1.6 L of serous fluid. 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 Followup chest x-ray showed a small to moderate sized residual right pleural effusion and small left pleural effusion. Her shortness of breath was significantly improved after thoracentesis. I saw her initially in the hospital and arranged for her followup in the office today. She said that at rest her breathing is pretty good but when she is ambulatory she get short of breath.  Past Medical History   Diagnosis  Date   .  Hypertension    .  Hearing loss      right ear   .  Hyperlipidemia    .  Arthritis    .  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  Breast cancer      (Rt) breast ca dx 9/11    Past Surgical History   Procedure  Date   .  Tubal ligation    .  Portacath placement    .  Abdominal hysterectomy    .  Lung biopsy    .  Lesion excision      L anterior thigh   .  Breast surgery  12/01/10     ER+,PR+,Ki 67 64%,Her2 -; right breast lumpectomy   History reviewed. No pertinent family history.  Social History  History     Substance Use Topics   .  Smoking status:  Former Smoker     Quit date:  06/30/1973   .  Smokeless tobacco:  Never Used   .  Alcohol Use:  No    Current Outpatient Prescriptions   Medication  Sig  Dispense  Refill   .  aspirin 81 MG chewable tablet  Chew 81 mg by mouth daily.     .  Calcium Carbonate-Vitamin D 600-400 MG-UNIT per tablet  Take 1 tablet by mouth every morning.     .  carvedilol (COREG) 12.5 MG tablet  Take 12.5 mg by mouth every morning. Take With 6.25 mg tablet to make a total strength of 18.75mg     .  carvedilol (COREG) 6.25 MG tablet  Take 6.25 mg by mouth every morning. Take With 12.5 mg tablet to make a total strength of 18.75mg     .  exemestane (AROMASIN) 25 MG tablet  Take 25 mg by mouth daily after breakfast.     .  ferrous sulfate 325 (65 FE)   MG tablet  Take 1 tablet (325 mg total) by mouth daily with breakfast.  30 tablet  1   .  gabapentin (NEURONTIN) 300 MG capsule  Take 300 mg by mouth at bedtime.     .  oxyCODONE-acetaminophen (PERCOCET/ROXICET) 5-325 MG per tablet  Take 1-2 tablets by mouth every 6 (six) hours as needed. For pain  30 tablet  0   .  everolimus (AFINITOR) 5 MG tablet  Take 5 mg by mouth daily.      No current facility-administered medications for this visit.    Facility-Administered Medications Ordered in Other Visits   Medication  Dose  Route  Frequency  Provider  Last Rate  Last Dose   .  lidocaine-prilocaine (EMLA) cream   Topical  Once  Peter Rubin, MD     No Known Allergies  Review of Systems  Constitutional: Positive for activity change, appetite change and fatigue. Negative for fever and chills.  Eyes: Negative.  Respiratory: Positive for cough and shortness of breath.  Cardiovascular: Negative for chest pain and palpitations.  Gastrointestinal: Negative.  Genitourinary: Negative.  Musculoskeletal: Positive for gait problem.  Neurological: Positive for headaches.  Hematological: Negative.  Psychiatric/Behavioral: Negative.  BP  150/76  Pulse 90  Resp 20  Ht 5' 5" (1.651 m)  Wt 228 lb (103.42 kg)  BMI 37.94 kg/m2  SpO2 100%  Physical Exam  Constitutional: She is oriented to person, place, and time.  Obese woman in no distress. HENT:  Head: Normocephalic and atraumatic.  Mouth/Throat: Oropharynx is clear and moist.  Eyes: Conjunctivae normal and EOM are normal. Pupils are equal, round, and reactive to light.  Neck: Normal range of motion. Neck supple. No JVD present. No tracheal deviation present. No thyromegaly present.  Cardiovascular: Normal rate, regular rhythm, normal heart sounds and intact distal pulses.  No murmur heard.  Pulmonary/Chest: Effort normal. No respiratory distress.  Decreased breath sounds over RLL, left base.  S/p right MRM.  Abdominal: Soft. Bowel sounds are normal. She exhibits no distension and no mass. There is no tenderness.  Musculoskeletal: She exhibits no edema.  Lymphadenopathy:  She has no cervical adenopathy.  Neurological: She is alert and oriented to person, place, and time. She has normal strength. No cranial nerve deficit or sensory deficit.  Skin: Skin is warm and dry.  Psychiatric: She has a normal mood and affect.  Diagnostic Tests:  *RADIOLOGY REPORT*  Clinical Data: Pleural effusion, history of breast cancer, short of  breath  CHEST - 2 VIEW  Comparison: Prior chest x-ray 08/01/2012; most recent prior chest  CT 07/30/2012  Findings: Unchanged position of left subclavian approach single  lumen portacatheter. The catheter tip terminates in the upper  superior vena cava at its confluence with the left brachiocephalic  vein. Notably, the catheters oriented perpendicular to the  superior vena cava with the tip directed toward the lateral caval  wall.  Slight interval improvement in the appearance of the loculated  right pleural effusion and associated right basilar opacity.  Inspiratory volumes remain low. Trace left effusion persists and  may be incrementally  increased. Negative for pulmonary edema or  new airspace consolidation. Right to left displacement of the  tracheal air column consistent with known enlarged left thyroid  gland.  IMPRESSION:  1. Slightly improved loculated right pleural effusion and  associated right basilar opacity likely reflecting a combination of  pleural fluid with atelectasis and / or consolidation.  2. Small left pleural effusion is stable to slightly   increased in  size.  3. Unchanged position of left subclavian approach portacatheter.  The catheter tip is perpendicular to the SVC and likely abuts the  lateral caval wall.  Original Report Authenticated By: Heath McCullough, M.D.   Impression:   She has metastatic breast cancer with a malignant right pleural effusion status post recent right thoracentesis. She still has significant right pleural fluid present on chest x-ray with right lower lobe atelectasis which is likely responsible for her exertional dyspnea. There is also a small left pleural effusion which looks slightly larger than on her previous chest x-ray on 08/01/2012. I think that drainage of the residual right pleural effusion with a Pleurx catheter would give her the best chance of reexpanding the right lower lobe and improving her dyspnea on exertion. I think a left Pleurx catheter should be placed at the same time if the left pleural effusion can be accessed. I am concerned that if we do not place a left catheter at this time she will back in a couple weeks requiring a left Pleurx catheter for progressively increasing effusion. I discussed all this with her and her husband including alternatives, benefits, and risks including but not limited to bleeding, infection, injury to lung, pneumothorax, inability to completely drain the effusions, catheter malfunction, and recurrence of the effusions. She understands and wants to proceed as quickly as possible.   Plan:   I will schedule bilateral Pleurx catheter  insertion for tomorrow 08/08/2012.  

## 2012-08-08 ENCOUNTER — Ambulatory Visit (HOSPITAL_COMMUNITY): Payer: Medicare Other | Admitting: Critical Care Medicine

## 2012-08-08 ENCOUNTER — Encounter (HOSPITAL_COMMUNITY): Payer: Self-pay | Admitting: Critical Care Medicine

## 2012-08-08 ENCOUNTER — Encounter (HOSPITAL_COMMUNITY): Payer: Self-pay | Admitting: *Deleted

## 2012-08-08 ENCOUNTER — Ambulatory Visit (HOSPITAL_COMMUNITY)
Admission: RE | Admit: 2012-08-08 | Discharge: 2012-08-08 | Disposition: A | Discharge status: Home / Self Care | Payer: Medicare Other | Source: Ambulatory Visit | Attending: Surgery | Admitting: Surgery

## 2012-08-08 ENCOUNTER — Ambulatory Visit (HOSPITAL_COMMUNITY): Payer: Medicare Other

## 2012-08-08 ENCOUNTER — Encounter (HOSPITAL_COMMUNITY)
Admission: RE | Disposition: A | Discharge status: Home / Self Care | Payer: Self-pay | Source: Ambulatory Visit | Attending: Surgery

## 2012-08-08 DIAGNOSIS — I10 Essential (primary) hypertension: Secondary | ICD-10-CM | POA: Insufficient documentation

## 2012-08-08 DIAGNOSIS — Z87891 Personal history of nicotine dependence: Secondary | ICD-10-CM | POA: Insufficient documentation

## 2012-08-08 DIAGNOSIS — J90 Pleural effusion, not elsewhere classified: Secondary | ICD-10-CM

## 2012-08-08 DIAGNOSIS — M129 Arthropathy, unspecified: Secondary | ICD-10-CM | POA: Insufficient documentation

## 2012-08-08 DIAGNOSIS — C50919 Malignant neoplasm of unspecified site of unspecified female breast: Secondary | ICD-10-CM | POA: Insufficient documentation

## 2012-08-08 DIAGNOSIS — E785 Hyperlipidemia, unspecified: Secondary | ICD-10-CM | POA: Insufficient documentation

## 2012-08-08 DIAGNOSIS — Z90710 Acquired absence of both cervix and uterus: Secondary | ICD-10-CM | POA: Insufficient documentation

## 2012-08-08 DIAGNOSIS — H9190 Unspecified hearing loss, unspecified ear: Secondary | ICD-10-CM | POA: Insufficient documentation

## 2012-08-08 DIAGNOSIS — Z7982 Long term (current) use of aspirin: Secondary | ICD-10-CM | POA: Insufficient documentation

## 2012-08-08 DIAGNOSIS — Z85828 Personal history of other malignant neoplasm of skin: Secondary | ICD-10-CM | POA: Insufficient documentation

## 2012-08-08 DIAGNOSIS — J910 Malignant pleural effusion: Secondary | ICD-10-CM | POA: Insufficient documentation

## 2012-08-08 HISTORY — PX: CHEST TUBE INSERTION: SHX231

## 2012-08-08 HISTORY — DX: Shortness of breath: R06.02

## 2012-08-08 HISTORY — DX: Cardiac arrhythmia, unspecified: I49.9

## 2012-08-08 LAB — COMPREHENSIVE METABOLIC PANEL
AST: 86 U/L — ABNORMAL HIGH (ref 0–37)
Albumin: 2.8 g/dL — ABNORMAL LOW (ref 3.5–5.2)
Calcium: 9.7 mg/dL (ref 8.4–10.5)
Chloride: 103 mEq/L (ref 96–112)
Creatinine, Ser: 1.56 mg/dL — ABNORMAL HIGH (ref 0.50–1.10)
Sodium: 139 mEq/L (ref 135–145)
Total Bilirubin: 0.3 mg/dL (ref 0.3–1.2)

## 2012-08-08 LAB — PROTIME-INR: INR: 1.01 (ref 0.00–1.49)

## 2012-08-08 LAB — SURGICAL PCR SCREEN
MRSA, PCR: NEGATIVE
Staphylococcus aureus: NEGATIVE

## 2012-08-08 LAB — CBC
MCV: 85 fL (ref 78.0–100.0)
Platelets: 256 10*3/uL (ref 150–400)
RDW: 14.5 % (ref 11.5–15.5)
WBC: 4.9 10*3/uL (ref 4.0–10.5)

## 2012-08-08 SURGERY — INSERTION, PLEURAL DRAINAGE CATHETER
Anesthesia: Monitor Anesthesia Care | Site: Chest | Laterality: Right | Wound class: Clean Contaminated

## 2012-08-08 MED ORDER — CARVEDILOL 12.5 MG PO TABS
12.5000 mg | ORAL_TABLET | Freq: Two times a day (BID) | ORAL | Status: DC
  Administered 2012-08-08: 12.5 mg via ORAL
  Filled 2012-08-08: qty 1

## 2012-08-08 MED ORDER — ONDANSETRON HCL 4 MG/2ML IJ SOLN: 4.0000 mg | Freq: Four times a day (QID) | INTRAMUSCULAR | Status: DC | PRN

## 2012-08-08 MED ORDER — CARVEDILOL 12.5 MG PO TABS
ORAL_TABLET | ORAL | Status: AC
  Filled 2012-08-08: qty 1

## 2012-08-08 MED ORDER — 0.9 % SODIUM CHLORIDE (POUR BTL) OPTIME
TOPICAL | Status: DC | PRN
  Administered 2012-08-08: 1000 mL

## 2012-08-08 MED ORDER — SODIUM CHLORIDE 0.9 % IV SOLN
INTRAVENOUS | Status: DC | PRN
  Administered 2012-08-08: 08:00:00 via INTRAVENOUS

## 2012-08-08 MED ORDER — CARVEDILOL 6.25 MG PO TABS
6.2500 mg | ORAL_TABLET | Freq: Two times a day (BID) | ORAL | Status: DC
  Administered 2012-08-08: 6.25 mg via ORAL

## 2012-08-08 MED ORDER — OXYCODONE HCL 5 MG PO TABS
5.0000 mg | ORAL_TABLET | Freq: Once | ORAL | Status: AC | PRN
  Administered 2012-08-08: 5 mg via ORAL

## 2012-08-08 MED ORDER — MUPIROCIN 2 % EX OINT
TOPICAL_OINTMENT | CUTANEOUS | Status: AC
  Administered 2012-08-08: 1 via NASAL
  Filled 2012-08-08: qty 22

## 2012-08-08 MED ORDER — DEXTROSE 5 % IV SOLN
1.5000 g | INTRAVENOUS | Status: AC
  Administered 2012-08-08: 1.5 g via INTRAVENOUS
  Filled 2012-08-08: qty 1.5

## 2012-08-08 MED ORDER — OXYCODONE HCL 5 MG PO TABS
ORAL_TABLET | ORAL | Status: AC
  Filled 2012-08-08: qty 1

## 2012-08-08 MED ORDER — CARVEDILOL 3.125 MG PO TABS
ORAL_TABLET | ORAL | Status: AC
  Filled 2012-08-08: qty 2

## 2012-08-08 MED ORDER — FENTANYL CITRATE 0.05 MG/ML IJ SOLN
INTRAMUSCULAR | Status: DC | PRN
  Administered 2012-08-08 (×2): 25 ug via INTRAVENOUS
  Administered 2012-08-08: 50 ug via INTRAVENOUS

## 2012-08-08 MED ORDER — FENTANYL CITRATE 0.05 MG/ML IJ SOLN: 25.0000 ug | INTRAMUSCULAR | Status: DC | PRN

## 2012-08-08 MED ORDER — PROPOFOL 10 MG/ML IV BOLUS
INTRAVENOUS | Status: DC | PRN
  Administered 2012-08-08 (×10): 10 mg via INTRAVENOUS

## 2012-08-08 MED ORDER — MIDAZOLAM HCL 5 MG/5ML IJ SOLN
INTRAMUSCULAR | Status: DC | PRN
  Administered 2012-08-08: 2 mg via INTRAVENOUS

## 2012-08-08 MED ORDER — OXYCODONE HCL 5 MG/5ML PO SOLN: 5.0000 mg | Freq: Once | ORAL | Status: AC | PRN

## 2012-08-08 MED ORDER — ONDANSETRON HCL 4 MG/2ML IJ SOLN
INTRAMUSCULAR | Status: DC | PRN
  Administered 2012-08-08: 4 mg via INTRAVENOUS

## 2012-08-08 SURGICAL SUPPLY — 28 items
ADH SKN CLS APL DERMABOND .7 (GAUZE/BANDAGES/DRESSINGS) ×2
BRUSH SCRUB EZ PLAIN DRY (MISCELLANEOUS) ×3 IMPLANT
CANISTER SUCTION 2500CC (MISCELLANEOUS) ×2 IMPLANT
CLOTH BEACON ORANGE TIMEOUT ST (SAFETY) ×2 IMPLANT
COVER SURGICAL LIGHT HANDLE (MISCELLANEOUS) ×2 IMPLANT
DERMABOND ADVANCED (GAUZE/BANDAGES/DRESSINGS) ×2
DERMABOND ADVANCED .7 DNX12 (GAUZE/BANDAGES/DRESSINGS) ×1 IMPLANT
DRAPE C-ARM 42X72 X-RAY (DRAPES) ×2 IMPLANT
DRAPE LAPAROSCOPIC ABDOMINAL (DRAPES) ×3 IMPLANT
GLOVE BIOGEL PI IND STRL 6.5 (GLOVE) IMPLANT
GLOVE BIOGEL PI INDICATOR 6.5 (GLOVE) ×3
GLOVE EUDERMIC 7 POWDERFREE (GLOVE) ×2 IMPLANT
GLOVE SURG SS PI 6.5 STRL IVOR (GLOVE) ×1 IMPLANT
GOWN PREVENTION PLUS XLARGE (GOWN DISPOSABLE) ×2 IMPLANT
GOWN STRL NON-REIN LRG LVL3 (GOWN DISPOSABLE) ×2 IMPLANT
KIT BASIN OR (CUSTOM PROCEDURE TRAY) ×2 IMPLANT
KIT PLEURX DRAIN CATH 1000ML (MISCELLANEOUS) ×3 IMPLANT
KIT PLEURX DRAIN CATH 15.5FR (DRAIN) ×3 IMPLANT
KIT ROOM TURNOVER OR (KITS) ×2 IMPLANT
NS IRRIG 1000ML POUR BTL (IV SOLUTION) ×2 IMPLANT
PACK GENERAL/GYN (CUSTOM PROCEDURE TRAY) ×2 IMPLANT
PAD ARMBOARD 7.5X6 YLW CONV (MISCELLANEOUS) ×4 IMPLANT
SET DRAINAGE LINE (MISCELLANEOUS) ×2 IMPLANT
SUT ETHILON 3 0 PS 1 (SUTURE) ×3 IMPLANT
SUT VIC AB 3-0 X1 27 (SUTURE) ×3 IMPLANT
TOWEL OR 17X24 6PK STRL BLUE (TOWEL DISPOSABLE) ×2 IMPLANT
TOWEL OR 17X26 10 PK STRL BLUE (TOWEL DISPOSABLE) ×2 IMPLANT
WATER STERILE IRR 1000ML POUR (IV SOLUTION) ×1 IMPLANT

## 2012-08-08 NOTE — Transfer of Care (Signed)
Immediate Anesthesia Transfer of Care Note  Patient: Jeanette Morris  Procedure(s) Performed: Procedure(s) (LRB) with comments: INSERTION PLEURAL DRAINAGE CATHETER (Right)  Patient Location: PACU  Anesthesia Type:General  Level of Consciousness: awake, alert  and oriented  Airway & Oxygen Therapy: Patient Spontanous Breathing  Post-op Assessment: Report given to PACU RN, Post -op Vital signs reviewed and stable and Patient moving all extremities X 4  Post vital signs: Reviewed and stable  Complications: No apparent anesthesia complications

## 2012-08-08 NOTE — Preoperative (Signed)
Beta Blockers   Reason not to administer Beta Blockers:Not Applicable 

## 2012-08-08 NOTE — Anesthesia Procedure Notes (Signed)
Procedure Name: MAC Date/Time: 08/08/2012 8:30 AM Performed by: Elon Alas Pre-anesthesia Checklist: Patient identified, Timeout performed, Emergency Drugs available, Suction available and Patient being monitored Patient Re-evaluated:Patient Re-evaluated prior to inductionOxygen Delivery Method: Simple face mask Placement Confirmation: positive ETCO2 and breath sounds checked- equal and bilateral Dental Injury: Teeth and Oropharynx as per pre-operative assessment

## 2012-08-08 NOTE — Brief Op Note (Signed)
08/08/2012  9:41 AM  PATIENT:  Jeanette Morris  66 y.o. female  PRE-OPERATIVE DIAGNOSIS:  malignant pleural effusions  POST-OPERATIVE DIAGNOSIS:  malignant pleural effusions  PROCEDURE:  Procedure(s) (LRB) with comments: INSERTION PLEURAL DRAINAGE CATHETER (Right)  SURGEON:  Surgeon(s) and Role:    * Alleen Borne, MD - Primary  PHYSICIAN ASSISTANT: none  ASSISTANTS: none   ANESTHESIA:   local and MAC  EBL:  Total I/O In: 550 [I.V.:550] Out: 5 [Blood:5]  BLOOD ADMINISTERED:none  DRAINS: none   LOCAL MEDICATIONS USED:  LIDOCAINE   SPECIMEN:  No Specimen  DISPOSITION OF SPECIMEN:  N/A  COUNTS:  YES   PLAN OF CARE: Discharge to home after PACU  PATIENT DISPOSITION:  PACU - hemodynamically stable.   Delay start of Pharmacological VTE agent (>24hrs) due to surgical blood loss or risk of bleeding: not applicable

## 2012-08-08 NOTE — Anesthesia Preprocedure Evaluation (Addendum)
Anesthesia Evaluation  Patient identified by MRN, date of birth, ID band Patient awake    Reviewed: Allergy & Precautions, H&P , NPO status , Patient's Chart, lab work & pertinent test results, reviewed documented beta blocker date and time   Airway Mallampati: II  Neck ROM: full    Dental  (+) Dental Advisory Given   Pulmonary shortness of breath,  Pleural effusion         Cardiovascular hypertension, Pt. on home beta blockers  Cardiomyopathy secondary to chemotherapy   Neuro/Psych    GI/Hepatic   Endo/Other  obese  Renal/GU      Musculoskeletal  (+) Arthritis -,   Abdominal   Peds  Hematology  (+) Blood dyscrasia, anemia ,   Anesthesia Other Findings   Reproductive/Obstetrics                         Anesthesia Physical Anesthesia Plan  ASA: III  Anesthesia Plan: MAC   Post-op Pain Management:    Induction: Intravenous  Airway Management Planned: Simple Face Mask  Additional Equipment:   Intra-op Plan:   Post-operative Plan:   Informed Consent: I have reviewed the patients History and Physical, chart, labs and discussed the procedure including the risks, benefits and alternatives for the proposed anesthesia with the patient or authorized representative who has indicated his/her understanding and acceptance.   Dental advisory given  Plan Discussed with: Anesthesiologist and Surgeon  Anesthesia Plan Comments:         Anesthesia Quick Evaluation

## 2012-08-08 NOTE — Care Management Note (Signed)
    Page 1 of 1   08/08/2012     11:32:25 AM   CARE MANAGEMENT NOTE 08/08/2012  Patient:  Jeanette Morris, Jeanette Morris   Account Number:  [id number]  Date Initiated:  08/08/2012  Documentation initiated by:  Ronne Stefanski  Subjective/Objective Assessment:     Action/Plan:   Anticipated DC Date:  08/08/2012   Anticipated DC Plan:  HOME W HOME HEALTH SERVICES         Choice offered to / List presented to:  C-1 Patient        HH arranged  HH-1 RN      [facility] agency  [facility]   Status of service:   Medicare Important Message given?   (If response is "NO", the following Medicare IM given date fields will be blank) Date Medicare IM given:   Date Additional Medicare IM given:    Discharge Disposition:    Per UR Regulation:    If discussed at Long Length of Stay Meetings, dates discussed:    Comments:  Patient had a pleurX cath placed 08/08/12.  Home health set up with [facility] for services to start 08/09/12. Family educated on need to make contact with [facility] [employer] to ensure delivery of canisters, family was given [employer] phone #.  pt being discharged with one case of canisters today.

## 2012-08-08 NOTE — Anesthesia Postprocedure Evaluation (Signed)
Anesthesia Post Note  Patient: Jeanette Morris  Procedure(s) Performed: Procedure(s) (LRB): INSERTION PLEURAL DRAINAGE CATHETER (Right)  Anesthesia type: MAC  Patient location: PACU  Post pain: Pain level controlled and Adequate analgesia  Post assessment: Post-op Vital signs reviewed, Patient's Cardiovascular Status Stable and Respiratory Function Stable  Last Vitals:  Filed Vitals:   08/08/12 1000  BP: 121/75  Pulse:   Temp:   Resp: 22    Post vital signs: Reviewed and stable  Level of consciousness: awake, alert  and oriented  Complications: No apparent anesthesia complications

## 2012-08-08 NOTE — H&P (Signed)
[address]            [place] [address]          [phone]       PCP is [name], Joelene Millin, NP  Referring Provider is Renae Fickle, MD  Chief Complaint   Patient presents with   .  Pleural Effusion     Surgical eval with CXR, I/P consult from 08/02/2012   HPI:  The patient is an 66 year old black female with a history of inflammatory carcinoma of the right breast, status post right modified radical mastectomy with axillary node dissection and chemotherapy who had contralateral axillary recurrence. Her tumor is HER-2 negative. 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 She presented to the emergency room last week with shortness of breath that started after Thanksgiving. Chest x-ray and CT scan showed a large right pleural effusion with collapse of the right lung. She had thoracentesis performed removing 1.6 L of serous fluid. 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 Followup chest x-ray showed a small to moderate sized residual right pleural effusion and small left pleural effusion. Her shortness of breath was significantly improved after thoracentesis. I saw her initially in the hospital and arranged for her followup in the office today. She said that at rest her breathing is pretty good but when she is ambulatory she get short of breath.  Past Medical History   Diagnosis  Date   .  Hypertension    .  Hearing loss      right ear   .  Hyperlipidemia    .  Arthritis    .  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  Breast cancer      (Rt) breast ca dx 9/11    Past Surgical History   Procedure  Date   .  Tubal ligation    .  Portacath placement    .  Abdominal hysterectomy    .  Lung biopsy    .  Lesion excision      L anterior thigh   .  Breast surgery  12/01/10     ER+,PR+,Ki 67 64%,Her2 -; right breast lumpectomy   History reviewed. No pertinent family history.  Social History  History     Substance Use Topics   .  Smoking status:  Former Smoker     Quit date:  06/30/1973   .  Smokeless tobacco:  Never Used   .  Alcohol Use:  No    Current Outpatient Prescriptions   Medication  Sig  Dispense  Refill   .  aspirin 81 MG chewable tablet  Chew 81 mg by mouth daily.     .  Calcium Carbonate-Vitamin D 600-400 MG-UNIT per tablet  Take 1 tablet by mouth every morning.     .  carvedilol (COREG) 12.5 MG tablet  Take 12.5 mg by mouth every morning. Take With 6.25 mg tablet to make a total strength of 18.75mg      .  carvedilol (COREG) 6.25 MG tablet  Take 6.25 mg by mouth every morning. Take With 12.5 mg tablet to make a total strength of 18.75mg      .  exemestane (AROMASIN) 25 MG tablet  Take 25 mg by mouth daily after breakfast.     .  ferrous sulfate 325 (65 FE)  MG tablet  Take 1 tablet (325 mg total) by mouth daily with breakfast.  30 tablet  1   .  gabapentin (NEURONTIN) 300 MG capsule  Take 300 mg by mouth at bedtime.     Marland Kitchen  oxyCODONE-acetaminophen (PERCOCET/ROXICET) 5-325 MG per tablet  Take 1-2 tablets by mouth every 6 (six) hours as needed. For pain  30 tablet  0   .  everolimus (AFINITOR) 5 MG tablet  Take 5 mg by mouth daily.      No current facility-administered medications for this visit.    Facility-Administered Medications Ordered in Other Visits   Medication  Dose  Route  Frequency  Provider  Last Rate  Last Dose   .  lidocaine-prilocaine (EMLA) cream   Topical  Once  Pierce Crane, MD     No Known Allergies  Review of Systems  Constitutional: Positive for activity change, appetite change and fatigue. Negative for fever and chills.  Eyes: Negative.  Respiratory: Positive for cough and shortness of breath.  Cardiovascular: Negative for chest pain and palpitations.  Gastrointestinal: Negative.  Genitourinary: Negative.  Musculoskeletal: Positive for gait problem.  Neurological: Positive for headaches.  Hematological: Negative.  Psychiatric/Behavioral: Negative.  BP  150/76  Pulse 90  Resp 20  Ht 5\' 5"  (1.651 m)  Wt 228 lb (103.42 kg)  BMI 37.94 kg/m2  SpO2 100%  Physical Exam  Constitutional: She is oriented to person, place, and time.  Obese woman in no distress. HENT:  Head: Normocephalic and atraumatic.  Mouth/Throat: Oropharynx is clear and moist.  Eyes: Conjunctivae normal and EOM are normal. Pupils are equal, round, and reactive to light.  Neck: Normal range of motion. Neck supple. No JVD present. No tracheal deviation present. No thyromegaly present.  Cardiovascular: Normal rate, regular rhythm, normal heart sounds and intact distal pulses.  No murmur heard.  Pulmonary/Chest: Effort normal. No respiratory distress.  Decreased breath sounds over RLL, left base.  S/p right MRM.  Abdominal: Soft. Bowel sounds are normal. She exhibits no distension and no mass. There is no tenderness.  Musculoskeletal: She exhibits no edema.  Lymphadenopathy:  She has no cervical adenopathy.  Neurological: She is alert and oriented to person, place, and time. She has normal strength. No cranial nerve deficit or sensory deficit.  Skin: Skin is warm and dry.  Psychiatric: She has a normal mood and affect.  Diagnostic Tests:  *RADIOLOGY REPORT*  Clinical Data: Pleural effusion, history of breast cancer, short of  breath  CHEST - 2 VIEW  Comparison: Prior chest x-ray 08/01/2012; most recent prior chest  CT 07/30/2012  Findings: Unchanged position of left subclavian approach single  lumen portacatheter. The catheter tip terminates in the upper  superior vena cava at its confluence with the left brachiocephalic  vein. Notably, the catheters oriented perpendicular to the  superior vena cava with the tip directed toward the lateral caval  wall.  Slight interval improvement in the appearance of the loculated  right pleural effusion and associated right basilar opacity.  Inspiratory volumes remain low. Trace left effusion persists and  may be incrementally  increased. Negative for pulmonary edema or  new airspace consolidation. Right to left displacement of the  tracheal air column consistent with known enlarged left thyroid  gland.  IMPRESSION:  1. Slightly improved loculated right pleural effusion and  associated right basilar opacity likely reflecting a combination of  pleural fluid with atelectasis and / or consolidation.  2. Small left pleural effusion is stable to slightly  increased in  size.  3. Unchanged position of left subclavian approach portacatheter.  The catheter tip is perpendicular to the SVC and likely abuts the  lateral caval wall.  Original Report Authenticated By: Malachy Moan, M.D.   Impression:   She has metastatic breast cancer with a malignant right pleural effusion status post recent right thoracentesis. She still has significant right pleural fluid present on chest x-ray with right lower lobe atelectasis which is likely responsible for her exertional dyspnea. There is also a small left pleural effusion which looks slightly larger than on her previous chest x-ray on 08/01/2012. I think that drainage of the residual right pleural effusion with a Pleurx catheter would give her the best chance of reexpanding the right lower lobe and improving her dyspnea on exertion. I think a left Pleurx catheter should be placed at the same time if the left pleural effusion can be accessed. I am concerned that if we do not place a left catheter at this time she will back in a couple weeks requiring a left Pleurx catheter for progressively increasing effusion. I discussed all this with her and her husband including alternatives, benefits, and risks including but not limited to bleeding, infection, injury to lung, pneumothorax, inability to completely drain the effusions, catheter malfunction, and recurrence of the effusions. She understands and wants to proceed as quickly as possible.   Plan:   I will schedule bilateral Pleurx catheter  insertion for tomorrow 08/08/2012.

## 2012-08-08 NOTE — OR Nursing (Signed)
1% Lidocaine plain from pleurx kit, 15 mls injected at site, bilateral, exp: 01/28/2015

## 2012-08-08 NOTE — Interval H&P Note (Signed)
History and Physical Interval Note:  08/08/2012 8:04 AM  Jeanette Morris  has presented today for surgery, with the diagnosis of malignant pleural effusions  The various methods of treatment have been discussed with the patient and family. After consideration of risks, benefits and other options for treatment, the patient has consented to  Procedure(s) (LRB) with comments: INSERTION PLEURAL DRAINAGE CATHETER (Bilateral) as a surgical intervention .  The patient's history has been reviewed, patient examined, no change in status, stable for surgery.  I have reviewed the patient's chart and labs.  Questions were answered to the patient's satisfaction.     Alleen Borne

## 2012-08-08 NOTE — Op Note (Signed)
NAMESINA, SUMPTER               ACCOUNT NO.:  [id number]  MEDICAL RECORD NO.:  [id number]  LOCATION:  [facility]                         FACILITY:  [facility]  PHYSICIAN:  Evelene Croon, M.D.     DATE OF BIRTH:  1945/10/14  DATE OF PROCEDURE:  08/08/2012 DATE OF DISCHARGE:  08/08/2012                              OPERATIVE REPORT   PREOPERATIVE DIAGNOSIS:  Metastatic breast cancer with malignant right pleural effusion.  POSTOPERATIVE DIAGNOSIS:  Metastatic breast cancer with malignant right pleural effusion.  OPERATIVE PROCEDURE:  Insertion of right PleurX catheter.  ATTENDING SURGEON:  Evelene Croon, M.D.  ANESTHESIA:  MAC with local.  CLINICAL HISTORY:  This patient is a 66 year old woman with metastatic breast cancer with a malignant right pleural effusion.  She had thoracentesis about a week ago, draining 1.6 liters of fluid, but had persistent moderate-sized right pleural effusion on chest x-ray with shortness of breath.  I saw her in the office yesterday and followup chest x-ray showed that she still had moderate-sized right pleural effusion.  She was getting short of breath with ambulation.  There was also a small left pleural effusion that the radiologist felt may be slightly larger than on previous chest x-ray.  I felt the best option was to insert a right PleurX catheter to drain the malignant right pleural effusion.  I told her I would also try to insert a left PleurX catheter if I could access that pleural effusion to prevent progressive increase in that.  I discussed the operative procedure with her and her husband including alternatives, benefits, and risks including, but not limited to, bleeding, infection, pneumothorax, injury to the lung, catheter malfunction, and recurrence of the effusions.  She understood this and agreed to proceed.  OPERATIVE PROCEDURE:  The patient was seen in the preoperative holding area, and proper patient and proper operation were confirmed.   Consent was signed.  She was taken back to the operating room and placed on the table in supine position.  Preoperative intravenous Ancef was given. After some intravenous sedation was given by anesthesia, the chest was prepped with Betadine soap and solution, draped in usual sterile manner. Time-out was taken.  Proper patient and proper operation were confirmed with nursing and anesthesia staff.  I inserted the right PleurX catheter first.  The chest entry site was located in the midaxillary line at about the eighth intercostal space.  The skin and subcutaneous tissue over this area was anesthetized with 1% lidocaine local anesthesia. Lidocaine was infiltrated down to the intercostal muscle and then the right pleural space was entered with the needle and serosanguineous fluid removed.  Then, a small stab incision was made at this location and the needle catheter was inserted into the right pleural space without difficulty.  There was return of serosanguineous fluid.  The needle was removed and through the catheter, a guidewire was inserted without difficulty.  The catheter was removed.  Then, the skin exit site was chosen in the anterior axillary line just above the right costal margin.  Lidocaine 1% local anesthesia was used to anesthetize the site and the subcutaneous tunnel between the two incisions was anesthetized in a similar manner.  Then,  a small stab incision was made at the skin exit site.  This was gently spread with hemostat.  Then, the catheter was cut to the appropriate length and passed through the tunnel using trocar.  It was positioned, so that the cuff was lined just inside the skin exit site.  Then, the introducer and sheath were inserted over the guidewire and advanced into the pleural space without difficulty.  The guidewire and introducer were removed and through this sheath, the catheter was inserted into the pleural space.  The peel-away sheath was removed.  The  catheter was connected to the vacuum bottle suction and about 1100 mL of serosanguineous fluid was removed.  The catheter was then capped.  It was fixed to the skin with a nylon suture and the chest wall entry site was closed with a single 3-0 Vicryl subcuticular stitch. The sponge, needle, and instrument counts were correct according to the scrub nurse.  Dry sterile dressing was applied over this site.  Then, I attempted to insert a left PleurX catheter.  The chest wall entry site was located in the midaxillary line about the eighth intercostal space.  Lidocaine 1% local anesthesia was infiltrated into the skin and subcutaneous tissue down to the intercostal space.  I tried in several locations and was not able to access any pleural fluid.  It was somewhat more difficult due to her obesity.  Since I could not access pleural fluid in any of the usual locations, I felt it would best to abort trying to insert a left PleurX catheter.  The patient was then transported to the postanesthesia care unit in satisfactory and stable condition.     Evelene Croon, M.D.     BB/MEDQ  D:  08/08/2012  T:  08/08/2012  Job:  [id number]

## 2012-08-10 ENCOUNTER — Encounter (HOSPITAL_COMMUNITY): Payer: Self-pay | Admitting: Surgery

## 2012-08-14 ENCOUNTER — Telehealth: Payer: Self-pay | Admitting: *Deleted

## 2012-08-14 ENCOUNTER — Ambulatory Visit: Payer: PRIVATE HEALTH INSURANCE | Admitting: Oncology

## 2012-08-14 ENCOUNTER — Other Ambulatory Visit (HOSPITAL_BASED_OUTPATIENT_CLINIC_OR_DEPARTMENT_OTHER): Payer: Medicare Other

## 2012-08-14 ENCOUNTER — Ambulatory Visit (INDEPENDENT_AMBULATORY_CARE_PROVIDER_SITE_OTHER): Payer: PRIVATE HEALTH INSURANCE | Admitting: General Surgery

## 2012-08-14 ENCOUNTER — Encounter (INDEPENDENT_AMBULATORY_CARE_PROVIDER_SITE_OTHER): Payer: Self-pay | Admitting: General Surgery

## 2012-08-14 ENCOUNTER — Telehealth: Payer: Self-pay | Admitting: Oncology

## 2012-08-14 VITALS — BP 118/70 | HR 90 | Temp 98.5°F | Resp 24 | Ht 65.0 in | Wt 240.4 lb

## 2012-08-14 VITALS — BP 144/85 | HR 87 | Temp 98.6°F | Resp 20 | Ht 65.0 in | Wt 240.3 lb

## 2012-08-14 DIAGNOSIS — J910 Malignant pleural effusion: Secondary | ICD-10-CM

## 2012-08-14 DIAGNOSIS — E559 Vitamin D deficiency, unspecified: Secondary | ICD-10-CM

## 2012-08-14 DIAGNOSIS — C50919 Malignant neoplasm of unspecified site of unspecified female breast: Secondary | ICD-10-CM

## 2012-08-14 DIAGNOSIS — C50912 Malignant neoplasm of unspecified site of left female breast: Secondary | ICD-10-CM

## 2012-08-14 LAB — CBC WITH DIFFERENTIAL/PLATELET
Basophils Absolute: 0 10*3/uL (ref 0.0–0.1)
Eosinophils Absolute: 0.1 10*3/uL (ref 0.0–0.5)
HGB: 8.1 g/dL — ABNORMAL LOW (ref 11.6–15.9)
MONO#: 0.5 10*3/uL (ref 0.1–0.9)
NEUT#: 3.8 10*3/uL (ref 1.5–6.5)
RBC: 2.82 10*6/uL — ABNORMAL LOW (ref 3.70–5.45)
RDW: 16.4 % — ABNORMAL HIGH (ref 11.2–14.5)
WBC: 5 10*3/uL (ref 3.9–10.3)

## 2012-08-14 LAB — COMPREHENSIVE METABOLIC PANEL (CC13)
ALT: 26 U/L (ref 0–55)
AST: 19 U/L (ref 5–34)
Albumin: 2.4 g/dL — ABNORMAL LOW (ref 3.5–5.0)
Alkaline Phosphatase: 68 U/L (ref 40–150)
Chloride: 101 mEq/L (ref 98–107)
Potassium: 4.3 mEq/L (ref 3.5–5.1)
Sodium: 139 mEq/L (ref 136–145)
Total Protein: 6.4 g/dL (ref 6.4–8.3)

## 2012-08-14 LAB — CANCER ANTIGEN 27.29: CA 27.29: 95 U/mL — ABNORMAL HIGH (ref 0–39)

## 2012-08-14 LAB — CEA: CEA: 76.2 ng/mL — ABNORMAL HIGH (ref 0.0–5.0)

## 2012-08-14 LAB — LACTATE DEHYDROGENASE (CC13): LDH: 151 U/L (ref 125–245)

## 2012-08-14 MED ORDER — PROCHLORPERAZINE 25 MG RE SUPP: 25.0000 mg | Freq: Two times a day (BID) | RECTAL | Status: DC | PRN

## 2012-08-14 MED ORDER — PROCHLORPERAZINE MALEATE 10 MG PO TABS: 10.0000 mg | ORAL_TABLET | Freq: Four times a day (QID) | ORAL | Status: DC | PRN

## 2012-08-14 MED ORDER — ONDANSETRON HCL 8 MG PO TABS: 8.0000 mg | ORAL_TABLET | Freq: Two times a day (BID) | ORAL | Status: DC

## 2012-08-14 NOTE — Telephone Encounter (Signed)
Gave patient appointment for 08-16-2012  Gave patient appointment for 08-23-2012 starting at 10:00am  [facility] email to set up treatment

## 2012-08-14 NOTE — Telephone Encounter (Signed)
Dr. Carolynne Edouard called me this am and requested to have this patient seen ASAP by a med onc- because her disease is progressing on her current therapy.  I left a message asking her to call me back so we could schedule.

## 2012-08-14 NOTE — Telephone Encounter (Signed)
Called pt and requested for her to please come in at 2:45 so we can do labs on her and she is fine w/ that. Also, obtained a new cell number.

## 2012-08-14 NOTE — Patient Instructions (Signed)
Will arrange for follow up with oncology

## 2012-08-14 NOTE — Progress Notes (Signed)
Subjective:     Patient ID: Jeanette Morris, female   DOB: [birth year], 66 y.o.   MRN: [id number]  HPI The patient is a 66 year old black female who is about a year and 9 months status post right mastectomy and axillary node dissection for a TxN3 right breast cancer. She now has a malignant right pleural effusion, locally advanced disease in the left breast, and suspicious supraclavicular  Lymphadenopathy. She is being treated with aromatase inhibitor is and she feels as though the fullness in the left breast is getting larger.  Review of Systems  Constitutional: Positive for fatigue.  HENT: Negative.   Eyes: Negative.   Respiratory: Positive for shortness of breath.   Cardiovascular: Negative.   Gastrointestinal: Negative.   Genitourinary: Negative.   Musculoskeletal: Negative.   Skin: Negative.   Neurological: Negative.   Hematological: Negative.   Psychiatric/Behavioral: Negative.        Objective:   Physical Exam  Constitutional: She is oriented to person, place, and time. She appears well-developed and well-nourished.  HENT:  Head: Normocephalic and atraumatic.  Eyes: Conjunctivae normal and EOM are normal. Pupils are equal, round, and reactive to light.  Neck: Normal range of motion. Neck supple.       There is enlarged left supraclavicular lymphadenopathy  Cardiovascular: Normal rate, regular rhythm and normal heart sounds.   Pulmonary/Chest: Effort normal and breath sounds normal.       She has a large palpable mass in the upper outer quadrant of the left breast and left axilla. There are peau d'orange changes in the lateral left breast. She has a new right pleural drainage catheter.  Abdominal: Soft. Bowel sounds are normal.  Musculoskeletal: Normal range of motion.  Neurological: She is alert and oriented to person, place, and time.  Skin: Skin is warm and dry.  Psychiatric: She has a normal mood and affect. Her behavior is normal.       Assessment:     The patient  has stage IV breast cancer at this point. Unfortunately the situation is not curable. From a surgical standpoint the question is whether she would benefit from a left mastectomy and axillary lymph node dissection. I think it is questionable whether we could do this and get clean margins.    Plan:     We will arrange for her return to the medical oncologist to see if there is any other treatment that she could tolerate that might better get the tumor under control. We will plan to see her back in one month.

## 2012-08-14 NOTE — Progress Notes (Signed)
Subjective:     Patient ID: Jeanette Morris, female   DOB: Sep 28, 1945, 66 y.o.   MRN: [id number]  HPI The patient is a 66 year old black female who is 66 year and a half status post right modified radical mastectomy for a TxN3 right breast cancer. She continues to have some soreness of the right chest wall. Otherwise her condition is unchanged. She denies any shortness of breath. Her appetite is good and her bowels are working normally.  Review of Systems  Constitutional: Negative.   HENT: Negative.   Eyes: Negative.   Respiratory: Negative.   Cardiovascular: Negative.   Gastrointestinal: Negative.   Genitourinary: Negative.   Musculoskeletal: Negative.   Skin: Negative.   Neurological: Negative.   Hematological: Negative.   Psychiatric/Behavioral: Negative.        Objective:   Physical Exam  Constitutional: She is oriented to person, place, and time. She appears well-developed and well-nourished.  HENT:  Head: Normocephalic and atraumatic.  Eyes: Conjunctivae normal and EOM are normal. Pupils are equal, round, and reactive to light.  Neck: Normal range of motion. Neck supple.       She has enlarged and palpable left axillary and supraclavicular lymph nodes  Cardiovascular: Normal rate, regular rhythm and normal heart sounds.   Pulmonary/Chest: Effort normal and breath sounds normal.       She has a large mass in the upper outer quadrant of the left breast and axilla  Abdominal: Soft. Bowel sounds are normal.  Musculoskeletal: Normal range of motion.  Neurological: She is alert and oriented to person, place, and time.  Skin: Skin is warm and dry.  Psychiatric: She has a normal mood and affect. Her behavior is normal.       Assessment:     At this point the patient appears to have metastatic breast cancer. She is taking an aromatase inhibitor and seems to be tolerating it well. The question will be whether the aromatase inhibitor will be able to control the tumor and  whether she would benefit from a left modified radical mastectomy.    Plan:     We will have her return to the oncologists to help answer his questions. We will plan to see her back in one month

## 2012-08-15 ENCOUNTER — Telehealth: Payer: Self-pay | Admitting: *Deleted

## 2012-08-15 NOTE — Telephone Encounter (Signed)
Per staff message and POF patient needs treatment appt to follow MD visit on 12/26. No openings within two hrs of MD visit. Sent message back to scheduler to advise new appt. JMW

## 2012-08-16 ENCOUNTER — Telehealth: Payer: Self-pay | Admitting: *Deleted

## 2012-08-16 ENCOUNTER — Ambulatory Visit (HOSPITAL_BASED_OUTPATIENT_CLINIC_OR_DEPARTMENT_OTHER): Payer: PRIVATE HEALTH INSURANCE

## 2012-08-16 ENCOUNTER — Other Ambulatory Visit: Payer: Self-pay | Admitting: *Deleted

## 2012-08-16 VITALS — BP 124/68 | HR 83 | Temp 98.3°F | Resp 20

## 2012-08-16 DIAGNOSIS — C50912 Malignant neoplasm of unspecified site of left female breast: Secondary | ICD-10-CM

## 2012-08-16 DIAGNOSIS — J910 Malignant pleural effusion: Secondary | ICD-10-CM

## 2012-08-16 DIAGNOSIS — C50919 Malignant neoplasm of unspecified site of unspecified female breast: Secondary | ICD-10-CM

## 2012-08-16 DIAGNOSIS — Z5111 Encounter for antineoplastic chemotherapy: Secondary | ICD-10-CM

## 2012-08-16 MED ORDER — SODIUM CHLORIDE 0.9 % IJ SOLN
10.0000 mL | INTRAMUSCULAR | Status: DC | PRN
  Administered 2012-08-16: 10 mL
  Filled 2012-08-16: qty 10

## 2012-08-16 MED ORDER — SODIUM CHLORIDE 0.9 % IV SOLN
Freq: Once | INTRAVENOUS | Status: AC
  Administered 2012-08-16: 09:00:00 via INTRAVENOUS

## 2012-08-16 MED ORDER — LIDOCAINE-PRILOCAINE 2.5-2.5 % EX CREA: TOPICAL_CREAM | CUTANEOUS | Status: DC | PRN

## 2012-08-16 MED ORDER — HEPARIN SOD (PORK) LOCK FLUSH 100 UNIT/ML IV SOLN
500.0000 [IU] | Freq: Once | INTRAVENOUS | Status: AC | PRN
  Administered 2012-08-16: 500 [IU]
  Filled 2012-08-16: qty 5

## 2012-08-16 MED ORDER — PACLITAXEL PROTEIN-BOUND CHEMO INJECTION 100 MG
100.0000 mg/m2 | Freq: Once | INTRAVENOUS | Status: AC
  Administered 2012-08-16: 225 mg via INTRAVENOUS
  Filled 2012-08-16: qty 45

## 2012-08-16 MED ORDER — DEXAMETHASONE SODIUM PHOSPHATE 10 MG/ML IJ SOLN
10.0000 mg | Freq: Once | INTRAMUSCULAR | Status: AC
  Administered 2012-08-16: 10 mg via INTRAVENOUS

## 2012-08-16 MED ORDER — ONDANSETRON 8 MG/50ML IVPB (CHCC)
8.0000 mg | Freq: Once | INTRAVENOUS | Status: AC
  Administered 2012-08-16: 8 mg via INTRAVENOUS

## 2012-08-16 NOTE — Telephone Encounter (Signed)
I have sccheduled appts.  JMW

## 2012-08-16 NOTE — Patient Instructions (Addendum)
[facility] Discharge Instructions for Patients Receiving Chemotherapy  Today you received the following chemotherapy agents Abraxane.  To help prevent nausea and vomiting after your treatment, we encourage you to take your nausea medication as directed.   If you develop nausea and vomiting that is not controlled by your nausea medication, call the clinic. If it is after clinic hours your family physician or the after hours number for the clinic or go to the Emergency Department.   BELOW ARE SYMPTOMS THAT SHOULD BE REPORTED IMMEDIATELY:  *FEVER GREATER THAN 100.5 F  *CHILLS WITH OR WITHOUT FEVER  NAUSEA AND VOMITING THAT IS NOT CONTROLLED WITH YOUR NAUSEA MEDICATION  *UNUSUAL SHORTNESS OF BREATH  *UNUSUAL BRUISING OR BLEEDING  TENDERNESS IN MOUTH AND THROAT WITH OR WITHOUT PRESENCE OF ULCERS  *URINARY PROBLEMS  *BOWEL PROBLEMS  UNUSUAL RASH Items with * indicate a potential emergency and should be followed up as soon as possible.  One of the nurses will contact you 24 hours after your treatment. Please let the nurse know about any problems that you may have experienced. Feel free to call the clinic you have any questions or concerns. The clinic phone number is [phone].     (Abraxane) Nanoparticle Albumin-Bound Paclitaxel injection What is this medicine? NANOPARTICLE ALBUMIN-BOUND PACLITAXEL (Na no PAHR ti kuhl al BYOO muhn-bound PAK li TAX el) is a chemotherapy drug. It targets fast dividing cells, like cancer cells, and causes these cells to die. This medicine is used to treat advanced breast cancer and advanced lung cancer. This medicine may be used for other purposes; ask your health care provider or pharmacist if you have questions. What should I tell my health care provider before I take this medicine? They need to know if you have any of these conditions: -kidney disease -liver disease -low blood counts, like low platelets, red blood cells, or  white blood cells -recent or ongoing radiation therapy -an unusual or allergic reaction to paclitaxel, albumin, other chemotherapy, other medicines, foods, dyes, or preservatives -pregnant or trying to get pregnant -breast-feeding How should I use this medicine? This drug is given as an infusion into a vein. It is administered in a hospital or clinic by a specially trained health care professional. Talk to your pediatrician regarding the use of this medicine in children. Special care may be needed. Overdosage: If you think you have taken too much of this medicine contact a poison control center or emergency room at once. NOTE: This medicine is only for you. Do not share this medicine with others. What if I miss a dose? It is important not to miss your dose. Call your doctor or health care professional if you are unable to keep an appointment. What may interact with this medicine? This medicine may also interact with the following medications: -cyclosporine -dexamethasone -diazepam -ketoconazole -medicines to increase blood counts like filgrastim, pegfilgrastim, sargramostim -other chemotherapy drugs like cisplatin, doxorubicin, epirubicin, etoposide, teniposide, vincristine -quinidine -testosterone -vaccines -verapamil Talk to your doctor or health care professional before taking any of these medicines: -acetaminophen -aspirin -ibuprofen -ketoprofen -naproxen This list may not describe all possible interactions. Give your health care provider a list of all the medicines, herbs, non-prescription drugs, or dietary supplements you use. Also tell them if you smoke, drink alcohol, or use illegal drugs. Some items may interact with your medicine. What should I watch for while using this medicine? Your condition will be monitored carefully while you are receiving this medicine. You will need important  blood work done while you are taking this medicine. This drug may make you feel generally  unwell. This is not uncommon, as chemotherapy can affect healthy cells as well as cancer cells. Report any side effects. Continue your course of treatment even though you feel ill unless your doctor tells you to stop. In some cases, you may be given additional medicines to help with side effects. Follow all directions for their use. Call your doctor or health care professional for advice if you get a fever, chills or sore throat, or other symptoms of a cold or flu. Do not treat yourself. This drug decreases your body's ability to fight infections. Try to avoid being around people who are sick. This medicine may increase your risk to bruise or bleed. Call your doctor or health care professional if you notice any unusual bleeding. Be careful brushing and flossing your teeth or using a toothpick because you may get an infection or bleed more easily. If you have any dental work done, tell your dentist you are receiving this medicine. Avoid taking products that contain aspirin, acetaminophen, ibuprofen, naproxen, or ketoprofen unless instructed by your doctor. These medicines may hide a fever. Do not become pregnant while taking this medicine. Women should inform their doctor if they wish to become pregnant or think they might be pregnant. There is a potential for serious side effects to an unborn child. Talk to your health care professional or pharmacist for more information. Do not breast-feed an infant while taking this medicine. Men are advised not to father a child while receiving this medicine. What side effects may I notice from receiving this medicine? Side effects that you should report to your doctor or health care professional as soon as possible: -allergic reactions like skin rash, itching or hives, swelling of the face, lips, or tongue -low blood counts - This drug may decrease the number of white blood cells, red blood cells and platelets. You may be at increased risk for infections and  bleeding. -signs of infection - fever or chills, cough, sore throat, pain or difficulty passing urine -signs of decreased platelets or bleeding - bruising, pinpoint red spots on the skin, black, tarry stools, nosebleeds -signs of decreased red blood cells - unusually weak or tired, fainting spells, lightheadedness -breathing problems -changes in vision -chest pain -high or low blood pressure -mouth sores -nausea and vomiting -pain, swelling, redness or irritation at the injection site -pain, tingling, numbness in the hands or feet -slow or irregular heartbeat -swelling of the ankle, feet, hands Side effects that usually do not require medical attention (report to your doctor or health care professional if they continue or are bothersome): -aches, pains -changes in the color of fingernails -diarrhea -hair loss -loss of appetite This list may not describe all possible side effects. Call your doctor for medical advice about side effects. You may report side effects to FDA at 1-800-FDA-1088. Where should I keep my medicine? This drug is given in a hospital or clinic and will not be stored at home. NOTE: This sheet is a summary. It may not cover all possible information. If you have questions about this medicine, talk to your doctor, pharmacist, or health care provider.  2013, [employer]. (06/20/2011 4:13:49 PM)

## 2012-08-17 ENCOUNTER — Telehealth: Payer: Self-pay | Admitting: Medical Oncology

## 2012-08-17 NOTE — Telephone Encounter (Signed)
She is in " good spirits , feel good" She denies any side effects from chemotherapy. I told her to call if she has any problems or concerns.

## 2012-08-23 ENCOUNTER — Other Ambulatory Visit (HOSPITAL_COMMUNITY): Payer: Self-pay | Admitting: *Deleted

## 2012-08-23 ENCOUNTER — Ambulatory Visit (HOSPITAL_BASED_OUTPATIENT_CLINIC_OR_DEPARTMENT_OTHER): Payer: Medicaid Other

## 2012-08-23 ENCOUNTER — Other Ambulatory Visit (HOSPITAL_BASED_OUTPATIENT_CLINIC_OR_DEPARTMENT_OTHER): Payer: Medicare Other | Admitting: Lab

## 2012-08-23 ENCOUNTER — Ambulatory Visit (HOSPITAL_BASED_OUTPATIENT_CLINIC_OR_DEPARTMENT_OTHER): Payer: Medicare Other | Admitting: Oncology

## 2012-08-23 VITALS — BP 154/81 | HR 95 | Temp 98.1°F | Resp 20 | Ht 65.0 in | Wt 234.6 lb

## 2012-08-23 DIAGNOSIS — C50919 Malignant neoplasm of unspecified site of unspecified female breast: Secondary | ICD-10-CM

## 2012-08-23 DIAGNOSIS — J910 Malignant pleural effusion: Secondary | ICD-10-CM

## 2012-08-23 DIAGNOSIS — Z5111 Encounter for antineoplastic chemotherapy: Secondary | ICD-10-CM

## 2012-08-23 DIAGNOSIS — Z853 Personal history of malignant neoplasm of breast: Secondary | ICD-10-CM

## 2012-08-23 DIAGNOSIS — C50619 Malignant neoplasm of axillary tail of unspecified female breast: Secondary | ICD-10-CM

## 2012-08-23 DIAGNOSIS — Z170 Estrogen receptor positive status [ER+]: Secondary | ICD-10-CM

## 2012-08-23 DIAGNOSIS — C50912 Malignant neoplasm of unspecified site of left female breast: Secondary | ICD-10-CM

## 2012-08-23 LAB — COMPREHENSIVE METABOLIC PANEL (CC13)
ALT: 17 U/L (ref 0–55)
AST: 19 U/L (ref 5–34)
Alkaline Phosphatase: 60 U/L (ref 40–150)
Chloride: 105 mEq/L (ref 98–107)
Creatinine: 1.8 mg/dL — ABNORMAL HIGH (ref 0.6–1.1)
Total Bilirubin: 0.37 mg/dL (ref 0.20–1.20)

## 2012-08-23 LAB — CBC WITH DIFFERENTIAL/PLATELET
BASO%: 1.3 % (ref 0.0–2.0)
EOS%: 2.6 % (ref 0.0–7.0)
HCT: 24 % — ABNORMAL LOW (ref 34.8–46.6)
LYMPH%: 20.1 % (ref 14.0–49.7)
MCH: 29.5 pg (ref 25.1–34.0)
MCHC: 33.4 g/dL (ref 31.5–36.0)
NEUT%: 70.9 % (ref 38.4–76.8)
lymph#: 0.5 10*3/uL — ABNORMAL LOW (ref 0.9–3.3)

## 2012-08-23 MED ORDER — SODIUM CHLORIDE 0.9 % IV SOLN
Freq: Once | INTRAVENOUS | Status: AC
  Administered 2012-08-23: 12:00:00 via INTRAVENOUS

## 2012-08-23 MED ORDER — PACLITAXEL PROTEIN-BOUND CHEMO INJECTION 100 MG
100.0000 mg/m2 | Freq: Once | INTRAVENOUS | Status: AC
  Administered 2012-08-23: 225 mg via INTRAVENOUS
  Filled 2012-08-23: qty 45

## 2012-08-23 MED ORDER — OXYCODONE-ACETAMINOPHEN 5-325 MG PO TABS: 1.0000 | ORAL_TABLET | ORAL | Status: DC | PRN

## 2012-08-23 MED ORDER — HEPARIN SOD (PORK) LOCK FLUSH 100 UNIT/ML IV SOLN
500.0000 [IU] | Freq: Once | INTRAVENOUS | Status: AC | PRN
  Administered 2012-08-23: 500 [IU]
  Filled 2012-08-23: qty 5

## 2012-08-23 MED ORDER — CARVEDILOL 6.25 MG PO TABS: 6.2500 mg | ORAL_TABLET | Freq: Every morning | ORAL | Status: DC

## 2012-08-23 MED ORDER — SODIUM CHLORIDE 0.9 % IJ SOLN
10.0000 mL | INTRAMUSCULAR | Status: DC | PRN
  Administered 2012-08-23: 10 mL
  Filled 2012-08-23: qty 10

## 2012-08-23 MED ORDER — ONDANSETRON 8 MG/50ML IVPB (CHCC)
8.0000 mg | Freq: Once | INTRAVENOUS | Status: AC
  Administered 2012-08-23: 8 mg via INTRAVENOUS

## 2012-08-23 MED ORDER — DEXAMETHASONE SODIUM PHOSPHATE 10 MG/ML IJ SOLN
10.0000 mg | Freq: Once | INTRAMUSCULAR | Status: AC
  Administered 2012-08-23: 10 mg via INTRAVENOUS

## 2012-08-23 NOTE — Progress Notes (Signed)
Hematology and Oncology Follow Up Visit  Jeanette Morris [id number] November 15, 1945 66 y.o. 08/23/2012    HPI: Ms. 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 The Herceptin was held due to low ejection fraction. She underwent a right modified radical mastectomy with axillary node dissection on 12/28/2010 which revealed no evidence of residual infiltrating ductal carcinoma, but 20/21 lymph nodes were positive for metastatic disease with components of extracapsular extension. She completed radiation therapy to the right chest wall, right axilla and supraclavicular region under the care of Dr. Roselind Messier on 03/07/2011.  Herceptin on hold.  More recently she was noted to have left-sided axillary adenopathy. A biopsy was performed which didn't fracture that she had ER/PR positive disease which is HER-2 negative ( 04/17/12) . A staging PET scan showed and supraclavicular adenopathy as well as left axillary adenopathy and a small focus of the breast which could in fact also be described as a mass in the tail of the breast. This was biopsied and found to be consistent with breast cancer ER positive PR positive HER-2 negative.She was started on aromasin/affinitor at 2.5 mg/day, which was escalated to 7.5 mg/day. She was seen by cardiology 07/23/12, who noted increased SOB and a large pleural effusion was tapped . This was followed by pleurex catheter insertion  On 08/08/12. Of note effusion was positve for malignant cells. . Interim History:   Given the progression ,noted she was started on abraxane  08/16/12. She tolerated it well , no n/v or diarrhea. No numbness or tingling . I have suggested she take B complex vitamin. They continue to drain her pleurex  QOD, latest  volume was 150 ml. She is having some pain where the catheter has in serted.  Medications:   I have reviewed the patient's current medications.  Allergies: No Known Allergies  Physical Exam: Filed Vitals:   08/23/12 1014  BP: 154/81  Pulse: 95  Temp: 98.1 F (36.7 C)  Resp: 20  Weight: 243 lbs. HEENT:  Sclerae anicteric, conjunctivae pink.  Oropharynx clear.  No mucositis or candidiasis.   Nodes:  Lt sided cervical nodes~ 2-3 cm. Bulky lt axillary adenopathy. Lungs:  Clear to auscultation bilaterally.  No crackles, rhonchi, or wheezes.   Heart:  Regular rate and rhythm.   Abdomen:  Soft, nontender.  Positive bowel sounds.  No organomegaly or masses palpated.   Musculoskeletal:  No focal spinal tenderness to palpation.  Extremities:  Benign. No erythema.  Skin:  Benign.   Neuro:  Nonfocal, alert and oriented x 3.  Lab Results: Lab Results  Component Value Date   WBC 2.5* 08/23/2012   HGB 8.0* 08/23/2012   HCT 24.0* 08/23/2012   MCV 88.1 08/23/2012   PLT 208 08/23/2012   NEUTROABS 1.7 08/23/2012     Chemistry      Component Value Date/Time   NA 140 08/23/2012 0956   NA 139 08/08/2012 0658   K 4.6 08/23/2012 0956   K 3.6 08/08/2012 0658   CL 105 08/23/2012 0956   CL 103 08/08/2012 0658   CO2 28 08/23/2012 0956   CO2 28 08/08/2012 0658   BUN 25.0 08/23/2012 0956   BUN 15 08/08/2012 0658   CREATININE 1.8* 08/23/2012 0956   CREATININE 1.56* 08/08/2012 0658      Component Value  Date/Time   CALCIUM 9.2 08/23/2012 0956   CALCIUM 9.7 08/08/2012 0658   ALKPHOS 60 08/23/2012 0956   ALKPHOS 91 08/08/2012 0658   AST 19 08/23/2012 0956   AST 86* 08/08/2012 0658   ALT 17 08/23/2012 0956   ALT 117* 08/08/2012 0658   BILITOT 0.37 08/23/2012 0956   BILITOT 0.3 08/08/2012 0658       Assessment:  11. 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  2. Contralateral axillary recurrence, HER-2 negative. 3.  Cardiac toxicity associated with Herceptin.   Plan:  She is week 2 of abraxane ; she will return in 1 week for week 3, tolerating chemo. No evidence of neuropathy  Malignant pleural effusion with pleurex in place, decreasing effusion output.  Pierce Crane, M.D. 08/23/2012

## 2012-08-23 NOTE — Patient Instructions (Addendum)
[facility] Discharge Instructions for Patients Receiving Chemotherapy  Today you received the following chemotherapy agents Abraxane.  To help prevent nausea and vomiting after your treatment, we encourage you to take your nausea medication if needed or as directed.   If you develop nausea and vomiting that is not controlled by your nausea medication, call the clinic. If it is after clinic hours your family physician or the after hours number for the clinic or go to the Emergency Department.   BELOW ARE SYMPTOMS THAT SHOULD BE REPORTED IMMEDIATELY:  *FEVER GREATER THAN 100.5 F  *CHILLS WITH OR WITHOUT FEVER  NAUSEA AND VOMITING THAT IS NOT CONTROLLED WITH YOUR NAUSEA MEDICATION  *UNUSUAL SHORTNESS OF BREATH  *UNUSUAL BRUISING OR BLEEDING  TENDERNESS IN MOUTH AND THROAT WITH OR WITHOUT PRESENCE OF ULCERS  *URINARY PROBLEMS  *BOWEL PROBLEMS  UNUSUAL RASH Items with * indicate a potential emergency and should be followed up as soon as possible.  Feel free to call the clinic you have any questions or concerns. The clinic phone number is [phone].

## 2012-08-24 ENCOUNTER — Ambulatory Visit: Payer: Medicare Other

## 2012-08-27 ENCOUNTER — Telehealth: Payer: Self-pay | Admitting: Oncology

## 2012-08-27 ENCOUNTER — Other Ambulatory Visit: Payer: Self-pay | Admitting: *Deleted

## 2012-08-27 DIAGNOSIS — C50919 Malignant neoplasm of unspecified site of unspecified female breast: Secondary | ICD-10-CM

## 2012-08-27 NOTE — Telephone Encounter (Signed)
Pt aware of appt on Friday 1/3 with Dr. Darnelle Catalan.

## 2012-08-28 ENCOUNTER — Ambulatory Visit: Payer: PRIVATE HEALTH INSURANCE | Admitting: Surgery

## 2012-08-31 ENCOUNTER — Telehealth: Payer: Self-pay | Admitting: Oncology

## 2012-08-31 ENCOUNTER — Other Ambulatory Visit (HOSPITAL_BASED_OUTPATIENT_CLINIC_OR_DEPARTMENT_OTHER): Payer: Medicare Other | Admitting: Lab

## 2012-08-31 ENCOUNTER — Ambulatory Visit: Payer: PRIVATE HEALTH INSURANCE | Admitting: Oncology

## 2012-08-31 ENCOUNTER — Ambulatory Visit: Payer: Medicare Other

## 2012-08-31 ENCOUNTER — Other Ambulatory Visit: Payer: PRIVATE HEALTH INSURANCE | Admitting: Lab

## 2012-08-31 ENCOUNTER — Ambulatory Visit (HOSPITAL_BASED_OUTPATIENT_CLINIC_OR_DEPARTMENT_OTHER): Payer: Medicaid Other | Admitting: Oncology

## 2012-08-31 VITALS — BP 138/75 | HR 80 | Temp 98.3°F | Resp 20 | Ht 65.0 in | Wt 230.7 lb

## 2012-08-31 DIAGNOSIS — C50919 Malignant neoplasm of unspecified site of unspecified female breast: Secondary | ICD-10-CM

## 2012-08-31 DIAGNOSIS — J910 Malignant pleural effusion: Secondary | ICD-10-CM | POA: Diagnosis not present

## 2012-08-31 DIAGNOSIS — C50619 Malignant neoplasm of axillary tail of unspecified female breast: Secondary | ICD-10-CM

## 2012-08-31 DIAGNOSIS — D649 Anemia, unspecified: Secondary | ICD-10-CM

## 2012-08-31 DIAGNOSIS — Z170 Estrogen receptor positive status [ER+]: Secondary | ICD-10-CM | POA: Diagnosis not present

## 2012-08-31 LAB — COMPREHENSIVE METABOLIC PANEL (CC13)
AST: 14 U/L (ref 5–34)
BUN: 19 mg/dL (ref 7.0–26.0)
CO2: 28 mEq/L (ref 22–29)
Calcium: 9.4 mg/dL (ref 8.4–10.4)
Chloride: 108 mEq/L — ABNORMAL HIGH (ref 98–107)
Creatinine: 1.6 mg/dL — ABNORMAL HIGH (ref 0.6–1.1)

## 2012-08-31 LAB — CBC WITH DIFFERENTIAL/PLATELET
Basophils Absolute: 0 10*3/uL (ref 0.0–0.1)
EOS%: 1.6 % (ref 0.0–7.0)
HCT: 22.5 % — ABNORMAL LOW (ref 34.8–46.6)
HGB: 7.5 g/dL — ABNORMAL LOW (ref 11.6–15.9)
MCH: 29.3 pg (ref 25.1–34.0)
NEUT%: 62.1 % (ref 38.4–76.8)
lymph#: 0.5 10*3/uL — ABNORMAL LOW (ref 0.9–3.3)

## 2012-08-31 NOTE — Telephone Encounter (Signed)
appts made and printed for pt, pt aware that her tx will follow her md visit and all other tx will be added email to [facility] and pt is aware      anne

## 2012-09-01 ENCOUNTER — Other Ambulatory Visit: Payer: Self-pay | Admitting: Oncology

## 2012-09-01 ENCOUNTER — Encounter: Payer: Self-pay | Admitting: Oncology

## 2012-09-01 NOTE — Progress Notes (Signed)
ID: Jeanette Morris   DOB: 06/19/1946  MR#: [id number]  CSN#:[id number]  PCP: Egbert Garibaldi, NP GYN:  SUFelicity Pellegrini OTHER MD: Arvilla Meres, Antony Blackbird, Rexanne Mano   HISTORY OF PRESENT ILLNESS: The patient undergoes annual screening mammography.  Her mammograms previously had fullness.  In August 2011 she presented with some firmness of her right breast. She also noted some skin thickening around her nipple and some tenderness.  She was subsequently seen for this on 05/13/2010 and was referred for bilateral mammogram and right breast ultrasound.  There was concern that this may be mastitis.  There were diffuse skin thickening, tissue edema, enlarged lymph node in right axilla, some subareolar distortion secondary to the previous surgery.  She was given a course of doxycycline for 10 days and asked to return. A follow-up ultrasound of the breast showed persistent abnormalities.  She was referred for biopsy.  She underwent biopsy of the right axilla on 05/27/2010 and it showed metastatic carcinoma consistent with breast primary.  This was ER and PR positive, HER-2 was positive with a ratio of 3.09.  The ER ratio is 34%, PR was 64%, proliferative index 60%.  She subsequently had a MRI scan of both breasts on 06/03/2010 and this showed a area of minimal enhancement in the right breast measuring 10.0 x 6.0 x 3.0 cm.  Enlarged lymph nodes were seen in the right axilla.  Largest node measuring 3.0 x 1.0 x 7.0 cm.  Intramammary lymph nodes were also seen in the outer left breast, the largest measuring 2.8 x 2.5 cm.  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 The patient's subsequent history is as detailed below.  INTERVAL HISTORY: Arista returns today with her husband [name] for followup of her breast cancer. She is transitioning to my service today. This is day 15 cycle 1 of her weekly Abraxane treatments.  REVIEW OF SYSTEMS: She is  tolerating the Abraxane well, with no nausea or vomiting, good energy, no diarrhea or constipation, and no rash or immunologic reaction. She has a right Pleuryx in place, and she tells me the drainage now has dropped to less than 25 cc every other day. During the fluid is painful. Otherwise she takes oxycodone/Tylenol perhaps twice a week. A detailed review of systems today was otherwise stable.  PAST MEDICAL HISTORY: Past Medical History  Diagnosis Date  . Hypertension   . Hearing loss     right ear  . Hyperlipidemia   . Arthritis   . 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 Breast cancer     (Rt) breast ca dx 9/11  . Dysrhythmia     A fib with IV chemo treatments  . Shortness of breath     PAST SURGICAL HISTORY: Past Surgical History  Procedure Date  . Tubal ligation   . Portacath placement   . Abdominal hysterectomy   . Lung biopsy   . Lesion excision     L anterior thigh  . Breast surgery 12/01/10    ER+,PR+,Ki 67 64%,Her2 -; right breast lumpectomy  . Chest tube insertion 08/08/2012    Procedure: INSERTION PLEURAL DRAINAGE CATHETER;  Surgeon: Alleen Borne, MD;  Location: [facility] OR;  Service: Thoracic;  Laterality: Right;    FAMILY HISTORY No family history on file. The patient's father died in an automobile accident at age 29. The patient's mother died at age 66 with  Alzheimer's disease. The patient had 8 brothers and 4 sisters. There is no history of breast or ovarian cancer in the family.  GYNECOLOGIC HISTORY: Menarche age 13, first live birth age 58. She is GX P7. She underwent menopause "more than 20 years ago". She never took hormone replacement.  SOCIAL HISTORY: She has been mostly a housewife. Her husband [name] used to "at this could feel. The patient's children are from an earlier marriage, and include Kerri Perches, [place], [address], [address], Danielle [place], [place], and [employer]. The patient has 15 grandchildren and 1  great-grandchild. She at tends [place]. [employer]  ADVANCED DIRECTIVES: Not in place  HEALTH MAINTENANCE: History  Substance Use Topics  . Smoking status: Former Smoker    Quit date: 06/30/1973  . Smokeless tobacco: Never Used  . Alcohol Use: No     Colonoscopy:  PAP:  Bone density:  Lipid panel:  No Known Allergies  Current Outpatient Prescriptions  Medication Sig Dispense Refill  . aspirin 81 MG tablet Take 81 mg by mouth daily.      . Calcium Carbonate-Vitamin D 600-400 MG-UNIT per tablet Take 1 tablet by mouth every morning.       . carvedilol (COREG) 12.5 MG tablet Take 12.5 mg by mouth every morning. Take With 6.25 mg tablet to make a total strength of 18.75mg       . carvedilol (COREG) 6.25 MG tablet Take 1 tablet (6.25 mg total) by mouth every morning. Take With 12.5 mg tablet to make a total strength of 18.75mg   60 tablet  3  . ferrous sulfate 325 (65 FE) MG tablet Take 1 tablet (325 mg total) by mouth daily with breakfast.  30 tablet  1  . gabapentin (NEURONTIN) 300 MG capsule Take 300 mg by mouth at bedtime.       . lidocaine-prilocaine (EMLA) cream Apply topically as needed.  30 g  1  . oxyCODONE-acetaminophen (PERCOCET/ROXICET) 5-325 MG per tablet Take 1 tablet by mouth every 4 (four) hours as needed for pain.  30 tablet  0  . prochlorperazine (COMPAZINE) 10 MG tablet Take 1 tablet (10 mg total) by mouth every 6 (six) hours as needed (Nausea or vomiting).  30 tablet  1  . prochlorperazine (COMPAZINE) 25 MG suppository Place 1 suppository (25 mg total) rectally every 12 (twelve) hours as needed for nausea.  12 suppository  3  . ondansetron (ZOFRAN) 8 MG tablet Take 1 tablet (8 mg total) by mouth 2 (two) times daily. Take two times a day starting the day after chemo for 2 days. Then take two times a day as needed for nausea or vomiting.  30 tablet  1   No current facility-administered medications for this visit.   Facility-Administered Medications Ordered in Other  Visits  Medication Dose Route Frequency Provider Last Rate Last Dose  . lidocaine-prilocaine (EMLA) cream   Topical Once Pierce Crane, MD        OBJECTIVE: Elderly African American woman who appears comfortable Filed Vitals:   08/31/12 1119  BP: 138/75  Pulse: 80  Temp: 98.3 F (36.8 C)  Resp: 20     Body mass index is 38.39 kg/(m^2).    ECOG FS: 2  Sclerae unicteric Oropharynx clear No cervical or supraclavicular adenopathy palpated on either side Lungs no rales or rhonchi; right-sided Pleurx in place Heart regular rate and rhythm Abd obese, benign MSK no focal spinal tenderness Neuro: nonfocal, oriented x3, positive affect Breasts: The right breast  is status post mastectomy. There is no evidence of local recurrence. The right axilla is clear. The left breast shows no discrete mass, but there is skin thickening and darkening in the upper outer quadrant. There is no nipple retraction. I do not palpate well-defined adenopathy in the left axilla.  LAB RESULTS: Lab Results  Component Value Date   WBC 1.7* 08/31/2012   NEUTROABS 1.0* 08/31/2012   HGB 7.5* 08/31/2012   HCT 22.5* 08/31/2012   MCV 87.8 08/31/2012   PLT 228 08/31/2012      Chemistry      Component Value Date/Time   NA 142 08/31/2012 1103   NA 139 08/08/2012 0658   K 4.3 08/31/2012 1103   K 3.6 08/08/2012 0658   CL 108* 08/31/2012 1103   CL 103 08/08/2012 0658   CO2 28 08/31/2012 1103   CO2 28 08/08/2012 0658   BUN 19.0 08/31/2012 1103   BUN 15 08/08/2012 0658   CREATININE 1.6* 08/31/2012 1103   CREATININE 1.56* 08/08/2012 0658      Component Value Date/Time   CALCIUM 9.4 08/31/2012 1103   CALCIUM 9.7 08/08/2012 0658   ALKPHOS 58 08/31/2012 1103   ALKPHOS 91 08/08/2012 0658   AST 14 08/31/2012 1103   AST 86* 08/08/2012 0658   ALT 10 08/31/2012 1103   ALT 117* 08/08/2012 0658   BILITOT 0.28 08/31/2012 1103   BILITOT 0.3 08/08/2012 0658       Lab Results  Component Value Date   LABCA2 95* 08/14/2012    No components found  with this basename: [id number]    No results found for this basename: INR:1;PROTIME:1 in the last 168 hours  Urinalysis    Component Value Date/Time   COLORURINE YELLOW 08/03/2012 1041   APPEARANCEUR CLEAR 08/03/2012 1041   LABSPEC 1.016 08/03/2012 1041   PHURINE 6.0 08/03/2012 1041   GLUCOSEU NEGATIVE 08/03/2012 1041   HGBUR NEGATIVE 08/03/2012 1041   BILIRUBINUR NEGATIVE 08/03/2012 1041   KETONESUR NEGATIVE 08/03/2012 1041   PROTEINUR NEGATIVE 08/03/2012 1041   UROBILINOGEN 0.2 08/03/2012 1041   NITRITE NEGATIVE 08/03/2012 1041   LEUKOCYTESUR NEGATIVE 08/03/2012 1041    STUDIES: Dg Chest 2 View  08/07/2012  *RADIOLOGY REPORT*  Clinical Data: Pleural effusion, history of breast cancer, short of breath  CHEST - 2 VIEW  Comparison: Prior chest x-ray 08/01/2012; most recent prior chest CT 07/30/2012  Findings: Unchanged position of left subclavian approach single lumen portacatheter.  The catheter tip terminates in the upper superior vena cava at its confluence with the left brachiocephalic vein.  Notably, the catheters oriented perpendicular to the superior vena cava with the tip directed toward the lateral caval wall.  Slight interval improvement in the appearance of the loculated right pleural effusion and associated right basilar opacity. Inspiratory volumes remain low.  Trace left effusion persists and may be incrementally increased.  Negative for pulmonary edema or new airspace consolidation.  Right to left displacement of the tracheal air column consistent with known enlarged left thyroid gland.  IMPRESSION:  1.  Slightly improved loculated right pleural effusion and associated right basilar opacity likely reflecting a combination of pleural fluid with atelectasis and / or consolidation.  2.  Small left pleural effusion is stable to slightly increased in size.  3.  Unchanged position of left subclavian approach portacatheter. The catheter tip is perpendicular to the SVC and likely abuts the lateral  caval wall.   Original Report Authenticated By: Malachy Moan, M.D.    Dg Chest [facility]  1 View  08/08/2012  *RADIOLOGY REPORT*  Clinical Data: Right-sided Pleurx drainage catheter insertion.  PORTABLE CHEST - 1 VIEW 08/08/2012 1007 hours:  Comparison: Two-view chest x-ray yesterday [facility] and portable chest x-ray 08/01/2012 [facility].  Findings: Pleurx drainage catheter overlying the right lower chest with marked reduction in the right pleural effusion since yesterday.  A small effusion persists.  Improved aeration at the right lung base with mild atelectasis persisting.  Stable small left pleural effusion and associated passive atelectasis at the left lung base.  No new pulmonary parenchymal abnormalities.  Left subclavian Port-A-Cath tip projects against the lateral margin of the upper IVC, unchanged.  Surgical clips in the right axilla.  IMPRESSION:  1.  Right sided Pleurex drainage catheter with marked reduction in the right pleural effusion since yesterday.  A very small effusion persists. 2.  Improved aeration at the right lung base, with mild atelectasis persisting. 3.  Stable small left pleural effusion and associated mild passive atelectasis in the left lower lobe. 4.  Left subclavian Port-A-Cath tip again projects against the lateral margin of the upper IVC.   Original Report Authenticated By: Hulan Saas, M.D.     ASSESSMENT: 67 y.o. [place] woman with stage 4 breast cancer  (1) status post RIGHT axillary lymph node biopsy 05/27/2010 for a clinical T4 N1, stage IIIB (inflammatory) nvasive ductal carcinoma, grade not stated, estrogen receptor 94% positive, progesterone receptor 64% positive, with an MIB-1 of 60%, and HER-2 amplification by FISH with a ratio of 7.5.  (2) neoadjuvant chemotherapy consisted of 6 cycles of standard carboplatin, docetaxel, trastuzumab, with the trastuzumab held cycles 5 and 6 due to a drop in her borderline ejection fraction (from 45-50%  at baseline to 40-45% January 2012)-- total trastuzumab treatment = 2 months  (3) status post right mastectomy and axillary lymph node dissection 12/01/2010, showing no residual invasive carcinoma in the breast, but residual tumor in 20 of 21 axillary lymph nodes. (Only one of the 20 positive lymph nodes showed a significant residual tumor, measuring 5 mm; the remaining lymph nodes, including the evidence of extracapsular extension, showed single and clusters a few neoplastic cells with extensive therapy related changes).  (4) postmastectomy radiation to the right chest wall, axilla, and supraclavicular region completed 03/07/2011  (5) additional 2 months of trastuzumab given February through April 2013, again discontinued because of a drop in the ejection fraction and lateral S'  (6) on letrozole July 2012 to September 2013  (7) LEFT axillary adenopathy noted by Dr. Carolynne Edouard on exam July 2013, with biopsy of a left axillary node 04/17/2012 showing an invasive ductal carcinoma, estrogen receptor 91% positive, progesterone receptor 9% positive, with an MIB-1 of 45%, and no HER-2 amplification. PET scan shows multiple enlarged left axillary  and small left supraclavicular lymph nodes that are moderately hypermetabolic, with some hypermetabolic activity within the left axillary tail. No other hypermetabolic lymph nodes identified. There was no involvement of the right axilla, mediastinum or right chest wall and no extrathoracic metastases were demonstrated. It is not clear at this point if this represents a second primary or metastatic spread from her earlier tumor (pathology review requested Jan 2014)  (8) on exemestane and everolimus September through December 2013  (9) s/p RIGHT thoracentesis 08/01/2012, cytologically positive  (10) Abraxane started 08/16/2012  PLAN: Ahsley is tolerating the Abraxane well, and it appears to be a ready showing a response in her left breast. Her {effusion appears to not  be reaccumulating and perhaps  her Pleurx can be removed after she sees Dr. Laneta Simmers again next week.  Her counts today are borderline, and we discussed proceeding with a third dose of Abraxane then adding Neulasta, or simply giving her the Abraxane on day 1 and 8 of each cycle, and omitting the day 15. Because she had significant problems with Neulasta in the past, we decided to hold treatment today, and resume a week from today. Accordingly she will see me again January 9, and we will start cycle 2 at that time. her She may also need epo temporarily if her anemia worsens.   Lowella Dell    09/01/2012  [id number] [phone]

## 2012-09-03 ENCOUNTER — Other Ambulatory Visit: Payer: Self-pay | Admitting: *Deleted

## 2012-09-03 ENCOUNTER — Telehealth: Payer: Self-pay | Admitting: *Deleted

## 2012-09-03 ENCOUNTER — Telehealth: Payer: Self-pay | Admitting: Oncology

## 2012-09-03 DIAGNOSIS — J90 Pleural effusion, not elsewhere classified: Secondary | ICD-10-CM

## 2012-09-03 NOTE — Telephone Encounter (Signed)
S/W THE PT'S HUSBAND AND HE IS AWARE OF THE APPTS ON 09/06/2012

## 2012-09-03 NOTE — Telephone Encounter (Signed)
Per staff message and POF I have scheduled appts.  JMW  

## 2012-09-04 ENCOUNTER — Ambulatory Visit: Payer: PRIVATE HEALTH INSURANCE | Admitting: Surgery

## 2012-09-04 ENCOUNTER — Other Ambulatory Visit: Payer: Self-pay | Admitting: Oncology

## 2012-09-05 ENCOUNTER — Ambulatory Visit: Payer: Medicare Other | Admitting: Surgery

## 2012-09-06 ENCOUNTER — Other Ambulatory Visit (HOSPITAL_BASED_OUTPATIENT_CLINIC_OR_DEPARTMENT_OTHER): Payer: Medicare Other | Admitting: Lab

## 2012-09-06 ENCOUNTER — Telehealth: Payer: Self-pay | Admitting: Oncology

## 2012-09-06 ENCOUNTER — Ambulatory Visit (HOSPITAL_BASED_OUTPATIENT_CLINIC_OR_DEPARTMENT_OTHER): Payer: PRIVATE HEALTH INSURANCE

## 2012-09-06 ENCOUNTER — Ambulatory Visit (HOSPITAL_BASED_OUTPATIENT_CLINIC_OR_DEPARTMENT_OTHER): Payer: Medicare Other | Admitting: Oncology

## 2012-09-06 VITALS — BP 181/89 | HR 73 | Temp 98.1°F | Resp 20 | Ht 65.0 in | Wt 234.8 lb

## 2012-09-06 DIAGNOSIS — C50619 Malignant neoplasm of axillary tail of unspecified female breast: Secondary | ICD-10-CM

## 2012-09-06 DIAGNOSIS — Z170 Estrogen receptor positive status [ER+]: Secondary | ICD-10-CM

## 2012-09-06 DIAGNOSIS — C50919 Malignant neoplasm of unspecified site of unspecified female breast: Secondary | ICD-10-CM

## 2012-09-06 DIAGNOSIS — D649 Anemia, unspecified: Secondary | ICD-10-CM

## 2012-09-06 DIAGNOSIS — Z5111 Encounter for antineoplastic chemotherapy: Secondary | ICD-10-CM

## 2012-09-06 DIAGNOSIS — C50912 Malignant neoplasm of unspecified site of left female breast: Secondary | ICD-10-CM

## 2012-09-06 DIAGNOSIS — J910 Malignant pleural effusion: Secondary | ICD-10-CM

## 2012-09-06 LAB — CBC WITH DIFFERENTIAL/PLATELET
BASO%: 0.3 % (ref 0.0–2.0)
Basophils Absolute: 0 10*3/uL (ref 0.0–0.1)
Eosinophils Absolute: 0 10*3/uL (ref 0.0–0.5)
HCT: 27 % — ABNORMAL LOW (ref 34.8–46.6)
HGB: 8.5 g/dL — ABNORMAL LOW (ref 11.6–15.9)
MONO#: 0.5 10*3/uL (ref 0.1–0.9)
NEUT#: 2.3 10*3/uL (ref 1.5–6.5)
NEUT%: 65.7 % (ref 38.4–76.8)
Platelets: 242 10*3/uL (ref 145–400)
WBC: 3.6 10*3/uL — ABNORMAL LOW (ref 3.9–10.3)
lymph#: 0.7 10*3/uL — ABNORMAL LOW (ref 0.9–3.3)

## 2012-09-06 MED ORDER — DEXAMETHASONE SODIUM PHOSPHATE 10 MG/ML IJ SOLN
4.0000 mg | Freq: Once | INTRAMUSCULAR | Status: AC
  Administered 2012-09-06: 4 mg via INTRAVENOUS

## 2012-09-06 MED ORDER — PACLITAXEL PROTEIN-BOUND CHEMO INJECTION 100 MG
100.0000 mg/m2 | Freq: Once | INTRAVENOUS | Status: AC
  Administered 2012-09-06: 225 mg via INTRAVENOUS
  Filled 2012-09-06: qty 45

## 2012-09-06 MED ORDER — ONDANSETRON 8 MG/50ML IVPB (CHCC)
8.0000 mg | Freq: Once | INTRAVENOUS | Status: AC
  Administered 2012-09-06: 8 mg via INTRAVENOUS

## 2012-09-06 NOTE — Telephone Encounter (Signed)
appts made and printed for pt anne °

## 2012-09-06 NOTE — Patient Instructions (Signed)
[facility] Discharge Instructions for Patients Receiving Chemotherapy  Today you received the following chemotherapy agents: Abraxane.  To help prevent nausea and vomiting after your treatment, we encourage you to take your nausea medication.    If you develop nausea and vomiting that is not controlled by your nausea medication, call the clinic. If it is after clinic hours your family physician or the after hours number for the clinic or go to the Emergency Department.   BELOW ARE SYMPTOMS THAT SHOULD BE REPORTED IMMEDIATELY:  *FEVER GREATER THAN 100.5 F  *CHILLS WITH OR WITHOUT FEVER  NAUSEA AND VOMITING THAT IS NOT CONTROLLED WITH YOUR NAUSEA MEDICATION  *UNUSUAL SHORTNESS OF BREATH  *UNUSUAL BRUISING OR BLEEDING  TENDERNESS IN MOUTH AND THROAT WITH OR WITHOUT PRESENCE OF ULCERS  *URINARY PROBLEMS  *BOWEL PROBLEMS  UNUSUAL RASH Items with * indicate a potential emergency and should be followed up as soon as possible.   Please let the nurse know about any problems that you may have experienced. Feel free to call the clinic you have any questions or concerns. The clinic phone number is [phone].

## 2012-09-06 NOTE — Progress Notes (Signed)
ID: [name]   DOB: [birth year]  MR#: [id number]  CSN#:[id number]  PCP: Egbert Garibaldi, NP GYN:  SUFelicity Pellegrini OTHER MD: Arvilla Meres, Antony Blackbird, Rexanne Mano   HISTORY OF PRESENT ILLNESS: The patient undergoes annual screening mammography.  Her mammograms previously had fullness.  In August 2011 she presented with some firmness of her right breast. She also noted some skin thickening around her nipple and some tenderness.  She was subsequently seen for this on 05/13/2010 and was referred for bilateral mammogram and right breast ultrasound.  There was concern that this may be mastitis.  There were diffuse skin thickening, tissue edema, enlarged lymph node in right axilla, some subareolar distortion secondary to the previous surgery.  She was given a course of doxycycline for 10 days and asked to return. A follow-up ultrasound of the breast showed persistent abnormalities.  She was referred for biopsy.  She underwent biopsy of the right axilla on 05/27/2010 and it showed metastatic carcinoma consistent with breast primary.  This was ER and PR positive, HER-2 was positive with a ratio of 3.09.  The ER ratio is 34%, PR was 64%, proliferative index 60%.  She subsequently had a MRI scan of both breasts on 06/03/2010 and this showed a area of minimal enhancement in the right breast measuring 10.0 x 6.0 x 3.0 cm.  Enlarged lymph nodes were seen in the right axilla.  Largest node measuring 3.0 x 1.0 x 7.0 cm.  Intramammary lymph nodes were also seen in the outer left breast, the largest measuring 2.8 x 2.5 cm.  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 The patient's subsequent history is as detailed below.  INTERVAL HISTORY: Shauntay returns today with her husband [name] for followup of her breast cancer. Today is day 1 cycle 2 of 4 Abraxane treatment.  REVIEW OF SYSTEMS: She is doing remarkably well so far with the therapy, and in  particular denies any symptoms suggestive of peripheral neuropathy. She continues to have the Pleuryx accessed by home health nursing every other day, and tells me they only obtain 30-60 cc at a time. She has some nonspecific issues including mild sinus symptoms, but denies cough, phlegm production, pleurisy, or shortness of breath. She is doing all her usual activities. A detailed review of systems was otherwise stable.  PAST MEDICAL HISTORY: Past Medical History  Diagnosis Date  . Hypertension   . Hearing loss     right ear  . Hyperlipidemia   . Arthritis   . 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 Breast cancer     (Rt) breast ca dx 9/11  . Dysrhythmia     A fib with IV chemo treatments  . Shortness of breath     PAST SURGICAL HISTORY: Past Surgical History  Procedure Date  . Tubal ligation   . Portacath placement   . Abdominal hysterectomy   . Lung biopsy   . Lesion excision     L anterior thigh  . Breast surgery 12/01/10    ER+,PR+,Ki 67 64%,Her2 -; right breast lumpectomy  . Chest tube insertion 08/08/2012    Procedure: INSERTION PLEURAL DRAINAGE CATHETER;  Surgeon: Alleen Borne, MD;  Location: [facility] OR;  Service: Thoracic;  Laterality: Right;    FAMILY HISTORY No family history on file. The patient's father died in an automobile accident at age 72. The patient's mother died at age  93 with Alzheimer's disease. The patient had 8 brothers and 4 sisters. There is no history of breast or ovarian cancer in the family.  GYNECOLOGIC HISTORY: Menarche age 5, first live birth age 32. She is GX P7. She underwent menopause "more than 20 years ago". She never took hormone replacement.  SOCIAL HISTORY: She has been mostly a housewife. Her husband [name] used to be a cook at [employer]. The patient's children are from an earlier marriage, and include Kerri Perches, [place], [address], [address], Danielle [place], [place], and [employer]. The patient  has 15 grandchildren and 1 great-grandchild. She at tends [place]. [employer]  ADVANCED DIRECTIVES: Not in place  HEALTH MAINTENANCE: History  Substance Use Topics  . Smoking status: Former Smoker    Quit date: 06/30/1973  . Smokeless tobacco: Never Used  . Alcohol Use: No     Colonoscopy:  PAP:  Bone density:  Lipid panel:  No Known Allergies  Current Outpatient Prescriptions  Medication Sig Dispense Refill  . aspirin 81 MG tablet Take 81 mg by mouth daily.      . Calcium Carbonate-Vitamin D 600-400 MG-UNIT per tablet Take 1 tablet by mouth every morning.       . carvedilol (COREG) 12.5 MG tablet Take 12.5 mg by mouth every morning. Take With 6.25 mg tablet to make a total strength of 18.75mg       . carvedilol (COREG) 6.25 MG tablet Take 1 tablet (6.25 mg total) by mouth every morning. Take With 12.5 mg tablet to make a total strength of 18.75mg   60 tablet  3  . ferrous sulfate 325 (65 FE) MG tablet Take 1 tablet (325 mg total) by mouth daily with breakfast.  30 tablet  1  . gabapentin (NEURONTIN) 300 MG capsule Take 300 mg by mouth at bedtime.       . lidocaine-prilocaine (EMLA) cream Apply topically as needed.  30 g  1  . oxyCODONE-acetaminophen (PERCOCET/ROXICET) 5-325 MG per tablet Take 1 tablet by mouth every 4 (four) hours as needed for pain.  30 tablet  0   No current facility-administered medications for this visit.   Facility-Administered Medications Ordered in Other Visits  Medication Dose Route Frequency Provider Last Rate Last Dose  . lidocaine-prilocaine (EMLA) cream   Topical Once Pierce Crane, MD        OBJECTIVE: Elderly African American woman in no acute distress Filed Vitals:   09/06/12 1206  BP: 181/89  Pulse: 73  Temp: 98.1 F (36.7 C)  Resp: 20     Body mass index is 39.07 kg/(m^2).    ECOG FS: 2  Sclerae unicteric Oropharynx clear No cervical or supraclavicular adenopathy  Lungs no rales or rhonchi; right-sided Pleurx in place, intact Heart  regular rate and rhythm, no murmur appreciated Abd obese, benign MSK no focal spinal tenderness Neuro: nonfocal, oriented x3, positive affect Breasts: The right breast is status post mastectomy. There is no evidence of local recurrence. The right axilla is clear. The left breast shows no discrete mass, but there is skin thickening and darkening in the upper outer quadrant. There is no nipple retraction. I do not palpate well-defined adenopathy in the left axilla.  LAB RESULTS: Lab Results  Component Value Date   WBC 3.6* 09/06/2012   NEUTROABS 2.3 09/06/2012   HGB 8.5* 09/06/2012   HCT 27.0* 09/06/2012   MCV 87.1 09/06/2012   PLT 242 09/06/2012      Chemistry  Component Value Date/Time   NA 142 08/31/2012 1103   NA 139 08/08/2012 0658   K 4.3 08/31/2012 1103   K 3.6 08/08/2012 0658   CL 108* 08/31/2012 1103   CL 103 08/08/2012 0658   CO2 28 08/31/2012 1103   CO2 28 08/08/2012 0658   BUN 19.0 08/31/2012 1103   BUN 15 08/08/2012 0658   CREATININE 1.6* 08/31/2012 1103   CREATININE 1.56* 08/08/2012 0658      Component Value Date/Time   CALCIUM 9.4 08/31/2012 1103   CALCIUM 9.7 08/08/2012 0658   ALKPHOS 58 08/31/2012 1103   ALKPHOS 91 08/08/2012 0658   AST 14 08/31/2012 1103   AST 86* 08/08/2012 0658   ALT 10 08/31/2012 1103   ALT 117* 08/08/2012 0658   BILITOT 0.28 08/31/2012 1103   BILITOT 0.3 08/08/2012 0658       Lab Results  Component Value Date   LABCA2 95* 08/14/2012    No components found with this basename: [id number]    No results found for this basename: INR:1;PROTIME:1 in the last 168 hours  Urinalysis    Component Value Date/Time   COLORURINE YELLOW 08/03/2012 1041   APPEARANCEUR CLEAR 08/03/2012 1041   LABSPEC 1.016 08/03/2012 1041   PHURINE 6.0 08/03/2012 1041   GLUCOSEU NEGATIVE 08/03/2012 1041   HGBUR NEGATIVE 08/03/2012 1041   BILIRUBINUR NEGATIVE 08/03/2012 1041   KETONESUR NEGATIVE 08/03/2012 1041   PROTEINUR NEGATIVE 08/03/2012 1041   UROBILINOGEN 0.2 08/03/2012 1041    NITRITE NEGATIVE 08/03/2012 1041   LEUKOCYTESUR NEGATIVE 08/03/2012 1041    STUDIES: Dg Chest 2 View  08/07/2012  *RADIOLOGY REPORT*  Clinical Data: Pleural effusion, history of breast cancer, short of breath  CHEST - 2 VIEW  Comparison: Prior chest x-ray 08/01/2012; most recent prior chest CT 07/30/2012  Findings: Unchanged position of left subclavian approach single lumen portacatheter.  The catheter tip terminates in the upper superior vena cava at its confluence with the left brachiocephalic vein.  Notably, the catheters oriented perpendicular to the superior vena cava with the tip directed toward the lateral caval wall.  Slight interval improvement in the appearance of the loculated right pleural effusion and associated right basilar opacity. Inspiratory volumes remain low.  Trace left effusion persists and may be incrementally increased.  Negative for pulmonary edema or new airspace consolidation.  Right to left displacement of the tracheal air column consistent with known enlarged left thyroid gland.  IMPRESSION:  1.  Slightly improved loculated right pleural effusion and associated right basilar opacity likely reflecting a combination of pleural fluid with atelectasis and / or consolidation.  2.  Small left pleural effusion is stable to slightly increased in size.  3.  Unchanged position of left subclavian approach portacatheter. The catheter tip is perpendicular to the SVC and likely abuts the lateral caval wall.   Original Report Authenticated By: Malachy Moan, M.D.    Dg Chest Port 1 View  08/08/2012  *RADIOLOGY REPORT*  Clinical Data: Right-sided Pleurx drainage catheter insertion.  PORTABLE CHEST - 1 VIEW 08/08/2012 1007 hours:  Comparison: Two-view chest x-ray yesterday [facility] and portable chest x-ray 08/01/2012 [facility].  Findings: Pleurx drainage catheter overlying the right lower chest with marked reduction in the right pleural effusion since yesterday.  A small  effusion persists.  Improved aeration at the right lung base with mild atelectasis persisting.  Stable small left pleural effusion and associated passive atelectasis at the left lung base.  No new pulmonary parenchymal abnormalities.  Left  subclavian Port-A-Cath tip projects against the lateral margin of the upper IVC, unchanged.  Surgical clips in the right axilla.  IMPRESSION:  1.  Right sided Pleurex drainage catheter with marked reduction in the right pleural effusion since yesterday.  A very small effusion persists. 2.  Improved aeration at the right lung base, with mild atelectasis persisting. 3.  Stable small left pleural effusion and associated mild passive atelectasis in the left lower lobe. 4.  Left subclavian Port-A-Cath tip again projects against the lateral margin of the upper IVC.   Original Report Authenticated By: Hulan Saas, M.D.     ASSESSMENT: 67 y.o. [place] woman with stage 4 breast cancer  (1) status post RIGHT axillary lymph node biopsy 05/27/2010 for a clinical T4 N1, stage IIIB (inflammatory) invasive ductal carcinoma, grade not stated, estrogen receptor 94% positive, progesterone receptor 64% positive, with an MIB-1 of 60%, and HER-2 amplification by FISH with a ratio of 7.5.  (2) neoadjuvant chemotherapy consisted of 6 cycles of standard carboplatin, docetaxel, trastuzumab, with the trastuzumab held cycles 5 and 6 due to a drop in her borderline ejection fraction (from 45-50% at baseline to 40-45% January 2012)-- total trastuzumab treatment = 2 months  (3) status post right mastectomy and axillary lymph node dissection 12/01/2010, showing no residual invasive carcinoma in the breast, but residual tumor in 20 of 21 axillary lymph nodes. (Only one of the 20 positive lymph nodes showed a significant residual tumor, measuring 5 mm; the remaining lymph nodes, including the evidence of extracapsular extension, showed single and clusters a few neoplastic cells with extensive  therapy related changes).  (4) postmastectomy radiation to the right chest wall, axilla, and supraclavicular region completed 03/07/2011  (5) additional 2 months of trastuzumab given February through April 2013, again discontinued because of a drop in the ejection fraction and lateral S'; most recent echo 07/23/2012 showed an ejection fraction of 55%  (6) on letrozole July 2012 to September 2013  (7) LEFT axillary adenopathy noted by Dr. Carolynne Edouard on exam July 2013, with biopsy of a left axillary node 04/17/2012 showing an invasive ductal carcinoma, estrogen receptor 91% positive, progesterone receptor 9% positive, with an MIB-1 of 45%, and no HER-2 amplification. This tumor is morphologically identical to her prior Right breast cancer.  (8) PET scan shows multiple enlarged left axillary and small left supraclavicular lymph nodes that are moderately hypermetabolic, with some hypermetabolic activity within the left axillary tail. No other hypermetabolic lymph nodes identified. There was no involvement of the right axilla, mediastinum or right chest wall and no extrathoracic metastases were demonstrated.  (8) on exemestane and everolimus September through December 2013  (9) s/p RIGHT thoracentesis 08/01/2012, cytologically positive  (10) Abraxane started 08/16/2012, given day 1 and day 8 of each 21 day cycle  PLAN: Aliegha is tolerating the Abraxane well. Her hemoglobin is slightly better, with a week's break is given. She has no symptoms from her anemia. Even though the left-sided tumor was HER-2 negative, it is morphologically identical to the one on the right, and she has a cytologically positive right effusion. I would like to resume Herceptin if we could do so safely. Given her improved ejection fraction, this may be possible. I will ask Dr Nicholes Mango to review.  I asked a home health nurse to draw her fluid from the Pleuryx only once a week. This means by the time she sees Dr. Laneta Simmers 09/18/2012  she will not have had fluid withdrawn for about 6 days and he can  decide on the frequency of withdrawing the fluid.  At this point the overall plan is to continue the Abraxane for 3-6 months, assuming good tolerance, to maximal benefit, then switching to an antiestrogen, likely Faslodex, together with anti-HER-2 treatment. Lowella Dell    09/06/2012  [id number]

## 2012-09-13 ENCOUNTER — Ambulatory Visit (HOSPITAL_BASED_OUTPATIENT_CLINIC_OR_DEPARTMENT_OTHER): Payer: PRIVATE HEALTH INSURANCE

## 2012-09-13 ENCOUNTER — Other Ambulatory Visit: Payer: Self-pay | Admitting: *Deleted

## 2012-09-13 ENCOUNTER — Other Ambulatory Visit (HOSPITAL_BASED_OUTPATIENT_CLINIC_OR_DEPARTMENT_OTHER): Payer: Medicare Other | Admitting: Lab

## 2012-09-13 VITALS — BP 172/89 | HR 81 | Temp 98.8°F | Resp 20

## 2012-09-13 DIAGNOSIS — Z5111 Encounter for antineoplastic chemotherapy: Secondary | ICD-10-CM

## 2012-09-13 DIAGNOSIS — C50619 Malignant neoplasm of axillary tail of unspecified female breast: Secondary | ICD-10-CM

## 2012-09-13 DIAGNOSIS — C50919 Malignant neoplasm of unspecified site of unspecified female breast: Secondary | ICD-10-CM

## 2012-09-13 DIAGNOSIS — C50912 Malignant neoplasm of unspecified site of left female breast: Secondary | ICD-10-CM

## 2012-09-13 DIAGNOSIS — Z170 Estrogen receptor positive status [ER+]: Secondary | ICD-10-CM

## 2012-09-13 DIAGNOSIS — J910 Malignant pleural effusion: Secondary | ICD-10-CM

## 2012-09-13 LAB — COMPREHENSIVE METABOLIC PANEL (CC13)
Albumin: 2.8 g/dL — ABNORMAL LOW (ref 3.5–5.0)
BUN: 18 mg/dL (ref 7.0–26.0)
CO2: 28 mEq/L (ref 22–29)
Glucose: 106 mg/dl — ABNORMAL HIGH (ref 70–99)
Sodium: 140 mEq/L (ref 136–145)
Total Bilirubin: 0.28 mg/dL (ref 0.20–1.20)
Total Protein: 6.8 g/dL (ref 6.4–8.3)

## 2012-09-13 LAB — CBC WITH DIFFERENTIAL/PLATELET
Basophils Absolute: 0 10*3/uL (ref 0.0–0.1)
Eosinophils Absolute: 0 10*3/uL (ref 0.0–0.5)
HCT: 26.9 % — ABNORMAL LOW (ref 34.8–46.6)
HGB: 8.5 g/dL — ABNORMAL LOW (ref 11.6–15.9)
MCV: 87.1 fL (ref 79.5–101.0)
MONO%: 7.1 % (ref 0.0–14.0)
NEUT#: 1.5 10*3/uL (ref 1.5–6.5)
NEUT%: 62.3 % (ref 38.4–76.8)
Platelets: 205 10*3/uL (ref 145–400)
RDW: 17 % — ABNORMAL HIGH (ref 11.2–14.5)

## 2012-09-13 MED ORDER — DEXAMETHASONE SODIUM PHOSPHATE 10 MG/ML IJ SOLN
4.0000 mg | Freq: Once | INTRAMUSCULAR | Status: AC
  Administered 2012-09-13: 4 mg via INTRAVENOUS

## 2012-09-13 MED ORDER — SODIUM CHLORIDE 0.9 % IJ SOLN
10.0000 mL | INTRAMUSCULAR | Status: DC | PRN
  Administered 2012-09-13: 10 mL
  Filled 2012-09-13: qty 10

## 2012-09-13 MED ORDER — ONDANSETRON 8 MG/50ML IVPB (CHCC)
8.0000 mg | Freq: Once | INTRAVENOUS | Status: AC
  Administered 2012-09-13: 8 mg via INTRAVENOUS

## 2012-09-13 MED ORDER — SODIUM CHLORIDE 0.9 % IV SOLN
Freq: Once | INTRAVENOUS | Status: AC
  Administered 2012-09-13: 10:00:00 via INTRAVENOUS

## 2012-09-13 MED ORDER — PACLITAXEL PROTEIN-BOUND CHEMO INJECTION 100 MG
100.0000 mg/m2 | Freq: Once | INTRAVENOUS | Status: AC
  Administered 2012-09-13: 225 mg via INTRAVENOUS
  Filled 2012-09-13: qty 45

## 2012-09-13 MED ORDER — HEPARIN SOD (PORK) LOCK FLUSH 100 UNIT/ML IV SOLN
500.0000 [IU] | Freq: Once | INTRAVENOUS | Status: AC | PRN
  Administered 2012-09-13: 500 [IU]
  Filled 2012-09-13: qty 5

## 2012-09-13 NOTE — Progress Notes (Signed)
Per noted mild right arm lymphedema this RN faxed referral for lymphedema clinic eval and treat.  Pt aware of above.

## 2012-09-13 NOTE — Patient Instructions (Addendum)
[facility] Discharge Instructions for Patients Receiving Chemotherapy  Today you received the following chemotherapy agents Abraxane.  To help prevent nausea and vomiting after your treatment, we encourage you to take your nausea medication as ordered per MD.   If you develop nausea and vomiting that is not controlled by your nausea medication, call the clinic. If it is after clinic hours your family physician or the after hours number for the clinic or go to the Emergency Department.   BELOW ARE SYMPTOMS THAT SHOULD BE REPORTED IMMEDIATELY:  *FEVER GREATER THAN 100.5 F  *CHILLS WITH OR WITHOUT FEVER  NAUSEA AND VOMITING THAT IS NOT CONTROLLED WITH YOUR NAUSEA MEDICATION  *UNUSUAL SHORTNESS OF BREATH  *UNUSUAL BRUISING OR BLEEDING  TENDERNESS IN MOUTH AND THROAT WITH OR WITHOUT PRESENCE OF ULCERS  *URINARY PROBLEMS  *BOWEL PROBLEMS  UNUSUAL RASH Items with * indicate a potential emergency and should be followed up as soon as possible.   Please let the nurse know about any problems that you may have experienced. Feel free to call the clinic you have any questions or concerns. The clinic phone number is [phone].   I have been informed and understand all the instructions given to me. I know to contact the clinic, my physician, or go to the Emergency Department if any problems should occur. I do not have any questions at this time, but understand that I may call the clinic during office hours   should I have any questions or need assistance in obtaining follow up care.    __________________________________________  _____________  __________ Signature of Patient or Authorized Representative            Date                   Time    __________________________________________ Nurse's Signature

## 2012-09-17 ENCOUNTER — Ambulatory Visit
Admission: RE | Admit: 2012-09-17 | Discharge: 2012-09-17 | Disposition: A | Discharge status: Home / Self Care | Payer: PRIVATE HEALTH INSURANCE | Source: Ambulatory Visit | Attending: Surgery | Admitting: Surgery

## 2012-09-17 ENCOUNTER — Ambulatory Visit (INDEPENDENT_AMBULATORY_CARE_PROVIDER_SITE_OTHER): Payer: PRIVATE HEALTH INSURANCE | Admitting: Physician Assistant

## 2012-09-17 VITALS — BP 159/86 | HR 82 | Resp 20 | Ht 65.0 in | Wt 235.0 lb

## 2012-09-17 DIAGNOSIS — C50919 Malignant neoplasm of unspecified site of unspecified female breast: Secondary | ICD-10-CM

## 2012-09-17 DIAGNOSIS — J910 Malignant pleural effusion: Secondary | ICD-10-CM

## 2012-09-17 DIAGNOSIS — J90 Pleural effusion, not elsewhere classified: Secondary | ICD-10-CM

## 2012-09-17 DIAGNOSIS — Z09 Encounter for follow-up examination after completed treatment for conditions other than malignant neoplasm: Secondary | ICD-10-CM

## 2012-09-17 NOTE — Progress Notes (Addendum)
[address]            [place] [address]          [phone]    CARDIAC SURGERY POSTOPERATIVE VISIT  Patient Name: Jeanette Morris MRN: [id number] DOB: [birth year]  Subjective: Jeanette Morris is a 67 y.o. female here for routine post op follow up for placement of a right Pleur X catheter by Dr. Laneta Simmers on 08/08/2012. She has a history of metastatic breast cancer and malignant right pleural effusion. She denies chest pain, shortness of breath ,or orthopnea.   Past Medical History  Diagnosis Date  . Hypertension   . Hearing loss     right ear  . Hyperlipidemia   . Arthritis   . 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 Breast cancer     (Rt) breast ca dx 9/11  . Dysrhythmia     A fib with IV chemo treatments  . Shortness of breath    Prior to Admission medications   Medication Sig Start Date End Date Taking? Authorizing Provider  aspirin 81 MG tablet Take 81 mg by mouth daily.   Yes Historical Provider, MD  Calcium Carbonate-Vitamin D 600-400 MG-UNIT per tablet Take 1 tablet by mouth every morning.    Yes Historical Provider, MD  carvedilol (COREG) 12.5 MG tablet Take 12.5 mg by mouth every morning. Take With 6.25 mg tablet to make a total strength of 18.75mg  05/28/12 05/28/13 Yes Bevelyn Buckles Bensimhon, MD  carvedilol (COREG) 6.25 MG tablet Take 1 tablet (6.25 mg total) by mouth every morning. Take With 12.5 mg tablet to make a total strength of 18.75mg  08/23/12  Yes [name] D Clegg, NP  ferrous sulfate 325 (65 FE) MG tablet Take 1 tablet (325 mg total) by mouth daily with breakfast. 08/05/12  Yes Renae Fickle, MD  gabapentin (NEURONTIN) 300 MG capsule Take 300 mg by mouth at bedtime.    Yes Historical Provider, MD  lidocaine-prilocaine (EMLA) cream Apply topically as needed. 08/16/12  Yes Pierce Crane, MD  oxyCODONE-acetaminophen (PERCOCET/ROXICET) 5-325 MG per tablet Take 1 tablet by mouth every 4 (four) hours as needed for pain. 08/23/12  Yes  Pierce Crane, MD  Vital Signs: BP 159/86 HR 82 RR 20 and O2 saturation 96% on room air  Physical Exam:   GENERAL: In no acute distress CARDIOVASCULAR: Regular rate and rhythm. No murmurs/rubs/gallops.  RESPIRATORY: Respiratory effort is normal. Lungs clear to auscultation. ABDOMEN: Bowel sounds present. No masses or tenderness. WOUND: Clean and dry EXTREMITY: Right arm hand swollen. Pulses and motor sensory are intact  Imaging Studies: PA/LAT CXR: No pneumothorax in patient with right thoracostomy tube.  Porcelain gallbladder. Enlargement of cardiac silhouette with left ventricular prominence.   Impression/Plan: Nurse is drianing right Pleur X catheter once per week. Drainage has been as follows: December 160 cc or less, less than 10 1/13, 110 cc 1/15, and 60 cc 1/20. Will continue weekly drainage.  Hope to remove right Pleur X catheter soon. Will have her return in 2 weeks with a chest x ray to see Dr. Laneta Simmers. Of note, she may be developing a pericardial effusion (enlargement of cardiac silhouette seen on today's chest x ray) .Regarding right arm swelling and discomfort on posterior arm, likely attributed to lymphedema. She states she was referred to a place where they might fit her for a compression device for her right  arm. In addition, her port is on the left side and she has distal pulses. Likely not DVT.She has an appointment to see Dr. Carolynne Edouard in the am and the oncologist on January 30th. She was informed if she has increased swelling and tenderness, to notify us.   Doree Fudge, PA-C 09/17/2012 2:46 PM

## 2012-09-17 NOTE — Patient Instructions (Signed)
Nurse to continue with weekly drainage of right Pleur X catheter. Please keep record. If patient develops shortness of breath or increased drainage, she is to contact out office

## 2012-09-18 ENCOUNTER — Encounter (INDEPENDENT_AMBULATORY_CARE_PROVIDER_SITE_OTHER): Payer: Self-pay | Admitting: General Surgery

## 2012-09-18 ENCOUNTER — Ambulatory Visit: Payer: Medicare Other | Admitting: Surgery

## 2012-09-18 ENCOUNTER — Telehealth (INDEPENDENT_AMBULATORY_CARE_PROVIDER_SITE_OTHER): Payer: Self-pay | Admitting: General Surgery

## 2012-09-18 ENCOUNTER — Ambulatory Visit (INDEPENDENT_AMBULATORY_CARE_PROVIDER_SITE_OTHER): Payer: Medicare Other | Admitting: General Surgery

## 2012-09-18 VITALS — BP 120/74 | HR 84 | Temp 96.5°F | Resp 16 | Ht 65.0 in | Wt 239.4 lb

## 2012-09-18 DIAGNOSIS — C50919 Malignant neoplasm of unspecified site of unspecified female breast: Secondary | ICD-10-CM

## 2012-09-18 DIAGNOSIS — C50912 Malignant neoplasm of unspecified site of left female breast: Secondary | ICD-10-CM

## 2012-09-18 NOTE — Telephone Encounter (Signed)
Grover Canavan with [facility] called because patient is there with RX for a compression sleeve. They need to know the strength of the sleeve. If it is 20/30 they do not need a new RX if 30/40 or 40/50 they need a new RX with strength faxed to them at [phone]. They said the difference is for preventative or maintenance but she could not give suggestions on the type of sleeve to write for. Patient is waiting there. Please call. [phone].

## 2012-09-18 NOTE — Progress Notes (Signed)
Subjective:     Patient ID: Jeanette Morris, female   DOB: 10-May-1946, 67 y.o.   MRN: [id number]  HPI The patient is a 67 year old black female who is almost 2 years out from a right mastectomy for a TxN3 right breast cancer. She now has enlarged lymph nodes in the left axilla and left supraclavicular area as well as a malignant right pleural effusion. She has started a new chemotherapy and she feels as though the tumor in the left breast and axilla is getting a little bit smaller. Her main complaint today is of swelling of her right arm  Review of Systems  Constitutional: Negative.   HENT: Negative.   Eyes: Negative.   Respiratory: Negative.   Cardiovascular: Negative.   Gastrointestinal: Negative.   Genitourinary: Negative.   Musculoskeletal: Positive for arthralgias.  Skin: Negative.   Neurological: Negative.   Hematological: Positive for adenopathy.  Psychiatric/Behavioral: Negative.        Objective:   Physical Exam  Constitutional: She is oriented to person, place, and time. She appears well-developed and well-nourished.  HENT:  Head: Normocephalic and atraumatic.  Eyes: Conjunctivae normal and EOM are normal. Pupils are equal, round, and reactive to light.  Neck: Normal range of motion. Neck supple.       There is left supraclavicular lymphadenopathy  Cardiovascular: Normal rate, regular rhythm and normal heart sounds.   Pulmonary/Chest: Effort normal and breath sounds normal.       The palpable mass in the upper outer left breast and axilla seems a little bit smaller  Abdominal: Soft. Bowel sounds are normal.  Musculoskeletal: Normal range of motion.       There is swelling of the right arm  Lymphadenopathy:    She has cervical adenopathy.  Neurological: She is alert and oriented to person, place, and time.  Skin: Skin is warm and dry.  Psychiatric: She has a normal mood and affect. Her behavior is normal.       Assessment:     The patient is nearly 2 years out  from a right mastectomy for a locally advanced cancer. She now has evidence of metastatic disease.    Plan:     At this point we will get her fitted for a compression sleeve for the right arm. She will continue to get chemotherapy as per the medical oncologist. We will plan to see her back in about 3 months.

## 2012-09-18 NOTE — Patient Instructions (Signed)
Will fit for a compression sleeve right arm

## 2012-09-19 NOTE — Telephone Encounter (Signed)
I called pt back and she says she got fitted and is going to pick up sleeve next Wednesday.

## 2012-09-20 ENCOUNTER — Ambulatory Visit: Payer: Medicare Other

## 2012-09-24 NOTE — Progress Notes (Signed)
[facility] today.  Letter mailed to patient.  

## 2012-09-26 ENCOUNTER — Ambulatory Visit: Payer: PRIVATE HEALTH INSURANCE | Admitting: Physical Therapy

## 2012-09-27 ENCOUNTER — Other Ambulatory Visit (HOSPITAL_BASED_OUTPATIENT_CLINIC_OR_DEPARTMENT_OTHER): Payer: Medicare Other | Admitting: Lab

## 2012-09-27 ENCOUNTER — Encounter: Payer: Self-pay | Admitting: Physician Assistant

## 2012-09-27 ENCOUNTER — Telehealth: Payer: Self-pay | Admitting: Oncology

## 2012-09-27 ENCOUNTER — Other Ambulatory Visit: Payer: Medicare Other | Admitting: Lab

## 2012-09-27 ENCOUNTER — Ambulatory Visit (HOSPITAL_BASED_OUTPATIENT_CLINIC_OR_DEPARTMENT_OTHER): Payer: PRIVATE HEALTH INSURANCE | Admitting: Physician Assistant

## 2012-09-27 ENCOUNTER — Ambulatory Visit (HOSPITAL_BASED_OUTPATIENT_CLINIC_OR_DEPARTMENT_OTHER): Payer: PRIVATE HEALTH INSURANCE

## 2012-09-27 VITALS — BP 145/77 | HR 83 | Temp 98.3°F | Resp 20 | Ht 65.0 in | Wt 234.4 lb

## 2012-09-27 DIAGNOSIS — C50912 Malignant neoplasm of unspecified site of left female breast: Secondary | ICD-10-CM

## 2012-09-27 DIAGNOSIS — J910 Malignant pleural effusion: Secondary | ICD-10-CM

## 2012-09-27 DIAGNOSIS — C50619 Malignant neoplasm of axillary tail of unspecified female breast: Secondary | ICD-10-CM

## 2012-09-27 DIAGNOSIS — Z170 Estrogen receptor positive status [ER+]: Secondary | ICD-10-CM

## 2012-09-27 DIAGNOSIS — Z5111 Encounter for antineoplastic chemotherapy: Secondary | ICD-10-CM

## 2012-09-27 DIAGNOSIS — C50919 Malignant neoplasm of unspecified site of unspecified female breast: Secondary | ICD-10-CM

## 2012-09-27 LAB — CBC WITH DIFFERENTIAL/PLATELET
BASO%: 0.8 % (ref 0.0–2.0)
Basophils Absolute: 0 10*3/uL (ref 0.0–0.1)
EOS%: 0.8 % (ref 0.0–7.0)
HGB: 8.7 g/dL — ABNORMAL LOW (ref 11.6–15.9)
MCH: 27.9 pg (ref 25.1–34.0)
RDW: 18.4 % — ABNORMAL HIGH (ref 11.2–14.5)
WBC: 2.6 10*3/uL — ABNORMAL LOW (ref 3.9–10.3)
lymph#: 0.9 10*3/uL (ref 0.9–3.3)

## 2012-09-27 MED ORDER — SODIUM CHLORIDE 0.9 % IJ SOLN
10.0000 mL | INTRAMUSCULAR | Status: DC | PRN
  Administered 2012-09-27: 10 mL
  Filled 2012-09-27: qty 10

## 2012-09-27 MED ORDER — SODIUM CHLORIDE 0.9 % IV SOLN
Freq: Once | INTRAVENOUS | Status: AC
  Administered 2012-09-27: 10:00:00 via INTRAVENOUS

## 2012-09-27 MED ORDER — ONDANSETRON 8 MG/50ML IVPB (CHCC)
8.0000 mg | Freq: Once | INTRAVENOUS | Status: AC
  Administered 2012-09-27: 8 mg via INTRAVENOUS

## 2012-09-27 MED ORDER — DEXAMETHASONE SODIUM PHOSPHATE 10 MG/ML IJ SOLN
4.0000 mg | Freq: Once | INTRAMUSCULAR | Status: AC
  Administered 2012-09-27: 4 mg via INTRAVENOUS

## 2012-09-27 MED ORDER — PACLITAXEL PROTEIN-BOUND CHEMO INJECTION 100 MG
100.0000 mg/m2 | Freq: Once | INTRAVENOUS | Status: AC
  Administered 2012-09-27: 225 mg via INTRAVENOUS
  Filled 2012-09-27: qty 45

## 2012-09-27 MED ORDER — HEPARIN SOD (PORK) LOCK FLUSH 100 UNIT/ML IV SOLN
500.0000 [IU] | Freq: Once | INTRAVENOUS | Status: AC | PRN
  Administered 2012-09-27: 500 [IU]
  Filled 2012-09-27: qty 5

## 2012-09-27 NOTE — Progress Notes (Signed)
Hematology and Oncology Follow Up Visit  Jeanette Morris [id number] Oct 18, 1945 67 y.o. 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 The Herceptin was held due to low ejection fraction. She underwent a right modified radical mastectomy with axillary node dissection on 12/28/2010 which revealed no evidence of residual infiltrating ductal carcinoma, but 20/21 lymph nodes were positive for metastatic disease with components of extracapsular extension. She completed radiation therapy to the right chest wall, right axilla and supraclavicular region under the care of Dr. Roselind Messier on 03/07/2011. After improvement of her left ventricular ejection fraction, Herceptin has been resumed, due for next every 3 week dose today. She continues on Femara 2.5 mg by mouth daily, due for her next every 3 week dose of Herceptin.  Interim History:   [name] is seen today prior to her next every 3 week dose of Herceptin. She really voices no specific complaints, that she noticed that her right thumb is still causing issues. She has been unaware of any frank lymphedema of the right upper extremity, but it does appear that she has a little present today. She denies any fevers, chills, or night sweats. No nausea, emesis, diarrhea or constipation issues. She denies any diffuse joint sensitivity. She denies any frank bone pain though she does occasionally have pain in her surgical site for which she utilizes Percocet appropriately. Her energy level is good, and she notes she sees [name] for cardiology followup on 12/22/2011 including echocardiogram. A detailed review of systems is otherwise noncontributory as noted below.  Review of  Systems: Constitutional:  no weight loss, fever, night sweats and feels well Eyes: uses glasses ENT: no complaints Cardiovascular: no chest pain or dyspnea on exertion Respiratory: no cough, shortness of breath, or wheezing Neurological: no TIA or stroke symptoms Dermatological: negative Gastrointestinal: no abdominal pain, change in bowel habits, or black or bloody stools Genito-Urinary: no dysuria, trouble voiding, or hematuria Hematological and Lymphatic: negative Breast: negative Musculoskeletal: Pain in the right thumb along with stiffness. Remaining ROS negative.   Medications:   I have reviewed the patient's current medications.  Current Outpatient Prescriptions  Medication Sig Dispense Refill  . Calcium Carbonate-Vitamin D 600-400 MG-UNIT per tablet Take 1 tablet by mouth every morning.       . gabapentin (NEURONTIN) 300 MG capsule Take 300 mg by mouth at bedtime.       Marland Kitchen aspirin 81 MG tablet Take 81 mg by mouth daily.      . carvedilol (COREG) 12.5 MG tablet Take 12.5 mg by mouth every morning. Take With 6.25 mg tablet to make a total strength of 18.75mg       . carvedilol (COREG) 6.25 MG tablet Take 1 tablet (6.25 mg total) by mouth every morning. Take With 12.5 mg tablet to make a total strength of 18.75mg   60 tablet  3  . ferrous sulfate 325 (65 FE) MG tablet Take 1 tablet (325 mg total) by mouth daily with breakfast.  30 tablet  1  . ondansetron (ZOFRAN) 8 MG tablet Take by mouth every 8 (eight) hours as needed. After chemo tx's      . oxyCODONE-acetaminophen (PERCOCET/ROXICET) 5-325 MG per tablet Take 1 tablet by mouth every 4 (four) hours as needed for pain.  30 tablet  0  . prochlorperazine (COMPAZINE) 10 MG tablet Take 10 mg by mouth every 6 (six) hours as needed. After chemo tx's      . spironolactone (ALDACTONE) 25 MG tablet       . vitamin B-12 (CYANOCOBALAMIN) 100 MCG tablet Take 50 mcg by mouth daily.       Current Facility-Administered Medications  Medication  Dose Route Frequency Provider Last Rate Last Dose  . lidocaine-prilocaine (EMLA) cream   Topical Once Pierce Crane, MD        Allergies: No Known Allergies  Physical Exam: Filed Vitals:   09/27/11 1413  BP: 160/83  Pulse: 75  Temp: 98.5 F (36.9 C)  Weight: 244 lbs. HEENT:  Sclerae anicteric, conjunctivae pink.  Oropharynx clear.  No mucositis or candidiasis.   Nodes:  No cervical, supraclavicular, or axillary lymphadenopathy palpated.  Breast Exam: Deferred. Lungs:  Clear to auscultation bilaterally.  No crackles, rhonchi, or wheezes.   Heart:  Regular rate and rhythm.   Abdomen:  Soft, nontender.  Positive bowel sounds.  No organomegaly or masses palpated.   Musculoskeletal:  No focal spinal tenderness to palpation.  Extremities:  Benign, except there is evidence to suggest very mild right upper extremity lymphedema. No obvious deformity is noted of the right thumb though does have some decreased range of motion. No erythema.  Skin:  Benign.   Neuro:  Nonfocal, alert and oriented x 3.  Lab Results: Lab Results  Component Value Date   WBC 2.6* 09/27/2012   HGB 8.7* 09/27/2012   HCT 27.3* 09/27/2012   MCV 87.5 09/27/2012   PLT 260 09/27/2012   NEUTROABS 1.3* 09/27/2012     Chemistry      Component Value Date/Time   NA 140 09/13/2012 0807   NA 139 08/08/2012 0658   K 4.3 09/13/2012 0807   K 3.6 08/08/2012 0658   CL 106 09/13/2012 0807   CL 103 08/08/2012 0658   CO2 28 09/13/2012 0807   CO2 28 08/08/2012 0658   BUN 18.0 09/13/2012 0807   BUN 15 08/08/2012 0658   CREATININE 1.2* 09/13/2012 0807   CREATININE 1.56* 08/08/2012 0658      Component Value Date/Time   CALCIUM 9.0 09/13/2012 0807   CALCIUM 9.7 08/08/2012 0658   ALKPHOS 60 09/13/2012 0807   ALKPHOS 91 08/08/2012 0658   AST 12 09/13/2012 0807   AST 86* 08/08/2012 0658   ALT 10 09/13/2012 0807   ALT 117* 08/08/2012 0658   BILITOT 0.28 09/13/2012 0807   BILITOT 0.3 08/08/2012 0658        Radiological  Studies: 09/22/11 Study Conclusions - Left ventricle: Technically difficult study. The cavity size was normal. Wall thickness was increased in a pattern of moderate LVH. The estimated ejection fraction was 55%. Wall motion was normal; there were no regional wall motion abnormalities. - Impressions: Compared to the images of 01/2011, I do believe that overall LV function looks a little better. Impressions: - Compared to the images of 01/2011, I do believe that overall LV function looks a little better. Prepared and Electronically Authenticated by  Willa Rough, MD, [facility]   Assessment:  Ms. 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 The Herceptin was held due to low ejection fraction. She underwent a right modified radical mastectomy  with axillary node dissection on 12/28/2010 which revealed no evidence of residual infiltrating ductal carcinoma, but 20/21 lymph nodes were positive for metastatic disease with components of extracapsular extension. She completed radiation therapy to the right chest wall, right axilla and supraclavicular region under the care of Dr. Roselind Messier on 03/07/2011. After improvement of her left ventricular ejection fraction, Herceptin has been resumed, due for next every 3 week dose today. She continues on Femara 2.5 mg by mouth daily.  2. Mild right upper extremity lymphedema.  3. Pain in right thumb.      Plan:  Ms. Klute will continue on Femara 2.5 mg per day. She will receive her Herceptin today as scheduled at 6 mg per kilogram dosing. I will set her up to receive a plain film of the hand with attention to the right thumb, but she will also followup with her primary care in this regards. I have refilled her Percocet  5/325 #90 dispensed. We will refer her to the [facility] on [facility] for her right upper extremity lymphedema. She will keep her appointment as scheduled with [name] on 12/22/2011.  I will see her in 3 weeks' time prior to her next Herceptin dosing. She knows to contact us prior if the need should arise.   This plan was reviewed with the patient, who voices understanding and agreement.  She knows to call with any changes or problems.    Stafford Riviera,md

## 2012-09-27 NOTE — Progress Notes (Signed)
Ok to proceed with treatment today, despite counts WBC 2.6, ANC 1.3, verbal order received from Zollie Scale, PA and read back.

## 2012-09-27 NOTE — Patient Instructions (Addendum)
[facility] Discharge Instructions for Patients Receiving Chemotherapy  Today you received the following chemotherapy agents: Abraxane  To help prevent nausea and vomiting after your treatment, we encourage you to take your nausea medication as directed by your MD. If you develop nausea and vomiting that is not controlled by your nausea medication, call the clinic. If it is after clinic hours your family physician or the after hours number for the clinic or go to the Emergency Department.   BELOW ARE SYMPTOMS THAT SHOULD BE REPORTED IMMEDIATELY:  *FEVER GREATER THAN 100.5 F  *CHILLS WITH OR WITHOUT FEVER  NAUSEA AND VOMITING THAT IS NOT CONTROLLED WITH YOUR NAUSEA MEDICATION  *UNUSUAL SHORTNESS OF BREATH  *UNUSUAL BRUISING OR BLEEDING  TENDERNESS IN MOUTH AND THROAT WITH OR WITHOUT PRESENCE OF ULCERS  *URINARY PROBLEMS  *BOWEL PROBLEMS  UNUSUAL RASH Items with * indicate a potential emergency and should be followed up as soon as possible.   Feel free to call the clinic you have any questions or concerns. The clinic phone number is [phone].

## 2012-09-27 NOTE — Progress Notes (Signed)
ID: Jeanette Morris   DOB: 10/01/1945  MR#: [id number]  CSN#:[id number]  PCP: Jeanette Garibaldi, NP GYN:  Jeanette Morris OTHER MD: Jeanette Morris, Jeanette Morris, Jeanette Morris   HISTORY OF PRESENT ILLNESS: The patient undergoes annual screening mammography.  Her mammograms previously had fullness.  In August 2011 she presented with some firmness of her right breast. She also noted some skin thickening around her nipple and some tenderness.  She was subsequently seen for this on 05/13/2010 and was referred for bilateral mammogram and right breast ultrasound.  There was concern that this may be mastitis.  There were diffuse skin thickening, tissue edema, enlarged lymph node in right axilla, some subareolar distortion secondary to the previous surgery.  She was given a course of doxycycline for 10 days and asked to return. A follow-up ultrasound of the breast showed persistent abnormalities.  She was referred for biopsy.  She underwent biopsy of the right axilla on 05/27/2010 and it showed metastatic carcinoma consistent with breast primary.  This was ER and PR positive, HER-2 was positive with a ratio of 3.09.  The ER ratio is 34%, PR was 64%, proliferative index 60%.  She subsequently had a MRI scan of both breasts on 06/03/2010 and this showed a area of minimal enhancement in the right breast measuring 10.0 x 6.0 x 3.0 cm.  Enlarged lymph nodes were seen in the right axilla.  Largest node measuring 3.0 x 1.0 x 7.0 cm.  Intramammary lymph nodes were also seen in the outer left breast, the largest measuring 2.8 x 2.5 cm.  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 The patient's subsequent history is as detailed below.  INTERVAL HISTORY: Jeanette Morris returns today with her husband Jeanette Morris for followup of her breast cancer. Today is day 1 cycle 3 of 4 Abraxane treatments.  The Abraxane is being given on days one and 8 of each 21 day cycle.  Jeanette Morris has  been tolerating treatment well, and overall the interval history is unremarkable. She is having her Pleurx drained weekly, and tells me they are obtaining between 60 and 75 cc weekly.  REVIEW OF SYSTEMS: Jeanette Morris has had no fevers or chills. Her energy level is fair. She denies any signs of peripheral neuropathy in either the upper or lower tremor these. She is eating well denies nausea, emesis, or change in bowel habits. She's had no significant cough, no phlegm production, and no increased shortness of breath. She denies chest pain or palpitations. She's had no abnormal headaches or dizziness, and denies any unusual myalgias, arthralgias, or bony pain.  A detailed review of systems is otherwise stable and noncontributory.    PAST MEDICAL HISTORY: Past Medical History  Diagnosis Date  . Hypertension   . Hearing loss     right ear  . Hyperlipidemia   . Arthritis   . 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 Breast cancer     (Rt) breast ca dx 9/11  . Dysrhythmia     A fib with IV chemo treatments  . Shortness of breath     PAST SURGICAL HISTORY: Past Surgical History  Procedure Date  . Tubal ligation   . Portacath placement   . Abdominal hysterectomy   . Lung biopsy   . Lesion excision     L anterior thigh  . Breast surgery 12/01/10    ER+,PR+,Ki 67 64%,Her2 -; right breast  lumpectomy  . Chest tube insertion 08/08/2012    Procedure: INSERTION PLEURAL DRAINAGE CATHETER;  Surgeon: Alleen Borne, MD;  Location: [facility] OR;  Service: Thoracic;  Laterality: Right;    FAMILY HISTORY No family history on file. The patient's father died in an automobile accident at age 17. The patient's mother died at age 32 with Alzheimer's disease. The patient had 8 brothers and 4 sisters. There is no history of breast or ovarian cancer in the family.  GYNECOLOGIC HISTORY: Menarche age 3, first live birth age 12. She is GX P7. She underwent menopause "more than 20 years ago". She never took  hormone replacement.  SOCIAL HISTORY: She has been mostly a housewife. Her husband Jeanette Morris used to be a cook at [employer]. The patient's children are from an earlier marriage, and include Jeanette Morris, Jeanette Morris, [address], [address], Jeanette Morris, [place], and [employer]. The patient has 15 grandchildren and 1 great-grandchild. She at tends [place]. [employer]  ADVANCED DIRECTIVES: Not in place  HEALTH MAINTENANCE: History  Substance Use Topics  . Smoking status: Former Smoker    Quit date: 06/30/1973  . Smokeless tobacco: Never Used  . Alcohol Use: No     Colonoscopy:  PAP:  Bone density:  Lipid panel:  No Known Allergies  Current Outpatient Prescriptions  Medication Sig Dispense Refill  . aspirin 81 MG tablet Take 81 mg by mouth daily.      . Calcium Carbonate-Vitamin D 600-400 MG-UNIT per tablet Take 1 tablet by mouth every morning.       . carvedilol (COREG) 12.5 MG tablet Take 12.5 mg by mouth every morning. Take With 6.25 mg tablet to make a total strength of 18.75mg       . carvedilol (COREG) 6.25 MG tablet Take 1 tablet (6.25 mg total) by mouth every morning. Take With 12.5 mg tablet to make a total strength of 18.75mg   60 tablet  3  . ferrous sulfate 325 (65 FE) MG tablet Take 1 tablet (325 mg total) by mouth daily with breakfast.  30 tablet  1  . gabapentin (NEURONTIN) 300 MG capsule Take 300 mg by mouth at bedtime.       . ondansetron (ZOFRAN) 8 MG tablet Take by mouth every 8 (eight) hours as needed. After chemo tx's      . oxyCODONE-acetaminophen (PERCOCET/ROXICET) 5-325 MG per tablet Take 1 tablet by mouth every 4 (four) hours as needed for pain.  30 tablet  0  . prochlorperazine (COMPAZINE) 10 MG tablet Take 10 mg by mouth every 6 (six) hours as needed. After chemo tx's      . spironolactone (ALDACTONE) 25 MG tablet       . vitamin B-12 (CYANOCOBALAMIN) 100 MCG tablet Take 50 mcg by mouth daily.       No current  facility-administered medications for this visit.   Facility-Administered Medications Ordered in Other Visits  Medication Dose Route Frequency Provider Last Rate Last Dose  . lidocaine-prilocaine (EMLA) cream   Topical Once Pierce Crane, MD        OBJECTIVE: Jeanette Morris in no acute distress Filed Vitals:   09/27/12 0827  BP: 145/77  Pulse: 83  Temp: 98.3 F (36.8 C)  Resp: 20     Body mass index is 39.01 kg/(m^2).    ECOG FS: 2  Sclerae unicteric Oropharynx clear No cervical or supraclavicular adenopathy  Lungs no rales or rhonchi; right-sided Pleurx in place, intact Heart regular rate and  rhythm, no murmur appreciated Abdomen  obese, soft, nontender, positive bowel sounds MSK no focal spinal tenderness No peripheral edema Neuro: nonfocal, well oriented, positive affect Breasts: Deferred. I do not palpate well-defined adenopathy in the left axilla.  LAB RESULTS: Lab Results  Component Value Date   WBC 2.6* 09/27/2012   NEUTROABS 1.3* 09/27/2012   HGB 8.7* 09/27/2012   HCT 27.3* 09/27/2012   MCV 87.5 09/27/2012   PLT 260 09/27/2012      Chemistry      Component Value Date/Time   NA 140 09/13/2012 0807   NA 139 08/08/2012 0658   K 4.3 09/13/2012 0807   K 3.6 08/08/2012 0658   CL 106 09/13/2012 0807   CL 103 08/08/2012 0658   CO2 28 09/13/2012 0807   CO2 28 08/08/2012 0658   BUN 18.0 09/13/2012 0807   BUN 15 08/08/2012 0658   CREATININE 1.2* 09/13/2012 0807   CREATININE 1.56* 08/08/2012 0658      Component Value Date/Time   CALCIUM 9.0 09/13/2012 0807   CALCIUM 9.7 08/08/2012 0658   ALKPHOS 60 09/13/2012 0807   ALKPHOS 91 08/08/2012 0658   AST 12 09/13/2012 0807   AST 86* 08/08/2012 0658   ALT 10 09/13/2012 0807   ALT 117* 08/08/2012 0658   BILITOT 0.28 09/13/2012 0807   BILITOT 0.3 08/08/2012 0658       Lab Results  Component Value Date   LABCA2 95* 08/14/2012    STUDIES:  Dg Chest 2 View  09/17/2012  *RADIOLOGY REPORT*  Clinical Data:  Malignant right pleural effusion, follow-up, indwelling right pleural drainage catheter, swelling right arm  CHEST - 2 VIEW  Comparison: 08/08/2012  Findings: Left subclavian Port-A-Cath, tip projecting over SVC. Right basilar pleural drainage catheter. Enlargement of cardiac silhouette with left ventricular prominence. Tortuous aorta. Pulmonary vascularity normal. Lungs clear. Questionable tiny left pleural effusion. No pneumothorax. Porcelain gallbladder. Post right mastectomy and axillary node dissection.  IMPRESSION: No pneumothorax in patient with right thoracostomy tube. Porcelain gallbladder. Enlargement of cardiac silhouette with left ventricular prominence.   Original Report Authenticated By: Ulyses Southward, M.D.      ASSESSMENT: 67 y.o. Kinnelon Morris with stage 4 breast cancer  (1) status post RIGHT axillary lymph node biopsy 05/27/2010 for a clinical T4 N1, stage IIIB (inflammatory) invasive ductal carcinoma, grade not stated, estrogen receptor 94% positive, progesterone receptor 64% positive, with an MIB-1 of 60%, and HER-2 amplification by FISH with a ratio of 7.5.  (2) neoadjuvant chemotherapy consisted of 6 cycles of standard carboplatin, docetaxel, trastuzumab, with the trastuzumab held cycles 5 and 6 due to a drop in her borderline ejection fraction (from 45-50% at baseline to 40-45% January 2012)-- total trastuzumab treatment = 2 months  (3) status post right mastectomy and axillary lymph node dissection 12/01/2010, showing no residual invasive carcinoma in the breast, but residual tumor in 20 of 21 axillary lymph nodes. (Only one of the 20 positive lymph nodes showed a significant residual tumor, measuring 5 mm; the remaining lymph nodes, including the evidence of extracapsular extension, showed single and clusters a few neoplastic cells with extensive therapy related changes).  (4) postmastectomy radiation to the right chest wall, axilla, and supraclavicular region completed  03/07/2011  (5) additional 2 months of trastuzumab given February through April 2013, again discontinued because of a drop in the ejection fraction and lateral S'; most recent echo 07/23/2012 showed an ejection fraction of 55%  (6) on letrozole July 2012 to September 2013  (7) LEFT axillary  adenopathy noted by Dr. Carolynne Edouard on exam July 2013, with biopsy of a left axillary node 04/17/2012 showing an invasive ductal carcinoma, estrogen receptor 91% positive, progesterone receptor 9% positive, with an MIB-1 of 45%, and no HER-2 amplification. This tumor is morphologically identical to her prior Right breast cancer.  (8) PET scan shows multiple enlarged left axillary and small left supraclavicular lymph nodes that are moderately hypermetabolic, with some hypermetabolic activity within the left axillary tail. No other hypermetabolic lymph nodes identified. There was no involvement of the right axilla, mediastinum or right chest wall and no extrathoracic metastases were demonstrated.  (8) on exemestane and everolimus September through December 2013  (9) s/p RIGHT thoracentesis 08/01/2012, cytologically positive  (10) Abraxane started 08/16/2012, given day 1 and day 8 of each 21 day cycle  PLAN:  This case was reviewed with Dr. Darnelle Catalan. Kecia will proceed to treatment today as scheduled for day 1 cycle 3 of Abraxane. She'll receive day 8 next week on February 6 followed by a "week off". We will see her for followup again in 3 weeks from now, February 20, when she initiates day 1 cycle 4. She's Re: scheduled for repeat echocardiogram on 10/22/2012. Depending on those results, we would like to consider resuming trastuzumab.    At this point the overall plan is to continue the Abraxane for 3-6 months, assuming good tolerance, to maximal benefit, then switching to an antiestrogen, likely Faslodex, together with anti-HER-2 treatment.  Jaiah voices understanding and agreement with this plan, and will call  with any changes or problems.  Armenta Erskin    09/27/2012

## 2012-09-27 NOTE — Telephone Encounter (Signed)
gv pt appt schedule for february.  °

## 2012-10-02 ENCOUNTER — Ambulatory Visit: Payer: PRIVATE HEALTH INSURANCE | Admitting: Surgery

## 2012-10-03 ENCOUNTER — Ambulatory Visit: Payer: Medicare Other | Attending: Oncology | Admitting: Physical Therapy

## 2012-10-03 DIAGNOSIS — M25619 Stiffness of unspecified shoulder, not elsewhere classified: Secondary | ICD-10-CM | POA: Insufficient documentation

## 2012-10-03 DIAGNOSIS — I890 Lymphedema, not elsewhere classified: Secondary | ICD-10-CM | POA: Insufficient documentation

## 2012-10-03 DIAGNOSIS — M24519 Contracture, unspecified shoulder: Secondary | ICD-10-CM | POA: Insufficient documentation

## 2012-10-03 DIAGNOSIS — IMO0001 Reserved for inherently not codable concepts without codable children: Secondary | ICD-10-CM | POA: Insufficient documentation

## 2012-10-04 ENCOUNTER — Ambulatory Visit (HOSPITAL_BASED_OUTPATIENT_CLINIC_OR_DEPARTMENT_OTHER): Payer: PRIVATE HEALTH INSURANCE

## 2012-10-04 ENCOUNTER — Other Ambulatory Visit (HOSPITAL_BASED_OUTPATIENT_CLINIC_OR_DEPARTMENT_OTHER): Payer: Medicare Other | Admitting: Lab

## 2012-10-04 VITALS — BP 114/55 | HR 81 | Temp 97.6°F

## 2012-10-04 DIAGNOSIS — C50912 Malignant neoplasm of unspecified site of left female breast: Secondary | ICD-10-CM

## 2012-10-04 DIAGNOSIS — C50919 Malignant neoplasm of unspecified site of unspecified female breast: Secondary | ICD-10-CM

## 2012-10-04 DIAGNOSIS — Z170 Estrogen receptor positive status [ER+]: Secondary | ICD-10-CM

## 2012-10-04 DIAGNOSIS — C50619 Malignant neoplasm of axillary tail of unspecified female breast: Secondary | ICD-10-CM

## 2012-10-04 DIAGNOSIS — Z5111 Encounter for antineoplastic chemotherapy: Secondary | ICD-10-CM

## 2012-10-04 DIAGNOSIS — J910 Malignant pleural effusion: Secondary | ICD-10-CM

## 2012-10-04 LAB — COMPREHENSIVE METABOLIC PANEL (CC13)
ALT: 8 U/L (ref 0–55)
BUN: 14.5 mg/dL (ref 7.0–26.0)
CO2: 27 mEq/L (ref 22–29)
Calcium: 9 mg/dL (ref 8.4–10.4)
Chloride: 106 mEq/L (ref 98–107)
Creatinine: 1.1 mg/dL (ref 0.6–1.1)
Glucose: 115 mg/dl — ABNORMAL HIGH (ref 70–99)
Total Bilirubin: 0.27 mg/dL (ref 0.20–1.20)

## 2012-10-04 LAB — CBC WITH DIFFERENTIAL/PLATELET
Eosinophils Absolute: 0 10*3/uL (ref 0.0–0.5)
MONO#: 0.4 10*3/uL (ref 0.1–0.9)
NEUT#: 2.2 10*3/uL (ref 1.5–6.5)
Platelets: 244 10*3/uL (ref 145–400)
RBC: 3.13 10*6/uL — ABNORMAL LOW (ref 3.70–5.45)
RDW: 18.4 % — ABNORMAL HIGH (ref 11.2–14.5)
WBC: 3.5 10*3/uL — ABNORMAL LOW (ref 3.9–10.3)
nRBC: 1 % — ABNORMAL HIGH (ref 0–0)

## 2012-10-04 MED ORDER — SODIUM CHLORIDE 0.9 % IJ SOLN
10.0000 mL | INTRAMUSCULAR | Status: DC | PRN
  Administered 2012-10-04: 10 mL
  Filled 2012-10-04: qty 10

## 2012-10-04 MED ORDER — ONDANSETRON 8 MG/50ML IVPB (CHCC): 8.0000 mg | Freq: Once | INTRAVENOUS | Status: DC

## 2012-10-04 MED ORDER — HEPARIN SOD (PORK) LOCK FLUSH 100 UNIT/ML IV SOLN
500.0000 [IU] | Freq: Once | INTRAVENOUS | Status: AC | PRN
  Administered 2012-10-04: 500 [IU]
  Filled 2012-10-04: qty 5

## 2012-10-04 MED ORDER — DEXAMETHASONE SODIUM PHOSPHATE 10 MG/ML IJ SOLN
4.0000 mg | Freq: Once | INTRAMUSCULAR | Status: AC
  Administered 2012-10-04: 4 mg via INTRAVENOUS

## 2012-10-04 MED ORDER — SODIUM CHLORIDE 0.9 % IV SOLN
Freq: Once | INTRAVENOUS | Status: AC
  Administered 2012-10-04: 11:00:00 via INTRAVENOUS

## 2012-10-04 MED ORDER — PACLITAXEL PROTEIN-BOUND CHEMO INJECTION 100 MG
100.0000 mg/m2 | Freq: Once | INTRAVENOUS | Status: AC
  Administered 2012-10-04: 225 mg via INTRAVENOUS
  Filled 2012-10-04: qty 45

## 2012-10-04 NOTE — Patient Instructions (Signed)
[facility] Discharge Instructions for Patients Receiving Chemotherapy  Today you received the following chemotherapy agents Abraxane To help prevent nausea and vomiting after your treatment, we encourage you to take your nausea medication as prescribed. If you develop nausea and vomiting that is not controlled by your nausea medication, call the clinic. If it is after clinic hours your family physician or the after hours number for the clinic or go to the Emergency Department.   BELOW ARE SYMPTOMS THAT SHOULD BE REPORTED IMMEDIATELY:  *FEVER GREATER THAN 100.5 F  *CHILLS WITH OR WITHOUT FEVER  NAUSEA AND VOMITING THAT IS NOT CONTROLLED WITH YOUR NAUSEA MEDICATION  *UNUSUAL SHORTNESS OF BREATH  *UNUSUAL BRUISING OR BLEEDING  TENDERNESS IN MOUTH AND THROAT WITH OR WITHOUT PRESENCE OF ULCERS  *URINARY PROBLEMS  *BOWEL PROBLEMS  UNUSUAL RASH Items with * indicate a potential emergency and should be followed up as soon as possible.  One of the nurses will contact you 24 hours after your treatment. Please let the nurse know about any problems that you may have experienced. Feel free to call the clinic you have any questions or concerns. The clinic phone number is [phone].   I have been informed and understand all the instructions given to me. I know to contact the clinic, my physician, or go to the Emergency Department if any problems should occur. I do not have any questions at this time, but understand that I may call the clinic during office hours   should I have any questions or need assistance in obtaining follow up care.    __________________________________________  _____________  __________ Signature of Patient or Authorized Representative            Date                   Time    __________________________________________ Nurse's Signature

## 2012-10-05 ENCOUNTER — Ambulatory Visit (INDEPENDENT_AMBULATORY_CARE_PROVIDER_SITE_OTHER): Payer: Medicare Other | Admitting: Surgery

## 2012-10-05 ENCOUNTER — Encounter: Payer: Self-pay | Admitting: Surgery

## 2012-10-05 ENCOUNTER — Ambulatory Visit
Admission: RE | Admit: 2012-10-05 | Discharge: 2012-10-05 | Disposition: A | Discharge status: Home / Self Care | Payer: Medicare Other | Source: Ambulatory Visit | Attending: Surgery | Admitting: Surgery

## 2012-10-05 ENCOUNTER — Other Ambulatory Visit: Payer: Self-pay | Admitting: *Deleted

## 2012-10-05 ENCOUNTER — Other Ambulatory Visit: Payer: Self-pay

## 2012-10-05 VITALS — BP 159/73 | HR 83 | Resp 20 | Ht 65.0 in | Wt 234.0 lb

## 2012-10-05 DIAGNOSIS — C50919 Malignant neoplasm of unspecified site of unspecified female breast: Secondary | ICD-10-CM

## 2012-10-05 DIAGNOSIS — Z09 Encounter for follow-up examination after completed treatment for conditions other than malignant neoplasm: Secondary | ICD-10-CM

## 2012-10-05 DIAGNOSIS — J90 Pleural effusion, not elsewhere classified: Secondary | ICD-10-CM

## 2012-10-05 DIAGNOSIS — J910 Malignant pleural effusion: Secondary | ICD-10-CM

## 2012-10-05 NOTE — Progress Notes (Signed)
                   [address]            [place] [address]          [phone]      HPI:  The patient returns today for followup status post insertion of right Pleurx catheter on 08/08/2012 for malignat right pleural effusion related to metastatic breast cancer. She continues to undergo treatment for that cancer. The Pleurx catheter has been drained every Monday and is now draining about 90 cc once a week. She said that she is feeling fairly well and denies any shortness of breath.  Current Outpatient Prescriptions  Medication Sig Dispense Refill  . aspirin 81 MG tablet Take 81 mg by mouth daily.      . Calcium Carbonate-Vitamin D 600-400 MG-UNIT per tablet Take 1 tablet by mouth every morning.       . carvedilol (COREG) 12.5 MG tablet Take 12.5 mg by mouth every morning. Take With 6.25 mg tablet to make a total strength of 18.75mg       . carvedilol (COREG) 6.25 MG tablet Take 1 tablet (6.25 mg total) by mouth every morning. Take With 12.5 mg tablet to make a total strength of 18.75mg   60 tablet  3  . ferrous sulfate 325 (65 FE) MG tablet Take 1 tablet (325 mg total) by mouth daily with breakfast.  30 tablet  1  . gabapentin (NEURONTIN) 300 MG capsule Take 300 mg by mouth at bedtime.       . ondansetron (ZOFRAN) 8 MG tablet Take by mouth every 8 (eight) hours as needed. After chemo tx's      . oxyCODONE-acetaminophen (PERCOCET/ROXICET) 5-325 MG per tablet Take 1 tablet by mouth every 4 (four) hours as needed for pain.  30 tablet  0  . prochlorperazine (COMPAZINE) 10 MG tablet Take 10 mg by mouth every 6 (six) hours as needed. After chemo tx's      . vitamin B-12 (CYANOCOBALAMIN) 100 MCG tablet Take 50 mcg by mouth daily.         Physical Exam: BP 159/73  Pulse 83  Resp 20  Ht 5\' 5"  (1.651 m)  Wt 234 lb (106.142 kg)  BMI 38.94 kg/m2  SpO2 98% She looks well. Lung exam is clear. The right Pleurx catheter site looks fine.  Diagnostic Tests:  *RADIOLOGY  REPORT*   Clinical Data: Follow up pleural effusion.   CHEST - 2 VIEW   Comparison: 09/17/2012.   Findings: The cardiac silhouette, mediastinal and hilar contours are stable.  The left subclavian Port-A-Cath is unchanged.  The tip is against the lateral wall of the SVC. The thyroid goiter is again noted with displacement of the trachea to the right.  A Pleurx drainage catheter is in place on the right side.  No complicating features.  No recurrent pleural effusion.  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   IMPRESSION:   1.  No acute cardiopulmonary findings. 2.  Stable thyroid goiter. 3.  Stable left subclavian Port-A-Cath. 4.  Stable right-sided Pleurx drainage catheter without recurrent pleural effusion.     Original Report Authenticated By: Rudie Meyer, M.D.   Impression:  The recurrent right pleural effusion has resolved. There is minimal drainage from the Pleurx catheter.  Plan:  We'll schedule removal of her Pleurx catheter next Wednesday 10/10/12.

## 2012-10-08 ENCOUNTER — Encounter (HOSPITAL_COMMUNITY): Payer: Self-pay | Admitting: Pharmacy Technician

## 2012-10-10 ENCOUNTER — Encounter (HOSPITAL_COMMUNITY): Payer: Self-pay | Admitting: Surgery

## 2012-10-10 ENCOUNTER — Ambulatory Visit (HOSPITAL_COMMUNITY)
Admission: RE | Admit: 2012-10-10 | Discharge: 2012-10-10 | Disposition: A | Discharge status: Home / Self Care | Payer: Medicare Other | Source: Ambulatory Visit | Attending: Surgery | Admitting: Surgery

## 2012-10-10 ENCOUNTER — Encounter (HOSPITAL_COMMUNITY)
Admission: RE | Disposition: A | Discharge status: Home / Self Care | Payer: Self-pay | Source: Ambulatory Visit | Attending: Surgery

## 2012-10-10 DIAGNOSIS — J90 Pleural effusion, not elsewhere classified: Secondary | ICD-10-CM

## 2012-10-10 DIAGNOSIS — Z4682 Encounter for fitting and adjustment of non-vascular catheter: Secondary | ICD-10-CM | POA: Insufficient documentation

## 2012-10-10 HISTORY — PX: REMOVAL OF PLEURAL DRAINAGE CATHETER: SHX5080

## 2012-10-10 SURGERY — REMOVAL, CLOSED DRAINAGE CATHETER SYSTEM, PLEURAL
Anesthesia: LOCAL | Site: Chest | Laterality: Right | Wound class: Clean Contaminated

## 2012-10-10 MED ORDER — HYDROMORPHONE HCL PF 1 MG/ML IJ SOLN
INTRAMUSCULAR | Status: AC
  Filled 2012-10-10: qty 1

## 2012-10-10 NOTE — Progress Notes (Signed)
DISCHARGE INSTRUCTIONS GONE OVER WITH PATIENT BY WAYNE GOLD,PA.  DRESSING APPLIED TO SITE BY WAYNE, CDI.

## 2012-10-10 NOTE — CV Procedure (Addendum)
                   [address]            [place] [address]          [phone]     Brief procedure: Removal right pleurx catheter per routine technique. 10 cc 2% local lidocaine Patient tolerated well  Gershon Crane PA-C

## 2012-10-15 ENCOUNTER — Other Ambulatory Visit: Payer: Self-pay | Admitting: *Deleted

## 2012-10-15 ENCOUNTER — Encounter (HOSPITAL_COMMUNITY): Payer: Self-pay | Admitting: Surgery

## 2012-10-17 ENCOUNTER — Encounter (INDEPENDENT_AMBULATORY_CARE_PROVIDER_SITE_OTHER): Payer: Self-pay

## 2012-10-18 ENCOUNTER — Telehealth: Payer: Self-pay | Admitting: Oncology

## 2012-10-18 ENCOUNTER — Other Ambulatory Visit: Payer: Medicare Other | Admitting: Lab

## 2012-10-18 ENCOUNTER — Ambulatory Visit (HOSPITAL_BASED_OUTPATIENT_CLINIC_OR_DEPARTMENT_OTHER): Payer: PRIVATE HEALTH INSURANCE

## 2012-10-18 ENCOUNTER — Ambulatory Visit (HOSPITAL_BASED_OUTPATIENT_CLINIC_OR_DEPARTMENT_OTHER): Payer: PRIVATE HEALTH INSURANCE | Admitting: Oncology

## 2012-10-18 VITALS — BP 172/98 | HR 92 | Temp 98.5°F | Resp 20 | Ht 65.0 in | Wt 237.4 lb

## 2012-10-18 DIAGNOSIS — C50912 Malignant neoplasm of unspecified site of left female breast: Secondary | ICD-10-CM

## 2012-10-18 DIAGNOSIS — C773 Secondary and unspecified malignant neoplasm of axilla and upper limb lymph nodes: Secondary | ICD-10-CM

## 2012-10-18 DIAGNOSIS — Z170 Estrogen receptor positive status [ER+]: Secondary | ICD-10-CM

## 2012-10-18 DIAGNOSIS — Z9010 Acquired absence of unspecified breast and nipple: Secondary | ICD-10-CM

## 2012-10-18 DIAGNOSIS — C50919 Malignant neoplasm of unspecified site of unspecified female breast: Secondary | ICD-10-CM

## 2012-10-18 LAB — CBC WITH DIFFERENTIAL/PLATELET
BASO%: 0.4 % (ref 0.0–2.0)
Basophils Absolute: 0 10*3/uL (ref 0.0–0.1)
EOS%: 0.7 % (ref 0.0–7.0)
HCT: 28.3 % — ABNORMAL LOW (ref 34.8–46.6)
HGB: 9 g/dL — ABNORMAL LOW (ref 11.6–15.9)
MCH: 28.4 pg (ref 25.1–34.0)
MCHC: 31.8 g/dL (ref 31.5–36.0)
MCV: 89.3 fL (ref 79.5–101.0)
MONO%: 12.5 % (ref 0.0–14.0)
NEUT%: 56.4 % (ref 38.4–76.8)
lymph#: 0.8 10*3/uL — ABNORMAL LOW (ref 0.9–3.3)

## 2012-10-18 LAB — COMPREHENSIVE METABOLIC PANEL (CC13)
AST: 13 U/L (ref 5–34)
Alkaline Phosphatase: 66 U/L (ref 40–150)
BUN: 14.6 mg/dL (ref 7.0–26.0)
Creatinine: 1 mg/dL (ref 0.6–1.1)
Glucose: 104 mg/dl — ABNORMAL HIGH (ref 70–99)
Total Bilirubin: 0.37 mg/dL (ref 0.20–1.20)

## 2012-10-18 MED ORDER — DEXAMETHASONE SODIUM PHOSPHATE 10 MG/ML IJ SOLN
4.0000 mg | Freq: Once | INTRAMUSCULAR | Status: AC
  Administered 2012-10-18: 4 mg via INTRAVENOUS

## 2012-10-18 MED ORDER — OXYCODONE-ACETAMINOPHEN 5-325 MG PO TABS: 1.0000 | ORAL_TABLET | Freq: Three times a day (TID) | ORAL | Status: DC | PRN

## 2012-10-18 MED ORDER — SODIUM CHLORIDE 0.9 % IV SOLN
Freq: Once | INTRAVENOUS | Status: AC
  Administered 2012-10-18: 12:00:00 via INTRAVENOUS

## 2012-10-18 MED ORDER — ONDANSETRON 8 MG/50ML IVPB (CHCC)
8.0000 mg | Freq: Once | INTRAVENOUS | Status: AC
  Administered 2012-10-18: 8 mg via INTRAVENOUS

## 2012-10-18 MED ORDER — PACLITAXEL PROTEIN-BOUND CHEMO INJECTION 100 MG
100.0000 mg/m2 | Freq: Once | INTRAVENOUS | Status: AC
  Administered 2012-10-18: 225 mg via INTRAVENOUS
  Filled 2012-10-18: qty 45

## 2012-10-18 MED ORDER — HEPARIN SOD (PORK) LOCK FLUSH 100 UNIT/ML IV SOLN
500.0000 [IU] | Freq: Once | INTRAVENOUS | Status: AC | PRN
  Administered 2012-10-18: 500 [IU]
  Filled 2012-10-18: qty 5

## 2012-10-18 MED ORDER — SODIUM CHLORIDE 0.9 % IJ SOLN
10.0000 mL | INTRAMUSCULAR | Status: DC | PRN
  Administered 2012-10-18: 10 mL
  Filled 2012-10-18: qty 10

## 2012-10-18 NOTE — Progress Notes (Signed)
ID: Jeanette Morris   DOB: [birth year]  MR#: [id number]  CSN#:[id number]  PCP: Egbert Garibaldi, NP GYN:  SUFelicity Pellegrini OTHER MD: Arvilla Meres, Antony Blackbird, Rexanne Mano   HISTORY OF PRESENT ILLNESS: The patient undergoes annual screening mammography.  Her mammograms previously had fullness.  In August 2011 she presented with some firmness of her right breast. She also noted some skin thickening around her nipple and some tenderness.  She was subsequently seen for this on 05/13/2010 and was referred for bilateral mammogram and right breast ultrasound.  There was concern that this may be mastitis.  There were diffuse skin thickening, tissue edema, enlarged lymph node in right axilla, some subareolar distortion secondary to the previous surgery.  She was given a course of doxycycline for 10 days and asked to return. A follow-up ultrasound of the breast showed persistent abnormalities.  She was referred for biopsy.  She underwent biopsy of the right axilla on 05/27/2010 and it showed metastatic carcinoma consistent with breast primary.  This was ER and PR positive, HER-2 was positive with a ratio of 3.09.  The ER ratio is 34%, PR was 64%, proliferative index 60%.  She subsequently had a MRI scan of both breasts on 06/03/2010 and this showed a area of minimal enhancement in the right breast measuring 10.0 x 6.0 x 3.0 cm.  Enlarged lymph nodes were seen in the right axilla.  Largest node measuring 3.0 x 1.0 x 7.0 cm.  Intramammary lymph nodes were also seen in the outer left breast, the largest measuring 2.8 x 2.5 cm.  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 The patient's subsequent history is as detailed below.  INTERVAL HISTORY: Jeanette Morris returns today with her husband [name] for followup of her breast cancer. Today is day 1 cycle 4 Abraxane given on days one and 8 of each 21 day cycle. Since her last visit here at the right pleural  effusion essentially dried up and her Pleurx has been removed. Also she and [name] have joined the [facility] although they have not yet started going.   REVIEW OF SYSTEMS: Lylee is tolerating the chemotherapy generally well. She's had no significant problems with nausea or vomiting, no worsening fatigue, and no peripheral neuropathy involving the hands. 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 She has worsening right upper extremity lymphedema and she is going to get recapped for this and get a sleeve over the next 2 weeks. Otherwise a detailed review of systems today was stable  PAST MEDICAL HISTORY: Past Medical History  Diagnosis Date  . Hypertension   . Hearing loss     right ear  . Hyperlipidemia   . Arthritis   . 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 Breast cancer     (Rt) breast ca dx 9/11  . Dysrhythmia     A fib with IV chemo treatments  . Shortness of breath     PAST SURGICAL HISTORY: Past Surgical History  Procedure Laterality Date  . Tubal ligation    . Portacath placement    . Abdominal hysterectomy    . Lung biopsy    . Lesion excision      L anterior thigh  . Breast surgery  12/01/10    ER+,PR+,Ki 67 64%,Her2 -; right breast lumpectomy  . Chest tube insertion  08/08/2012    Procedure: INSERTION PLEURAL DRAINAGE CATHETER;  Surgeon: Alleen Borne, MD;  Location: [facility] OR;  Service: Thoracic;  Laterality: Right;  . Removal of pleural drainage catheter Right 10/10/2012    Procedure: REMOVAL OF PLEURAL DRAINAGE CATHETER;  Surgeon: Alleen Borne, MD;  Location: [facility] OR;  Service: Thoracic;  Laterality: Right;    FAMILY HISTORY No family history on file. The patient's father died in an automobile accident at age 23. The patient's mother died at age 13 with Alzheimer's disease. The patient had 8 brothers and 4 sisters. There is no history of breast or ovarian cancer in the  family.  GYNECOLOGIC HISTORY: Menarche age 68, first live birth age 29. She is GX P7. She underwent menopause "more than 20 years ago". She never took hormone replacement.  SOCIAL HISTORY: She has been mostly a housewife. Her husband [name] used to be a cook at [employer]. The patient's children are from an earlier marriage, and include Kerri Perches, [place], [address], [address], Danielle [place], [place], and [employer]. The patient has 15 grandchildren and 1 great-grandchild. She at tends [place]. [employer]  ADVANCED DIRECTIVES: Not in place  HEALTH MAINTENANCE: History  Substance Use Topics  . Smoking status: Former Smoker    Quit date: 06/30/1973  . Smokeless tobacco: Never Used  . Alcohol Use: No     Colonoscopy:  PAP:  Bone density:  Lipid panel:  No Known Allergies  Current Outpatient Prescriptions  Medication Sig Dispense Refill  . aspirin 81 MG tablet Take 81 mg by mouth daily.      . Calcium Carbonate-Vitamin D 600-400 MG-UNIT per tablet Take 1 tablet by mouth every morning.       . carvedilol (COREG) 12.5 MG tablet Take 12.5 mg by mouth every morning. Take With 6.25 mg tablet to make a total strength of 18.75mg       . carvedilol (COREG) 6.25 MG tablet Take 1 tablet (6.25 mg total) by mouth every morning. Take With 12.5 mg tablet to make a total strength of 18.75mg   60 tablet  3  . cyanocobalamin 500 MCG tablet Take 500 mcg by mouth daily.      . ferrous sulfate 325 (65 FE) MG tablet Take 1 tablet (325 mg total) by mouth daily with breakfast.  30 tablet  1  . gabapentin (NEURONTIN) 300 MG capsule Take 300 mg by mouth at bedtime.       . ondansetron (ZOFRAN) 8 MG tablet Take by mouth every 8 (eight) hours as needed. After chemo tx's      . oxyCODONE-acetaminophen (PERCOCET/ROXICET) 5-325 MG per tablet Take 1 tablet by mouth every 4 (four) hours as needed for pain.  30 tablet  0  . prochlorperazine (COMPAZINE) 10 MG tablet Take 10 mg by  mouth every 6 (six) hours as needed. After chemo tx's      . spironolactone (ALDACTONE) 25 MG tablet Take 25 mg by mouth daily.       No current facility-administered medications for this visit.    OBJECTIVE: Elderly African American woman in no acute distress Filed Vitals:   10/18/12 0958  BP: 172/98  Pulse: 92  Temp: 98.5 F (36.9 C)  Resp: 20       Body mass index is 39.51 kg/(m^2).    ECOG FS: 2  Sclerae unicteric Oropharynx clear No cervical or supraclavicular adenopathy  Lungs no rales or rhonchi; right-sided Pleurx in place, intact Heart regular rate and rhythm,  no murmur appreciated Abdomen  obese, soft, nontender, positive bowel sounds MSK no focal spinal tenderness; grade 1-2 right upper extremity lymphedema Neuro: nonfocal, well oriented, positive affect Breasts: The right breast is status post mastectomy. There is no evidence of local recurrence. The left breast is unremarkable. I do not palpate well-defined adenopathy in the left axilla.  LAB RESULTS: Lab Results  Component Value Date   WBC 2.8* 10/18/2012   NEUTROABS 1.6 10/18/2012   HGB 9.0* 10/18/2012   HCT 28.3* 10/18/2012   MCV 89.3 10/18/2012   PLT 256 10/18/2012      Chemistry      Component Value Date/Time   NA 140 10/04/2012 0958   NA 139 08/08/2012 0658   K 4.0 10/04/2012 0958   K 3.6 08/08/2012 0658   CL 106 10/04/2012 0958   CL 103 08/08/2012 0658   CO2 27 10/04/2012 0958   CO2 28 08/08/2012 0658   BUN 14.5 10/04/2012 0958   BUN 15 08/08/2012 0658   CREATININE 1.1 10/04/2012 0958   CREATININE 1.56* 08/08/2012 0658      Component Value Date/Time   CALCIUM 9.0 10/04/2012 0958   CALCIUM 9.7 08/08/2012 0658   ALKPHOS 62 10/04/2012 0958   ALKPHOS 91 08/08/2012 0658   AST 12 10/04/2012 0958   AST 86* 08/08/2012 0658   ALT 8 10/04/2012 0958   ALT 117* 08/08/2012 0658   BILITOT 0.27 10/04/2012 0958   BILITOT 0.3 08/08/2012 0658       Lab Results  Component Value Date   LABCA2 95* 08/14/2012     STUDIES: Dg Chest 2 View  10/05/2012  *RADIOLOGY REPORT*  Clinical Data: Follow up pleural effusion.  CHEST - 2 VIEW  Comparison: 09/17/2012.  Findings: The cardiac silhouette, mediastinal and hilar contours are stable.  The left subclavian Port-A-Cath is unchanged.  The tip is against the lateral wall of the SVC. The thyroid goiter is again noted with displacement of the trachea to the right.  A Pleurx drainage catheter is in place on the right side.  No complicating features.  No recurrent pleural effusion.  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  IMPRESSION:  1.  No acute cardiopulmonary findings. 2.  Stable thyroid goiter. 3.  Stable left subclavian Port-A-Cath. 4.  Stable right-sided Pleurx drainage catheter without recurrent pleural effusion.   Original Report Authenticated By: Rudie Meyer, M.D.      ASSESSMENT: 67 y.o. [place] woman with stage 4 breast cancer  (1) status post RIGHT axillary lymph node biopsy 05/27/2010 for a clinical T4 N1, stage IIIB (inflammatory) invasive ductal carcinoma, grade not stated, estrogen receptor 94% positive, progesterone receptor 64% positive, with an MIB-1 of 60%, and HER-2 amplification by FISH with a ratio of 7.5.  (2) neoadjuvant chemotherapy consisted of 6 cycles of standard carboplatin, docetaxel, trastuzumab, with the trastuzumab held cycles 5 and 6 due to a drop in her borderline ejection fraction (from 45-50% at baseline to 40-45% January 2012)-- total trastuzumab treatment = 2 months  (3) status post right mastectomy and axillary lymph node dissection 12/01/2010, showing no residual invasive carcinoma in the breast, but residual tumor in 20 of 21 axillary lymph nodes. (Only one of the 20 positive lymph nodes showed a significant residual tumor, measuring 5 mm; the remaining lymph nodes, including the evidence of extracapsular extension, showed single and clusters a few neoplastic cells with extensive therapy related  changes).  (4) postmastectomy radiation to the right chest wall, axilla, and supraclavicular region  completed 03/07/2011  (5) additional 2 months of trastuzumab given February through April 2013, again discontinued because of a drop in the ejection fraction and lateral S'; most recent echo 07/23/2012 showed an ejection fraction of 55%  (6) on letrozole July 2012 to September 2013  (7) LEFT axillary adenopathy noted by Dr. Carolynne Edouard on exam July 2013, with biopsy of a left axillary node 04/17/2012 showing an invasive ductal carcinoma, estrogen receptor 91% positive, progesterone receptor 9% positive, with an MIB-1 of 45%, and no HER-2 amplification. This tumor is morphologically identical to her prior Right breast cancer.  (8) PET scan 04/03/2012 showed multiple enlarged left axillary and small left supraclavicular lymph nodes that are moderately hypermetabolic, with some hypermetabolic activity within the left axillary tail. No other hypermetabolic lymph nodes identified. There was no involvement of the right axilla, mediastinum or right chest wall and no extrathoracic metastases were demonstrated.  (8) on exemestane and everolimus September through December 2013  (9) s/p RIGHT thoracentesis 08/01/2012, cytologically positive  (10) Abraxane started 08/16/2012, given day 1 and day 8 of each 21 day cycle  PLAN:  Daje is tolerating the Abraxane well, thankfully with no peripheral neuropathy or other major problems developing. She will have a total of 6 cycles, completed 12/06/2012, and we will restage her at that time. Most likely we will switch back to antiestrogen send, specifically fulvestrant. In the meantime she knows to call for any problems that may develop before the next visit.  MAGRINAT,GUSTAV C    10/18/2012

## 2012-10-18 NOTE — Telephone Encounter (Signed)
Pt given appt schedule for February thru April. [facility] will contact pt re ct appt.

## 2012-10-18 NOTE — Patient Instructions (Signed)
[facility] Discharge Instructions for Patients Receiving Chemotherapy  Today you received the following chemotherapy agents abraxane  To help prevent nausea and vomiting after your treatment, we encourage you to take your nausea medication as needed   If you develop nausea and vomiting that is not controlled by your nausea medication, call the clinic. If it is after clinic hours your family physician or the after hours number for the clinic or go to the Emergency Department.   BELOW ARE SYMPTOMS THAT SHOULD BE REPORTED IMMEDIATELY:  *FEVER GREATER THAN 100.5 F  *CHILLS WITH OR WITHOUT FEVER  NAUSEA AND VOMITING THAT IS NOT CONTROLLED WITH YOUR NAUSEA MEDICATION  *UNUSUAL SHORTNESS OF BREATH  *UNUSUAL BRUISING OR BLEEDING  TENDERNESS IN MOUTH AND THROAT WITH OR WITHOUT PRESENCE OF ULCERS  *URINARY PROBLEMS  *BOWEL PROBLEMS  UNUSUAL RASH Items with * indicate a potential emergency and should be followed up as soon as possible.  The clinic phone number is [phone].

## 2012-10-18 NOTE — Progress Notes (Signed)
Per progress note dated 10/18/12, okay to treat with WBC 2.8.  SLJ

## 2012-10-19 ENCOUNTER — Ambulatory Visit: Payer: Medicare Other | Admitting: Physical Therapy

## 2012-10-22 ENCOUNTER — Telehealth (HOSPITAL_COMMUNITY): Payer: Self-pay | Admitting: Cardiology

## 2012-10-22 ENCOUNTER — Ambulatory Visit (HOSPITAL_COMMUNITY)
Admission: RE | Admit: 2012-10-22 | Discharge: 2012-10-22 | Disposition: A | Discharge status: Home / Self Care | Payer: Medicare Other | Source: Ambulatory Visit | Attending: Internal Medicine | Admitting: Internal Medicine

## 2012-10-22 ENCOUNTER — Ambulatory Visit (HOSPITAL_BASED_OUTPATIENT_CLINIC_OR_DEPARTMENT_OTHER)
Admission: RE | Admit: 2012-10-22 | Discharge: 2012-10-22 | Disposition: A | Discharge status: Home / Self Care | Payer: Medicare Other | Source: Ambulatory Visit | Attending: Internal Medicine | Admitting: Internal Medicine

## 2012-10-22 VITALS — BP 136/78 | HR 80 | Wt 241.2 lb

## 2012-10-22 DIAGNOSIS — I10 Essential (primary) hypertension: Secondary | ICD-10-CM | POA: Insufficient documentation

## 2012-10-22 DIAGNOSIS — E785 Hyperlipidemia, unspecified: Secondary | ICD-10-CM | POA: Insufficient documentation

## 2012-10-22 DIAGNOSIS — R0609 Other forms of dyspnea: Secondary | ICD-10-CM | POA: Insufficient documentation

## 2012-10-22 DIAGNOSIS — C50919 Malignant neoplasm of unspecified site of unspecified female breast: Secondary | ICD-10-CM

## 2012-10-22 DIAGNOSIS — I369 Nonrheumatic tricuspid valve disorder, unspecified: Secondary | ICD-10-CM

## 2012-10-22 DIAGNOSIS — I080 Rheumatic disorders of both mitral and aortic valves: Secondary | ICD-10-CM | POA: Insufficient documentation

## 2012-10-22 DIAGNOSIS — R0989 Other specified symptoms and signs involving the circulatory and respiratory systems: Secondary | ICD-10-CM | POA: Insufficient documentation

## 2012-10-22 MED ORDER — SPIRONOLACTONE 25 MG PO TABS: 12.5000 mg | ORAL_TABLET | Freq: Every day | ORAL | Status: DC

## 2012-10-22 NOTE — Progress Notes (Signed)
  Echocardiogram 2D Echocardiogram has been performed.  Jalaiya Oyster 10/22/2012, 1:31 PM

## 2012-10-22 NOTE — Progress Notes (Signed)
HPI:  Jeanette Morris is a 67 y/o woman with h/o HTN and obesity. Denies any known h/o CAD or HF.  She was diagnosed with R Breast CA in 9/11. Underwent neoadjuvant chemo with (TCH regimen) followed by carboplatin and Taxotere x 6 cycles. She is status post rt mastectomy in April 2012 LN+. ER/PR/NER 2-neu +. Post-op had XRT. [name] would like to start her on Herceptin. However had echocardiogram on 01/06/11 which was read as EF 45-50% and so referred for cardiology clearance.  Her echos have been reviewed, dating back to 2003. EF has remained in 40-50% range without significant change. Lateral S' velocity also unchanged around 10.5 cm/sec - although some echos with poor windows.  With her EF running on the low end of the normal range for almost a decade and the importance of the Herceptin therapy, we cleared her for Herceptin therapy starting in September 2012 with concomitant therapy with ACE-I and b-blocker. However, there was concern that EF was drifting down and Herceptin was stopped in January and she has not been on since.  Echos: 05/03/11: 50-55%, lateral s' velocity around 11.5 cm/sec.   09/22/11: 50-55%, lateral s' 10.5-11 but poor window 12/21/11: 45-50% LVIDd 5.0 cm (up from 4.4) Lat s' 6.7cm/sec (poor windows) 02/02/12: 40-45%%   7.1 02/23/12 Cardiac MRI: EF 38%. No scarring 05/28/12 EF 45% lateral S' 10.1 07/23/12 EF50-55%  Lateral S' 11.2 10/22/12: EF ~50% lat s ' 10.1  She returns for follow up today with her husband.  She feel well.  Since her last visit she had a right pleural effusion essentially dried up and her Pleurx has been removed.  She has joined the [facility] has not started going.  She was also in the hospital with PNA and spiro and lisinopril stopped due to CRI.  + Lymphedema in right arm.  On abraxane for 2 consecutive weeks with 1 week off starting 07/2012 Has follow up CXR April 22nd with Dr. Darnelle Catalan.  Continues to have herceptin on hold.  She denies dyspnea, orthopnea or PND.  Compliant  with meds.  . ROS: All other systems normal except as mentioned in HPI, past medical history and problem list.    Past Medical History  Diagnosis Date  . Hypertension   . Hearing loss     right ear  . Hyperlipidemia   . Arthritis   . 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 Breast cancer     (Rt) breast ca dx 9/11  . Dysrhythmia     A fib with IV chemo treatments  . Shortness of breath     Current Outpatient Prescriptions  Medication Sig Dispense Refill  . aspirin 81 MG tablet Take 81 mg by mouth daily.      . Calcium Carbonate-Vitamin D 600-400 MG-UNIT per tablet Take 1 tablet by mouth every morning.       . carvedilol (COREG) 12.5 MG tablet Take 12.5 mg by mouth every morning. Take With 6.25 mg tablet to make a total strength of 18.75mg       . carvedilol (COREG) 6.25 MG tablet Take 1 tablet (6.25 mg total) by mouth every morning. Take With 12.5 mg tablet to make a total strength of 18.75mg   60 tablet  3  . cyanocobalamin 500 MCG tablet Take 500 mcg by mouth daily.      . ferrous sulfate 325 (65 FE) MG tablet Take 1 tablet (325 mg total) by mouth daily with breakfast.  30 tablet  1  . gabapentin (NEURONTIN) 300 MG capsule Take 300 mg by mouth at bedtime.       . ondansetron (ZOFRAN) 8 MG tablet Take by mouth every 8 (eight) hours as needed. After chemo tx's      . oxyCODONE-acetaminophen (PERCOCET/ROXICET) 5-325 MG per tablet Take 1 tablet by mouth every 8 (eight) hours as needed for pain.  90 tablet  0  . prochlorperazine (COMPAZINE) 10 MG tablet Take 10 mg by mouth every 6 (six) hours as needed. After chemo tx's       No current facility-administered medications for this encounter.     No Known Allergies   PHYSICAL EXAM: Filed Vitals:   10/22/12 1357  BP: 136/78  Pulse: 80  Weight: 241 lb 4 oz (109.43 kg)  SpO2: 99%   General:  Obese. Chronically ill appearing.  No respiratory difficulty  HEENT: normal Neck: supple. no JVD. Carotids 2+ bilat; no bruits. No  lymphadenopathy or thryomegaly appreciated. S/p R mastectomy Cor: PMI nondisplaced. Regular rate & rhythm. No rubs, gallops or murmurs. Lungs: clear bilaterally Abdomen: obese soft, nontender, nondistended. No hepatosplenomegaly. No bruits or masses. Good bowel sounds. Extremities: no cyanosis, clubbing, rash, edema.  Rt arm lymphedema Neuro: alert & oriented x 3, cranial nerves grossly intact. moves all 4 extremities w/o difficulty. Affect pleasant.    ASSESSMENT & PLAN:

## 2012-10-22 NOTE — Telephone Encounter (Signed)
Order placed for echo.

## 2012-10-22 NOTE — Patient Instructions (Addendum)
Add back spironolactone 12.5 mg daily  Follow up 4 months with echo.

## 2012-10-25 ENCOUNTER — Other Ambulatory Visit (HOSPITAL_BASED_OUTPATIENT_CLINIC_OR_DEPARTMENT_OTHER): Payer: Medicare Other | Admitting: Lab

## 2012-10-25 ENCOUNTER — Ambulatory Visit (HOSPITAL_BASED_OUTPATIENT_CLINIC_OR_DEPARTMENT_OTHER): Payer: PRIVATE HEALTH INSURANCE

## 2012-10-25 VITALS — BP 165/73 | HR 18 | Temp 98.5°F | Resp 18

## 2012-10-25 DIAGNOSIS — Z5111 Encounter for antineoplastic chemotherapy: Secondary | ICD-10-CM

## 2012-10-25 DIAGNOSIS — C773 Secondary and unspecified malignant neoplasm of axilla and upper limb lymph nodes: Secondary | ICD-10-CM

## 2012-10-25 DIAGNOSIS — C50919 Malignant neoplasm of unspecified site of unspecified female breast: Secondary | ICD-10-CM

## 2012-10-25 LAB — CBC WITH DIFFERENTIAL/PLATELET
Basophils Absolute: 0 10*3/uL (ref 0.0–0.1)
Eosinophils Absolute: 0 10*3/uL (ref 0.0–0.5)
HCT: 27.9 % — ABNORMAL LOW (ref 34.8–46.6)
HGB: 9.1 g/dL — ABNORMAL LOW (ref 11.6–15.9)
LYMPH%: 25.7 % (ref 14.0–49.7)
MCH: 28.7 pg (ref 25.1–34.0)
MCV: 88 fL (ref 79.5–101.0)
MONO%: 9.5 % (ref 0.0–14.0)
NEUT#: 1.9 10*3/uL (ref 1.5–6.5)
NEUT%: 63.1 % (ref 38.4–76.8)
Platelets: 229 10*3/uL (ref 145–400)
RDW: 18 % — ABNORMAL HIGH (ref 11.2–14.5)

## 2012-10-25 LAB — TECHNOLOGIST REVIEW

## 2012-10-25 MED ORDER — ONDANSETRON 8 MG/50ML IVPB (CHCC)
8.0000 mg | Freq: Once | INTRAVENOUS | Status: AC
  Administered 2012-10-25: 8 mg via INTRAVENOUS

## 2012-10-25 MED ORDER — DEXAMETHASONE SODIUM PHOSPHATE 10 MG/ML IJ SOLN
4.0000 mg | Freq: Once | INTRAMUSCULAR | Status: AC
  Administered 2012-10-25: 4 mg via INTRAVENOUS

## 2012-10-25 MED ORDER — PACLITAXEL PROTEIN-BOUND CHEMO INJECTION 100 MG
100.0000 mg/m2 | Freq: Once | INTRAVENOUS | Status: AC
  Administered 2012-10-25: 225 mg via INTRAVENOUS
  Filled 2012-10-25: qty 45

## 2012-10-25 MED ORDER — HEPARIN SOD (PORK) LOCK FLUSH 100 UNIT/ML IV SOLN
500.0000 [IU] | Freq: Once | INTRAVENOUS | Status: AC | PRN
  Administered 2012-10-25: 500 [IU]
  Filled 2012-10-25: qty 5

## 2012-10-25 MED ORDER — SODIUM CHLORIDE 0.9 % IV SOLN
Freq: Once | INTRAVENOUS | Status: AC
  Administered 2012-10-25: 09:00:00 via INTRAVENOUS

## 2012-10-25 MED ORDER — SODIUM CHLORIDE 0.9 % IJ SOLN
10.0000 mL | INTRAMUSCULAR | Status: DC | PRN
  Administered 2012-10-25: 10 mL
  Filled 2012-10-25: qty 10

## 2012-10-25 NOTE — Patient Instructions (Addendum)
[facility] Discharge Instructions for Patients Receiving Chemotherapy  Today you received the following chemotherapy agents Abraxane  To help prevent nausea and vomiting after your treatment, we encourage you to take your nausea medication as prescribed.   If you develop nausea and vomiting that is not controlled by your nausea medication, call the clinic. If it is after clinic hours your family physician or the after hours number for the clinic or go to the Emergency Department.   BELOW ARE SYMPTOMS THAT SHOULD BE REPORTED IMMEDIATELY:  *FEVER GREATER THAN 100.5 F  *CHILLS WITH OR WITHOUT FEVER  NAUSEA AND VOMITING THAT IS NOT CONTROLLED WITH YOUR NAUSEA MEDICATION  *UNUSUAL SHORTNESS OF BREATH  *UNUSUAL BRUISING OR BLEEDING  TENDERNESS IN MOUTH AND THROAT WITH OR WITHOUT PRESENCE OF ULCERS  *URINARY PROBLEMS  *BOWEL PROBLEMS  UNUSUAL RASH Items with * indicate a potential emergency and should be followed up as soon as possible.  Feel free to call the clinic you have any questions or concerns. The clinic phone number is [phone].   I have been informed and understand all the instructions given to me. I know to contact the clinic, my physician, or go to the Emergency Department if any problems should occur. I do not have any questions at this time, but understand that I may call the clinic during office hours   should I have any questions or need assistance in obtaining follow up care.    __________________________________________  _____________  __________ Signature of Patient or Authorized Representative            Date                   Time    __________________________________________ Nurse's Signature    

## 2012-10-26 ENCOUNTER — Ambulatory Visit: Payer: Medicare Other | Admitting: *Deleted

## 2012-10-28 NOTE — Assessment & Plan Note (Signed)
Patient seen and examined with Ulyess Blossom, PA-C. We discussed all aspects of the encounter. I agree with the assessment as stated above. My thoughts below.  I reviewed echos personally. EF and Doppler parameters back to baseline. No HF on exam. Can resume Herceptin and we will follow closely. Would restart spiro given lymphedema.

## 2012-11-02 ENCOUNTER — Ambulatory Visit: Payer: PRIVATE HEALTH INSURANCE | Admitting: Physical Therapy

## 2012-11-04 ENCOUNTER — Other Ambulatory Visit: Payer: Self-pay | Admitting: Oncology

## 2012-11-04 NOTE — Progress Notes (Signed)
Jeanette Morris's EF has recovered and we are resuming trastuzumab. Orders entered

## 2012-11-07 ENCOUNTER — Other Ambulatory Visit (HOSPITAL_BASED_OUTPATIENT_CLINIC_OR_DEPARTMENT_OTHER): Payer: PRIVATE HEALTH INSURANCE | Admitting: Lab

## 2012-11-07 ENCOUNTER — Telehealth: Payer: Self-pay | Admitting: Oncology

## 2012-11-07 ENCOUNTER — Encounter: Payer: Self-pay | Admitting: Physician Assistant

## 2012-11-07 ENCOUNTER — Ambulatory Visit (HOSPITAL_BASED_OUTPATIENT_CLINIC_OR_DEPARTMENT_OTHER): Payer: PRIVATE HEALTH INSURANCE | Admitting: Physician Assistant

## 2012-11-07 VITALS — BP 138/82 | HR 79 | Temp 98.5°F | Resp 20 | Ht 65.0 in | Wt 236.0 lb

## 2012-11-07 DIAGNOSIS — C50619 Malignant neoplasm of axillary tail of unspecified female breast: Secondary | ICD-10-CM

## 2012-11-07 DIAGNOSIS — C50912 Malignant neoplasm of unspecified site of left female breast: Secondary | ICD-10-CM

## 2012-11-07 LAB — CBC WITH DIFFERENTIAL/PLATELET
Basophils Absolute: 0 10*3/uL (ref 0.0–0.1)
EOS%: 0.9 % (ref 0.0–7.0)
HCT: 28.1 % — ABNORMAL LOW (ref 34.8–46.6)
HGB: 8.9 g/dL — ABNORMAL LOW (ref 11.6–15.9)
LYMPH%: 31.5 % (ref 14.0–49.7)
MCH: 28.4 pg (ref 25.1–34.0)
NEUT%: 53.4 % (ref 38.4–76.8)
Platelets: 206 10*3/uL (ref 145–400)
lymph#: 0.7 10*3/uL — ABNORMAL LOW (ref 0.9–3.3)

## 2012-11-07 LAB — COMPREHENSIVE METABOLIC PANEL (CC13)
Albumin: 3.2 g/dL — ABNORMAL LOW (ref 3.5–5.0)
BUN: 17.6 mg/dL (ref 7.0–26.0)
Calcium: 9.4 mg/dL (ref 8.4–10.4)
Chloride: 106 mEq/L (ref 98–107)
Glucose: 112 mg/dl — ABNORMAL HIGH (ref 70–99)
Potassium: 4.5 mEq/L (ref 3.5–5.1)

## 2012-11-07 NOTE — H&P (Signed)
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 She continues to undergo treatment for that cancer. The Pleurx catheter has been drained every Monday and is now draining about 90 cc once a week. She said that she is feeling fairly well and denies any shortness of breath.  Current Outpatient Prescriptions   Medication  Sig  Dispense  Refill   .  aspirin 81 MG tablet  Take 81 mg by mouth daily.     .  Calcium Carbonate-Vitamin D 600-400 MG-UNIT per tablet  Take 1 tablet by mouth every morning.     .  carvedilol (COREG) 12.5 MG tablet  Take 12.5 mg by mouth every morning. Take With 6.25 mg tablet to make a total strength of 18.75mg      .  carvedilol (COREG) 6.25 MG tablet  Take 1 tablet (6.25 mg total) by mouth every morning. Take With 12.5 mg tablet to make a total strength of 18.75mg   60 tablet  3   .  ferrous sulfate 325 (65 FE) MG tablet  Take 1 tablet (325 mg total) by mouth daily with breakfast.  30 tablet  1   .  gabapentin (NEURONTIN) 300 MG capsule  Take 300 mg by mouth at bedtime.     .  ondansetron (ZOFRAN) 8 MG tablet  Take by mouth every 8 (eight) hours as needed. After chemo tx's     .  oxyCODONE-acetaminophen (PERCOCET/ROXICET) 5-325 MG per tablet  Take 1 tablet by mouth every 4 (four) hours as needed for pain.  30 tablet  0   .  prochlorperazine (COMPAZINE) 10 MG tablet  Take 10 mg by mouth every 6 (six) hours as needed. After chemo tx's     .  vitamin B-12 (CYANOCOBALAMIN) 100 MCG tablet  Take 50 mcg by mouth daily.     Physical Exam:  BP 159/73  Pulse 83  Resp 20  Ht 5\' 5"  (1.651 m)  Wt 234 lb (106.142 kg)  BMI 38.94 kg/m2  SpO2 98%  She looks well.  Lung exam is clear.  The right Pleurx catheter site looks fine.  Diagnostic Tests:  *RADIOLOGY REPORT*  Clinical Data: Follow up pleural effusion.  CHEST - 2 VIEW  Comparison: 09/17/2012.   Findings: The cardiac silhouette, mediastinal and hilar contours  are stable. The left subclavian Port-A-Cath is unchanged. The tip  is against the lateral wall of the SVC. The thyroid goiter is again  noted with displacement of the trachea to the right. A Pleurx  drainage catheter is in place on the right side. No complicating  features. No recurrent pleural effusion. 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  IMPRESSION:  1. No acute cardiopulmonary findings.  2. Stable thyroid goiter.  3. Stable left subclavian Port-A-Cath.  4. Stable right-sided Pleurx drainage catheter without recurrent  pleural effusion.  Original Report Authenticated By: Rudie Meyer, M.D.  Impression:  The recurrent right pleural effusion has resolved. There is minimal drainage from the Pleurx catheter.  Plan:  We'll schedule removal of her Pleurx catheter next Wednesday 10/10/12.

## 2012-11-07 NOTE — Progress Notes (Signed)
ID: Jeanette Morris   DOB: 24-Feb-1946  MR#: [id number]  CSN#:[id number]  PCP: Egbert Garibaldi, NP GYN:  SUFelicity Pellegrini OTHER MD: Arvilla Meres, Antony Blackbird, Rexanne Mano   HISTORY OF PRESENT ILLNESS: The patient undergoes annual screening mammography.  Her mammograms previously had fullness.  In August 2011 she presented with some firmness of her right breast. She also noted some skin thickening around her nipple and some tenderness.  She was subsequently seen for this on 05/13/2010 and was referred for bilateral mammogram and right breast ultrasound.  There was concern that this may be mastitis.  There were diffuse skin thickening, tissue edema, enlarged lymph node in right axilla, some subareolar distortion secondary to the previous surgery.  She was given a course of doxycycline for 10 days and asked to return. A follow-up ultrasound of the breast showed persistent abnormalities.  She was referred for biopsy.  She underwent biopsy of the right axilla on 05/27/2010 and it showed metastatic carcinoma consistent with breast primary.  This was ER and PR positive, HER-2 was positive with a ratio of 3.09.  The ER ratio is 34%, PR was 64%, proliferative index 60%.  She subsequently had a MRI scan of both breasts on 06/03/2010 and this showed a area of minimal enhancement in the right breast measuring 10.0 x 6.0 x 3.0 cm.  Enlarged lymph nodes were seen in the right axilla.  Largest node measuring 3.0 x 1.0 x 7.0 cm.  Intramammary lymph nodes were also seen in the outer left breast, the largest measuring 2.8 x 2.5 cm.  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 The patient's subsequent history is as detailed below.  INTERVAL HISTORY: Jeanette Morris returns today  for followup of her breast cancer. She is due for day 1 cycle 5 of Abraxane tomorrow, 11/08/2012, with the Abraxane given on days 1 and 8 of each 21 day cycle. She continues to tolerate the  Abraxane extremely well with "no side effects at all".   Interval history is remarkable for a  recent echocardiogram and followup with [name], and he has cleared her to restart trastuzumab. The plan is to restart the trastuzumab with day 1 of Abraxane today.  Jeanette Morris continues to deny any increased shortness of breath. She's had no cough and denies any orthopnea or PND.   REVIEW OF SYSTEMS: Jeanette Morris denies any fevers or chills. She's had no skin changes. She denies abnormal bleeding. Her appetite is slightly reduced but she is keeping herself well hydrated and denies any nausea or emesis. She's had no diarrhea, constipation, or change in urinary habits. She also denies any chest pain or palpitations. No abnormal headaches or dizziness. She continues to have lymphedema affecting the right upper extremity and is scheduled to be seen at the lymphedema clinic tomorrow.   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  A detailed review of systems is otherwise stable and noncontributory.  PAST MEDICAL HISTORY: Past Medical History  Diagnosis Date  . Hypertension   . Hearing loss     right ear  . Hyperlipidemia   . Arthritis   . 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 Breast cancer     (Rt) breast ca dx 9/11  . Dysrhythmia     A fib with IV chemo treatments  .  Shortness of breath     PAST SURGICAL HISTORY: Past Surgical History  Procedure Laterality Date  . Tubal ligation    . Portacath placement    . Abdominal hysterectomy    . Lung biopsy    . Lesion excision      L anterior thigh  . Breast surgery  12/01/10    ER+,PR+,Ki 67 64%,Her2 -; right breast lumpectomy  . Chest tube insertion  08/08/2012    Procedure: INSERTION PLEURAL DRAINAGE CATHETER;  Surgeon: Alleen Borne, MD;  Location: [facility] OR;  Service: Thoracic;  Laterality: Right;  . Removal of pleural drainage  catheter Right 10/10/2012    Procedure: REMOVAL OF PLEURAL DRAINAGE CATHETER;  Surgeon: Alleen Borne, MD;  Location: [facility] OR;  Service: Thoracic;  Laterality: Right;    FAMILY HISTORY No family history on file. The patient's father died in an automobile accident at age 13. The patient's mother died at age 56 with Alzheimer's disease. The patient had 8 brothers and 4 sisters. There is no history of breast or ovarian cancer in the family.  GYNECOLOGIC HISTORY: Menarche age 12, first live birth age 33. She is GX P7. She underwent menopause "more than 20 years ago". She never took hormone replacement.  SOCIAL HISTORY: She has been mostly a housewife. Her husband [name] used to be a cook at [employer]. The patient's children are from an earlier marriage, and include Kerri Perches, [place], [address], [address], Danielle Sharon, [place], and [employer]. The patient has 15 grandchildren and 1 great-grandchild. She at tends [place]. [employer]  ADVANCED DIRECTIVES: Not in place  HEALTH MAINTENANCE: History  Substance Use Topics  . Smoking status: Former Smoker    Quit date: 06/30/1973  . Smokeless tobacco: Never Used  . Alcohol Use: No     Colonoscopy:  PAP:  Bone density:  Lipid panel:  No Known Allergies  Current Outpatient Prescriptions  Medication Sig Dispense Refill  . aspirin 81 MG tablet Take 81 mg by mouth daily.      . Calcium Carbonate-Vitamin D 600-400 MG-UNIT per tablet Take 1 tablet by mouth every morning.       . carvedilol (COREG) 12.5 MG tablet Take 12.5 mg by mouth every morning. Take With 6.25 mg tablet to make a total strength of 18.75mg       . carvedilol (COREG) 6.25 MG tablet Take 1 tablet (6.25 mg total) by mouth every morning. Take With 12.5 mg tablet to make a total strength of 18.75mg   60 tablet  3  . cyanocobalamin 500 MCG tablet Take 500 mcg by mouth daily.      . ferrous sulfate 325 (65 FE) MG tablet Take 1 tablet (325 mg  total) by mouth daily with breakfast.  30 tablet  1  . gabapentin (NEURONTIN) 300 MG capsule Take 300 mg by mouth at bedtime.       . ondansetron (ZOFRAN) 8 MG tablet Take by mouth every 8 (eight) hours as needed. After chemo tx's      . oxyCODONE-acetaminophen (PERCOCET/ROXICET) 5-325 MG per tablet Take 1 tablet by mouth every 8 (eight) hours as needed for pain.  90 tablet  0  . prochlorperazine (COMPAZINE) 10 MG tablet Take 10 mg by mouth every 6 (six) hours as needed. After chemo tx's      . spironolactone (ALDACTONE) 25 MG tablet Take 0.5 tablets (12.5 mg total) by mouth daily.  15 tablet  6   No current facility-administered medications for  this visit.    OBJECTIVE: Elderly African American woman in no acute distress Filed Vitals:   11/07/12 1026  BP: 138/82  Pulse: 79  Temp: 98.5 F (36.9 C)  Resp: 20   Filed Weights   11/07/12 1026  Weight: 236 lb (107.049 kg)      Body mass index is 39.27 kg/(m^2).    ECOG FS: 2  Sclerae unicteric Oropharynx clear No cervical or supraclavicular adenopathy  Lungs no rales or rhonchi; breath sounds are mildly diminished bilaterally in the bases. Heart regular rate and rhythm, no murmur appreciated Abdomen  obese, soft, nontender, positive bowel sounds MSK no focal spinal tenderness; grade 1-2 right upper extremity lymphedema; no additional peripheral edema, and specifically no edema in the lower extremities. Neuro: nonfocal, well oriented, positive affect Breasts: Deferred. Axillae are benign bilaterally with no palpable adenopathy.  LAB RESULTS: Lab Results  Component Value Date   WBC 2.2* 11/07/2012   NEUTROABS 1.2* 11/07/2012   HGB 8.9* 11/07/2012   HCT 28.1* 11/07/2012   MCV 89.8 11/07/2012   PLT 206 11/07/2012      Chemistry      Component Value Date/Time   NA 140 10/18/2012 0940   NA 139 08/08/2012 0658   K 4.0 10/18/2012 0940   K 3.6 08/08/2012 0658   CL 107 10/18/2012 0940   CL 103 08/08/2012 0658   CO2 26 10/18/2012 0940    CO2 28 08/08/2012 0658   BUN 14.6 10/18/2012 0940   BUN 15 08/08/2012 0658   CREATININE 1.0 10/18/2012 0940   CREATININE 1.56* 08/08/2012 0658      Component Value Date/Time   CALCIUM 8.9 10/18/2012 0940   CALCIUM 9.7 08/08/2012 0658   ALKPHOS 66 10/18/2012 0940   ALKPHOS 91 08/08/2012 0658   AST 13 10/18/2012 0940   AST 86* 08/08/2012 0658   ALT 9 10/18/2012 0940   ALT 117* 08/08/2012 0658   BILITOT 0.37 10/18/2012 0940   BILITOT 0.3 08/08/2012 0658       Lab Results  Component Value Date   LABCA2 95* 08/14/2012    STUDIES:  Echocardiogram on 10/22/2012 showed an ejection fraction of 45-50%.  Dg Chest 2 View  10/05/2012  *RADIOLOGY REPORT*  Clinical Data: Follow up pleural effusion.  CHEST - 2 VIEW  Comparison: 09/17/2012.  Findings: The cardiac silhouette, mediastinal and hilar contours are stable.  The left subclavian Port-A-Cath is unchanged.  The tip is against the lateral wall of the SVC. The thyroid goiter is again noted with displacement of the trachea to the right.  A Pleurx drainage catheter is in place on the right side.  No complicating features.  No recurrent pleural effusion.  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  IMPRESSION:  1.  No acute cardiopulmonary findings. 2.  Stable thyroid goiter. 3.  Stable left subclavian Port-A-Cath. 4.  Stable right-sided Pleurx drainage catheter without recurrent pleural effusion.   Original Report Authenticated By: Rudie Meyer, M.D.      ASSESSMENT: 67 y.o. [place] woman with stage 4 breast cancer  (1) status post RIGHT axillary lymph node biopsy 05/27/2010 for a clinical T4 N1, stage IIIB (inflammatory) invasive ductal carcinoma, grade not stated, estrogen receptor 94% positive, progesterone receptor 64% positive, with an MIB-1 of 60%, and HER-2 amplification by FISH with a ratio of 7.5.  (2) neoadjuvant chemotherapy consisted of 6 cycles of standard carboplatin, docetaxel, trastuzumab, with the trastuzumab  held cycles 5 and 6 due to a drop in her  borderline ejection fraction (from 45-50% at baseline to 40-45% January 2012)-- total trastuzumab treatment = 2 months  (3) status post right mastectomy and axillary lymph node dissection 12/01/2010, showing no residual invasive carcinoma in the breast, but residual tumor in 20 of 21 axillary lymph nodes. (Only one of the 20 positive lymph nodes showed a significant residual tumor, measuring 5 mm; the remaining lymph nodes, including the evidence of extracapsular extension, showed single and clusters a few neoplastic cells with extensive therapy related changes).  (4) postmastectomy radiation to the right chest wall, axilla, and supraclavicular region completed 03/07/2011  (5) additional 2 months of trastuzumab given February through April 2013, again discontinued because of a drop in the ejection fraction and lateral S'; most recent echo 07/23/2012 showed an ejection fraction of 55%  (6) on letrozole July 2012 to September 2013  (7) LEFT axillary adenopathy noted by Dr. Carolynne Edouard on exam July 2013, with biopsy of a left axillary node 04/17/2012 showing an invasive ductal carcinoma, estrogen receptor 91% positive, progesterone receptor 9% positive, with an MIB-1 of 45%, and no HER-2 amplification. This tumor is morphologically identical to her prior Right breast cancer.  (8) PET scan 04/03/2012 showed multiple enlarged left axillary and small left supraclavicular lymph nodes that are moderately hypermetabolic, with some hypermetabolic activity within the left axillary tail. No other hypermetabolic lymph nodes identified. There was no involvement of the right axilla, mediastinum or right chest wall and no extrathoracic metastases were demonstrated.  (8) on exemestane and everolimus September through December 2013  (9) s/p RIGHT thoracentesis 08/01/2012, cytologically positive  (10) Abraxane started 08/16/2012, given day 1 and day 8 of each 21 day cycle  PLAN:   Lovena continues to tolerate the Abraxane well, and we'll proceed for day 1 cycle 5 today as scheduled. We were ready to resume her trastuzumab today, but she would like to wait until she completes these last 2 cycles of Abraxane, then consider a repeat echocardiogram before resuming trastuzumab in late April.  We will schedule all of these appointments for her accordingly, and she'll see Dr. Darnelle Catalan on April 24 to discuss her plan further.   With regards to the Abraxane, she will have a total of 6 cycles, completed 12/06/2012, and we will restage her at that time. Most likely we will switch back to antiestrogen then, specifically fulvestrant. In the meantime she knows to call for any problems that may develop before the next visit.  BERRY,[name]    11/07/2012

## 2012-11-07 NOTE — Telephone Encounter (Signed)
gv pt appt schedule for March and April including appt for echo/bensimhin 4/21.

## 2012-11-08 ENCOUNTER — Ambulatory Visit (HOSPITAL_BASED_OUTPATIENT_CLINIC_OR_DEPARTMENT_OTHER): Payer: PRIVATE HEALTH INSURANCE

## 2012-11-08 ENCOUNTER — Ambulatory Visit: Payer: PRIVATE HEALTH INSURANCE | Attending: Oncology

## 2012-11-08 VITALS — BP 151/60 | HR 74 | Temp 98.2°F | Resp 20

## 2012-11-08 DIAGNOSIS — C50619 Malignant neoplasm of axillary tail of unspecified female breast: Secondary | ICD-10-CM

## 2012-11-08 DIAGNOSIS — M24519 Contracture, unspecified shoulder: Secondary | ICD-10-CM | POA: Insufficient documentation

## 2012-11-08 DIAGNOSIS — C773 Secondary and unspecified malignant neoplasm of axilla and upper limb lymph nodes: Secondary | ICD-10-CM

## 2012-11-08 DIAGNOSIS — Z5111 Encounter for antineoplastic chemotherapy: Secondary | ICD-10-CM

## 2012-11-08 DIAGNOSIS — J910 Malignant pleural effusion: Secondary | ICD-10-CM

## 2012-11-08 DIAGNOSIS — I890 Lymphedema, not elsewhere classified: Secondary | ICD-10-CM | POA: Insufficient documentation

## 2012-11-08 DIAGNOSIS — IMO0001 Reserved for inherently not codable concepts without codable children: Secondary | ICD-10-CM | POA: Insufficient documentation

## 2012-11-08 DIAGNOSIS — M25619 Stiffness of unspecified shoulder, not elsewhere classified: Secondary | ICD-10-CM | POA: Insufficient documentation

## 2012-11-08 DIAGNOSIS — C50912 Malignant neoplasm of unspecified site of left female breast: Secondary | ICD-10-CM

## 2012-11-08 MED ORDER — HEPARIN SOD (PORK) LOCK FLUSH 100 UNIT/ML IV SOLN
500.0000 [IU] | Freq: Once | INTRAVENOUS | Status: AC | PRN
  Administered 2012-11-08: 500 [IU]
  Filled 2012-11-08: qty 5

## 2012-11-08 MED ORDER — SODIUM CHLORIDE 0.9 % IV SOLN
Freq: Once | INTRAVENOUS | Status: AC
  Administered 2012-11-08: 09:00:00 via INTRAVENOUS

## 2012-11-08 MED ORDER — DEXAMETHASONE SODIUM PHOSPHATE 10 MG/ML IJ SOLN
4.0000 mg | Freq: Once | INTRAMUSCULAR | Status: AC
  Administered 2012-11-08: 4 mg via INTRAVENOUS

## 2012-11-08 MED ORDER — ONDANSETRON 8 MG/50ML IVPB (CHCC)
8.0000 mg | Freq: Once | INTRAVENOUS | Status: AC
  Administered 2012-11-08: 8 mg via INTRAVENOUS

## 2012-11-08 MED ORDER — SODIUM CHLORIDE 0.9 % IJ SOLN
10.0000 mL | INTRAMUSCULAR | Status: DC | PRN
  Administered 2012-11-08: 10 mL
  Filled 2012-11-08: qty 10

## 2012-11-08 MED ORDER — PACLITAXEL PROTEIN-BOUND CHEMO INJECTION 100 MG
100.0000 mg/m2 | Freq: Once | INTRAVENOUS | Status: AC
  Administered 2012-11-08: 225 mg via INTRAVENOUS
  Filled 2012-11-08: qty 45

## 2012-11-08 NOTE — Progress Notes (Signed)
9562 Per Zollie Scale, PA, ok to treat today with anc @ 1.2

## 2012-11-08 NOTE — Patient Instructions (Addendum)
[facility] Discharge Instructions for Patients Receiving Chemotherapy  Today you received the following chemotherapy agent Abraxane  To help prevent nausea and vomiting after your treatment, we encourage you to take your nausea medication. Begin taking your nausea medication as often as prescribed for by Dr. Darnelle Catalan.    If you develop nausea and vomiting that is not controlled by your nausea medication, call the clinic. If it is after clinic hours your family physician or the after hours number for the clinic or go to the Emergency Department.   BELOW ARE SYMPTOMS THAT SHOULD BE REPORTED IMMEDIATELY:  *FEVER GREATER THAN 100.5 F  *CHILLS WITH OR WITHOUT FEVER  NAUSEA AND VOMITING THAT IS NOT CONTROLLED WITH YOUR NAUSEA MEDICATION  *UNUSUAL SHORTNESS OF BREATH  *UNUSUAL BRUISING OR BLEEDING  TENDERNESS IN MOUTH AND THROAT WITH OR WITHOUT PRESENCE OF ULCERS  *URINARY PROBLEMS  *BOWEL PROBLEMS  UNUSUAL RASH Items with * indicate a potential emergency and should be followed up as soon as possible.  One of the nurses will contact you 24 hours after your treatment. Please let the nurse know about any problems that you may have experienced. Feel free to call the clinic you have any questions or concerns. The clinic phone number is [phone].   I have been informed and understand all the instructions given to me. I know to contact the clinic, my physician, or go to the Emergency Department if any problems should occur. I do not have any questions at this time, but understand that I may call the clinic during office hours   should I have any questions or need assistance in obtaining follow up care.    __________________________________________  _____________  __________ Signature of Patient or Authorized Representative            Date                   Time    __________________________________________ Nurse's Signature

## 2012-11-13 ENCOUNTER — Other Ambulatory Visit: Payer: Self-pay | Admitting: *Deleted

## 2012-11-15 ENCOUNTER — Other Ambulatory Visit (HOSPITAL_BASED_OUTPATIENT_CLINIC_OR_DEPARTMENT_OTHER): Payer: Medicaid Other | Admitting: Lab

## 2012-11-15 ENCOUNTER — Ambulatory Visit (HOSPITAL_BASED_OUTPATIENT_CLINIC_OR_DEPARTMENT_OTHER): Payer: Medicaid Other

## 2012-11-15 VITALS — BP 165/67 | HR 74 | Temp 98.0°F | Resp 20

## 2012-11-15 DIAGNOSIS — J910 Malignant pleural effusion: Secondary | ICD-10-CM

## 2012-11-15 DIAGNOSIS — C50912 Malignant neoplasm of unspecified site of left female breast: Secondary | ICD-10-CM

## 2012-11-15 DIAGNOSIS — C50919 Malignant neoplasm of unspecified site of unspecified female breast: Secondary | ICD-10-CM

## 2012-11-15 LAB — CBC WITH DIFFERENTIAL/PLATELET
Basophils Absolute: 0 10*3/uL (ref 0.0–0.1)
Eosinophils Absolute: 0 10*3/uL (ref 0.0–0.5)
HGB: 9.5 g/dL — ABNORMAL LOW (ref 11.6–15.9)
MONO#: 0.4 10*3/uL (ref 0.1–0.9)
NEUT#: 2.1 10*3/uL (ref 1.5–6.5)
RDW: 17.1 % — ABNORMAL HIGH (ref 11.2–14.5)
lymph#: 1 10*3/uL (ref 0.9–3.3)

## 2012-11-15 LAB — COMPREHENSIVE METABOLIC PANEL (CC13)
Alkaline Phosphatase: 69 U/L (ref 40–150)
BUN: 20.4 mg/dL (ref 7.0–26.0)
Glucose: 104 mg/dl — ABNORMAL HIGH (ref 70–99)
Total Bilirubin: 0.28 mg/dL (ref 0.20–1.20)

## 2012-11-15 MED ORDER — DEXAMETHASONE SODIUM PHOSPHATE 10 MG/ML IJ SOLN
4.0000 mg | Freq: Once | INTRAMUSCULAR | Status: AC
  Administered 2012-11-15: 4 mg via INTRAVENOUS

## 2012-11-15 MED ORDER — HEPARIN SOD (PORK) LOCK FLUSH 100 UNIT/ML IV SOLN
500.0000 [IU] | Freq: Once | INTRAVENOUS | Status: AC | PRN
  Administered 2012-11-15: 500 [IU]
  Filled 2012-11-15: qty 5

## 2012-11-15 MED ORDER — SODIUM CHLORIDE 0.9 % IV SOLN
Freq: Once | INTRAVENOUS | Status: AC
  Administered 2012-11-15: 09:00:00 via INTRAVENOUS

## 2012-11-15 MED ORDER — SODIUM CHLORIDE 0.9 % IJ SOLN
10.0000 mL | INTRAMUSCULAR | Status: DC | PRN
  Administered 2012-11-15: 10 mL
  Filled 2012-11-15: qty 10

## 2012-11-15 MED ORDER — PACLITAXEL PROTEIN-BOUND CHEMO INJECTION 100 MG
100.0000 mg/m2 | Freq: Once | INTRAVENOUS | Status: AC
  Administered 2012-11-15: 225 mg via INTRAVENOUS
  Filled 2012-11-15: qty 45

## 2012-11-15 MED ORDER — ONDANSETRON 8 MG/50ML IVPB (CHCC)
8.0000 mg | Freq: Once | INTRAVENOUS | Status: AC
  Administered 2012-11-15: 8 mg via INTRAVENOUS

## 2012-11-15 NOTE — Patient Instructions (Signed)
Patient aware of next appointment; discharged home with no complaints. 

## 2012-11-28 ENCOUNTER — Encounter: Payer: Self-pay | Admitting: *Deleted

## 2012-11-28 NOTE — Progress Notes (Signed)
Per Tami - move 4/2 appts to [place] 4/3.  Did as she requested.

## 2012-11-29 ENCOUNTER — Ambulatory Visit: Payer: Medicare Other | Admitting: Physician Assistant

## 2012-11-29 ENCOUNTER — Ambulatory Visit: Payer: PRIVATE HEALTH INSURANCE | Admitting: Adult Health

## 2012-11-29 ENCOUNTER — Other Ambulatory Visit: Payer: Medicare Other | Admitting: Lab

## 2012-11-29 ENCOUNTER — Encounter: Payer: Self-pay | Admitting: Adult Health

## 2012-11-29 ENCOUNTER — Ambulatory Visit: Payer: PRIVATE HEALTH INSURANCE

## 2012-11-29 ENCOUNTER — Other Ambulatory Visit: Payer: PRIVATE HEALTH INSURANCE | Admitting: Lab

## 2012-11-29 VITALS — BP 149/82 | HR 69 | Temp 98.3°F | Resp 20 | Ht 65.0 in | Wt 238.2 lb

## 2012-11-29 DIAGNOSIS — J910 Malignant pleural effusion: Secondary | ICD-10-CM

## 2012-11-29 DIAGNOSIS — C50912 Malignant neoplasm of unspecified site of left female breast: Secondary | ICD-10-CM

## 2012-11-29 DIAGNOSIS — C50919 Malignant neoplasm of unspecified site of unspecified female breast: Secondary | ICD-10-CM

## 2012-11-29 DIAGNOSIS — C50911 Malignant neoplasm of unspecified site of right female breast: Secondary | ICD-10-CM

## 2012-11-29 LAB — COMPREHENSIVE METABOLIC PANEL (CC13)
ALT: 10 U/L (ref 0–55)
AST: 15 U/L (ref 5–34)
Albumin: 3.1 g/dL — ABNORMAL LOW (ref 3.5–5.0)
Alkaline Phosphatase: 75 U/L (ref 40–150)
BUN: 16.3 mg/dL (ref 7.0–26.0)
Calcium: 8.6 mg/dL (ref 8.4–10.4)
Chloride: 108 mEq/L — ABNORMAL HIGH (ref 98–107)
Creatinine: 1 mg/dL (ref 0.6–1.1)
Potassium: 4.1 mEq/L (ref 3.5–5.1)

## 2012-11-29 LAB — CBC WITH DIFFERENTIAL/PLATELET
BASO%: 0.7 % (ref 0.0–2.0)
Basophils Absolute: 0 10*3/uL (ref 0.0–0.1)
EOS%: 0.7 % (ref 0.0–7.0)
HCT: 28.2 % — ABNORMAL LOW (ref 34.8–46.6)
HGB: 9 g/dL — ABNORMAL LOW (ref 11.6–15.9)
MCH: 28.9 pg (ref 25.1–34.0)
MCHC: 31.9 g/dL (ref 31.5–36.0)
MCV: 90.7 fL (ref 79.5–101.0)
MONO%: 16.5 % — ABNORMAL HIGH (ref 0.0–14.0)
NEUT%: 57.9 % (ref 38.4–76.8)
lymph#: 0.7 10*3/uL — ABNORMAL LOW (ref 0.9–3.3)

## 2012-11-29 MED ORDER — PACLITAXEL PROTEIN-BOUND CHEMO INJECTION 100 MG
100.0000 mg/m2 | Freq: Once | INTRAVENOUS | Status: AC
  Administered 2012-11-29: 225 mg via INTRAVENOUS
  Filled 2012-11-29: qty 45

## 2012-11-29 MED ORDER — SODIUM CHLORIDE 0.9 % IJ SOLN
10.0000 mL | INTRAMUSCULAR | Status: DC | PRN
  Administered 2012-11-29: 10 mL via INTRAVENOUS
  Filled 2012-11-29: qty 10

## 2012-11-29 MED ORDER — ONDANSETRON 8 MG/50ML IVPB (CHCC)
8.0000 mg | Freq: Once | INTRAVENOUS | Status: AC
  Administered 2012-11-29: 8 mg via INTRAVENOUS

## 2012-11-29 MED ORDER — HEPARIN SOD (PORK) LOCK FLUSH 100 UNIT/ML IV SOLN
500.0000 [IU] | Freq: Once | INTRAVENOUS | Status: AC
  Administered 2012-11-29: 500 [IU] via INTRAVENOUS
  Filled 2012-11-29: qty 5

## 2012-11-29 MED ORDER — DEXAMETHASONE SODIUM PHOSPHATE 10 MG/ML IJ SOLN
4.0000 mg | Freq: Once | INTRAMUSCULAR | Status: AC
  Administered 2012-11-29: 4 mg via INTRAVENOUS

## 2012-11-29 MED ORDER — SODIUM CHLORIDE 0.9 % IV SOLN
Freq: Once | INTRAVENOUS | Status: AC
  Administered 2012-11-29: 14:00:00 via INTRAVENOUS

## 2012-11-29 NOTE — Patient Instructions (Addendum)
Doing well.  Once I review your labs I will check you in for your chemotherapy appointment.

## 2012-11-29 NOTE — Patient Instructions (Addendum)
[facility] Discharge Instructions for Patients Receiving Chemotherapy  Today you received the following chemotherapy agents :  Abraxane.  To help prevent nausea and vomiting after your treatment, we encourage you to take your nausea medication as instructed by your physician.    If you develop nausea and vomiting that is not controlled by your nausea medication, call the clinic. If it is after clinic hours your family physician or the after hours number for the clinic or go to the Emergency Department.   BELOW ARE SYMPTOMS THAT SHOULD BE REPORTED IMMEDIATELY:  *FEVER GREATER THAN 100.5 F  *CHILLS WITH OR WITHOUT FEVER  NAUSEA AND VOMITING THAT IS NOT CONTROLLED WITH YOUR NAUSEA MEDICATION  *UNUSUAL SHORTNESS OF BREATH  *UNUSUAL BRUISING OR BLEEDING  TENDERNESS IN MOUTH AND THROAT WITH OR WITHOUT PRESENCE OF ULCERS  *URINARY PROBLEMS  *BOWEL PROBLEMS  UNUSUAL RASH Items with * indicate a potential emergency and should be followed up as soon as possible.  One of the nurses will contact you 24 hours after your treatment. Please let the nurse know about any problems that you may have experienced. Feel free to call the clinic you have any questions or concerns. The clinic phone number is [phone].   I have been informed and understand all the instructions given to me. I know to contact the clinic, my physician, or go to the Emergency Department if any problems should occur. I do not have any questions at this time, but understand that I may call the clinic during office hours   should I have any questions or need assistance in obtaining follow up care.    __________________________________________  _____________  __________ Signature of Patient or Authorized Representative            Date                   Time    __________________________________________ Nurse's Signature

## 2012-11-29 NOTE — Progress Notes (Signed)
Ok to treat with WBC 2.7 today, verbal order received and read back from Augustin Schooling, NP.

## 2012-11-29 NOTE — Progress Notes (Signed)
ID: Jeanette Morris   DOB: 1946-03-13  MR#: [id number]  CSN#:[id number]  PCP: Jeanette Garibaldi, NP GYN:  Jeanette Morris OTHER MD: Jeanette Morris, Jeanette Morris, Jeanette Morris   HISTORY OF PRESENT ILLNESS: The patient undergoes annual screening mammography.  Her mammograms previously had fullness.  In August 2011 she presented with some firmness of her right breast. She also noted some skin thickening around her nipple and some tenderness.  She was subsequently seen for this on 05/13/2010 and was referred for bilateral mammogram and right breast ultrasound.  There was concern that this may be mastitis.  There were diffuse skin thickening, tissue edema, enlarged lymph node in right axilla, some subareolar distortion secondary to the previous surgery.  She was given a course of doxycycline for 10 days and asked to return. A follow-up ultrasound of the breast showed persistent abnormalities.  She was referred for biopsy.  She underwent biopsy of the right axilla on 05/27/2010 and it showed metastatic carcinoma consistent with breast primary.  This was ER and PR positive, HER-2 was positive with a ratio of 3.09.  The ER ratio is 34%, PR was 64%, proliferative index 60%.  She subsequently had a MRI scan of both breasts on 06/03/2010 and this showed a area of minimal enhancement in the right breast measuring 10.0 x 6.0 x 3.0 cm.  Enlarged lymph nodes were seen in the right axilla.  Largest node measuring 3.0 x 1.0 x 7.0 cm.  Intramammary lymph nodes were also seen in the outer left breast, the largest measuring 2.8 x 2.5 cm.  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 The patient's subsequent history is as detailed below.  INTERVAL HISTORY: Jeanette Morris returns today  for followup of her breast cancer.  She is doing very well.  She is here for her next treatment of Abraxane.  She denies fevers, chills, nausea, vomiting, fatigue, numbness, constipation,  diarrhea, shortness of breath, chest pain, palpitations, or any concerns.  She is ready to proceed with chemotherapy.  REVIEW OF SYSTEMS: General: fatigue (-), night sweats (-), fever (-), pain (-) Lymph: palpable nodes (-) HEENT: vision changes (-), mucositis (-), gum bleeding (-), epistaxis (-) Cardiovascular: chest pain (-), palpitations (-) Pulmonary: shortness of breath (-), dyspnea on exertion (-), cough (-), hemoptysis (-) GI:  Early satiety (-), melena (-), dysphagia (-), nausea/vomiting (-), diarrhea (-) GU: dysuria (-), hematuria (-), incontinence (-) Musculoskeletal: joint swelling (-), joint pain (-), back pain (-) Neuro: weakness (-), numbness (-), headache (-), confusion (-) Skin: Rash (-), lesions (-), dryness (-) Psych: depression (-), suicidal/homicidal ideation (-), feeling of hopelessness (-) A detailed review of systems is otherwise stable and noncontributory.  PAST MEDICAL HISTORY: Past Medical History  Diagnosis Date  . Hypertension   . Hearing loss     right ear  . Hyperlipidemia   . Arthritis   . 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 Breast cancer     (Rt) breast ca dx 9/11  . Dysrhythmia     A fib with IV chemo treatments  . Shortness of breath     PAST SURGICAL HISTORY: Past Surgical History  Procedure Laterality Date  . Tubal ligation    . Portacath placement    . Abdominal hysterectomy    . Lung biopsy    . Lesion excision      L anterior thigh  . Breast surgery  12/01/10    ER+,PR+,Ki 67 64%,Her2 -; right breast lumpectomy  . Chest tube insertion  08/08/2012    Procedure: INSERTION PLEURAL DRAINAGE CATHETER;  Surgeon: Jeanette Borne, MD;  Location: [facility] OR;  Service: Thoracic;  Laterality: Right;  . Removal of pleural drainage catheter Right 10/10/2012    Procedure: REMOVAL OF PLEURAL DRAINAGE CATHETER;  Surgeon: Jeanette Borne, MD;  Location: [facility] OR;  Service: Thoracic;  Laterality: Right;    FAMILY HISTORY No family history on  file. The patient's father died in an automobile accident at age 2. The patient's mother died at age 25 with Alzheimer's disease. The patient had 8 brothers and 4 sisters. There is no history of breast or ovarian cancer in the family.  GYNECOLOGIC HISTORY: Menarche age 76, first live birth age 49. She is GX P7. She underwent menopause "more than 20 years ago". She never took hormone replacement.  SOCIAL HISTORY: She has been mostly a housewife. Her husband Jeanette Morris used to be a cook at [employer]. The patient's children are from an earlier marriage, and include Jeanette Morris, Jeanette Morris, [address], [address], Jeanette Morris, Jeanette Morris, and [employer]. The patient has 15 grandchildren and 1 great-grandchild. She at tends [place]. [employer]  ADVANCED DIRECTIVES: Not in place  HEALTH MAINTENANCE: History  Substance Use Topics  . Smoking status: Former Smoker    Quit date: 06/30/1973  . Smokeless tobacco: Never Used  . Alcohol Use: No     Colonoscopy:  PAP:  Bone density:  Lipid panel:  No Known Allergies  Current Outpatient Prescriptions  Medication Sig Dispense Refill  . aspirin 81 MG tablet Take 81 mg by mouth daily.      . Calcium Carbonate-Vitamin D 600-400 MG-UNIT per tablet Take 1 tablet by mouth every morning.       . carvedilol (COREG) 12.5 MG tablet Take 12.5 mg by mouth every morning. Take With 6.25 mg tablet to make a total strength of 18.75mg       . carvedilol (COREG) 6.25 MG tablet Take 1 tablet (6.25 mg total) by mouth every morning. Take With 12.5 mg tablet to make a total strength of 18.75mg   60 tablet  3  . cyanocobalamin 500 MCG tablet Take 500 mcg by mouth daily.      . ferrous sulfate 325 (65 FE) MG tablet Take 1 tablet (325 mg total) by mouth daily with breakfast.  30 tablet  1  . gabapentin (NEURONTIN) 300 MG capsule Take 300 mg by mouth at bedtime.       . lidocaine-prilocaine (EMLA) cream       . ondansetron (ZOFRAN) 8 MG tablet  Take by mouth every 8 (eight) hours as needed. After chemo tx's      . oxyCODONE-acetaminophen (PERCOCET/ROXICET) 5-325 MG per tablet Take 1 tablet by mouth every 8 (eight) hours as needed for pain.  90 tablet  0  . prochlorperazine (COMPAZINE) 10 MG tablet Take 10 mg by mouth every 6 (six) hours as needed. After chemo tx's      . spironolactone (ALDACTONE) 25 MG tablet Take 0.5 tablets (12.5 mg total) by mouth daily.  15 tablet  6   No current facility-administered medications for this visit.    OBJECTIVE: Elderly African American woman in no acute distress Filed Vitals:   11/29/12 1123  BP: 149/82  Pulse: 69  Temp: 98.3 F (36.8 C)  Resp: 20   Filed Weights   11/29/12 1123  Weight: 238 lb 3.2  oz (108.047 kg)      Body mass index is 39.64 kg/(m^2).    ECOG FS: 2  Sclerae unicteric Oropharynx clear No cervical or supraclavicular adenopathy  Lungs no rales or rhonchi; breath sounds are mildly diminished bilaterally in the bases. Heart regular rate and rhythm, no murmur appreciated Abdomen  obese, soft, nontender, positive bowel sounds MSK no focal spinal tenderness; grade 1-2 right upper extremity lymphedema; no additional peripheral edema, and specifically no edema in the lower extremities. Neuro: nonfocal, well oriented, positive affect Breasts: Deferred. Axillae are benign bilaterally with no palpable adenopathy.  LAB RESULTS: Lab Results  Component Value Date   WBC 3.5* 11/15/2012   NEUTROABS 2.1 11/15/2012   HGB 9.5* 11/15/2012   HCT 29.8* 11/15/2012   MCV 90.0 11/15/2012   PLT 228 11/15/2012      Chemistry      Component Value Date/Time   NA 140 11/15/2012 0812   NA 139 08/08/2012 0658   K 4.4 11/15/2012 0812   K 3.6 08/08/2012 0658   CL 106 11/15/2012 0812   CL 103 08/08/2012 0658   CO2 26 11/15/2012 0812   CO2 28 08/08/2012 0658   BUN 20.4 11/15/2012 0812   BUN 15 08/08/2012 0658   CREATININE 1.1 11/15/2012 0812   CREATININE 1.56* 08/08/2012 0658      Component  Value Date/Time   CALCIUM 8.9 11/15/2012 0812   CALCIUM 9.7 08/08/2012 0658   ALKPHOS 69 11/15/2012 0812   ALKPHOS 91 08/08/2012 0658   AST 15 11/15/2012 0812   AST 86* 08/08/2012 0658   ALT 15 11/15/2012 0812   ALT 117* 08/08/2012 0658   BILITOT 0.28 11/15/2012 0812   BILITOT 0.3 08/08/2012 0658       Lab Results  Component Value Date   LABCA2 95* 08/14/2012    STUDIES:  Echocardiogram on 10/22/2012 showed an ejection fraction of 45-50%.  Dg Chest 2 View  10/05/2012  *RADIOLOGY REPORT*  Clinical Data: Follow up pleural effusion.  CHEST - 2 VIEW  Comparison: 09/17/2012.  Findings: The cardiac silhouette, mediastinal and hilar contours are stable.  The left subclavian Port-A-Cath is unchanged.  The tip is against the lateral wall of the SVC. The thyroid goiter is again noted with displacement of the trachea to the right.  A Pleurx drainage catheter is in place on the right side.  No complicating features.  No recurrent pleural effusion.  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  IMPRESSION:  1.  No acute cardiopulmonary findings. 2.  Stable thyroid goiter. 3.  Stable left subclavian Port-A-Cath. 4.  Stable right-sided Pleurx drainage catheter without recurrent pleural effusion.   Original Report Authenticated By: Rudie Meyer, M.D.      ASSESSMENT: 68 y.o. [place] woman with stage 4 breast cancer  (1) status post RIGHT axillary lymph node biopsy 05/27/2010 for a clinical T4 N1, stage IIIB (inflammatory) invasive ductal carcinoma, grade not stated, estrogen receptor 94% positive, progesterone receptor 64% positive, with an MIB-1 of 60%, and HER-2 amplification by FISH with a ratio of 7.5.  (2) neoadjuvant chemotherapy consisted of 6 cycles of standard carboplatin, docetaxel, trastuzumab, with the trastuzumab held cycles 5 and 6 due to a drop in her borderline ejection fraction (from 45-50% at baseline to 40-45% January 2012)-- total trastuzumab treatment = 2  months  (3) status post right mastectomy and axillary lymph node dissection 12/01/2010, showing no residual invasive carcinoma in the breast, but residual tumor in 20 of 21 axillary lymph  nodes. (Only one of the 20 positive lymph nodes showed a significant residual tumor, measuring 5 mm; the remaining lymph nodes, including the evidence of extracapsular extension, showed single and clusters a few neoplastic cells with extensive therapy related changes).  (4) postmastectomy radiation to the right chest wall, axilla, and supraclavicular region completed 03/07/2011  (5) additional 2 months of trastuzumab given February through April 2013, again discontinued because of a drop in the ejection fraction and lateral S'; most recent echo 07/23/2012 showed an ejection fraction of 55%  (6) on letrozole July 2012 to September 2013  (7) LEFT axillary adenopathy noted by Dr. Carolynne Edouard on exam July 2013, with biopsy of a left axillary node 04/17/2012 showing an invasive ductal carcinoma, estrogen receptor 91% positive, progesterone receptor 9% positive, with an MIB-1 of 45%, and no HER-2 amplification. This tumor is morphologically identical to her prior Right breast cancer.  (8) PET scan 04/03/2012 showed multiple enlarged left axillary and small left supraclavicular lymph nodes that are moderately hypermetabolic, with some hypermetabolic activity within the left axillary tail. No other hypermetabolic lymph nodes identified. There was no involvement of the right axilla, mediastinum or right chest wall and no extrathoracic metastases were demonstrated.  (8) on exemestane and everolimus September through December 2013  (9) s/p RIGHT thoracentesis 08/01/2012, cytologically positive  (10) Abraxane started 08/16/2012, given day 1 and day 8 of each 21 day cycle  PLAN:  Jakyiah continues to tolerate the Abraxane well, and we'll proceed with her next cycle of Abraxane today.  In regards to the Herceptin, she would like to  wait until she completes the last 2 cycles of Abraxane, then consider a repeat echocardiogram before resuming trastuzumab in late April.  We will schedule all of these appointments for her accordingly, and she'll see Dr. Darnelle Catalan on April 24 to discuss her plan further.   With regards to the Abraxane, she will have a total of 6 cycles, completed 12/06/2012, and we will restage her at that time. Most likely we will switch back to antiestrogen then, specifically fulvestrant. In the meantime she knows to call for any problems that may develop before the next visit.  I spent 15 minutes counseling the patient face to face.  The total time spent in the appointment was 30 minutes.  Cherie Ouch Lyn Hollingshead, NP Medical Oncology [facility] Phone: [phone] Augustin Schooling    11/29/2012

## 2012-12-06 ENCOUNTER — Other Ambulatory Visit (HOSPITAL_BASED_OUTPATIENT_CLINIC_OR_DEPARTMENT_OTHER): Payer: PRIVATE HEALTH INSURANCE | Admitting: Lab

## 2012-12-06 ENCOUNTER — Ambulatory Visit (HOSPITAL_BASED_OUTPATIENT_CLINIC_OR_DEPARTMENT_OTHER): Payer: PRIVATE HEALTH INSURANCE

## 2012-12-06 VITALS — BP 148/58 | HR 68 | Temp 97.8°F | Resp 18

## 2012-12-06 DIAGNOSIS — C50911 Malignant neoplasm of unspecified site of right female breast: Secondary | ICD-10-CM

## 2012-12-06 DIAGNOSIS — J910 Malignant pleural effusion: Secondary | ICD-10-CM

## 2012-12-06 DIAGNOSIS — Z5111 Encounter for antineoplastic chemotherapy: Secondary | ICD-10-CM

## 2012-12-06 DIAGNOSIS — C773 Secondary and unspecified malignant neoplasm of axilla and upper limb lymph nodes: Secondary | ICD-10-CM

## 2012-12-06 DIAGNOSIS — C50619 Malignant neoplasm of axillary tail of unspecified female breast: Secondary | ICD-10-CM

## 2012-12-06 DIAGNOSIS — C50912 Malignant neoplasm of unspecified site of left female breast: Secondary | ICD-10-CM

## 2012-12-06 DIAGNOSIS — C50919 Malignant neoplasm of unspecified site of unspecified female breast: Secondary | ICD-10-CM

## 2012-12-06 LAB — CBC WITH DIFFERENTIAL/PLATELET
Basophils Absolute: 0 10*3/uL (ref 0.0–0.1)
EOS%: 1.1 % (ref 0.0–7.0)
HGB: 9.4 g/dL — ABNORMAL LOW (ref 11.6–15.9)
MCH: 29.2 pg (ref 25.1–34.0)
MCHC: 32.3 g/dL (ref 31.5–36.0)
MCV: 90.4 fL (ref 79.5–101.0)
MONO%: 9.8 % (ref 0.0–14.0)
RDW: 15.9 % — ABNORMAL HIGH (ref 11.2–14.5)

## 2012-12-06 MED ORDER — HEPARIN SOD (PORK) LOCK FLUSH 100 UNIT/ML IV SOLN
500.0000 [IU] | Freq: Once | INTRAVENOUS | Status: AC | PRN
  Administered 2012-12-06: 500 [IU]
  Filled 2012-12-06: qty 5

## 2012-12-06 MED ORDER — SODIUM CHLORIDE 0.9 % IJ SOLN
10.0000 mL | INTRAMUSCULAR | Status: DC | PRN
  Administered 2012-12-06: 10 mL
  Filled 2012-12-06: qty 10

## 2012-12-06 MED ORDER — SODIUM CHLORIDE 0.9 % IV SOLN
Freq: Once | INTRAVENOUS | Status: AC
  Administered 2012-12-06: 09:00:00 via INTRAVENOUS

## 2012-12-06 MED ORDER — PACLITAXEL PROTEIN-BOUND CHEMO INJECTION 100 MG
100.0000 mg/m2 | Freq: Once | INTRAVENOUS | Status: AC
  Administered 2012-12-06: 225 mg via INTRAVENOUS
  Filled 2012-12-06: qty 45

## 2012-12-06 MED ORDER — DEXAMETHASONE SODIUM PHOSPHATE 10 MG/ML IJ SOLN
4.0000 mg | Freq: Once | INTRAMUSCULAR | Status: AC
  Administered 2012-12-06: 4 mg via INTRAVENOUS

## 2012-12-06 MED ORDER — ONDANSETRON 8 MG/50ML IVPB (CHCC)
8.0000 mg | Freq: Once | INTRAVENOUS | Status: AC
  Administered 2012-12-06: 8 mg via INTRAVENOUS

## 2012-12-06 NOTE — Patient Instructions (Addendum)
[facility] Discharge Instructions for Patients Receiving Chemotherapy  Today you received the following chemotherapy agents Abraxane  To help prevent nausea and vomiting after your treatment, we encourage you to take your nausea medication as prescribed.   If you develop nausea and vomiting that is not controlled by your nausea medication, call the clinic. If it is after clinic hours your family physician or the after hours number for the clinic or go to the Emergency Department.   BELOW ARE SYMPTOMS THAT SHOULD BE REPORTED IMMEDIATELY:  *FEVER GREATER THAN 100.5 F  *CHILLS WITH OR WITHOUT FEVER  NAUSEA AND VOMITING THAT IS NOT CONTROLLED WITH YOUR NAUSEA MEDICATION  *UNUSUAL SHORTNESS OF BREATH  *UNUSUAL BRUISING OR BLEEDING  TENDERNESS IN MOUTH AND THROAT WITH OR WITHOUT PRESENCE OF ULCERS  *URINARY PROBLEMS  *BOWEL PROBLEMS  UNUSUAL RASH Items with * indicate a potential emergency and should be followed up as soon as possible.  Feel free to call the clinic you have any questions or concerns. The clinic phone number is [phone].   I have been informed and understand all the instructions given to me. I know to contact the clinic, my physician, or go to the Emergency Department if any problems should occur. I do not have any questions at this time, but understand that I may call the clinic during office hours   should I have any questions or need assistance in obtaining follow up care.    __________________________________________  _____________  __________ Signature of Patient or Authorized Representative            Date                   Time    __________________________________________ Nurse's Signature    

## 2012-12-06 NOTE — Progress Notes (Signed)
Ok to treat per Dr. Magrinat 

## 2012-12-17 ENCOUNTER — Ambulatory Visit (HOSPITAL_COMMUNITY)
Admission: RE | Admit: 2012-12-17 | Discharge: 2012-12-17 | Disposition: A | Discharge status: Home / Self Care | Payer: PRIVATE HEALTH INSURANCE | Source: Ambulatory Visit | Attending: Internal Medicine | Admitting: Internal Medicine

## 2012-12-17 ENCOUNTER — Encounter (HOSPITAL_COMMUNITY): Payer: Self-pay | Admitting: Physician Assistant

## 2012-12-17 ENCOUNTER — Ambulatory Visit (HOSPITAL_BASED_OUTPATIENT_CLINIC_OR_DEPARTMENT_OTHER)
Admission: RE | Admit: 2012-12-17 | Discharge: 2012-12-17 | Disposition: A | Discharge status: Home / Self Care | Payer: PRIVATE HEALTH INSURANCE | Source: Ambulatory Visit | Attending: Internal Medicine | Admitting: Internal Medicine

## 2012-12-17 ENCOUNTER — Encounter (HOSPITAL_COMMUNITY): Payer: Self-pay | Admitting: Cardiology

## 2012-12-17 ENCOUNTER — Encounter (INDEPENDENT_AMBULATORY_CARE_PROVIDER_SITE_OTHER): Payer: Medicare Other | Admitting: General Surgery

## 2012-12-17 DIAGNOSIS — C50919 Malignant neoplasm of unspecified site of unspecified female breast: Secondary | ICD-10-CM | POA: Diagnosis present

## 2012-12-17 DIAGNOSIS — J910 Malignant pleural effusion: Secondary | ICD-10-CM

## 2012-12-17 DIAGNOSIS — C50912 Malignant neoplasm of unspecified site of left female breast: Secondary | ICD-10-CM

## 2012-12-17 DIAGNOSIS — I4891 Unspecified atrial fibrillation: Secondary | ICD-10-CM | POA: Insufficient documentation

## 2012-12-17 DIAGNOSIS — M129 Arthropathy, unspecified: Secondary | ICD-10-CM | POA: Insufficient documentation

## 2012-12-17 DIAGNOSIS — E669 Obesity, unspecified: Secondary | ICD-10-CM | POA: Diagnosis not present

## 2012-12-17 DIAGNOSIS — Z9010 Acquired absence of unspecified breast and nipple: Secondary | ICD-10-CM | POA: Diagnosis not present

## 2012-12-17 DIAGNOSIS — I10 Essential (primary) hypertension: Secondary | ICD-10-CM | POA: Diagnosis not present

## 2012-12-17 DIAGNOSIS — Z01810 Encounter for preprocedural cardiovascular examination: Secondary | ICD-10-CM | POA: Diagnosis not present

## 2012-12-17 DIAGNOSIS — Z85828 Personal history of other malignant neoplasm of skin: Secondary | ICD-10-CM | POA: Diagnosis not present

## 2012-12-17 DIAGNOSIS — H9190 Unspecified hearing loss, unspecified ear: Secondary | ICD-10-CM | POA: Diagnosis not present

## 2012-12-17 DIAGNOSIS — Z09 Encounter for follow-up examination after completed treatment for conditions other than malignant neoplasm: Secondary | ICD-10-CM

## 2012-12-17 DIAGNOSIS — Z5189 Encounter for other specified aftercare: Secondary | ICD-10-CM

## 2012-12-17 MED ORDER — CARVEDILOL 12.5 MG PO TABS: 12.5000 mg | ORAL_TABLET | Freq: Two times a day (BID) | ORAL | Status: DC

## 2012-12-17 MED ORDER — CARVEDILOL 6.25 MG PO TABS: ORAL_TABLET | ORAL | Status: DC

## 2012-12-17 MED ORDER — SPIRONOLACTONE 25 MG PO TABS: 25.0000 mg | ORAL_TABLET | Freq: Every day | ORAL | Status: DC

## 2012-12-17 NOTE — Patient Instructions (Addendum)
Take carvedilol twice daily  Increase spironolactone 25 mg (1 tab) daily.  Follow up 2 months.

## 2012-12-17 NOTE — Assessment & Plan Note (Addendum)
EF has fully recovered off of herceptin therapy.  Will continue carvedilol and spironolactone.  She is only taking carvedilol once daily, will increase to BID.  She is also taking spiro 25 mg most days as it is difficult to cut pills, I believe BP can handle increase to spiro 25 daily.  Will need close follow up if herceptin therapy is restarted.    Patient seen and examined with Ulyess Blossom, PA-C. We discussed all aspects of the encounter. I agree with the assessment and plan as stated above.  Echo reviewed personally in clinic and EF has completely normalized. Agree with meds as above.

## 2012-12-17 NOTE — Progress Notes (Signed)
HPI:  Jeanette Morris is a 67 y/o woman with h/o HTN and obesity. Denies any known h/o CAD or HF.  She was diagnosed with R Breast CA in 9/11. Underwent neoadjuvant chemo with (TCH regimen) followed by carboplatin and Taxotere x 6 cycles. She is status post rt mastectomy in April 2012 LN+. ER/PR/NER 2-neu +. Post-op had XRT. [name] would like to start her on Herceptin. However had echocardiogram on 01/06/11 which was read as EF 45-50% and so referred for cardiology clearance.  Her echos have been reviewed, dating back to 2003. EF has remained in 40-50% range without significant change. Lateral S' velocity also unchanged around 10.5 cm/sec - although some echos with poor windows.  With her EF running on the low end of the normal range for almost a decade and the importance of the Herceptin therapy, we cleared her for Herceptin therapy starting in September 2012 with concomitant therapy with ACE-I and b-blocker. However, there was concern that EF was drifting down and Herceptin was stopped in January and she has not been on since.  Echos: 05/03/11: 50-55%, lateral s' velocity around 11.5 cm/sec.   09/22/11: 50-55%, lateral s' 10.5-11 but poor window 12/21/11: 45-50% LVIDd 5.0 cm (up from 4.4) Lat s' 6.7cm/sec (poor windows) 02/02/12: 40-45%%   7.1 02/23/12 Cardiac MRI: EF 38%. No scarring 05/28/12 EF 45% lateral S' 10.1 07/23/12 EF50-55%  Lateral S' 11.2 10/22/12: EF ~50% lat s ' 10.1 12/17/12: EF 50-55% lat s' 12.8  She returns for follow up today.  She is tired because of having family over for Easter yesterday.  She says she feels well.  No further issue with her pleural effusion.  She says she feels well off herceptin but will find out on Thursday if she will need to restart therapy.  She denies orthopnea, PND or dyspnea.  No chest pain  . ROS: All other systems normal except as mentioned in HPI, past medical history and problem list.    Past Medical History  Diagnosis Date  . Hypertension   . Hearing  loss     right ear  . Hyperlipidemia   . Arthritis   . 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 Breast cancer     (Rt) breast ca dx 9/11  . Dysrhythmia     A fib with IV chemo treatments  . Shortness of breath     Current Outpatient Prescriptions  Medication Sig Dispense Refill  . aspirin 81 MG tablet Take 81 mg by mouth daily.      . Calcium Carbonate-Vitamin D 600-400 MG-UNIT per tablet Take 1 tablet by mouth every morning.       . carvedilol (COREG) 12.5 MG tablet Take 12.5 mg by mouth every morning. Take With 6.25 mg tablet to make a total strength of 18.75mg       . carvedilol (COREG) 6.25 MG tablet Take 1 tablet (6.25 mg total) by mouth every morning. Take With 12.5 mg tablet to make a total strength of 18.75mg   60 tablet  3  . cyanocobalamin 500 MCG tablet Take 500 mcg by mouth daily.      . ferrous sulfate 325 (65 FE) MG tablet Take 1 tablet (325 mg total) by mouth daily with breakfast.  30 tablet  1  . gabapentin (NEURONTIN) 300 MG capsule Take 300 mg by mouth at bedtime.       . lidocaine-prilocaine (EMLA) cream       . ondansetron (ZOFRAN) 8 MG  tablet Take by mouth every 8 (eight) hours as needed. After chemo tx's      . oxyCODONE-acetaminophen (PERCOCET/ROXICET) 5-325 MG per tablet Take 1 tablet by mouth every 8 (eight) hours as needed for pain.  90 tablet  0  . prochlorperazine (COMPAZINE) 10 MG tablet Take 10 mg by mouth every 6 (six) hours as needed. After chemo tx's      . spironolactone (ALDACTONE) 25 MG tablet Take 0.5 tablets (12.5 mg total) by mouth daily.  15 tablet  6   No current facility-administered medications for this encounter.     No Known Allergies   PHYSICAL EXAM: Filed Vitals:   12/17/12 1359  BP: 150/68  Pulse: 96  Weight: 239 lb 8 oz (108.636 kg)  SpO2: 97%   General:  Obese. Chronically ill appearing.  No respiratory difficulty  HEENT: normal Neck: supple. no JVD. Carotids 2+ bilat; no bruits. No lymphadenopathy or thryomegaly  appreciated. S/p R mastectomy Cor: PMI nondisplaced. Regular rate & rhythm. No rubs, gallops or murmurs. Lungs: clear bilaterally Abdomen: obese soft, nontender, nondistended. No hepatosplenomegaly. No bruits or masses. Good bowel sounds. Extremities: no cyanosis, clubbing, rash, edema.  Rt arm lymphedema Neuro: alert & oriented x 3, cranial nerves grossly intact. moves all 4 extremities w/o difficulty. Affect pleasant.    ASSESSMENT & PLAN:

## 2012-12-17 NOTE — Assessment & Plan Note (Addendum)
As above, increase carvedilol 18.75 mg BID and spironolactone 25 mg daily.  She will continue to follow BP closely  Attending: agree.

## 2012-12-17 NOTE — Progress Notes (Signed)
  Echocardiogram 2D Echocardiogram has been performed.  [place]ian Co 12/17/2012, 2:08 PM

## 2012-12-18 ENCOUNTER — Ambulatory Visit (HOSPITAL_COMMUNITY)
Admission: RE | Admit: 2012-12-18 | Discharge: 2012-12-18 | Disposition: A | Discharge status: Home / Self Care | Payer: PRIVATE HEALTH INSURANCE | Source: Ambulatory Visit | Attending: Oncology | Admitting: Oncology

## 2012-12-18 DIAGNOSIS — C50919 Malignant neoplasm of unspecified site of unspecified female breast: Secondary | ICD-10-CM

## 2012-12-18 MED ORDER — IOHEXOL 300 MG/ML  SOLN
100.0000 mL | Freq: Once | INTRAMUSCULAR | Status: AC | PRN
  Administered 2012-12-18: 100 mL via INTRAVENOUS

## 2012-12-19 ENCOUNTER — Other Ambulatory Visit: Payer: Self-pay | Admitting: Physician Assistant

## 2012-12-19 DIAGNOSIS — C50912 Malignant neoplasm of unspecified site of left female breast: Secondary | ICD-10-CM

## 2012-12-20 ENCOUNTER — Ambulatory Visit (HOSPITAL_BASED_OUTPATIENT_CLINIC_OR_DEPARTMENT_OTHER): Payer: PRIVATE HEALTH INSURANCE

## 2012-12-20 ENCOUNTER — Telehealth: Payer: Self-pay | Admitting: *Deleted

## 2012-12-20 ENCOUNTER — Telehealth: Payer: Self-pay | Admitting: Oncology

## 2012-12-20 ENCOUNTER — Other Ambulatory Visit (HOSPITAL_BASED_OUTPATIENT_CLINIC_OR_DEPARTMENT_OTHER): Payer: PRIVATE HEALTH INSURANCE | Admitting: Lab

## 2012-12-20 ENCOUNTER — Ambulatory Visit (HOSPITAL_BASED_OUTPATIENT_CLINIC_OR_DEPARTMENT_OTHER): Payer: PRIVATE HEALTH INSURANCE | Admitting: Oncology

## 2012-12-20 VITALS — BP 131/88 | HR 64 | Temp 97.2°F | Resp 20 | Ht 65.0 in | Wt 239.6 lb

## 2012-12-20 DIAGNOSIS — C773 Secondary and unspecified malignant neoplasm of axilla and upper limb lymph nodes: Secondary | ICD-10-CM

## 2012-12-20 DIAGNOSIS — C50619 Malignant neoplasm of axillary tail of unspecified female breast: Secondary | ICD-10-CM

## 2012-12-20 DIAGNOSIS — Z5111 Encounter for antineoplastic chemotherapy: Secondary | ICD-10-CM

## 2012-12-20 DIAGNOSIS — C50919 Malignant neoplasm of unspecified site of unspecified female breast: Secondary | ICD-10-CM

## 2012-12-20 DIAGNOSIS — Z170 Estrogen receptor positive status [ER+]: Secondary | ICD-10-CM

## 2012-12-20 DIAGNOSIS — Z5112 Encounter for antineoplastic immunotherapy: Secondary | ICD-10-CM

## 2012-12-20 DIAGNOSIS — C50912 Malignant neoplasm of unspecified site of left female breast: Secondary | ICD-10-CM

## 2012-12-20 LAB — CBC WITH DIFFERENTIAL/PLATELET
BASO%: 0.7 % (ref 0.0–2.0)
Basophils Absolute: 0 10*3/uL (ref 0.0–0.1)
EOS%: 0.4 % (ref 0.0–7.0)
HCT: 30.6 % — ABNORMAL LOW (ref 34.8–46.6)
HGB: 9.9 g/dL — ABNORMAL LOW (ref 11.6–15.9)
MCH: 29 pg (ref 25.1–34.0)
MONO#: 0.4 10*3/uL (ref 0.1–0.9)
RDW: 16.2 % — ABNORMAL HIGH (ref 11.2–14.5)
WBC: 2.7 10*3/uL — ABNORMAL LOW (ref 3.9–10.3)
lymph#: 0.8 10*3/uL — ABNORMAL LOW (ref 0.9–3.3)

## 2012-12-20 LAB — COMPREHENSIVE METABOLIC PANEL (CC13)
AST: 16 U/L (ref 5–34)
Albumin: 3.1 g/dL — ABNORMAL LOW (ref 3.5–5.0)
Alkaline Phosphatase: 76 U/L (ref 40–150)
Calcium: 9 mg/dL (ref 8.4–10.4)
Chloride: 105 mEq/L (ref 98–107)
Glucose: 109 mg/dl — ABNORMAL HIGH (ref 70–99)
Potassium: 4.6 mEq/L (ref 3.5–5.1)
Sodium: 138 mEq/L (ref 136–145)
Total Protein: 7.4 g/dL (ref 6.4–8.3)

## 2012-12-20 MED ORDER — OXYCODONE-ACETAMINOPHEN 5-325 MG PO TABS: 1.0000 | ORAL_TABLET | Freq: Three times a day (TID) | ORAL | Status: DC | PRN

## 2012-12-20 MED ORDER — FULVESTRANT 250 MG/5ML IM SOLN
500.0000 mg | INTRAMUSCULAR | Status: DC
  Administered 2012-12-20: 500 mg via INTRAMUSCULAR
  Filled 2012-12-20: qty 10

## 2012-12-20 MED ORDER — ACETAMINOPHEN 325 MG PO TABS
650.0000 mg | ORAL_TABLET | Freq: Once | ORAL | Status: AC
  Administered 2012-12-20: 650 mg via ORAL

## 2012-12-20 MED ORDER — SODIUM CHLORIDE 0.9 % IJ SOLN
10.0000 mL | INTRAMUSCULAR | Status: DC | PRN
  Administered 2012-12-20: 10 mL
  Filled 2012-12-20: qty 10

## 2012-12-20 MED ORDER — HEPARIN SOD (PORK) LOCK FLUSH 100 UNIT/ML IV SOLN
500.0000 [IU] | Freq: Once | INTRAVENOUS | Status: AC | PRN
  Administered 2012-12-20: 500 [IU]
  Filled 2012-12-20: qty 5

## 2012-12-20 MED ORDER — TRASTUZUMAB CHEMO INJECTION 440 MG
6.0000 mg/kg | Freq: Once | INTRAVENOUS | Status: AC
  Administered 2012-12-20: 651 mg via INTRAVENOUS
  Filled 2012-12-20: qty 31

## 2012-12-20 MED ORDER — DIPHENHYDRAMINE HCL 25 MG PO CAPS
25.0000 mg | ORAL_CAPSULE | Freq: Once | ORAL | Status: AC
  Administered 2012-12-20: 25 mg via ORAL

## 2012-12-20 MED ORDER — SODIUM CHLORIDE 0.9 % IV SOLN
Freq: Once | INTRAVENOUS | Status: AC
  Administered 2012-12-20: 13:00:00 via INTRAVENOUS

## 2012-12-20 NOTE — Progress Notes (Signed)
ID: Jeanette Morris   DOB: [birth year]  MR#: [id number]  CSN#:[id number]  PCP: Egbert Garibaldi, NP GYN:  SUFelicity Pellegrini OTHER MD: Arvilla Meres, Antony Blackbird, Rexanne Mano   HISTORY OF PRESENT ILLNESS: The patient undergoes annual screening mammography.  Her mammograms previously had fullness.  In August 2011 she presented with some firmness of her right breast. She also noted some skin thickening around her nipple and some tenderness.  She was subsequently seen for this on 05/13/2010 and was referred for bilateral mammogram and right breast ultrasound.  There was concern that this may be mastitis.  There were diffuse skin thickening, tissue edema, enlarged lymph node in right axilla, some subareolar distortion secondary to the previous surgery.  She was given a course of doxycycline for 10 days and asked to return. A follow-up ultrasound of the breast showed persistent abnormalities.  She was referred for biopsy.  She underwent biopsy of the right axilla on 05/27/2010 and it showed metastatic carcinoma consistent with breast primary.  This was ER and PR positive, HER-2 was positive with a ratio of 3.09.  The ER ratio is 34%, PR was 64%, proliferative index 60%.  She subsequently had a MRI scan of both breasts on 06/03/2010 and this showed a area of minimal enhancement in the right breast measuring 10.0 x 6.0 x 3.0 cm.  Enlarged lymph nodes were seen in the right axilla.  Largest node measuring 3.0 x 1.0 x 7.0 cm.  Intramammary lymph nodes were also seen in the outer left breast, the largest measuring 2.8 x 2.5 cm.  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 The patient's subsequent history is as detailed below.  INTERVAL HISTORY: Jeanette Morris returns today  for followup of her breast cancer. She is due for day 1 cycle 5 of Abraxane tomorrow, 11/08/2012, with the Abraxane given on days 1 and 8 of each 21 day cycle. She continues to tolerate the  Abraxane extremely well with "no side effects at all".   Interval history is remarkable for a  recent echocardiogram and followup with [name], and he has cleared her to restart trastuzumab. The plan is to restart the trastuzumab with day 1 of Abraxane today.  Jeanette Morris continues to deny any increased shortness of breath. She's had no cough and denies any orthopnea or PND.   REVIEW OF SYSTEMS: Jeanette Morris denies any fevers or chills. She's had no skin changes. She denies abnormal bleeding. Her appetite is slightly reduced but she is keeping herself well hydrated and denies any nausea or emesis. She's had no diarrhea, constipation, or change in urinary habits. She also denies any chest pain or palpitations. No abnormal headaches or dizziness. She continues to have lymphedema affecting the right upper extremity and is scheduled to be seen at the lymphedema clinic tomorrow.   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  A detailed review of systems is otherwise stable and noncontributory.  PAST MEDICAL HISTORY: Past Medical History  Diagnosis Date  . Hypertension   . Hearing loss     right ear  . Hyperlipidemia   . Arthritis   . 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 Breast cancer     (Rt) breast ca dx 9/11  . Dysrhythmia     A fib with IV chemo treatments  .  Shortness of breath     PAST SURGICAL HISTORY: Past Surgical History  Procedure Laterality Date  . Tubal ligation    . Portacath placement    . Abdominal hysterectomy    . Lung biopsy    . Lesion excision      L anterior thigh  . Breast surgery  12/01/10    ER+,PR+,Ki 67 64%,Her2 -; right breast lumpectomy  . Chest tube insertion  08/08/2012    Procedure: INSERTION PLEURAL DRAINAGE CATHETER;  Surgeon: Alleen Borne, MD;  Location: [facility] OR;  Service: Thoracic;  Laterality: Right;  . Removal of pleural drainage  catheter Right 10/10/2012    Procedure: REMOVAL OF PLEURAL DRAINAGE CATHETER;  Surgeon: Alleen Borne, MD;  Location: [facility] OR;  Service: Thoracic;  Laterality: Right;    FAMILY HISTORY No family history on file. The patient's father died in an automobile accident at age 3. The patient's mother died at age 69 with Alzheimer's disease. The patient had 8 brothers and 4 sisters. There is no history of breast or ovarian cancer in the family.  GYNECOLOGIC HISTORY: Menarche age 46, first live birth age 70. She is GX P7. She underwent menopause "more than 20 years ago". She never took hormone replacement.  SOCIAL HISTORY: She has been mostly a housewife. Her husband Jeanette Morris used to be a cook at [employer]. He completed radiation therapy for prostate cancer under [facility] April of 2014. The patient's children are from an earlier marriage, and include Jeanette Morris, Jeanette Morris, [address], [address], Jeanette Morris, Jeanette Morris, and [employer]. The patient has 15 grandchildren and 1 great-grandchild. She at tends [place]. [employer]  ADVANCED DIRECTIVES: Not in place  HEALTH MAINTENANCE: History  Substance Use Topics  . Smoking status: Former Smoker    Quit date: 06/30/1973  . Smokeless tobacco: Never Used  . Alcohol Use: No     Colonoscopy:  PAP:  Bone density:  Lipid panel:  No Known Allergies  Current Outpatient Prescriptions  Medication Sig Dispense Refill  . aspirin 81 MG tablet Take 81 mg by mouth daily.      . Calcium Carbonate-Vitamin D 600-400 MG-UNIT per tablet Take 1 tablet by mouth every morning.       . carvedilol (COREG) 12.5 MG tablet Take 1 tablet (12.5 mg total) by mouth 2 (two) times daily with a meal. Take With 6.25 mg tablet to make a total strength of 18.75mg       . carvedilol (COREG) 6.25 MG tablet Take With 12.5 mg tablet to make a total strength of 18.75mg   60 tablet  3  . cyanocobalamin 500 MCG tablet Take 500 mcg by mouth daily.      .  ferrous sulfate 325 (65 FE) MG tablet Take 1 tablet (325 mg total) by mouth daily with breakfast.  30 tablet  1  . gabapentin (NEURONTIN) 300 MG capsule Take 300 mg by mouth at bedtime.       . lidocaine-prilocaine (EMLA) cream       . ondansetron (ZOFRAN) 8 MG tablet Take by mouth every 8 (eight) hours as needed. After chemo tx's      . oxyCODONE-acetaminophen (PERCOCET/ROXICET) 5-325 MG per tablet Take 1 tablet by mouth every 8 (eight) hours as needed for pain.  90 tablet  0  . prochlorperazine (COMPAZINE) 10 MG tablet Take 10 mg by mouth every 6 (six) hours as needed. After chemo tx's      . spironolactone (ALDACTONE) 25 MG tablet  Take 1 tablet (25 mg total) by mouth daily.  30 tablet  6   No current facility-administered medications for this visit.    OBJECTIVE: Elderly African American woman in no acute distress Filed Vitals:   12/20/12 1132  BP: 131/88  Pulse: 64  Temp: 97.2 F (36.2 C)  Resp: 20   Filed Weights   12/20/12 1132  Weight: 239 lb 9.6 oz (108.682 kg)      Body mass index is 39.87 kg/(m^2).    ECOG FS: 2  Sclerae unicteric Oropharynx clear No cervical or supraclavicular adenopathy  Lungs no rales or rhonchi; breath sounds are mildly diminished bilaterally in the bases. Heart regular rate and rhythm, no murmur appreciated Abdomen  obese, soft, nontender, positive bowel sounds MSK no focal spinal tenderness; grade 1-2 right upper extremity lymphedema; no additional peripheral edema, and specifically no edema in the lower extremities. Neuro: nonfocal, well oriented, positive affect Breasts: Deferred. Axillae are benign bilaterally with no palpable adenopathy.  LAB RESULTS: Lab Results  Component Value Date   WBC 2.7* 12/20/2012   NEUTROABS 1.4* 12/20/2012   HGB 9.9* 12/20/2012   HCT 30.6* 12/20/2012   MCV 89.7 12/20/2012   PLT 219 12/20/2012      Chemistry      Component Value Date/Time   NA 140 11/29/2012 1101   NA 139 08/08/2012 0658   K 4.1 11/29/2012 1101    K 3.6 08/08/2012 0658   CL 108* 11/29/2012 1101   CL 103 08/08/2012 0658   CO2 26 11/29/2012 1101   CO2 28 08/08/2012 0658   BUN 16.3 11/29/2012 1101   BUN 15 08/08/2012 0658   CREATININE 1.0 11/29/2012 1101   CREATININE 1.56* 08/08/2012 0658      Component Value Date/Time   CALCIUM 8.6 11/29/2012 1101   CALCIUM 9.7 08/08/2012 0658   ALKPHOS 75 11/29/2012 1101   ALKPHOS 91 08/08/2012 0658   AST 15 11/29/2012 1101   AST 86* 08/08/2012 0658   ALT 10 11/29/2012 1101   ALT 117* 08/08/2012 0658   BILITOT 0.35 11/29/2012 1101   BILITOT 0.3 08/08/2012 0658       Lab Results  Component Value Date   LABCA2 95* 08/14/2012    STUDIES: Ct Chest W Contrast  12/18/2012  *RADIOLOGY REPORT*  Clinical Data: History of right-sided breast cancer status post right-sided mastectomy.  Chemotherapy ongoing.  Radiation therapy complete.  Right arm lymph edema.  CT CHEST WITH CONTRAST  Technique:  Multidetector CT imaging of the chest was performed following the standard protocol during bolus administration of intravenous contrast.  Contrast: OMNIPAQUE IOHEXOL 300 MG/ML  SOLN  Comparison: Chest CT 07/30/2012.  Findings:  Mediastinum: Heart size is mildly enlarged. There is no significant pericardial fluid, thickening or pericardial calcification. There is atherosclerosis of the thoracic aorta, the great vessels of the mediastinum and the coronary arteries, including calcified atherosclerotic plaque in the left main, left anterior descending, left circumflex and right coronary arteries. Markedly enlarged and heterogeneous appearing thyroid gland with apparent substernal extension, similar to prior examinations, favored to represent a severe multinodular goiter. The lung of the esophagus is unremarkable in appearance. No pathologically enlarged mediastinal, hilar or bilateral internal mammary lymph nodes.  Lungs/Pleura: No suspicious appearing pulmonary nodules or masses are identified at this time.  Thin-walled cyst in  the anterolateral aspect of the left upper lobe incidentally noted.  No acute consolidative airspace disease. Previously noted large right-sided pleural effusion has resolved, as has the previously noted  small left pleural effusion.  There is a small amount of pleural thickening in the posterior aspect of the right hemithorax best demonstrated on image 38 of series 2.  Upper Abdomen: Diffuse calcifications of the wall of the gallbladder redemonstrated.  Musculoskeletal: Status post right-sided modified radical mastectomy and right axillary nodal dissection.  No definite soft tissue mass identified along the right chest wall or within the right axillary region to suggest local recurrence of disease or new nodal disease.  As with the prior examination there is soft tissue stranding and numerous borderline enlarged lymph nodes throughout the left axilla, which is nonspecific, but appears slightly decreased compared to prior examination. There are no aggressive appearing lytic or blastic lesions noted in the visualized portions of the skeleton.  IMPRESSION: 1.  Status post right-sided modified radical mastectomy and axillary nodal dissection, without evidence to suggest local recurrence of disease or new metastatic disease to the thorax. Previously noted right-sided pleural effusion has essentially resolved, although there is a small amount of residual pleural thickening in the posterior aspect of the right hemithorax, likely indicative of residual metastatic disease. 2. Soft tissue stranding and enlarged lymph nodes in the left axilla appears slightly decreased compared to the prior study. 3. Atherosclerosis, including left main and three-vessel coronary artery disease.   Assessment for potential risk factor modification, dietary therapy or pharmacologic therapy may be warranted, if clinically indicated. 4.  Mild cardiomegaly. 5.  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   Original Report Authenticated By: Trudie Reed, M.D.     ASSESSMENT: 67 y.o. [place] woman with stage 4 breast cancer  (1) status post RIGHT axillary lymph node biopsy 05/27/2010 for a clinical T4 N1, stage IIIB (inflammatory) invasive ductal carcinoma, grade not stated, estrogen receptor 94% positive, progesterone receptor 64% positive, with an MIB-1 of 60%, and HER-2 amplification by FISH with a ratio of 7.5.  (2) neoadjuvant chemotherapy consisted of 6 cycles of standard carboplatin, docetaxel, trastuzumab, with the trastuzumab held cycles 5 and 6 due to a drop in her borderline ejection fraction (from 45-50% at baseline to 40-45% January 2012)-- total trastuzumab treatment = 2 months  (3) status post right mastectomy and axillary lymph node dissection 12/01/2010, showing no residual invasive carcinoma in the breast, but residual tumor in 20 of 21 axillary lymph nodes. (Only one of the 20 positive lymph nodes showed a significant residual tumor, measuring 5 mm; the remaining lymph nodes, including the evidence of extracapsular extension, showed single and clusters a few neoplastic cells with extensive therapy related changes).  (4) postmastectomy radiation to the right chest wall, axilla, and supraclavicular region completed 03/07/2011  (5) additional 2 months of trastuzumab given February through April 2013, again discontinued because of a drop in the ejection fraction and lateral S'  (6) on letrozole July 2012 to September 2013  (7) LEFT axillary adenopathy noted by Dr. Carolynne Edouard on exam July 2013, with biopsy of a left axillary node 04/17/2012 showing an invasive ductal carcinoma, estrogen receptor 91% positive, progesterone receptor 9% positive, with an MIB-1 of 45%, and no HER-2 amplification. This tumor is morphologically identical to her prior Right breast cancer.  (8) PET scan 04/03/2012 showed multiple enlarged left axillary and small left supraclavicular lymph nodes  that are moderately hypermetabolic, with some hypermetabolic activity within the left axillary tail. No other hypermetabolic lymph nodes identified. There was no involvement of the right axilla, mediastinum or right chest wall and no extrathoracic metastases were  demonstrated.  (8) on exemestane and everolimus September through December 2013  (9) s/p RIGHT thoracentesis 08/01/2012, cytologically positive; s/p temporary Pleurx; effusion resolved after Abraxane therapy  (10) Abraxane started 08/16/2012, given day 1 and day 8 of each 21 day cycle, completed 6 cycles (12 doses) 12/06/2012, with excellent response  (11) fulvestrant started 12/19/2012  (12) trastuzumab resumed 12/21/2011. We will schedule the doses 4 weeks apart to see if they can be better tolerated. Most recent echocardiogram 12/17/2012 showed an ejection fraction in the 65-70% range.  PLAN:  Evetta has done very well with her Abraxane and the plan now is to switch her to anti-estrogens. We started fulvestrant today and she has a good understanding of the possible toxicities, side effects and complications of that medication. We are going to try the trastuzumab one more time on a different schedule, but if she has yet another drop ejection fraction or problems with her S' we will switch to lapatinib. She will see [place] again in May to make sure she is tolerating treatment well and to set her up for her of additional doses. She will then see [place] again in August, with a repeat chest x-ray. She knows to call for any problems that may develop before the next visit          Britni Driscoll C    12/20/2012

## 2012-12-20 NOTE — Patient Instructions (Addendum)
[facility] Discharge Instructions for Patients Receiving Chemotherapy  Today you received the following chemotherapy agents Herceptin and Faslodex.  To help prevent nausea and vomiting after your treatment, we encourage you to take your nausea medication. If you develop nausea and vomiting that is not controlled by your nausea medication, call the clinic. If it is after clinic hours your family physician or the after hours number for the clinic or go to the Emergency Department.   BELOW ARE SYMPTOMS THAT SHOULD BE REPORTED IMMEDIATELY:  *FEVER GREATER THAN 100.5 F  *CHILLS WITH OR WITHOUT FEVER  NAUSEA AND VOMITING THAT IS NOT CONTROLLED WITH YOUR NAUSEA MEDICATION  *UNUSUAL SHORTNESS OF BREATH  *UNUSUAL BRUISING OR BLEEDING  TENDERNESS IN MOUTH AND THROAT WITH OR WITHOUT PRESENCE OF ULCERS  *URINARY PROBLEMS  *BOWEL PROBLEMS  UNUSUAL RASH Items with * indicate a potential emergency and should be followed up as soon as possible.  One of the nurses will contact you 24 hours after your treatment. Please let the nurse know about any problems that you may have experienced. Feel free to call the clinic you have any questions or concerns. The clinic phone number is [phone].   I have been informed and understand all the instructions given to me. I know to contact the clinic, my physician, or go to the Emergency Department if any problems should occur. I do not have any questions at this time, but understand that I may call the clinic during office hours   should I have any questions or need assistance in obtaining follow up care.    __________________________________________  _____________  __________ Signature of Patient or Authorized Representative            Date                   Time    __________________________________________ Nurse's Signature

## 2012-12-20 NOTE — Telephone Encounter (Signed)
Per staff message and POF I have scheduled appts.  JMW  

## 2012-12-25 ENCOUNTER — Telehealth: Payer: Self-pay | Admitting: Oncology

## 2013-01-02 ENCOUNTER — Ambulatory Visit (INDEPENDENT_AMBULATORY_CARE_PROVIDER_SITE_OTHER): Payer: PRIVATE HEALTH INSURANCE | Admitting: General Surgery

## 2013-01-02 ENCOUNTER — Encounter (INDEPENDENT_AMBULATORY_CARE_PROVIDER_SITE_OTHER): Payer: Self-pay | Admitting: General Surgery

## 2013-01-02 VITALS — BP 142/80 | HR 76 | Temp 98.8°F | Resp 16 | Ht 65.0 in | Wt 239.0 lb

## 2013-01-02 DIAGNOSIS — C50919 Malignant neoplasm of unspecified site of unspecified female breast: Secondary | ICD-10-CM

## 2013-01-02 NOTE — Progress Notes (Signed)
Subjective:     Patient ID: Jeanette Morris, female   DOB: May 04, 1946, 67 y.o.   MRN: [id number]  HPI The patient is a 67 year old white female who is 2-1/2 years status post right mastectomy and axillary lymph node dissection for a TxN3 right breast cancer. She has since developed adenopathy in her left axillary and supraclavicular area that is positive for breast cancer as well as a malignant right pleural effusion. She has been restarted on chemotherapy and seems to be tolerating it well. She has no complaints today.  Review of Systems  Constitutional: Negative.   HENT: Negative.   Eyes: Negative.   Respiratory: Negative.   Cardiovascular: Negative.   Gastrointestinal: Negative.   Endocrine: Negative.   Genitourinary: Negative.   Musculoskeletal: Positive for joint swelling.  Skin: Negative.   Allergic/Immunologic: Negative.   Neurological: Negative.   Hematological: Positive for adenopathy.  Psychiatric/Behavioral: Negative.        Objective:   Physical Exam  Constitutional: She is oriented to person, place, and time. She appears well-developed and well-nourished.  HENT:  Head: Normocephalic and atraumatic.  Eyes: Conjunctivae and EOM are normal. Pupils are equal, round, and reactive to light.  Neck: Normal range of motion. Neck supple.  There is some palpable left supraclavicular lymphadenopathy  Cardiovascular: Normal rate, regular rhythm and normal heart sounds.   Pulmonary/Chest: Effort normal and breath sounds normal.  There is no palpable mass of the right chest wall. There is a large palpable mass in the area of the tail of the breast and left axilla. This feels very similar to her previous exam.  Abdominal: Soft. Bowel sounds are normal. She exhibits no mass. There is no tenderness.  Musculoskeletal: Normal range of motion.  There is some stable swelling of the right arm.  Neurological: She is alert and oriented to person, place, and time.  Skin: Skin is warm and dry.   Psychiatric: She has a normal mood and affect. Her behavior is normal.       Assessment:     The patient is to half years status post right modified radical mastectomy for a locally advanced right breast cancer. She now has evidence of metastatic disease.     Plan:     At this point she will continue chemotherapy as per her oncologist. We will plan to see her back in about 6 months.

## 2013-01-03 ENCOUNTER — Other Ambulatory Visit: Payer: Self-pay | Admitting: Oncology

## 2013-01-03 ENCOUNTER — Ambulatory Visit (HOSPITAL_BASED_OUTPATIENT_CLINIC_OR_DEPARTMENT_OTHER): Payer: PRIVATE HEALTH INSURANCE

## 2013-01-03 VITALS — BP 136/72 | HR 87 | Temp 98.2°F

## 2013-01-03 DIAGNOSIS — C50619 Malignant neoplasm of axillary tail of unspecified female breast: Secondary | ICD-10-CM

## 2013-01-03 DIAGNOSIS — C773 Secondary and unspecified malignant neoplasm of axilla and upper limb lymph nodes: Secondary | ICD-10-CM

## 2013-01-03 DIAGNOSIS — Z5111 Encounter for antineoplastic chemotherapy: Secondary | ICD-10-CM

## 2013-01-03 DIAGNOSIS — C50919 Malignant neoplasm of unspecified site of unspecified female breast: Secondary | ICD-10-CM

## 2013-01-03 MED ORDER — FULVESTRANT 250 MG/5ML IM SOLN
500.0000 mg | INTRAMUSCULAR | Status: DC
  Administered 2013-01-03: 500 mg via INTRAMUSCULAR
  Filled 2013-01-03: qty 10

## 2013-01-16 ENCOUNTER — Other Ambulatory Visit: Payer: Self-pay | Admitting: Physician Assistant

## 2013-01-16 DIAGNOSIS — C50912 Malignant neoplasm of unspecified site of left female breast: Secondary | ICD-10-CM

## 2013-01-17 ENCOUNTER — Ambulatory Visit (HOSPITAL_BASED_OUTPATIENT_CLINIC_OR_DEPARTMENT_OTHER): Payer: PRIVATE HEALTH INSURANCE | Admitting: Physician Assistant

## 2013-01-17 ENCOUNTER — Ambulatory Visit (HOSPITAL_BASED_OUTPATIENT_CLINIC_OR_DEPARTMENT_OTHER): Payer: PRIVATE HEALTH INSURANCE

## 2013-01-17 ENCOUNTER — Other Ambulatory Visit (HOSPITAL_BASED_OUTPATIENT_CLINIC_OR_DEPARTMENT_OTHER): Payer: PRIVATE HEALTH INSURANCE | Admitting: Lab

## 2013-01-17 ENCOUNTER — Telehealth: Payer: Self-pay | Admitting: *Deleted

## 2013-01-17 ENCOUNTER — Encounter: Payer: Self-pay | Admitting: Physician Assistant

## 2013-01-17 VITALS — BP 142/75 | HR 71 | Temp 98.4°F | Resp 20 | Ht 65.0 in | Wt 235.4 lb

## 2013-01-17 DIAGNOSIS — C50619 Malignant neoplasm of axillary tail of unspecified female breast: Secondary | ICD-10-CM

## 2013-01-17 DIAGNOSIS — C50911 Malignant neoplasm of unspecified site of right female breast: Secondary | ICD-10-CM

## 2013-01-17 DIAGNOSIS — I972 Postmastectomy lymphedema syndrome: Secondary | ICD-10-CM

## 2013-01-17 DIAGNOSIS — Z9010 Acquired absence of unspecified breast and nipple: Secondary | ICD-10-CM

## 2013-01-17 DIAGNOSIS — C773 Secondary and unspecified malignant neoplasm of axilla and upper limb lymph nodes: Secondary | ICD-10-CM

## 2013-01-17 DIAGNOSIS — C50919 Malignant neoplasm of unspecified site of unspecified female breast: Secondary | ICD-10-CM

## 2013-01-17 DIAGNOSIS — C7800 Secondary malignant neoplasm of unspecified lung: Secondary | ICD-10-CM

## 2013-01-17 DIAGNOSIS — C50912 Malignant neoplasm of unspecified site of left female breast: Secondary | ICD-10-CM

## 2013-01-17 DIAGNOSIS — Z5112 Encounter for antineoplastic immunotherapy: Secondary | ICD-10-CM

## 2013-01-17 LAB — CBC WITH DIFFERENTIAL/PLATELET
BASO%: 0.3 % (ref 0.0–2.0)
EOS%: 3.3 % (ref 0.0–7.0)
MCH: 29.4 pg (ref 25.1–34.0)
MCV: 91.1 fL (ref 79.5–101.0)
MONO%: 11 % (ref 0.0–14.0)
RBC: 3.27 10*6/uL — ABNORMAL LOW (ref 3.70–5.45)
RDW: 15.3 % — ABNORMAL HIGH (ref 11.2–14.5)
lymph#: 1 10*3/uL (ref 0.9–3.3)
nRBC: 0 % (ref 0–0)

## 2013-01-17 LAB — COMPREHENSIVE METABOLIC PANEL (CC13)
ALT: 13 U/L (ref 0–55)
AST: 17 U/L (ref 5–34)
Alkaline Phosphatase: 77 U/L (ref 40–150)
Sodium: 139 mEq/L (ref 136–145)
Total Bilirubin: 0.42 mg/dL (ref 0.20–1.20)
Total Protein: 7.4 g/dL (ref 6.4–8.3)

## 2013-01-17 MED ORDER — TRASTUZUMAB CHEMO INJECTION 440 MG
6.0000 mg/kg | Freq: Once | INTRAVENOUS | Status: AC
  Administered 2013-01-17: 651 mg via INTRAVENOUS
  Filled 2013-01-17: qty 31

## 2013-01-17 MED ORDER — SODIUM CHLORIDE 0.9 % IJ SOLN
10.0000 mL | INTRAMUSCULAR | Status: DC | PRN
Start: 2013-01-17 — End: 2013-01-17
  Administered 2013-01-17: 10 mL
  Filled 2013-01-17: qty 10

## 2013-01-17 MED ORDER — HEPARIN SOD (PORK) LOCK FLUSH 100 UNIT/ML IV SOLN
500.0000 [IU] | Freq: Once | INTRAVENOUS | Status: AC | PRN
  Administered 2013-01-17: 500 [IU]
  Filled 2013-01-17: qty 5

## 2013-01-17 MED ORDER — FULVESTRANT 250 MG/5ML IM SOLN
500.0000 mg | INTRAMUSCULAR | Status: DC
  Administered 2013-01-17: 500 mg via INTRAMUSCULAR
  Filled 2013-01-17: qty 10

## 2013-01-17 MED ORDER — DIPHENHYDRAMINE HCL 25 MG PO CAPS
25.0000 mg | ORAL_CAPSULE | Freq: Once | ORAL | Status: AC
  Administered 2013-01-17: 25 mg via ORAL

## 2013-01-17 MED ORDER — SODIUM CHLORIDE 0.9 % IV SOLN
Freq: Once | INTRAVENOUS | Status: AC
  Administered 2013-01-17: 14:00:00 via INTRAVENOUS

## 2013-01-17 MED ORDER — ACETAMINOPHEN 325 MG PO TABS
650.0000 mg | ORAL_TABLET | Freq: Once | ORAL | Status: AC
  Administered 2013-01-17: 650 mg via ORAL

## 2013-01-17 NOTE — Telephone Encounter (Signed)
A 2nd pof was entered after pt left. Found pt in the lobby and informed her that they would like for her to have a cxr before her ov on 03/28/13...td

## 2013-01-17 NOTE — Telephone Encounter (Signed)
appts made and printed. Pt is aware that her tx will be added for 8/14. I emailed MW to add the tx. She is also aware that she is on [name] call back list for early July...td

## 2013-01-17 NOTE — Progress Notes (Signed)
ID: Jeanette Morris   DOB: 25-Oct-1945  MR#: [id number]  CSN#:[id number]  PCP: Egbert Garibaldi, NP GYN:  SUFelicity Pellegrini OTHER MD: Arvilla Meres, Antony Blackbird, Rexanne Mano   HISTORY OF PRESENT ILLNESS: The patient undergoes annual screening mammography.  Her mammograms previously had fullness.  In August 2011 she presented with some firmness of her right breast. She also noted some skin thickening around her nipple and some tenderness.  She was subsequently seen for this on 05/13/2010 and was referred for bilateral mammogram and right breast ultrasound.  There was concern that this may be mastitis.  There were diffuse skin thickening, tissue edema, enlarged lymph node in right axilla, some subareolar distortion secondary to the previous surgery.  She was given a course of doxycycline for 10 days and asked to return. A follow-up ultrasound of the breast showed persistent abnormalities.  She was referred for biopsy.  She underwent biopsy of the right axilla on 05/27/2010 and it showed metastatic carcinoma consistent with breast primary.  This was ER and PR positive, HER-2 was positive with a ratio of 3.09.  The ER ratio is 34%, PR was 64%, proliferative index 60%.  She subsequently had a MRI scan of both breasts on 06/03/2010 and this showed a area of minimal enhancement in the right breast measuring 10.0 x 6.0 x 3.0 cm.  Enlarged lymph nodes were seen in the right axilla.  Largest node measuring 3.0 x 1.0 x 7.0 cm.  Intramammary lymph nodes were also seen in the outer left breast, the largest measuring 2.8 x 2.5 cm.  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 The patient's subsequent history is as detailed below.  INTERVAL HISTORY: Myosha returns today  for followup of her metastatic breast cancer. She has completed her treatment with Abraxane, and is currently receiving monthly trastuzumab along with monthly fulvestrant injections. She is  due for both agents today. Emelly denies any current shortness of breath and has had no cough, phlegm production, orthopnea, pedal edema, chest pain, or palpitations.  Dahna is tolerating this regimen very well and "feels fine". Her only complaint is some continued lymphedema in the right upper extremity which is chronic. She does have a compression sleeve at home.  REVIEW OF SYSTEMS: Rayanne denies any fevers or chills. She's had no skin changes. She denies abnormal bleeding. Her appetite is good and she denies any nausea or emesis. She's had no diarrhea, constipation, or change in urinary habits.  No abnormal headaches or dizziness.   A detailed review of systems is otherwise stable and noncontributory.  PAST MEDICAL HISTORY: Past Medical History  Diagnosis Date  . Hypertension   . Hearing loss     right ear  . Hyperlipidemia   . Arthritis   . 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 Breast cancer     (Rt) breast ca dx 9/11  . Dysrhythmia     A fib with IV chemo treatments  . Shortness of breath     PAST SURGICAL HISTORY: Past Surgical History  Procedure Laterality Date  . Tubal ligation    . Portacath placement    . Abdominal hysterectomy    . Lung biopsy    . Lesion excision      L anterior thigh  . Breast surgery  12/01/10    ER+,PR+,Ki 67 64%,Her2 -; right breast lumpectomy  . Chest tube insertion  08/08/2012  Procedure: INSERTION PLEURAL DRAINAGE CATHETER;  Surgeon: Alleen Borne, MD;  Location: [facility] OR;  Service: Thoracic;  Laterality: Right;  . Removal of pleural drainage catheter Right 10/10/2012    Procedure: REMOVAL OF PLEURAL DRAINAGE CATHETER;  Surgeon: Alleen Borne, MD;  Location: [facility] OR;  Service: Thoracic;  Laterality: Right;    FAMILY HISTORY History reviewed. No pertinent family history. The patient's father died in an automobile accident at age 17. The patient's mother died at age 18 with Alzheimer's disease. The patient had 8 brothers and 4  sisters. There is no history of breast or ovarian cancer in the family.  GYNECOLOGIC HISTORY: Menarche age 70, first live birth age 63. She is GX P7. She underwent menopause "more than 20 years ago". She never took hormone replacement.  SOCIAL HISTORY: She has been mostly a housewife. Her husband [name] used to be a cook at [employer]. He completed radiation therapy for prostate cancer under [facility] April of 2014. The patient's children are from an earlier marriage, and include Kerri Perches, [place], [address], [address], Danielle [place], [place], and [employer]. The patient has 15 grandchildren and 1 great-grandchild. She at tends [place]. [employer]  ADVANCED DIRECTIVES: Not in place  HEALTH MAINTENANCE: History  Substance Use Topics  . Smoking status: Former Smoker    Quit date: 06/30/1973  . Smokeless tobacco: Never Used  . Alcohol Use: No     Colonoscopy:  PAP:  Bone density:  Lipid panel:  No Known Allergies  Current Outpatient Prescriptions  Medication Sig Dispense Refill  . aspirin 81 MG tablet Take 81 mg by mouth daily.      . Calcium Carbonate-Vitamin D 600-400 MG-UNIT per tablet Take 1 tablet by mouth every morning.       . carvedilol (COREG) 12.5 MG tablet Take 1 tablet (12.5 mg total) by mouth 2 (two) times daily with a meal. Take With 6.25 mg tablet to make a total strength of 18.75mg       . carvedilol (COREG) 6.25 MG tablet Take With 12.5 mg tablet to make a total strength of 18.75mg   60 tablet  3  . cyanocobalamin 500 MCG tablet Take 500 mcg by mouth daily.      . ferrous sulfate 325 (65 FE) MG tablet Take 1 tablet (325 mg total) by mouth daily with breakfast.  30 tablet  1  . gabapentin (NEURONTIN) 300 MG capsule Take 300 mg by mouth at bedtime.       . lidocaine-prilocaine (EMLA) cream       . oxyCODONE-acetaminophen (PERCOCET/ROXICET) 5-325 MG per tablet       . spironolactone (ALDACTONE) 25 MG tablet Take 1 tablet (25 mg  total) by mouth daily.  30 tablet  6   No current facility-administered medications for this visit.    OBJECTIVE: Elderly African American woman in no acute distress Filed Vitals:   01/17/13 1304  BP: 142/75  Pulse: 71  Temp: 98.4 F (36.9 C)  Resp: 20   Filed Weights   01/17/13 1304  Weight: 235 lb 6.4 oz (106.777 kg)      Body mass index is 39.17 kg/(m^2).    ECOG FS: 1  Sclerae unicteric Oropharynx clear No cervical or supraclavicular adenopathy  Lungs no rales or rhonchi; breath sounds are mildly diminished bilaterally in the bases.No wheezes. Heart regular rate and rhythm, no murmur appreciated Abdomen  obese, soft, nontender, positive bowel sounds MSK no focal spinal tenderness; right upper extremity  lymphedema, stable; no additional peripheral edema, and specifically no edema in the lower extremities. Neuro: nonfocal, well oriented, positive affect Breasts: The patient is status post right mastectomy with no skin changes, nodularity, or evidence of local recurrence. Axillae are benign bilaterally with no palpable adenopathy. Port is intact the left upper chest wall with no erythema, edema, or evidence of infection.  LAB RESULTS: Lab Results  Component Value Date   WBC 3.9 01/17/2013   NEUTROABS 2.4 01/17/2013   HGB 9.6* 01/17/2013   HCT 29.8* 01/17/2013   MCV 91.1 01/17/2013   PLT 197 01/17/2013      Chemistry      Component Value Date/Time   NA 138 12/20/2012 1055   NA 139 08/08/2012 0658   K 4.6 12/20/2012 1055   K 3.6 08/08/2012 0658   CL 105 12/20/2012 1055   CL 103 08/08/2012 0658   CO2 27 12/20/2012 1055   CO2 28 08/08/2012 0658   BUN 20.7 12/20/2012 1055   BUN 15 08/08/2012 0658   CREATININE 1.1 12/20/2012 1055   CREATININE 1.56* 08/08/2012 0658      Component Value Date/Time   CALCIUM 9.0 12/20/2012 1055   CALCIUM 9.7 08/08/2012 0658   ALKPHOS 76 12/20/2012 1055   ALKPHOS 91 08/08/2012 0658   AST 16 12/20/2012 1055   AST 86* 08/08/2012 0658   ALT 12  12/20/2012 1055   ALT 117* 08/08/2012 0658   BILITOT 0.31 12/20/2012 1055   BILITOT 0.3 08/08/2012 0658       Lab Results  Component Value Date   LABCA2 95* 08/14/2012    STUDIES: Ct Chest W Contrast  12/18/2012  *RADIOLOGY REPORT*  Clinical Data: History of right-sided breast cancer status post right-sided mastectomy.  Chemotherapy ongoing.  Radiation therapy complete.  Right arm lymph edema.  CT CHEST WITH CONTRAST  Technique:  Multidetector CT imaging of the chest was performed following the standard protocol during bolus administration of intravenous contrast.  Contrast: OMNIPAQUE IOHEXOL 300 MG/ML  SOLN  Comparison: Chest CT 07/30/2012.  Findings:  Mediastinum: Heart size is mildly enlarged. There is no significant pericardial fluid, thickening or pericardial calcification. There is atherosclerosis of the thoracic aorta, the great vessels of the mediastinum and the coronary arteries, including calcified atherosclerotic plaque in the left main, left anterior descending, left circumflex and right coronary arteries. Markedly enlarged and heterogeneous appearing thyroid gland with apparent substernal extension, similar to prior examinations, favored to represent a severe multinodular goiter. The lung of the esophagus is unremarkable in appearance. No pathologically enlarged mediastinal, hilar or bilateral internal mammary lymph nodes.  Lungs/Pleura: No suspicious appearing pulmonary nodules or masses are identified at this time.  Thin-walled cyst in the anterolateral aspect of the left upper lobe incidentally noted.  No acute consolidative airspace disease. Previously noted large right-sided pleural effusion has resolved, as has the previously noted small left pleural effusion.  There is a small amount of pleural thickening in the posterior aspect of the right hemithorax best demonstrated on image 38 of series 2.  Upper Abdomen: Diffuse calcifications of the wall of the gallbladder  redemonstrated.  Musculoskeletal: Status post right-sided modified radical mastectomy and right axillary nodal dissection.  No definite soft tissue mass identified along the right chest wall or within the right axillary region to suggest local recurrence of disease or new nodal disease.  As with the prior examination there is soft tissue stranding and numerous borderline enlarged lymph nodes throughout the left axilla, which is nonspecific, but  appears slightly decreased compared to prior examination. There are no aggressive appearing lytic or blastic lesions noted in the visualized portions of the skeleton.  IMPRESSION: 1.  Status post right-sided modified radical mastectomy and axillary nodal dissection, without evidence to suggest local recurrence of disease or new metastatic disease to the thorax. Previously noted right-sided pleural effusion has essentially resolved, although there is a small amount of residual pleural thickening in the posterior aspect of the right hemithorax, likely indicative of residual metastatic disease. 2. Soft tissue stranding and enlarged lymph nodes in the left axilla appears slightly decreased compared to the prior study. 3. Atherosclerosis, including left main and three-vessel coronary artery disease.   Assessment for potential risk factor modification, dietary therapy or pharmacologic therapy may be warranted, if clinically indicated. 4.  Mild cardiomegaly. 5.  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   Original Report Authenticated By: Trudie Reed, M.D.     ASSESSMENT: 67 y.o. [place] woman with stage 4 breast cancer  (1) status post RIGHT axillary lymph node biopsy 05/27/2010 for a clinical T4 N1, stage IIIB (inflammatory) invasive ductal carcinoma, grade not stated, estrogen receptor 94% positive, progesterone receptor 64% positive, with an MIB-1 of 60%, and HER-2 amplification by FISH with a ratio of  7.5.  (2) neoadjuvant chemotherapy consisted of 6 cycles of standard carboplatin, docetaxel, trastuzumab, with the trastuzumab held cycles 5 and 6 due to a drop in her borderline ejection fraction (from 45-50% at baseline to 40-45% January 2012)-- total trastuzumab treatment = 2 months  (3) status post right mastectomy and axillary lymph node dissection 12/01/2010, showing no residual invasive carcinoma in the breast, but residual tumor in 20 of 21 axillary lymph nodes. (Only one of the 20 positive lymph nodes showed a significant residual tumor, measuring 5 mm; the remaining lymph nodes, including the evidence of extracapsular extension, showed single and clusters a few neoplastic cells with extensive therapy related changes).  (4) postmastectomy radiation to the right chest wall, axilla, and supraclavicular region completed 03/07/2011  (5) additional 2 months of trastuzumab given February through April 2013, again discontinued because of a drop in the ejection fraction and lateral S'  (6) on letrozole July 2012 to September 2013  (7) LEFT axillary adenopathy noted by Dr. Carolynne Edouard on exam July 2013, with biopsy of a left axillary node 04/17/2012 showing an invasive ductal carcinoma, estrogen receptor 91% positive, progesterone receptor 9% positive, with an MIB-1 of 45%, and no HER-2 amplification. This tumor is morphologically identical to her prior Right breast cancer.  (8) PET scan 04/03/2012 showed multiple enlarged left axillary and small left supraclavicular lymph nodes that are moderately hypermetabolic, with some hypermetabolic activity within the left axillary tail. No other hypermetabolic lymph nodes identified. There was no involvement of the right axilla, mediastinum or right chest wall and no extrathoracic metastases were demonstrated.  (8) on exemestane and everolimus September through December 2013  (9) s/p RIGHT thoracentesis 08/01/2012, cytologically positive; s/p temporary Pleurx;  effusion resolved after Abraxane therapy  (10) Abraxane started 08/16/2012, given day 1 and day 8 of each 21 day cycle, completed 6 cycles (12 doses) 12/06/2012, with excellent response  (11) fulvestrant started 12/19/2012  (12) trastuzumab resumed 12/21/2011. We will schedule the doses 4 weeks apart to see if they can be better tolerated. Most recent echocardiogram 12/17/2012 showed an ejection fraction in the 65-70% range.  PLAN:  Ebony will proceed to treatment today as scheduled for her next q. 4 week  dose of trastuzumab, and her next monthly injection of fulvestrant. We will continue on monthly schedule. She'll have a repeat echocardiogram in early July, and we'll see Dr. Darnelle Catalan in late July for followup. Just prior to that appointment, she'll also have a repeat chest x-ray.  If she has yet another drop in ejection fraction or problems with her S' we will switch to lapatinib.    Deven Audi    01/17/2013

## 2013-01-17 NOTE — Patient Instructions (Signed)
Patient aware of next appointment; discharged home with no complaints. 

## 2013-01-18 ENCOUNTER — Telehealth: Payer: Self-pay | Admitting: *Deleted

## 2013-01-18 NOTE — Telephone Encounter (Signed)
Per staff message and POF I have scheduled appts.  JMW  

## 2013-02-04 ENCOUNTER — Encounter (HOSPITAL_COMMUNITY): Payer: Self-pay

## 2013-02-04 ENCOUNTER — Ambulatory Visit (HOSPITAL_BASED_OUTPATIENT_CLINIC_OR_DEPARTMENT_OTHER)
Admission: RE | Admit: 2013-02-04 | Discharge: 2013-02-04 | Disposition: A | Discharge status: Home / Self Care | Payer: PRIVATE HEALTH INSURANCE | Source: Ambulatory Visit | Attending: Internal Medicine | Admitting: Internal Medicine

## 2013-02-04 ENCOUNTER — Ambulatory Visit (HOSPITAL_COMMUNITY)
Admission: RE | Admit: 2013-02-04 | Discharge: 2013-02-04 | Disposition: A | Discharge status: Home / Self Care | Payer: PRIVATE HEALTH INSURANCE | Source: Ambulatory Visit | Attending: Internal Medicine | Admitting: Internal Medicine

## 2013-02-04 ENCOUNTER — Other Ambulatory Visit: Payer: Self-pay | Admitting: *Deleted

## 2013-02-04 VITALS — BP 120/60 | HR 60 | Wt 238.0 lb

## 2013-02-04 DIAGNOSIS — C50919 Malignant neoplasm of unspecified site of unspecified female breast: Secondary | ICD-10-CM | POA: Insufficient documentation

## 2013-02-04 DIAGNOSIS — C50911 Malignant neoplasm of unspecified site of right female breast: Secondary | ICD-10-CM

## 2013-02-04 DIAGNOSIS — C50912 Malignant neoplasm of unspecified site of left female breast: Secondary | ICD-10-CM

## 2013-02-04 DIAGNOSIS — Z87891 Personal history of nicotine dependence: Secondary | ICD-10-CM | POA: Insufficient documentation

## 2013-02-04 DIAGNOSIS — I428 Other cardiomyopathies: Secondary | ICD-10-CM | POA: Insufficient documentation

## 2013-02-04 DIAGNOSIS — I10 Essential (primary) hypertension: Secondary | ICD-10-CM | POA: Insufficient documentation

## 2013-02-04 DIAGNOSIS — Z09 Encounter for follow-up examination after completed treatment for conditions other than malignant neoplasm: Secondary | ICD-10-CM

## 2013-02-04 NOTE — Patient Instructions (Addendum)
Follow up in 3 months with and ECHO

## 2013-02-04 NOTE — Assessment & Plan Note (Addendum)
Dr Gala Romney reviewed and discussed ECHO results. EF and lateral S' back down which coincides with resumption of Herceptin. Will need to stop Herceptin. Continue current dose of carvedilol. Will not up titrate due to low HR. She is not on ace due to angioedema. Follow up in 3 months with an ECHO.   Patient seen and examined with Tonye Becket, NP. We discussed all aspects of the encounter. I agree with the assessment and plan as stated above. Her LV function and Doppler parameters are back down again after re-initiation of Herceptin. Unfortunately, I think we will have to stop therapy permanently. I will let Dr. Darnelle Catalan know and discuss options.

## 2013-02-04 NOTE — Progress Notes (Signed)
*  PRELIMINARY RESULTS* Echocardiogram 2D Echocardiogram has been performed.  Jeanette Morris 02/04/2013, 3:09 PM

## 2013-02-04 NOTE — Progress Notes (Signed)
Patient ID: Jeanette Morris, female   DOB: March 22, 1946, 67 y.o.   MRN: [id number] HPI:  Jeanette Morris is a 67 y/o woman with h/o HTN and obesity. Denies any known h/o CAD or HF.  She was diagnosed with R Breast CA in 9/11. Underwent neoadjuvant chemo with (TCH regimen) followed by carboplatin and Taxotere x 6 cycles. She is status post rt mastectomy in April 2012 LN+. ER/PR/NER 2-neu +. Post-op had XRT. [name] would like to start her on Herceptin. However had echocardiogram on 01/06/11 which was read as EF 45-50% and so referred for cardiology clearance.  Her echos have been reviewed, dating back to 2003. EF has remained in 40-50% range without significant change. Lateral S' velocity also unchanged around 10.5 cm/sec - although some echos with poor windows.  With her EF running on the low end of the normal range for almost a decade and the importance of the Herceptin therapy, we cleared her for Herceptin therapy starting in September 2012 with concomitant therapy with ACE-I and b-blocker. However, there was concern that EF was drifting down and Herceptin was stopped in January and she has not been on since.  Echos: 05/03/11: 50-55%, lateral s' velocity around 11.5 cm/sec.   09/22/11: 50-55%, lateral s' 10.5-11 but poor window 12/21/11: 45-50% LVIDd 5.0 cm (up from 4.4) Lat s' 6.7cm/sec (poor windows) 02/02/12: 40-45%%   7.1 02/23/12 Cardiac MRI: EF 38%. No scarring 05/28/12 EF 45% lateral S' 10.1 07/23/12 EF50-55%  Lateral S' 11.2 10/22/12: EF ~50-55% lat s ' 10.1 12/17/12: EF 60% lat s' 12.8 02/04/13: EF 40-45%  Lat s'  8.1.   She returns for follow up today.  She resumed Herceptin 12/20/12. Has gotten 2 dose. Denies SOB/PND/Orthopnea. She is not on and Ace due to angioedema.  . ROS: All other systems normal except as mentioned in HPI, past medical history and problem list.    Past Medical History  Diagnosis Date  . Hypertension   . Hearing loss     right ear  . Hyperlipidemia   . Arthritis   . 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 Breast cancer     (Rt) breast ca dx 9/11  . Dysrhythmia     A fib with IV chemo treatments  . Shortness of breath     Current Outpatient Prescriptions  Medication Sig Dispense Refill  . aspirin 81 MG tablet Take 81 mg by mouth daily.      . Calcium Carbonate-Vitamin D 600-400 MG-UNIT per tablet Take 1 tablet by mouth every morning.       . carvedilol (COREG) 12.5 MG tablet Take 1 tablet (12.5 mg total) by mouth 2 (two) times daily with a meal. Take With 6.25 mg tablet to make a total strength of 18.75mg       . carvedilol (COREG) 6.25 MG tablet Take With 12.5 mg tablet to make a total strength of 18.75mg   60 tablet  3  . cyanocobalamin 500 MCG tablet Take 500 mcg by mouth daily.      . ferrous sulfate 325 (65 FE) MG tablet Take 1 tablet (325 mg total) by mouth daily with breakfast.  30 tablet  1  . gabapentin (NEURONTIN) 300 MG capsule Take 300 mg by mouth at bedtime.       . lidocaine-prilocaine (EMLA) cream as needed.       Marland Kitchen oxyCODONE-acetaminophen (PERCOCET/ROXICET) 5-325 MG per tablet as needed.       Marland Kitchen spironolactone (ALDACTONE) 25 MG  tablet Take 1 tablet (25 mg total) by mouth daily.  30 tablet  6   No current facility-administered medications for this encounter.     No Known Allergies   PHYSICAL EXAM: Filed Vitals:   02/04/13 1450  BP: 120/60  Pulse: 60  Weight: 238 lb (107.956 kg)  SpO2: 100%   General:  Obese. Chronically ill appearing.  No respiratory difficulty  HEENT: normal Neck: supple. no JVD. Carotids 2+ bilat; no bruits. No lymphadenopathy or thryomegaly appreciated. S/p R mastectomy Cor: PMI nondisplaced. Regular rate & rhythm. No rubs, gallops or murmurs. Lungs: clear bilaterally Abdomen: obese soft, nontender, nondistended. No hepatosplenomegaly. No bruits or masses. Good bowel sounds. Extremities: no cyanosis, clubbing, rash, edema.  Rt arm lymphedema Neuro: alert & oriented x 3, cranial nerves grossly intact. moves  all 4 extremities w/o difficulty. Affect pleasant.    ASSESSMENT & PLAN:

## 2013-02-07 ENCOUNTER — Other Ambulatory Visit: Payer: Self-pay | Admitting: Oncology

## 2013-02-08 ENCOUNTER — Telehealth: Payer: Self-pay | Admitting: *Deleted

## 2013-02-08 ENCOUNTER — Other Ambulatory Visit: Payer: Self-pay | Admitting: Physician Assistant

## 2013-02-08 NOTE — Progress Notes (Signed)
I spoke with Jeanette Morris by phone today. We've received a copy of her echocardiogram from 02/04/2013, as well as a followup note from [name].  He has recommended that [name] discontinue trastuzumab.  I have reviewed this case with Dr. Darnelle Catalan, and  I called Jeanette Morris to make her aware of these changes. Of course we will continue with Faslodex injections monthly. She is  scheduled for her next injection next week on June 19 and would like to see Dr. Darnelle Catalan briefly at that time to review her treatment plan due to this change. This is being scheduled for her accordingly.  Zollie Scale, PA-C 02/08/2013

## 2013-02-08 NOTE — Telephone Encounter (Signed)
Lm gv appt d/t for 02/14/13 labs@11am , ov@11 :30am, and inj to follow. Pt is aware...td

## 2013-02-14 ENCOUNTER — Ambulatory Visit (HOSPITAL_BASED_OUTPATIENT_CLINIC_OR_DEPARTMENT_OTHER): Payer: PRIVATE HEALTH INSURANCE | Admitting: Oncology

## 2013-02-14 ENCOUNTER — Ambulatory Visit: Payer: PRIVATE HEALTH INSURANCE

## 2013-02-14 ENCOUNTER — Other Ambulatory Visit: Payer: PRIVATE HEALTH INSURANCE | Admitting: Lab

## 2013-02-14 ENCOUNTER — Telehealth: Payer: Self-pay | Admitting: *Deleted

## 2013-02-14 ENCOUNTER — Other Ambulatory Visit (HOSPITAL_BASED_OUTPATIENT_CLINIC_OR_DEPARTMENT_OTHER): Payer: PRIVATE HEALTH INSURANCE | Admitting: Lab

## 2013-02-14 VITALS — BP 154/86 | HR 72 | Temp 98.4°F | Resp 20 | Ht 65.0 in | Wt 236.7 lb

## 2013-02-14 DIAGNOSIS — C50912 Malignant neoplasm of unspecified site of left female breast: Secondary | ICD-10-CM

## 2013-02-14 DIAGNOSIS — C50911 Malignant neoplasm of unspecified site of right female breast: Secondary | ICD-10-CM

## 2013-02-14 DIAGNOSIS — C50619 Malignant neoplasm of axillary tail of unspecified female breast: Secondary | ICD-10-CM

## 2013-02-14 DIAGNOSIS — I890 Lymphedema, not elsewhere classified: Secondary | ICD-10-CM

## 2013-02-14 DIAGNOSIS — Z5111 Encounter for antineoplastic chemotherapy: Secondary | ICD-10-CM

## 2013-02-14 DIAGNOSIS — C773 Secondary and unspecified malignant neoplasm of axilla and upper limb lymph nodes: Secondary | ICD-10-CM

## 2013-02-14 LAB — COMPREHENSIVE METABOLIC PANEL (CC13)
Alkaline Phosphatase: 73 U/L (ref 40–150)
BUN: 16.9 mg/dL (ref 7.0–26.0)
Glucose: 109 mg/dl — ABNORMAL HIGH (ref 70–99)
Sodium: 140 mEq/L (ref 136–145)
Total Bilirubin: 0.42 mg/dL (ref 0.20–1.20)
Total Protein: 7.8 g/dL (ref 6.4–8.3)

## 2013-02-14 LAB — CBC WITH DIFFERENTIAL/PLATELET
Eosinophils Absolute: 0.1 10*3/uL (ref 0.0–0.5)
LYMPH%: 28.7 % (ref 14.0–49.7)
MCH: 30.7 pg (ref 25.1–34.0)
MCHC: 33.9 g/dL (ref 31.5–36.0)
MCV: 90.7 fL (ref 79.5–101.0)
MONO%: 12.4 % (ref 0.0–14.0)
NEUT#: 1.8 10*3/uL (ref 1.5–6.5)
Platelets: 192 10*3/uL (ref 145–400)
RBC: 3.17 10*6/uL — ABNORMAL LOW (ref 3.70–5.45)

## 2013-02-14 NOTE — Progress Notes (Signed)
Faslodex injections given during office visit by Rona Ravens, RN.

## 2013-02-14 NOTE — Telephone Encounter (Signed)
appts made and printed. Lm with Jeanette Morris for the pt to get scheduled for the lymphedema clinic...td

## 2013-02-14 NOTE — Progress Notes (Signed)
ID: Jeanette Morris   DOB: 10/31/1945  MR#: [id number]  CSN#:[id number]  PCP: Egbert Garibaldi, NP GYN:  SUFelicity Pellegrini OTHER MD: Arvilla Meres, Antony Blackbird, Rexanne Mano   HISTORY OF PRESENT ILLNESS: The patient undergoes annual screening mammography.  Her mammograms previously had fullness.  In August 2011 she presented with some firmness of her right breast. She also noted some skin thickening around her nipple and some tenderness.  She was subsequently seen for this on 05/13/2010 and was referred for bilateral mammogram and right breast ultrasound.  There was concern that this may be mastitis.  There were diffuse skin thickening, tissue edema, enlarged lymph node in right axilla, some subareolar distortion secondary to the previous surgery.  She was given a course of doxycycline for 10 days and asked to return. A follow-up ultrasound of the breast showed persistent abnormalities.  She was referred for biopsy.  She underwent biopsy of the right axilla on 05/27/2010 and it showed metastatic carcinoma consistent with breast primary.  This was ER and PR positive, HER-2 was positive with a ratio of 3.09.  The ER ratio is 34%, PR was 64%, proliferative index 60%.  She subsequently had a MRI scan of both breasts on 06/03/2010 and this showed a area of minimal enhancement in the right breast measuring 10.0 x 6.0 x 3.0 cm.  Enlarged lymph nodes were seen in the right axilla.  Largest node measuring 3.0 x 1.0 x 7.0 cm.  Intramammary lymph nodes were also seen in the outer left breast, the largest measuring 2.8 x 2.5 cm.  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 The patient's subsequent history is as detailed below.  INTERVAL HISTORY: Jeanette Morris returns today for followup of her metastatic breast cancer accompanied by her husband [name]. Since her last visit here she had another echocardiogram and cardiology found her ejection fraction and S' are  again decreased. They recommend discontinuing Herceptin and this is being operationalized  REVIEW OF SYSTEMS: Ohana herself is doing "okay". What bothers her the most is the lymphedema of the right arm. The daytime sleepiness all right but the daytime glove is very tight and makes her fingertips blue. If she wears a sleeve without the glove than the hand swells. At night the pump she uses is very uncomfortable and doesn't c LEEP. I think she would benefit from reviewing all these things I have put in a referral for her are physical therapy group to help her out. Aside from these issues she has a poor appetite, has pain in the right axillary area, which is mild and unchanged from baseline, and has rare headaches perhaps once or twice a month, but no dizziness, gait imbalance, visual changes, nausea, or vomiting. A detailed review of systems was otherwise negative.  PAST MEDICAL HISTORY: Past Medical History  Diagnosis Date  . Hypertension   . Hearing loss     right ear  . Hyperlipidemia   . Arthritis   . 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 Breast cancer     (Rt) breast ca dx 9/11  . Dysrhythmia     A fib with IV chemo treatments  . Shortness of breath     PAST SURGICAL HISTORY: Past Surgical History  Procedure Laterality Date  . Tubal ligation    . Portacath placement    . Abdominal hysterectomy    . Lung biopsy    .  Lesion excision      L anterior thigh  . Breast surgery  12/01/10    ER+,PR+,Ki 67 64%,Her2 -; right breast lumpectomy  . Chest tube insertion  08/08/2012    Procedure: INSERTION PLEURAL DRAINAGE CATHETER;  Surgeon: Alleen Borne, MD;  Location: [facility] OR;  Service: Thoracic;  Laterality: Right;  . Removal of pleural drainage catheter Right 10/10/2012    Procedure: REMOVAL OF PLEURAL DRAINAGE CATHETER;  Surgeon: Alleen Borne, MD;  Location: [facility] OR;  Service: Thoracic;  Laterality: Right;    FAMILY HISTORY No family history on file. The patient's father  died in an automobile accident at age 73. The patient's mother died at age 48 with Alzheimer's disease. The patient had 8 brothers and 4 sisters. There is no history of breast or ovarian cancer in the family.  GYNECOLOGIC HISTORY: Menarche age 79, first live birth age 37. She is GX P7. She underwent menopause "more than 20 years ago". She never took hormone replacement.  SOCIAL HISTORY: She has been mostly a housewife. Her husband [name] used to be a cook at [employer]. He completed radiation therapy for prostate cancer under [facility] April of 2014. The patient's children are from an earlier marriage, and include Kerri Perches, [place], [address], [address], Danielle [place], [place], and [employer]. The patient has 15 grandchildren and 1 great-grandchild. She at tends [place]. [employer]  ADVANCED DIRECTIVES: Not in place  HEALTH MAINTENANCE: History  Substance Use Topics  . Smoking status: Former Smoker    Quit date: 06/30/1973  . Smokeless tobacco: Never Used  . Alcohol Use: No     Colonoscopy:  PAP:  Bone density:  Lipid panel:  No Known Allergies  Current Outpatient Prescriptions  Medication Sig Dispense Refill  . aspirin 81 MG tablet Take 81 mg by mouth daily.      . Calcium Carbonate-Vitamin D 600-400 MG-UNIT per tablet Take 1 tablet by mouth every morning.       . carvedilol (COREG) 12.5 MG tablet Take 1 tablet (12.5 mg total) by mouth 2 (two) times daily with a meal. Take With 6.25 mg tablet to make a total strength of 18.75mg       . carvedilol (COREG) 6.25 MG tablet Take With 12.5 mg tablet to make a total strength of 18.75mg   60 tablet  3  . cyanocobalamin 500 MCG tablet Take 500 mcg by mouth daily.      . ferrous sulfate 325 (65 FE) MG tablet Take 1 tablet (325 mg total) by mouth daily with breakfast.  30 tablet  1  . gabapentin (NEURONTIN) 300 MG capsule Take 300 mg by mouth at bedtime.       . lidocaine-prilocaine (EMLA) cream as  needed.       Marland Kitchen oxyCODONE-acetaminophen (PERCOCET/ROXICET) 5-325 MG per tablet as needed.       Marland Kitchen spironolactone (ALDACTONE) 25 MG tablet Take 1 tablet (25 mg total) by mouth daily.  30 tablet  6   No current facility-administered medications for this visit.    OBJECTIVE: Elderly African American woman in no acute distress Filed Vitals:   02/14/13 1103  BP: 154/86  Pulse: 72  Temp: 98.4 F (36.9 C)  Resp: 20   Filed Weights   02/14/13 1103  Weight: 236 lb 11.2 oz (107.366 kg)      Body mass index is 39.39 kg/(m^2).    ECOG FS: 2  Sclerae unicteric Oropharynx clear No cervical or supraclavicular adenopathy  Lungs no rales or rhonchi; bilateral excursion is fair  Heart regular rate and rhythm, no murmur or S3 appreciated Abdomen  obese, soft, nontender, positive bowel sounds  MSK no focal spinal tenderness; right upper extremity lymphedema, stable Neuro: nonfocal, well oriented, positive affect Breasts: The patient is status post right mastectomy with no skin changes, nodularity, or evidence of local recurrence. Axillae are benign bilaterally with no palpable adenopathy. Port is intact the left upper chest wall   LAB RESULTS: Lab Results  Component Value Date   WBC 3.2* 02/14/2013   NEUTROABS 1.8 02/14/2013   HGB 9.7* 02/14/2013   HCT 28.8* 02/14/2013   MCV 90.7 02/14/2013   PLT 192 02/14/2013      Chemistry      Component Value Date/Time   NA 139 01/17/2013 1235   NA 139 08/08/2012 0658   K 4.3 01/17/2013 1235   K 3.6 08/08/2012 0658   CL 107 01/17/2013 1235   CL 103 08/08/2012 0658   CO2 24 01/17/2013 1235   CO2 28 08/08/2012 0658   BUN 22.8 01/17/2013 1235   BUN 15 08/08/2012 0658   CREATININE 1.1 01/17/2013 1235   CREATININE 1.56* 08/08/2012 0658      Component Value Date/Time   CALCIUM 8.9 01/17/2013 1235   CALCIUM 9.7 08/08/2012 0658   ALKPHOS 77 01/17/2013 1235   ALKPHOS 91 08/08/2012 0658   AST 17 01/17/2013 1235   AST 86* 08/08/2012 0658   ALT 13 01/17/2013  1235   ALT 117* 08/08/2012 0658   BILITOT 0.42 01/17/2013 1235   BILITOT 0.3 08/08/2012 0658       Lab Results  Component Value Date   LABCA2 95* 08/14/2012    STUDIES: No results found. Echo 02/04/2013 reviewed  ASSESSMENT: 67 y.o. [place] woman with stage 4 breast cancer  (1) status post RIGHT axillary lymph node biopsy 05/27/2010 for a clinical T4 N1, stage IIIB (inflammatory) invasive ductal carcinoma, grade not stated, estrogen receptor 94% positive, progesterone receptor 64% positive, with an MIB-1 of 60%, and HER-2 amplification by FISH with a ratio of 7.5.  (2) neoadjuvant chemotherapy consisted of 6 cycles of standard carboplatin, docetaxel, trastuzumab, with the trastuzumab held cycles 5 and 6 due to a drop in her borderline ejection fraction (from 45-50% at baseline to 40-45% January 2012)-- total trastuzumab treatment = 2 months  (3) status post right mastectomy and axillary lymph node dissection 12/01/2010, showing no residual invasive carcinoma in the breast, but residual tumor in 20 of 21 axillary lymph nodes. (Only one of the 20 positive lymph nodes showed a significant residual tumor, measuring 5 mm; the remaining lymph nodes, including the evidence of extracapsular extension, showed single and clusters a few neoplastic cells with extensive therapy related changes).  (4) postmastectomy radiation to the right chest wall, axilla, and supraclavicular region completed 03/07/2011  (5) additional 2 months of trastuzumab given February through April 2013, again discontinued because of a drop in the ejection fraction and lateral S'  (6) on letrozole July 2012 to September 2013  (7) LEFT axillary adenopathy noted by Dr. Carolynne Edouard on exam July 2013, with biopsy of a left axillary node 04/17/2012 showing an invasive ductal carcinoma, estrogen receptor 91% positive, progesterone receptor 9% positive, with an MIB-1 of 45%, and no HER-2 amplification. This tumor is morphologically  identical to her prior Right breast cancer.  (8) PET scan 04/03/2012 showed multiple enlarged left axillary and small left supraclavicular lymph nodes that are moderately hypermetabolic, with some hypermetabolic activity  within the left axillary tail. No other hypermetabolic lymph nodes identified. There was no involvement of the right axilla, mediastinum or right chest wall and no extrathoracic metastases were demonstrated.  (8) on exemestane and everolimus September through December 2013  (9) s/p RIGHT thoracentesis 08/01/2012, cytologically positive; s/p temporary Pleurx; effusion resolved after Abraxane therapy  (10) Abraxane started 08/16/2012, given day 1 and day 8 of each 21 day cycle, completed 6 cycles (12 doses) 12/06/2012, with excellent response  (11) fulvestrant started 12/19/2012  (12) trastuzumab resumed 12/21/2011. We scheduled the doses 4 weeks apart to see if they can be better tolerated. However echo 02/04/2013 again showed a drop in the EF and S', so Herceptin stopped (last dose 01/17/2013)  PLAN:  Kaytee will continue the fulvestrant monthly, and we will also flush her port monthly at the same time. The question is whether we should start lapatinib now or wait. We discussed this for a considerable period of time. Generally, if we could control her cancer with fulvestrant alone, that would be best, because lapatinib Jan also weaken the heart, just like Herceptin, although less frequently.   Accordingly we are going to hold the lapatinib until after her next echo, which will be sometime in mid September. In the meantime we will follow her with chest x-rays, scheduled for July 31 and September 30. Also have urged her to call us if she has more shortness of breath, any pleurisy, or any symptoms of worsening anemia such as palpitations or feeling faint.  I am referring her back to the lymphedema clinic because she is having problems with her glove of and also the nighttime  drainage system. Otherwise [place] notable for any problems that may develop before the next visit here.   MAGRINAT,GUSTAV C    02/14/2013

## 2013-02-15 MED ORDER — FULVESTRANT 250 MG/5ML IM SOLN
500.0000 mg | INTRAMUSCULAR | Status: DC
  Administered 2013-02-14: 500 mg via INTRAMUSCULAR

## 2013-02-15 NOTE — Addendum Note (Signed)
Addended by: Billey Co on: 02/15/2013 04:19 PM   Modules accepted: Orders

## 2013-02-20 ENCOUNTER — Encounter (HOSPITAL_COMMUNITY): Payer: PRIVATE HEALTH INSURANCE

## 2013-02-28 ENCOUNTER — Ambulatory Visit: Payer: PRIVATE HEALTH INSURANCE | Attending: Oncology | Admitting: Physical Therapy

## 2013-02-28 DIAGNOSIS — IMO0001 Reserved for inherently not codable concepts without codable children: Secondary | ICD-10-CM | POA: Insufficient documentation

## 2013-02-28 DIAGNOSIS — I890 Lymphedema, not elsewhere classified: Secondary | ICD-10-CM | POA: Insufficient documentation

## 2013-02-28 DIAGNOSIS — M25619 Stiffness of unspecified shoulder, not elsewhere classified: Secondary | ICD-10-CM | POA: Insufficient documentation

## 2013-02-28 DIAGNOSIS — M24519 Contracture, unspecified shoulder: Secondary | ICD-10-CM | POA: Insufficient documentation

## 2013-03-04 ENCOUNTER — Ambulatory Visit: Payer: PRIVATE HEALTH INSURANCE

## 2013-03-06 ENCOUNTER — Ambulatory Visit: Payer: PRIVATE HEALTH INSURANCE

## 2013-03-08 ENCOUNTER — Ambulatory Visit: Payer: PRIVATE HEALTH INSURANCE

## 2013-03-11 ENCOUNTER — Ambulatory Visit: Payer: PRIVATE HEALTH INSURANCE | Admitting: Physical Therapy

## 2013-03-13 ENCOUNTER — Ambulatory Visit: Payer: PRIVATE HEALTH INSURANCE

## 2013-03-14 ENCOUNTER — Ambulatory Visit: Payer: PRIVATE HEALTH INSURANCE

## 2013-03-14 ENCOUNTER — Ambulatory Visit (HOSPITAL_BASED_OUTPATIENT_CLINIC_OR_DEPARTMENT_OTHER): Payer: PRIVATE HEALTH INSURANCE

## 2013-03-14 ENCOUNTER — Other Ambulatory Visit (HOSPITAL_BASED_OUTPATIENT_CLINIC_OR_DEPARTMENT_OTHER): Payer: PRIVATE HEALTH INSURANCE | Admitting: Lab

## 2013-03-14 VITALS — BP 165/81 | HR 61 | Temp 97.5°F | Resp 20

## 2013-03-14 DIAGNOSIS — C50911 Malignant neoplasm of unspecified site of right female breast: Secondary | ICD-10-CM

## 2013-03-14 DIAGNOSIS — Z5111 Encounter for antineoplastic chemotherapy: Secondary | ICD-10-CM

## 2013-03-14 DIAGNOSIS — C773 Secondary and unspecified malignant neoplasm of axilla and upper limb lymph nodes: Secondary | ICD-10-CM

## 2013-03-14 DIAGNOSIS — C50619 Malignant neoplasm of axillary tail of unspecified female breast: Secondary | ICD-10-CM

## 2013-03-14 LAB — COMPREHENSIVE METABOLIC PANEL (CC13)
ALT: 14 U/L (ref 0–55)
BUN: 18.6 mg/dL (ref 7.0–26.0)
CO2: 29 mEq/L (ref 22–29)
Calcium: 9.4 mg/dL (ref 8.4–10.4)
Chloride: 104 mEq/L (ref 98–109)
Creatinine: 1.2 mg/dL — ABNORMAL HIGH (ref 0.6–1.1)
Glucose: 105 mg/dl (ref 70–140)

## 2013-03-14 LAB — CBC WITH DIFFERENTIAL/PLATELET
BASO%: 0.7 % (ref 0.0–2.0)
Basophils Absolute: 0 10*3/uL (ref 0.0–0.1)
Eosinophils Absolute: 0.1 10*3/uL (ref 0.0–0.5)
HCT: 30.6 % — ABNORMAL LOW (ref 34.8–46.6)
HGB: 10.1 g/dL — ABNORMAL LOW (ref 11.6–15.9)
LYMPH%: 32 % (ref 14.0–49.7)
MONO#: 0.5 10*3/uL (ref 0.1–0.9)
NEUT#: 2.2 10*3/uL (ref 1.5–6.5)
NEUT%: 51.8 % (ref 38.4–76.8)
Platelets: 202 10*3/uL (ref 145–400)
WBC: 4.2 10*3/uL (ref 3.9–10.3)
lymph#: 1.3 10*3/uL (ref 0.9–3.3)

## 2013-03-14 MED ORDER — HEPARIN SOD (PORK) LOCK FLUSH 100 UNIT/ML IV SOLN
500.0000 [IU] | Freq: Once | INTRAVENOUS | Status: AC
  Administered 2013-03-14: 500 [IU] via INTRAVENOUS
  Filled 2013-03-14: qty 5

## 2013-03-14 MED ORDER — FULVESTRANT 250 MG/5ML IM SOLN
500.0000 mg | INTRAMUSCULAR | Status: DC
  Administered 2013-03-14: 500 mg via INTRAMUSCULAR
  Filled 2013-03-14: qty 10

## 2013-03-14 MED ORDER — SODIUM CHLORIDE 0.9 % IJ SOLN
10.0000 mL | INTRAMUSCULAR | Status: DC | PRN
  Administered 2013-03-14: 10 mL via INTRAVENOUS
  Filled 2013-03-14: qty 10

## 2013-03-14 NOTE — Progress Notes (Signed)
Faslodex injection given in flush room 

## 2013-03-14 NOTE — Patient Instructions (Signed)
Patient to call MD for problems

## 2013-03-15 ENCOUNTER — Ambulatory Visit: Payer: PRIVATE HEALTH INSURANCE | Admitting: Physical Therapy

## 2013-03-18 ENCOUNTER — Ambulatory Visit: Payer: PRIVATE HEALTH INSURANCE

## 2013-03-20 ENCOUNTER — Ambulatory Visit: Payer: PRIVATE HEALTH INSURANCE

## 2013-03-20 ENCOUNTER — Other Ambulatory Visit (HOSPITAL_COMMUNITY): Payer: Self-pay | Admitting: Oncology

## 2013-03-22 ENCOUNTER — Ambulatory Visit: Payer: PRIVATE HEALTH INSURANCE

## 2013-03-25 ENCOUNTER — Ambulatory Visit: Payer: PRIVATE HEALTH INSURANCE

## 2013-03-27 ENCOUNTER — Ambulatory Visit: Payer: PRIVATE HEALTH INSURANCE

## 2013-03-28 ENCOUNTER — Encounter: Payer: Self-pay | Admitting: Physician Assistant

## 2013-03-28 ENCOUNTER — Other Ambulatory Visit (HOSPITAL_BASED_OUTPATIENT_CLINIC_OR_DEPARTMENT_OTHER): Payer: PRIVATE HEALTH INSURANCE | Admitting: Lab

## 2013-03-28 ENCOUNTER — Other Ambulatory Visit: Payer: PRIVATE HEALTH INSURANCE | Admitting: Lab

## 2013-03-28 ENCOUNTER — Ambulatory Visit: Payer: PRIVATE HEALTH INSURANCE | Admitting: Oncology

## 2013-03-28 ENCOUNTER — Ambulatory Visit (HOSPITAL_BASED_OUTPATIENT_CLINIC_OR_DEPARTMENT_OTHER): Payer: PRIVATE HEALTH INSURANCE | Admitting: Physician Assistant

## 2013-03-28 ENCOUNTER — Telehealth: Payer: Self-pay | Admitting: Oncology

## 2013-03-28 ENCOUNTER — Ambulatory Visit (HOSPITAL_COMMUNITY)
Admission: RE | Admit: 2013-03-28 | Discharge: 2013-03-28 | Disposition: A | Discharge status: Home / Self Care | Payer: PRIVATE HEALTH INSURANCE | Source: Ambulatory Visit | Attending: Physician Assistant | Admitting: Physician Assistant

## 2013-03-28 VITALS — BP 138/74 | HR 57 | Temp 98.1°F | Resp 20 | Ht 65.0 in | Wt 237.3 lb

## 2013-03-28 DIAGNOSIS — C50919 Malignant neoplasm of unspecified site of unspecified female breast: Secondary | ICD-10-CM

## 2013-03-28 DIAGNOSIS — Z9010 Acquired absence of unspecified breast and nipple: Secondary | ICD-10-CM | POA: Insufficient documentation

## 2013-03-28 DIAGNOSIS — C50911 Malignant neoplasm of unspecified site of right female breast: Secondary | ICD-10-CM

## 2013-03-28 DIAGNOSIS — Z5189 Encounter for other specified aftercare: Secondary | ICD-10-CM

## 2013-03-28 DIAGNOSIS — C801 Malignant (primary) neoplasm, unspecified: Secondary | ICD-10-CM | POA: Insufficient documentation

## 2013-03-28 DIAGNOSIS — C800 Disseminated malignant neoplasm, unspecified: Secondary | ICD-10-CM

## 2013-03-28 DIAGNOSIS — C7800 Secondary malignant neoplasm of unspecified lung: Secondary | ICD-10-CM

## 2013-03-28 LAB — CBC WITH DIFFERENTIAL/PLATELET
BASO%: 0.5 % (ref 0.0–2.0)
Eosinophils Absolute: 0.2 10*3/uL (ref 0.0–0.5)
HCT: 31.7 % — ABNORMAL LOW (ref 34.8–46.6)
MCHC: 32.8 g/dL (ref 31.5–36.0)
MONO#: 0.5 10*3/uL (ref 0.1–0.9)
NEUT#: 2.7 10*3/uL (ref 1.5–6.5)
NEUT%: 57 % (ref 38.4–76.8)
WBC: 4.7 10*3/uL (ref 3.9–10.3)
lymph#: 1.2 10*3/uL (ref 0.9–3.3)

## 2013-03-28 LAB — COMPREHENSIVE METABOLIC PANEL (CC13)
Alkaline Phosphatase: 75 U/L (ref 40–150)
BUN: 21.2 mg/dL (ref 7.0–26.0)
Glucose: 111 mg/dl (ref 70–140)
Total Bilirubin: 0.33 mg/dL (ref 0.20–1.20)

## 2013-03-28 MED ORDER — OXYCODONE-ACETAMINOPHEN 5-325 MG PO TABS: 1.0000 | ORAL_TABLET | Freq: Three times a day (TID) | ORAL | Status: DC | PRN

## 2013-03-28 NOTE — Progress Notes (Signed)
ID: Jeanette Morris   DOB: [birth year]  MR#: [id number]  CSN#:[id number]  PCP: Egbert Garibaldi, NP GYN:  SUFelicity Pellegrini OTHER MD: Arvilla Meres, Antony Blackbird, Rexanne Mano   HISTORY OF PRESENT ILLNESS: The patient undergoes annual screening mammography.  Her mammograms previously had fullness.  In August 2011 she presented with some firmness of her right breast. She also noted some skin thickening around her nipple and some tenderness.  She was subsequently seen for this on 05/13/2010 and was referred for bilateral mammogram and right breast ultrasound.  There was concern that this may be mastitis.  There were diffuse skin thickening, tissue edema, enlarged lymph node in right axilla, some subareolar distortion secondary to the previous surgery.  She was given a course of doxycycline for 10 days and asked to return. A follow-up ultrasound of the breast showed persistent abnormalities.  She was referred for biopsy.  She underwent biopsy of the right axilla on 05/27/2010 and it showed metastatic carcinoma consistent with breast primary.  This was ER and PR positive, HER-2 was positive with a ratio of 3.09.  The ER ratio is 34%, PR was 64%, proliferative index 60%.  She subsequently had a MRI scan of both breasts on 06/03/2010 and this showed a area of minimal enhancement in the right breast measuring 10.0 x 6.0 x 3.0 cm.  Enlarged lymph nodes were seen in the right axilla.  Largest node measuring 3.0 x 1.0 x 7.0 cm.  Intramammary lymph nodes were also seen in the outer left breast, the largest measuring 2.8 x 2.5 cm.  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 The patient's subsequent history is as detailed below.  INTERVAL HISTORY: Jeanette Morris returns today accompanied by her husband [name] for followup of her metastatic breast cancer. [name] has just completed treatment for prostate cancer and is doing well.   Jeanette Morris continues to receive  fulvestrant monthly basis, last given 2 weeks ago, and due again on August 14. Jeanette Morris tells me she "logs the shots", primarily because he keeps her from taking a lot of pills, and prevents her from having IV chemotherapy. She tells me it is the Z., convenient, and she has no obvious side effects.   A chest x-ray obtained earlier this morning showed no radiographic evidence of cardiopulmonary disease and no pulmonary nodules or masses. There were no pleural effusions. Once again, the porcelain gallbladder was noted.    REVIEW OF SYSTEMS: Jeanette Morris herself  is feeling well. Her biggest complaint continues to be the lymphedema in her right upper extremity which does cause some discomfort and pain, especially in the right shoulder. She takes oxycodone/APAP occasionally and appropriately, and she does request a refill on that today. She's been followed regularly at the lymphedema clinic, and currently the right upper tremor he is wrapped.   Otherwise, Jeanette Morris has had no fevers or chills. She tells me she used some  Secret deoderant last week and developed a small red bump in the left axilla. She denies any additional rashes or skin changes.   she's had no signs of abnormal bleeding. Her appetite is slightly reduced, but she denies any nausea and has had no change in bowel habits. She has no increased cough or increased shortness of breath. She denies orthopnea, chest pressure, or chest pain. She's had no abnormal headaches, and denies any dizziness or change in vision.  A detailed review of systems is otherwise stable and  noncontributory.    PAST MEDICAL HISTORY: Past Medical History  Diagnosis Date  . Hypertension   . Hearing loss     right ear  . Hyperlipidemia   . Arthritis   . 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 Breast cancer     (Rt) breast ca dx 9/11  . Dysrhythmia     A fib with IV chemo treatments  . Shortness of breath     PAST SURGICAL HISTORY: Past Surgical History   Procedure Laterality Date  . Tubal ligation    . Portacath placement    . Abdominal hysterectomy    . Lung biopsy    . Lesion excision      L anterior thigh  . Breast surgery  12/01/10    ER+,PR+,Ki 67 64%,Her2 -; right breast lumpectomy  . Chest tube insertion  08/08/2012    Procedure: INSERTION PLEURAL DRAINAGE CATHETER;  Surgeon: Alleen Borne, MD;  Location: [facility] OR;  Service: Thoracic;  Laterality: Right;  . Removal of pleural drainage catheter Right 10/10/2012    Procedure: REMOVAL OF PLEURAL DRAINAGE CATHETER;  Surgeon: Alleen Borne, MD;  Location: [facility] OR;  Service: Thoracic;  Laterality: Right;    FAMILY HISTORY No family history on file. The patient's father died in an automobile accident at age 29. The patient's mother died at age 67 with Alzheimer's disease. The patient had 8 brothers and 4 sisters. There is no history of breast or ovarian cancer in the family.  GYNECOLOGIC HISTORY: Menarche age 45, first live birth age 75. She is GX P7. She underwent menopause "more than 20 years ago". She never took hormone replacement.  SOCIAL HISTORY: She has been mostly a housewife. Her husband [name] used to be a cook at [employer]. He completed radiation therapy for prostate cancer under [facility] April of 2014. The patient's children are from an earlier marriage, and include Kerri Perches, [place], [address], [address], Danielle [place], [place], and [employer]. The patient has 15 grandchildren and 1 great-grandchild. She at tends [place]. [employer]  ADVANCED DIRECTIVES: Not in place  HEALTH MAINTENANCE: History  Substance Use Topics  . Smoking status: Former Smoker    Quit date: 06/30/1973  . Smokeless tobacco: Never Used  . Alcohol Use: No     Colonoscopy:  PAP:  Bone density:  Lipid panel:  No Known Allergies  Current Outpatient Prescriptions  Medication Sig Dispense Refill  . aspirin 81 MG tablet Take 81 mg by mouth daily.      .  Calcium Carbonate-Vitamin D 600-400 MG-UNIT per tablet Take 1 tablet by mouth every morning.       . carvedilol (COREG) 12.5 MG tablet Take 1 tablet (12.5 mg total) by mouth 2 (two) times daily with a meal. Take With 6.25 mg tablet to make a total strength of 18.75mg       . carvedilol (COREG) 6.25 MG tablet Take With 12.5 mg tablet to make a total strength of 18.75mg   60 tablet  3  . cyanocobalamin 500 MCG tablet Take 500 mcg by mouth daily.      . ferrous sulfate 325 (65 FE) MG tablet Take 1 tablet (325 mg total) by mouth daily with breakfast.  30 tablet  1  . gabapentin (NEURONTIN) 300 MG capsule Take 300 mg by mouth at bedtime.       . lidocaine-prilocaine (EMLA) cream as needed.       Marland Kitchen oxyCODONE-acetaminophen (PERCOCET/ROXICET)  5-325 MG per tablet Take 1-2 tablets by mouth every 8 (eight) hours as needed.  90 tablet  0  . spironolactone (ALDACTONE) 25 MG tablet Take 1 tablet (25 mg total) by mouth daily.  30 tablet  6   No current facility-administered medications for this visit.   Facility-Administered Medications Ordered in Other Visits  Medication Dose Route Frequency Provider Last Rate Last Dose  . fulvestrant (FASLODEX) injection 500 mg  500 mg Intramuscular Q30 days Lowella Dell, MD   500 mg at 02/14/13 1100    OBJECTIVE: Elderly African American woman in no acute distress Filed Vitals:   03/28/13 1017  BP: 138/74  Pulse: 57  Temp: 98.1 F (36.7 C)  Resp: 20   Filed Weights   03/28/13 1017  Weight: 237 lb 4.8 oz (107.639 kg)      Body mass index is 39.49 kg/(m^2).    ECOG FS: 2  Sclerae unicteric Oropharynx clear, poor dentition No cervical or supraclavicular adenopathy  Lungs no rales or rhonchi; no wheezes; bilateral excursion is fair  Heart regular rate and rhythm, no murmur appreciated Abdomen  obese, soft, nontender to palpation, positive bowel sounds  MSK no focal spinal tenderness Right upper extremity lymphedema, stable, currently wrapped. Nonpitting  pedal edema bilaterally in the lower chest remedies, equal bilaterally. Neuro: nonfocal, well oriented, positive affect Breasts: The patient is status post right mastectomy with no skin changes, nodularity, or evidence of local recurrence. Left breast is thick and firm.  There is a small nodules palpated in the left axilla. Right axilla is unremarkable. Port is intact the left upper chest wall, no evidence of erythema or edema.    LAB RESULTS: Lab Results  Component Value Date   WBC 4.7 03/28/2013   NEUTROABS 2.7 03/28/2013   HGB 10.4* 03/28/2013   HCT 31.7* 03/28/2013   MCV 91.2 03/28/2013   PLT 219 03/28/2013      Chemistry      Component Value Date/Time   NA 140 03/28/2013 0948   NA 139 08/08/2012 0658   K 4.9 03/28/2013 0948   K 3.6 08/08/2012 0658   CL 106 02/14/2013 1048   CL 103 08/08/2012 0658   CO2 28 03/28/2013 0948   CO2 28 08/08/2012 0658   BUN 21.2 03/28/2013 0948   BUN 15 08/08/2012 0658   CREATININE 1.2* 03/28/2013 0948   CREATININE 1.56* 08/08/2012 0658      Component Value Date/Time   CALCIUM 9.7 03/28/2013 0948   CALCIUM 9.7 08/08/2012 0658   ALKPHOS 75 03/28/2013 0948   ALKPHOS 91 08/08/2012 0658   AST 18 03/28/2013 0948   AST 86* 08/08/2012 0658   ALT 10 03/28/2013 0948   ALT 117* 08/08/2012 0658   BILITOT 0.33 03/28/2013 0948   BILITOT 0.3 08/08/2012 0658       Lab Results  Component Value Date   LABCA2 95* 08/14/2012    STUDIES: Dg Chest 2 View  03/28/2013   *RADIOLOGY REPORT*  Clinical Data: Metastatic breast cancer.  Evaluate for response to therapy.  CHEST - 2 VIEW  Comparison: Chest x-ray 10/05/2012.  Findings: Postoperative changes of right-sided mastectomy and axillary lymph node dissection are again noted.  A left-sided single lumen Port-A-Cath with tip terminating in the proximal superior vena cava. Lung volumes are normal.  No consolidative airspace disease.  No pleural effusions.  No pneumothorax.  No pulmonary nodule or mass noted.  Pulmonary  vasculature and the cardiomediastinal silhouette are within normal limits.  A large rim calcified structure in the right upper quadrant of the abdomen.  IMPRESSION: 1. No radiographic evidence of acute cardiopulmonary disease. 2.  Postoperative changes and support apparatus, as above. 3.  Densely calcified structure projecting over the right upper quadrant of the abdomen likely represents a large gallstone or potential porcelain gallbladder as suggested on prior CT examinations.   Original Report Authenticated By: Trudie Reed, M.D.      ASSESSMENT: 67 y.o. [place] woman with stage 4 breast cancer  (1) status post RIGHT axillary lymph node biopsy 05/27/2010 for a clinical T4 N1, stage IIIB (inflammatory) invasive ductal carcinoma, grade not stated, estrogen receptor 94% positive, progesterone receptor 64% positive, with an MIB-1 of 60%, and HER-2 amplification by FISH with a ratio of 7.5.  (2) neoadjuvant chemotherapy consisted of 6 cycles of standard carboplatin, docetaxel, trastuzumab, with the trastuzumab held cycles 5 and 6 due to a drop in her borderline ejection fraction (from 45-50% at baseline to 40-45% January 2012)-- total trastuzumab treatment = 2 months  (3) status post right mastectomy and axillary lymph node dissection 12/01/2010, showing no residual invasive carcinoma in the breast, but residual tumor in 20 of 21 axillary lymph nodes. (Only one of the 20 positive lymph nodes showed a significant residual tumor, measuring 5 mm; the remaining lymph nodes, including the evidence of extracapsular extension, showed single and clusters a few neoplastic cells with extensive therapy related changes).  (4) postmastectomy radiation to the right chest wall, axilla, and supraclavicular region completed 03/07/2011  (5) additional 2 months of trastuzumab given February through April 2013, again discontinued because of a drop in the ejection fraction and lateral S'  (6) on letrozole July 2012  to September 2013  (7) LEFT axillary adenopathy noted by Dr. Carolynne Edouard on exam July 2013, with biopsy of a left axillary node 04/17/2012 showing an invasive ductal carcinoma, estrogen receptor 91% positive, progesterone receptor 9% positive, with an MIB-1 of 45%, and no HER-2 amplification. This tumor is morphologically identical to her prior Right breast cancer.  (8) PET scan 04/03/2012 showed multiple enlarged left axillary and small left supraclavicular lymph nodes that are moderately hypermetabolic, with some hypermetabolic activity within the left axillary tail. No other hypermetabolic lymph nodes identified. There was no involvement of the right axilla, mediastinum or right chest wall and no extrathoracic metastases were demonstrated.  (8) on exemestane and everolimus September through December 2013  (9) s/p RIGHT thoracentesis 08/01/2012, cytologically positive; s/p temporary Pleurx; effusion resolved after Abraxane therapy  (10) Abraxane started 08/16/2012, given day 1 and day 8 of each 21 day cycle, completed 6 cycles (12 doses) 12/06/2012, with excellent response  (11) fulvestrant started 12/19/2012  (12) trastuzumab resumed 12/21/2011. We scheduled the doses 4 weeks apart to see if they can be better tolerated. However echo 02/04/2013 again showed a drop in the EF and S', so Herceptin stopped (last dose 01/17/2013)  PLAN:  Macaria is tolerating this regimen very well, and we'll continue to receive fulvestrant monthly, with monthly port flushes. Her next appointment is scheduled for August 14.  She is due for a left mammogram and ultrasound which we helpful in following the left-sided disease. We will repeat her chest x-ray again in late September prior to seeing Dr. Darnelle Catalan for followup on September 30. She'll also have her repeat echocardiogram prior to that appointment, and when he sees her in late September, they'll discuss the possibility of initiating lapatinib.   In the meanwhile,  she'll continue following up with  the lymphedema clinic for her the right upper extremity, and I have refilled her oxycodone/APAP which she takes appropriately for pain.  All this was reviewed in detail with Britta Mccreedy, and she voices understanding and agreement with this plan. She will call with any changes part to her next appointment.   Sharine Cadle    03/28/2013

## 2013-03-29 ENCOUNTER — Ambulatory Visit: Payer: PRIVATE HEALTH INSURANCE | Attending: Oncology

## 2013-03-29 DIAGNOSIS — I890 Lymphedema, not elsewhere classified: Secondary | ICD-10-CM | POA: Insufficient documentation

## 2013-03-29 DIAGNOSIS — M24519 Contracture, unspecified shoulder: Secondary | ICD-10-CM | POA: Insufficient documentation

## 2013-03-29 DIAGNOSIS — M25619 Stiffness of unspecified shoulder, not elsewhere classified: Secondary | ICD-10-CM | POA: Insufficient documentation

## 2013-03-29 DIAGNOSIS — IMO0001 Reserved for inherently not codable concepts without codable children: Secondary | ICD-10-CM | POA: Insufficient documentation

## 2013-04-02 ENCOUNTER — Ambulatory Visit: Payer: PRIVATE HEALTH INSURANCE

## 2013-04-05 ENCOUNTER — Ambulatory Visit: Payer: PRIVATE HEALTH INSURANCE

## 2013-04-08 ENCOUNTER — Ambulatory Visit: Payer: PRIVATE HEALTH INSURANCE | Admitting: Physical Therapy

## 2013-04-09 ENCOUNTER — Ambulatory Visit: Payer: PRIVATE HEALTH INSURANCE

## 2013-04-10 ENCOUNTER — Ambulatory Visit: Payer: PRIVATE HEALTH INSURANCE | Admitting: Physical Therapy

## 2013-04-11 ENCOUNTER — Ambulatory Visit: Payer: PRIVATE HEALTH INSURANCE

## 2013-04-11 ENCOUNTER — Ambulatory Visit (HOSPITAL_BASED_OUTPATIENT_CLINIC_OR_DEPARTMENT_OTHER): Payer: PRIVATE HEALTH INSURANCE

## 2013-04-11 ENCOUNTER — Other Ambulatory Visit (HOSPITAL_BASED_OUTPATIENT_CLINIC_OR_DEPARTMENT_OTHER): Payer: PRIVATE HEALTH INSURANCE | Admitting: Lab

## 2013-04-11 VITALS — BP 147/71 | HR 83 | Temp 98.1°F

## 2013-04-11 DIAGNOSIS — C50911 Malignant neoplasm of unspecified site of right female breast: Secondary | ICD-10-CM

## 2013-04-11 DIAGNOSIS — C50619 Malignant neoplasm of axillary tail of unspecified female breast: Secondary | ICD-10-CM

## 2013-04-11 DIAGNOSIS — Z5111 Encounter for antineoplastic chemotherapy: Secondary | ICD-10-CM

## 2013-04-11 DIAGNOSIS — C773 Secondary and unspecified malignant neoplasm of axilla and upper limb lymph nodes: Secondary | ICD-10-CM

## 2013-04-11 LAB — CBC WITH DIFFERENTIAL/PLATELET
Basophils Absolute: 0 10*3/uL (ref 0.0–0.1)
Eosinophils Absolute: 0.1 10*3/uL (ref 0.0–0.5)
HGB: 10.6 g/dL — ABNORMAL LOW (ref 11.6–15.9)
MCV: 92.3 fL (ref 79.5–101.0)
MONO#: 0.4 10*3/uL (ref 0.1–0.9)
MONO%: 10 % (ref 0.0–14.0)
NEUT#: 2.2 10*3/uL (ref 1.5–6.5)
Platelets: 192 10*3/uL (ref 145–400)
RDW: 13.2 % (ref 11.2–14.5)

## 2013-04-11 LAB — COMPREHENSIVE METABOLIC PANEL (CC13)
Albumin: 3.2 g/dL — ABNORMAL LOW (ref 3.5–5.0)
Alkaline Phosphatase: 74 U/L (ref 40–150)
BUN: 18.9 mg/dL (ref 7.0–26.0)
CO2: 27 mEq/L (ref 22–29)
Calcium: 9.6 mg/dL (ref 8.4–10.4)
Glucose: 116 mg/dl (ref 70–140)
Potassium: 4.7 mEq/L (ref 3.5–5.1)

## 2013-04-11 MED ORDER — HEPARIN SOD (PORK) LOCK FLUSH 100 UNIT/ML IV SOLN
500.0000 [IU] | Freq: Once | INTRAVENOUS | Status: AC
  Administered 2013-04-11: 500 [IU] via INTRAVENOUS
  Filled 2013-04-11: qty 5

## 2013-04-11 MED ORDER — FULVESTRANT 250 MG/5ML IM SOLN
500.0000 mg | INTRAMUSCULAR | Status: DC
  Administered 2013-04-11: 500 mg via INTRAMUSCULAR
  Filled 2013-04-11: qty 10

## 2013-04-11 MED ORDER — SODIUM CHLORIDE 0.9 % IJ SOLN
10.0000 mL | INTRAMUSCULAR | Status: DC | PRN
  Administered 2013-04-11: 10 mL via INTRAVENOUS
  Filled 2013-04-11: qty 10

## 2013-04-11 NOTE — Patient Instructions (Addendum)
Implanted Port Instructions  An implanted port is a central line that has a round shape and is placed under the skin. It is used for long-term IV (intravenous) access for:  · Medicine.  · Fluids.  · Liquid nutrition, such as TPN (total parenteral nutrition).  · Blood samples.  Ports can be placed:  · In the chest area just below the collarbone (this is the most common place.)  · In the arms.  · In the belly (abdomen) area.  · In the legs.  PARTS OF THE PORT  A port has 2 main parts:  · The reservoir. The reservoir is round, disc-shaped, and will be a small, raised area under your skin.  · The reservoir is the part where a needle is inserted (accessed) to either give medicines or to draw blood.  · The catheter. The catheter is a long, slender tube that extends from the reservoir. The catheter is placed into a large vein.  · Medicine that is inserted into the reservoir goes into the catheter and then into the vein.  INSERTION OF THE PORT  · The port is surgically placed in either an operating room or in a procedural area (interventional radiology).  · Medicine may be given to help you relax during the procedure.  · The skin where the port will be inserted is numbed (local anesthetic).  · 1 or 2 small cuts (incisions) will be made in the skin to insert the port.  · The port can be used after it has been inserted.  INCISION SITE CARE  · The incision site may have small adhesive strips on it. This helps keep the incision site closed. Sometimes, no adhesive strips are placed. Instead of adhesive strips, a special kind of surgical glue is used to keep the incision closed.  · If adhesive strips were placed on the incision sites, do not take them off. They will fall off on their own.  · The incision site may be sore for 1 to 2 days. Pain medicine can help.  · Do not get the incision site wet. Bathe or shower as directed by your caregiver.  · The incision site should heal in 5 to 7 days. A small scar may form after the  incision has healed.  ACCESSING THE PORT  Special steps must be taken to access the port:  · Before the port is accessed, a numbing cream can be placed on the skin. This helps numb the skin over the port site.  · A sterile technique is used to access the port.  · The port is accessed with a needle. Only "non-coring" port needles should be used to access the port. Once the port is accessed, a blood return should be checked. This helps ensure the port is in the vein and is not clogged (clotted).  · If your caregiver believes your port should remain accessed, a clear (transparent) bandage will be placed over the needle site. The bandage and needle will need to be changed every week or as directed by your caregiver.  · Keep the bandage covering the needle clean and dry. Do not get it wet. Follow your caregiver's instructions on how to take a shower or bath when the port is accessed.  · If your port does not need to stay accessed, no bandage is needed over the port.  FLUSHING THE PORT  Flushing the port keeps it from getting clogged. How often the port is flushed depends on:  · If a   constant infusion is running. If a constant infusion is running, the port may not need to be flushed.  · If intermittent medicines are given.  · If the port is not being used.  For intermittent medicines:  · The port will need to be flushed:  · After medicines have been given.  · After blood has been drawn.  · As part of routine maintenance.  · A port is normally flushed with:  · Normal saline.  · Heparin.  · Follow your caregiver's advice on how often, how much, and the type of flush to use on your port.  IMPORTANT PORT INFORMATION  · Tell your caregiver if you are allergic to heparin.  · After your port is placed, you will get a manufacturer's information card. The card has information about your port. Keep this card with you at all times.  · There are many types of ports available. Know what kind of port you have.  · In case of an  emergency, it may be helpful to wear a medical alert bracelet. This can help alert health care workers that you have a port.  · The port can stay in for as long as your caregiver believes it is necessary.  · When it is time for the port to come out, surgery will be done to remove it. The surgery will be similar to how the port was put in.  · If you are in the hospital or clinic:  · Your port will be taken care of and flushed by a nurse.  · If you are at home:  · A home health care nurse may give medicines and take care of the port.  · You or a family member can get special training and directions for giving medicine and taking care of the port at home.  SEEK IMMEDIATE MEDICAL CARE IF:   · Your port does not flush or you are unable to get a blood return.  · New drainage or pus is coming from the incision.  · A bad smell is coming from the incision site.  · You develop swelling or increased redness at the incision site.  · You develop increased swelling or pain at the port site.  · You develop swelling or pain in the surrounding skin near the port.  · You have an oral temperature above 102° F (38.9° C), not controlled by medicine.  MAKE SURE YOU:   · Understand these instructions.  · Will watch your condition.  · Will get help right away if you are not doing well or get worse.  Document Released: 08/15/2005 Document Revised: 11/07/2011 Document Reviewed: 11/06/2008  [employer]® Patient Information ©2014 [employer].

## 2013-04-12 ENCOUNTER — Ambulatory Visit: Payer: PRIVATE HEALTH INSURANCE

## 2013-04-12 ENCOUNTER — Encounter (INDEPENDENT_AMBULATORY_CARE_PROVIDER_SITE_OTHER): Payer: Self-pay | Admitting: General Surgery

## 2013-04-12 ENCOUNTER — Ambulatory Visit
Admission: RE | Admit: 2013-04-12 | Discharge: 2013-04-12 | Disposition: A | Discharge status: Home / Self Care | Payer: PRIVATE HEALTH INSURANCE | Source: Ambulatory Visit | Attending: Physician Assistant | Admitting: Physician Assistant

## 2013-04-12 ENCOUNTER — Ambulatory Visit (INDEPENDENT_AMBULATORY_CARE_PROVIDER_SITE_OTHER): Payer: PRIVATE HEALTH INSURANCE | Admitting: General Surgery

## 2013-04-12 VITALS — BP 132/80 | HR 88 | Resp 14 | Ht 65.0 in | Wt 235.0 lb

## 2013-04-12 DIAGNOSIS — C50911 Malignant neoplasm of unspecified site of right female breast: Secondary | ICD-10-CM

## 2013-04-12 DIAGNOSIS — C50912 Malignant neoplasm of unspecified site of left female breast: Secondary | ICD-10-CM

## 2013-04-12 DIAGNOSIS — C50919 Malignant neoplasm of unspecified site of unspecified female breast: Secondary | ICD-10-CM

## 2013-04-12 NOTE — Patient Instructions (Signed)
Will talk with Dr. Darnelle Catalan about possible mastectomy for local control

## 2013-04-12 NOTE — Progress Notes (Signed)
Patient ID: Jeanette Morris, female   DOB: Jan 06, 1946, 67 y.o.   MRN: [id number]  Chief Complaint  Patient presents with  . New Evaluation    est. pt. new prob. eval skin bx    HPI Jeanette Vanbuskirk is a 67 y.o. female.  The patient is a 66 year old black female who was initially diagnosed with right-sided breast cancer in 2011. She underwent neoadjuvant chemotherapy and had a right Modified radical mastectomy in 2012. She subsequently developed a mass in the left axilla and was found to have a left-sided breast cancer with positive lymph nodes and evidence of metastatic disease to her supraclavicular lymph nodes and pleural space. She is being treated with chemotherapy and has been tolerating it reasonably well. She went for a mammogram of her left side this morning to follow her disease and at that point was found to have progression of her disease with some skin changes in the left axilla. HPI  Past Medical History  Diagnosis Date  . Hypertension   . Hearing loss     right ear  . Hyperlipidemia   . Arthritis   . 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 Breast cancer     (Rt) breast ca dx 9/11  . Dysrhythmia     A fib with IV chemo treatments  . Shortness of breath     Past Surgical History  Procedure Laterality Date  . Tubal ligation    . Portacath placement    . Abdominal hysterectomy    . Lung biopsy    . Lesion excision      L anterior thigh  . Breast surgery  12/01/10    ER+,PR+,Ki 67 64%,Her2 -; right breast lumpectomy  . Chest tube insertion  08/08/2012    Procedure: INSERTION PLEURAL DRAINAGE CATHETER;  Surgeon: Alleen Borne, MD;  Location: [facility] OR;  Service: Thoracic;  Laterality: Right;  . Removal of pleural drainage catheter Right 10/10/2012    Procedure: REMOVAL OF PLEURAL DRAINAGE CATHETER;  Surgeon: Alleen Borne, MD;  Location: [facility] OR;  Service: Thoracic;  Laterality: Right;    History reviewed. No pertinent family history.  Social History History   Substance Use Topics  . Smoking status: Former Smoker    Quit date: 06/30/1973  . Smokeless tobacco: Never Used  . Alcohol Use: No    No Known Allergies  Current Outpatient Prescriptions  Medication Sig Dispense Refill  . aspirin 81 MG tablet Take 81 mg by mouth daily.      . Calcium Carbonate-Vitamin D 600-400 MG-UNIT per tablet Take 1 tablet by mouth every morning.       . carvedilol (COREG) 12.5 MG tablet Take 1 tablet (12.5 mg total) by mouth 2 (two) times daily with a meal. Take With 6.25 mg tablet to make a total strength of 18.75mg       . cyanocobalamin 500 MCG tablet Take 500 mcg by mouth daily.      . ferrous sulfate 325 (65 FE) MG tablet Take 1 tablet (325 mg total) by mouth daily with breakfast.  30 tablet  1  . gabapentin (NEURONTIN) 300 MG capsule Take 300 mg by mouth at bedtime.       Marland Kitchen oxyCODONE-acetaminophen (PERCOCET/ROXICET) 5-325 MG per tablet Take 1-2 tablets by mouth every 8 (eight) hours as needed.  90 tablet  0  . spironolactone (ALDACTONE) 25 MG tablet Take 1 tablet (25 mg total) by mouth daily.  30 tablet  6  No current facility-administered medications for this visit.   Facility-Administered Medications Ordered in Other Visits  Medication Dose Route Frequency Provider Last Rate Last Dose  . fulvestrant (FASLODEX) injection 500 mg  500 mg Intramuscular Q30 days Lowella Dell, MD   500 mg at 02/14/13 1100    Review of Systems Review of Systems  Constitutional: Negative.   HENT: Negative.   Eyes: Negative.   Respiratory: Negative.   Cardiovascular: Negative.   Gastrointestinal: Negative.   Endocrine: Negative.   Genitourinary: Negative.   Musculoskeletal: Positive for joint swelling.  Skin: Negative.   Allergic/Immunologic: Negative.   Neurological: Negative.   Hematological: Negative.   Psychiatric/Behavioral: Negative.     Blood pressure 132/80, pulse 88, resp. rate 14, height 5\' 5"  (1.651 m), weight 235 lb (106.595 kg).  Physical  Exam Physical Exam  Constitutional: She is oriented to person, place, and time. She appears well-developed and well-nourished.  HENT:  Head: Normocephalic and atraumatic.  Eyes: Conjunctivae and EOM are normal. Pupils are equal, round, and reactive to light.  Neck: Normal range of motion. Neck supple.  Cardiovascular: Normal rate, regular rhythm and normal heart sounds.   Pulmonary/Chest: Effort normal and breath sounds normal.  There is a palpable fullness and mass in the left breast as well as palpable lymph nodes in the left axilla that have progressed since her last visit. There are skin changes overlying the axillary lymph nodes  Abdominal: Soft. Bowel sounds are normal. There is no tenderness.  Musculoskeletal: Normal range of motion.  There is swelling from lymphedema in her right arm which is stable  Neurological: She is alert and oriented to person, place, and time.  Skin: Skin is warm and dry.  Psychiatric: She has a normal mood and affect. Her behavior is normal.    Data Reviewed As above  Assessment    The patient has metastatic breast cancer with progression of the disease locally in her left breast and axilla. Because of this she may require a left mastectomy and node dissection simply for local control reasons so that the tumor does not eat through the skin area and I will discuss her situation with Dr. Darnelle Catalan and if he agrees we will plan for left modified radical mastectomy     Plan    Plan for left modified radical mastectomy if her oncologist is also in agreement with this. This would be for local control only. The patient understands that we cannot cure her disease at this point.        TOTH III,Shaleka Brines S 04/12/2013, 3:34 PM

## 2013-04-14 ENCOUNTER — Other Ambulatory Visit: Payer: Self-pay | Admitting: Oncology

## 2013-04-14 DIAGNOSIS — C50919 Malignant neoplasm of unspecified site of unspecified female breast: Secondary | ICD-10-CM

## 2013-04-15 ENCOUNTER — Ambulatory Visit: Payer: PRIVATE HEALTH INSURANCE | Admitting: Physical Therapy

## 2013-04-16 ENCOUNTER — Other Ambulatory Visit: Payer: Self-pay | Admitting: Oncology

## 2013-04-16 ENCOUNTER — Telehealth: Payer: Self-pay | Admitting: Oncology

## 2013-04-17 ENCOUNTER — Ambulatory Visit: Payer: PRIVATE HEALTH INSURANCE

## 2013-04-18 ENCOUNTER — Ambulatory Visit (HOSPITAL_COMMUNITY)
Admission: RE | Admit: 2013-04-18 | Discharge: 2013-04-18 | Disposition: A | Discharge status: Home / Self Care | Payer: PRIVATE HEALTH INSURANCE | Source: Ambulatory Visit | Attending: Oncology | Admitting: Oncology

## 2013-04-18 ENCOUNTER — Other Ambulatory Visit: Payer: Self-pay | Admitting: Emergency Medicine

## 2013-04-18 DIAGNOSIS — C50919 Malignant neoplasm of unspecified site of unspecified female breast: Secondary | ICD-10-CM | POA: Insufficient documentation

## 2013-04-18 DIAGNOSIS — R9389 Abnormal findings on diagnostic imaging of other specified body structures: Secondary | ICD-10-CM | POA: Insufficient documentation

## 2013-04-18 DIAGNOSIS — Z09 Encounter for follow-up examination after completed treatment for conditions other than malignant neoplasm: Secondary | ICD-10-CM

## 2013-04-18 DIAGNOSIS — I10 Essential (primary) hypertension: Secondary | ICD-10-CM | POA: Insufficient documentation

## 2013-04-18 NOTE — Progress Notes (Signed)
  Echocardiogram 2D Echocardiogram has been performed.  Jeanette Morris 04/18/2013, 11:08 AM

## 2013-04-19 ENCOUNTER — Ambulatory Visit: Payer: PRIVATE HEALTH INSURANCE

## 2013-04-22 ENCOUNTER — Ambulatory Visit: Payer: PRIVATE HEALTH INSURANCE | Admitting: Physical Therapy

## 2013-04-23 ENCOUNTER — Telehealth: Payer: Self-pay | Admitting: Oncology

## 2013-04-23 ENCOUNTER — Ambulatory Visit (HOSPITAL_BASED_OUTPATIENT_CLINIC_OR_DEPARTMENT_OTHER): Payer: PRIVATE HEALTH INSURANCE | Admitting: Oncology

## 2013-04-23 ENCOUNTER — Encounter: Payer: PRIVATE HEALTH INSURANCE | Admitting: Physician Assistant

## 2013-04-23 VITALS — BP 171/96 | HR 89 | Temp 98.2°F | Resp 20 | Ht 65.0 in | Wt 235.1 lb

## 2013-04-23 DIAGNOSIS — C50912 Malignant neoplasm of unspecified site of left female breast: Secondary | ICD-10-CM

## 2013-04-23 DIAGNOSIS — C50619 Malignant neoplasm of axillary tail of unspecified female breast: Secondary | ICD-10-CM

## 2013-04-23 DIAGNOSIS — C773 Secondary and unspecified malignant neoplasm of axilla and upper limb lymph nodes: Secondary | ICD-10-CM

## 2013-04-23 DIAGNOSIS — Z170 Estrogen receptor positive status [ER+]: Secondary | ICD-10-CM

## 2013-04-23 DIAGNOSIS — C50911 Malignant neoplasm of unspecified site of right female breast: Secondary | ICD-10-CM

## 2013-04-23 NOTE — Progress Notes (Signed)
This patient was evaluated by Dr. Darnelle Catalan today. Please see his office note dictated separately.  Zollie Scale, PA-C 04/23/2013

## 2013-04-24 ENCOUNTER — Ambulatory Visit: Payer: PRIVATE HEALTH INSURANCE

## 2013-04-26 ENCOUNTER — Ambulatory Visit: Payer: PRIVATE HEALTH INSURANCE

## 2013-04-27 ENCOUNTER — Other Ambulatory Visit (INDEPENDENT_AMBULATORY_CARE_PROVIDER_SITE_OTHER): Payer: Self-pay | Admitting: General Surgery

## 2013-04-27 NOTE — Progress Notes (Signed)
ID: Jeanette Morris   DOB: 07/12/1946  MR#: [id number]  CSN#:[id number]  PCP: Jeanette Garibaldi, NP GYN:  Jeanette Morris OTHER MD: Jeanette Morris, Jeanette Morris, Jeanette Morris   HISTORY OF PRESENT ILLNESS: The patient undergoes annual screening mammography.  Her mammograms previously had fullness.  In August 2011 she presented with some firmness of her right breast. She also noted some skin thickening around her nipple and some tenderness.  She was subsequently seen for this on 05/13/2010 and was referred for bilateral mammogram and right breast ultrasound.  There was concern that this may be mastitis.  There were diffuse skin thickening, tissue edema, enlarged lymph node in right axilla, some subareolar distortion secondary to the previous surgery.  She was given a course of doxycycline for 10 days and asked to return. A follow-up ultrasound of the breast showed persistent abnormalities.  She was referred for biopsy.  She underwent biopsy of the right axilla on 05/27/2010 and it showed metastatic carcinoma consistent with breast primary.  This was ER and PR positive, HER-2 was positive with a ratio of 3.09.  The ER ratio is 34%, PR was 64%, proliferative index 60%.  She subsequently had a MRI scan of both breasts on 06/03/2010 and this showed a area of minimal enhancement in the right breast measuring 10.0 x 6.0 x 3.0 cm.  Enlarged lymph nodes were seen in the right axilla.  Largest node measuring 3.0 x 1.0 x 7.0 cm.  Intramammary lymph nodes were also seen in the outer left breast, the largest measuring 2.8 x 2.5 cm.  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 The patient's subsequent history is as detailed below.  INTERVAL HISTORY: Jeanette Morris returns today accompanied by her husband Jeanette Morris for followup of her metastatic breast cancer. Since her last visit here, the patient was found by Jeanette Morris to have local progression of disease. She has  "thought and paid" regarding this and is planning to undergo left modified radical mastectomy in the near future.  REVIEW OF SYSTEMS: Jeanette Morris is doing well except for anxiety and concern regarding her disease progression. She denies any headaches, visual changes, nausea, vomiting, dizziness, or gait imbalance. There has been no cough, phlegm production, or pleurisy. A detailed review of systems today was otherwise stable   PAST MEDICAL HISTORY: Past Medical History  Diagnosis Date  . Hypertension   . Hearing loss     right ear  . Hyperlipidemia   . Arthritis   . 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 Breast cancer     (Rt) breast ca dx 9/11  . Dysrhythmia     A fib with IV chemo treatments  . Shortness of breath     PAST SURGICAL HISTORY: Past Surgical History  Procedure Laterality Date  . Tubal ligation    . Portacath placement    . Abdominal hysterectomy    . Lung biopsy    . Lesion excision      L anterior thigh  . Breast surgery  12/01/10    ER+,PR+,Ki 67 64%,Her2 -; right breast lumpectomy  . Chest tube insertion  08/08/2012    Procedure: INSERTION PLEURAL DRAINAGE CATHETER;  Surgeon: Jeanette Borne, MD;  Location: [facility] OR;  Service: Thoracic;  Laterality: Right;  . Removal of pleural drainage catheter Right 10/10/2012    Procedure: REMOVAL OF PLEURAL DRAINAGE CATHETER;  Surgeon: Jeanette Borne, MD;  Location: [facility] OR;  Service: Thoracic;  Laterality: Right;    FAMILY HISTORY No family history on file. The patient's father died in an automobile accident at age 53. The patient's mother died at age 94 with Alzheimer's disease. The patient had 8 brothers and 4 sisters. There is no history of breast or ovarian cancer in the family.  GYNECOLOGIC HISTORY: Menarche age 19, first live birth age 30. She is GX P7. She underwent menopause "more than 20 years ago". She never took hormone replacement.  SOCIAL HISTORY: She has been mostly a housewife. Her husband Jeanette Morris used to  be a cook at [employer]. He completed radiation therapy for prostate cancer under Jeanette Morris April of 2014. The patient's children are from an earlier marriage, and include Jeanette Morris, Jeanette Morris, [address], [address], Jeanette Morris, Jeanette Morris, and [employer]. The patient has 15 grandchildren and 1 great-grandchild. She at tends Jeanette Morris. [employer]  ADVANCED DIRECTIVES: Not in place  HEALTH MAINTENANCE: History  Substance Use Topics  . Smoking status: Former Smoker    Quit date: 06/30/1973  . Smokeless tobacco: Never Used  . Alcohol Use: No     Colonoscopy:  PAP:  Bone density:  Lipid panel:  No Known Allergies  Current Outpatient Prescriptions  Medication Sig Dispense Refill  . aspirin 81 MG tablet Take 81 mg by mouth daily.      . Calcium Carbonate-Vitamin D 600-400 MG-UNIT per tablet Take 1 tablet by mouth every morning.       . carvedilol (COREG) 12.5 MG tablet Take 1 tablet (12.5 mg total) by mouth 2 (two) times daily with a meal. Take With 6.25 mg tablet to make a total strength of 18.75mg       . cyanocobalamin 500 MCG tablet Take 500 mcg by mouth daily.      . ferrous sulfate 325 (65 FE) MG tablet Take 1 tablet (325 mg total) by mouth daily with breakfast.  30 tablet  1  . gabapentin (NEURONTIN) 300 MG capsule Take 300 mg by mouth at bedtime.       Marland Kitchen oxyCODONE-acetaminophen (PERCOCET/ROXICET) 5-325 MG per tablet Take 1-2 tablets by mouth every 8 (eight) hours as needed.  90 tablet  0  . spironolactone (ALDACTONE) 25 MG tablet Take 1 tablet (25 mg total) by mouth daily.  30 tablet  6   No current facility-administered medications for this visit.   Facility-Administered Medications Ordered in Other Visits  Medication Dose Route Frequency Provider Last Rate Last Dose  . fulvestrant (FASLODEX) injection 500 mg  500 mg Intramuscular Q30 days Jeanette Dell, MD   500 mg at 02/14/13 1100    OBJECTIVE: Elderly African American woman in no  acute distress There were no vitals filed for this visit. There were no vitals filed for this visit.    There is no weight on file to calculate BMI.    ECOG FS: 2 Vitals - 1 value per visit 04/23/2013  SYSTOLIC 171  DIASTOLIC 96  Pulse 89  Temperature 98.2  Respirations 20  Weight (lb) 235.1  Height 5\' 5"   BMI 39.12  VISIT REPORT    Sclerae unicteric Oropharynx clear No cervical or supraclavicular adenopathy  Lungs no rales or rhonchi; clear excursion bilaterally Heart regular rate and rhythm,  Abdomen  obese, soft, nontender, positive bowel sounds  MSK no focal spinal tenderness Right upper extremity lymphedema, stable,  Neuro: nonfocal, well oriented, positive affect Breasts: The patient is status post right mastectomy with  no evidence of local recurrence. Left breast shows increased in duration and erythema. I do not palpate a mass in her left axilla. Port is intact the left upper chest wall, no evidence of erythema or edema.    LAB RESULTS: Lab Results  Component Value Date   WBC 4.2 04/11/2013   NEUTROABS 2.2 04/11/2013   HGB 10.6* 04/11/2013   HCT 33.5* 04/11/2013   MCV 92.3 04/11/2013   PLT 192 04/11/2013      Chemistry      Component Value Date/Time   NA 140 04/11/2013 0915   NA 139 08/08/2012 0658   K 4.7 04/11/2013 0915   K 3.6 08/08/2012 0658   CL 106 02/14/2013 1048   CL 103 08/08/2012 0658   CO2 27 04/11/2013 0915   CO2 28 08/08/2012 0658   BUN 18.9 04/11/2013 0915   BUN 15 08/08/2012 0658   CREATININE 1.2* 04/11/2013 0915   CREATININE 1.56* 08/08/2012 0658      Component Value Date/Time   CALCIUM 9.6 04/11/2013 0915   CALCIUM 9.7 08/08/2012 0658   ALKPHOS 74 04/11/2013 0915   ALKPHOS 91 08/08/2012 0658   AST 21 04/11/2013 0915   AST 86* 08/08/2012 0658   ALT 12 04/11/2013 0915   ALT 117* 08/08/2012 0658   BILITOT 0.44 04/11/2013 0915   BILITOT 0.3 08/08/2012 0658       Lab Results  Component Value Date   LABCA2 95* 08/14/2012     STUDIES: ------------------------------------------------------------ Transthoracic Echocardiography  Patient: Jeanette Morris, Jeanette Morris MR #: [id number] Study Date: 04/18/2013 Gender: F Age: 2 Height: 165.1cm Weight: 106.8kg BSA: 2.7m^2 Pt. Status: Room:  PERFORMING [facility] Magrinat, [place] Duck SONOGRAPHER Cathie Beams REFERRING [name], Cala Bradford cc:  ------------------------------------------------------------ LV EF: 55% - 60%     Dg Chest 2 View  03/28/2013   *RADIOLOGY REPORT*  Clinical Data: Metastatic breast cancer.  Evaluate for response to therapy.  CHEST - 2 VIEW  Comparison: Chest x-ray 10/05/2012.  Findings: Postoperative changes of right-sided mastectomy and axillary lymph node dissection are again noted.  A left-sided single lumen Port-A-Cath with tip terminating in the proximal superior vena cava. Lung volumes are normal.  No consolidative airspace disease.  No pleural effusions.  No pneumothorax.  No pulmonary nodule or mass noted.  Pulmonary vasculature and the cardiomediastinal silhouette are within normal limits.   A large rim calcified structure in the right upper quadrant of the abdomen.  IMPRESSION: 1. No radiographic evidence of acute cardiopulmonary disease. 2.  Postoperative changes and support apparatus, as above. 3.  Densely calcified structure projecting over the right upper quadrant of the abdomen likely represents a large gallstone or potential porcelain gallbladder as suggested on prior CT examinations.   Original Report Authenticated By: Trudie Reed, M.D.   US Breast Left  04/12/2013   *RADIOLOGY REPORT*  Clinical Data:  History of right lumpectomy with radiation treatment, followed by a right mastectomy for recurrence.  Patient has had biopsy of left axillary lymph node and August 2013, showing malignancy identical to right breast primary.  The patient is undergoing chemotherapy.  Approximately 1 month ago, the patient started  noticing thickening and hardness of the left breast.  She has some mild tenderness around the nipple.  She denies left upper extremity swelling or lower extremity swelling.  She does have right upper extremity lymphedema.  DIGITAL DIAGNOSTIC LEFT MAMMOGRAM WITH CAD AND LEFT BREAST ULTRASOUND:  Comparison:  03/28/2012 and earlier  Findings:  ACR Breast Density Category b:  There are scattered areas of fibroglandular density.  There has been marked interval change in the appearance of the left breast.  There is diffuse skin thickening, associated trabecular thickening.  Partially visualized left axillary lymph nodes are noted, not well evaluated mammographically.  No suspicious microcalcifications are identified.  Mammographic images were processed with CAD.  On physical exam, the entire left breast is abnormal with skin thickening.  Superficial nodularity is palpated in the upper-outer quadrant of the left breast.  The lateral portion of the left breast is also thickened.  The patient notes some tenderness near the nipple.  However, there is little if any erythema.  Ultrasound is performed, showing marked skin thickening throughout the left breast.  The palpable superficial nodularity in the upper- outer quadrant the left breast shows a superficial, irregular nodule measuring 0.9 x 0.8 x 0.6 cm in the 1 o'clock location, 20 cm from nipple.  Multiple markedly enlarged left axillary lymph nodes are identified, not palpable, likely because of their depth and marked edema.  Within the lateral portion of the left breast, there is irregular hypoechoic area without discrete margins.  IMPRESSION:  1.  Marked abnormality the left breast, suspicious for diffuse malignancy, possibly inflammatory.  Differential diagnosis also includes congestive heart failure.  However, the patient has no other supportive signs or symptoms of congestive failure. 2.  Tissue diagnosis is recommended.  Punch biopsy scan is felt to be the best  source of diagnosis.  If punch biopsy cannot be performed or is not diagnostic, I would consider ultrasound guided core biopsy of the hypoechoic region in the lateral portion of the left breast.  RECOMMENDATION: The patient is scheduled to see Jeanette Morris for surgical consultation this afternoon, 04/12/2013.  I have discussed the findings and recommendations with the patient. Results were also provided in writing at the conclusion of the visit.  If applicable, a reminder letter will be sent to the patient regarding the next appointment.  BI-RADS CATEGORY 5:  Highly suggestive of malignancy - appropriate action should be taken.   Original Report Authenticated By: Norva Pavlov, M.D.   Mm Digital Diagnostic Unilat L  04/12/2013   *RADIOLOGY REPORT*  Clinical Data:  History of right lumpectomy with radiation treatment, followed by a right mastectomy for recurrence.  Patient has had biopsy of left axillary lymph node and August 2013, showing malignancy identical to right breast primary.  The patient is undergoing chemotherapy.  Approximately 1 month ago, the patient started noticing thickening and hardness of the left breast.  She has some mild tenderness around the nipple.  She denies left upper extremity swelling or lower extremity swelling.  She does have right upper extremity lymphedema.  DIGITAL DIAGNOSTIC LEFT MAMMOGRAM WITH CAD AND LEFT BREAST ULTRASOUND:  Comparison:  03/28/2012 and earlier  Findings:  ACR Breast Density Category b:  There are scattered areas of fibroglandular density.  There has been marked interval change in the appearance of the left breast.  There is diffuse skin thickening, associated trabecular thickening.  Partially visualized left axillary lymph nodes are noted, not well evaluated mammographically.  No suspicious microcalcifications are identified.  Mammographic images were processed with CAD.  On physical exam, the entire left breast is abnormal with skin thickening.  Superficial  nodularity is palpated in the upper-outer quadrant of the left breast.  The lateral portion of the left breast is also thickened.  The patient notes some tenderness near the nipple.  However, there is little if any erythema.  Ultrasound  is performed, showing marked skin thickening throughout the left breast.  The palpable superficial nodularity in the upper- outer quadrant the left breast shows a superficial, irregular nodule measuring 0.9 x 0.8 x 0.6 cm in the 1 o'clock location, 20 cm from nipple.  Multiple markedly enlarged left axillary lymph nodes are identified, not palpable, likely because of their depth and marked edema.  Within the lateral portion of the left breast, there is irregular hypoechoic area without discrete margins.  IMPRESSION:  1.  Marked abnormality the left breast, suspicious for diffuse malignancy, possibly inflammatory.  Differential diagnosis also includes congestive heart failure.  However, the patient has no other supportive signs or symptoms of congestive failure. 2.  Tissue diagnosis is recommended.  Punch biopsy scan is felt to be the best source of diagnosis.  If punch biopsy cannot be performed or is not diagnostic, I would consider ultrasound guided core biopsy of the hypoechoic region in the lateral portion of the left breast.  RECOMMENDATION: The patient is scheduled to see Jeanette Morris for surgical consultation this afternoon, 04/12/2013.  I have discussed the findings and recommendations with the patient. Results were also provided in writing at the conclusion of the visit.  If applicable, a reminder letter will be sent to the patient regarding the next appointment.  BI-RADS CATEGORY 5:  Highly suggestive of malignancy - appropriate action should be taken.   Original Report Authenticated By: Norva Pavlov, M.D.   ASSESSMENT: 67 y.o. [place] woman with stage 4 breast cancer  (1) status post RIGHT axillary lymph node biopsy 05/27/2010 for a clinical T4 N1, stage IIIB  (inflammatory) invasive ductal carcinoma, grade not stated, estrogen receptor 94% positive, progesterone receptor 64% positive, with an MIB-1 of 60%, and HER-2 amplification by FISH with a ratio of 7.5.  (2) neoadjuvant chemotherapy consisted of 6 cycles of standard carboplatin, docetaxel, trastuzumab, with the trastuzumab held cycles 5 and 6 due to a drop in her borderline ejection fraction (from 45-50% at baseline to 40-45% January 2012)-- total trastuzumab treatment = 2 months  (3) status post right mastectomy and axillary lymph node dissection 12/01/2010, showing no residual invasive carcinoma in the breast, but residual tumor in 20 of 21 axillary lymph nodes. (Only one of the 20 positive lymph nodes showed a significant residual tumor, measuring 5 mm; the remaining lymph nodes, including the evidence of extracapsular extension, showed single and clusters a few neoplastic cells with extensive therapy related changes).  (4) postmastectomy radiation to the right chest wall, axilla, and supraclavicular region completed 03/07/2011  (5) additional 2 months of trastuzumab given February through April 2013, again discontinued because of a drop in the ejection fraction and lateral S'  (6) on letrozole July 2012 to September 2013  (7) LEFT axillary adenopathy noted by Jeanette Morris on exam July 2013, with biopsy of a left axillary node 04/17/2012 showing an invasive ductal carcinoma, estrogen receptor 91% positive, progesterone receptor 9% positive, with an MIB-1 of 45%, and no HER-2 amplification. This tumor is morphologically identical to her prior Right breast cancer.  (8) PET scan 04/03/2012 showed multiple enlarged left axillary and small left supraclavicular lymph nodes that are moderately hypermetabolic, with some hypermetabolic activity within the left axillary tail. No other hypermetabolic lymph nodes identified. There was no involvement of the right axilla, mediastinum or right chest wall and no  extrathoracic metastases were demonstrated.  (8) on exemestane and everolimus September through December 2013  (9) s/p RIGHT thoracentesis 08/01/2012, cytologically positive; s/p temporary Pleurx; effusion  resolved after Abraxane therapy  (10) Abraxane started 08/16/2012, given day 1 and day 8 of each 21 day cycle, completed 6 cycles (12 doses) 12/06/2012, with excellent response  (11) fulvestrant started 12/19/2012  (12) trastuzumab resumed 12/21/2011. We scheduled the doses 4 weeks apart to see if they can be better tolerated. However echo 02/04/2013 again showed a drop in the EF and S', so Herceptin stopped (last dose 01/17/2013). Most recent echo, 04/18/2013, shows the ejection fraction to have recovered.  PLAN:  Brynja's cancer is progressing through her fulvestrant treatment, and we will need to go back to some form of chemotherapy. First step is for local control and she will have a left modified radical mastectomy under Jeanette Morris as soon as practicable. She is going to see me late September. At that time we will consider lapatinib/ capecitabine, or other combination of chemotherapy and anti-HER-2 treatment. She knows to call for any problems that may develop before her next visit here.  MAGRINAT,GUSTAV C    04/27/2013

## 2013-04-30 ENCOUNTER — Telehealth: Payer: Self-pay | Admitting: Oncology

## 2013-05-01 ENCOUNTER — Ambulatory Visit: Payer: PRIVATE HEALTH INSURANCE | Attending: Oncology

## 2013-05-01 ENCOUNTER — Encounter (HOSPITAL_COMMUNITY): Payer: Self-pay | Admitting: Pharmacy Technician

## 2013-05-01 DIAGNOSIS — M25619 Stiffness of unspecified shoulder, not elsewhere classified: Secondary | ICD-10-CM | POA: Insufficient documentation

## 2013-05-01 DIAGNOSIS — I890 Lymphedema, not elsewhere classified: Secondary | ICD-10-CM | POA: Insufficient documentation

## 2013-05-01 DIAGNOSIS — M24519 Contracture, unspecified shoulder: Secondary | ICD-10-CM | POA: Insufficient documentation

## 2013-05-01 DIAGNOSIS — IMO0001 Reserved for inherently not codable concepts without codable children: Secondary | ICD-10-CM | POA: Insufficient documentation

## 2013-05-02 ENCOUNTER — Ambulatory Visit (INDEPENDENT_AMBULATORY_CARE_PROVIDER_SITE_OTHER): Payer: PRIVATE HEALTH INSURANCE | Admitting: General Surgery

## 2013-05-02 ENCOUNTER — Encounter (INDEPENDENT_AMBULATORY_CARE_PROVIDER_SITE_OTHER): Payer: Self-pay | Admitting: General Surgery

## 2013-05-02 VITALS — BP 126/78 | HR 84 | Resp 18 | Ht 69.0 in | Wt 234.0 lb

## 2013-05-02 DIAGNOSIS — C50912 Malignant neoplasm of unspecified site of left female breast: Secondary | ICD-10-CM

## 2013-05-02 DIAGNOSIS — C50919 Malignant neoplasm of unspecified site of unspecified female breast: Secondary | ICD-10-CM

## 2013-05-02 NOTE — Progress Notes (Signed)
Patient ID: Jeanette Morris, female   DOB: 07/11/1946, 66 y.o.   MRN: [id number]  Chief Complaint  Patient presents with  . Follow-up    discuss surgery    HPI Jeanette Morris is a 66 y.o. female.  The patient is a 66-year-old black female with known metastatic breast cancer. She has had progression of the disease in her left breast on chemotherapy. Because of this the oncologists have recommended that she go ahead with surgery to have a left mastectomy and axillary lymph node dissection for local control reasons. The patient is here to discuss the surgery. She has had a previous right mastectomy for breast cancer as well.  HPI  Past Medical History  Diagnosis Date  . Hypertension   . Hearing loss     right ear  . Hyperlipidemia   . Arthritis   . 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 Breast cancer     (Rt) breast ca dx 9/11  . Dysrhythmia     A fib with IV chemo treatments  . Shortness of breath     Past Surgical History  Procedure Laterality Date  . Tubal ligation    . Portacath placement    . Abdominal hysterectomy    . Lung biopsy    . Lesion excision      L anterior thigh  . Breast surgery  12/01/10    ER+,PR+,Ki 67 64%,Her2 -; right breast lumpectomy  . Chest tube insertion  08/08/2012    Procedure: INSERTION PLEURAL DRAINAGE CATHETER;  Surgeon: Bryan K Bartle, MD;  Location: [facility] OR;  Service: Thoracic;  Laterality: Right;  . Removal of pleural drainage catheter Right 10/10/2012    Procedure: REMOVAL OF PLEURAL DRAINAGE CATHETER;  Surgeon: Bryan K Bartle, MD;  Location: [facility] OR;  Service: Thoracic;  Laterality: Right;    History reviewed. No pertinent family history.  Social History History  Substance Use Topics  . Smoking status: Former Smoker    Quit date: 06/30/1973  . Smokeless tobacco: Never Used  . Alcohol Use: No    No Known Allergies  Current Outpatient Prescriptions  Medication Sig Dispense Refill  . aspirin 81 MG tablet Take 81 mg by mouth  daily.      . Calcium Carbonate-Vitamin D 600-400 MG-UNIT per tablet Take 1 tablet by mouth every morning.       . carvedilol (COREG) 12.5 MG tablet Take 1 tablet (12.5 mg total) by mouth 2 (two) times daily with a meal. Take With 6.25 mg tablet to make a total strength of 18.75mg      . carvedilol (COREG) 6.25 MG tablet Take 6.25 mg by mouth daily. Takes with a 12.5 mg dose to total 18.75 mg daily      . cyanocobalamin 500 MCG tablet Take 500 mcg by mouth daily.      . ferrous sulfate 325 (65 FE) MG tablet Take 1 tablet (325 mg total) by mouth daily with breakfast.  30 tablet  1  . gabapentin (NEURONTIN) 300 MG capsule Take 300 mg by mouth at bedtime.       . oxyCODONE-acetaminophen (PERCOCET/ROXICET) 5-325 MG per tablet Take 1-2 tablets by mouth every 8 (eight) hours as needed.  90 tablet  0  . spironolactone (ALDACTONE) 25 MG tablet Take 1 tablet (25 mg total) by mouth daily.  30 tablet  6   No current facility-administered medications for this visit.    Review of Systems Review of Systems  Constitutional:   Negative.   HENT: Negative.   Eyes: Negative.   Respiratory: Negative.   Cardiovascular: Negative.   Gastrointestinal: Negative.   Endocrine: Negative.   Genitourinary: Negative.   Musculoskeletal: Negative.   Skin: Negative.   Allergic/Immunologic: Negative.   Neurological: Negative.   Hematological: Negative.   Psychiatric/Behavioral: Negative.     Blood pressure 126/78, pulse 84, resp. rate 18, height 5' 9" (1.753 m), weight 234 lb (106.142 kg).  Physical Exam Physical Exam  Constitutional: She is oriented to person, place, and time. She appears well-developed and well-nourished.  HENT:  Head: Normocephalic and atraumatic.  Eyes: Conjunctivae and EOM are normal. Pupils are equal, round, and reactive to light.  Neck: Normal range of motion. Neck supple.  Cardiovascular: Normal rate, regular rhythm and normal heart sounds.   Pulmonary/Chest: Effort normal and breath  sounds normal.  There is a large tumor burden in the left breast with peau d'orange changes and large palpable left axillary lymph nodes  Abdominal: Soft. Bowel sounds are normal. There is no tenderness.  Musculoskeletal: Normal range of motion.  Neurological: She is alert and oriented to person, place, and time.  Skin: Skin is warm and dry.  Psychiatric: She has a normal mood and affect. Her behavior is normal.    Data Reviewed As above  Assessment    The patient has progressive metastatic breast cancer. Because the disease in the left breast and axilla has progressed on chemotherapy the recommendation is for left modified radical mastectomy for local control reasons. I have discussed with her in detail the risks and benefits of the operation as well as some of the technical aspects and she understands and wishes to proceed     Plan    Plan for left modified radical mastectomy next Wednesday        TOTH III,PAUL S 05/02/2013, 4:17 PM    

## 2013-05-02 NOTE — Patient Instructions (Signed)
Plan for left modified radical mastectomy

## 2013-05-03 ENCOUNTER — Other Ambulatory Visit: Payer: Self-pay | Admitting: *Deleted

## 2013-05-03 ENCOUNTER — Ambulatory Visit: Payer: PRIVATE HEALTH INSURANCE | Admitting: Physical Therapy

## 2013-05-06 ENCOUNTER — Ambulatory Visit (HOSPITAL_COMMUNITY)
Admission: RE | Admit: 2013-05-06 | Discharge: 2013-05-06 | Disposition: A | Discharge status: Home / Self Care | Payer: PRIVATE HEALTH INSURANCE | Source: Ambulatory Visit | Attending: Internal Medicine | Admitting: Internal Medicine

## 2013-05-06 ENCOUNTER — Ambulatory Visit: Payer: PRIVATE HEALTH INSURANCE

## 2013-05-06 ENCOUNTER — Ambulatory Visit (HOSPITAL_COMMUNITY): Payer: PRIVATE HEALTH INSURANCE

## 2013-05-06 DIAGNOSIS — Z5189 Encounter for other specified aftercare: Secondary | ICD-10-CM

## 2013-05-06 DIAGNOSIS — Z79899 Other long term (current) drug therapy: Secondary | ICD-10-CM | POA: Insufficient documentation

## 2013-05-06 DIAGNOSIS — E785 Hyperlipidemia, unspecified: Secondary | ICD-10-CM | POA: Insufficient documentation

## 2013-05-06 DIAGNOSIS — I428 Other cardiomyopathies: Secondary | ICD-10-CM | POA: Insufficient documentation

## 2013-05-06 DIAGNOSIS — M129 Arthropathy, unspecified: Secondary | ICD-10-CM | POA: Insufficient documentation

## 2013-05-06 DIAGNOSIS — C50912 Malignant neoplasm of unspecified site of left female breast: Secondary | ICD-10-CM

## 2013-05-06 DIAGNOSIS — Z9010 Acquired absence of unspecified breast and nipple: Secondary | ICD-10-CM | POA: Insufficient documentation

## 2013-05-06 DIAGNOSIS — I10 Essential (primary) hypertension: Secondary | ICD-10-CM | POA: Insufficient documentation

## 2013-05-06 DIAGNOSIS — H9190 Unspecified hearing loss, unspecified ear: Secondary | ICD-10-CM | POA: Insufficient documentation

## 2013-05-06 DIAGNOSIS — C50919 Malignant neoplasm of unspecified site of unspecified female breast: Secondary | ICD-10-CM

## 2013-05-06 DIAGNOSIS — E669 Obesity, unspecified: Secondary | ICD-10-CM | POA: Insufficient documentation

## 2013-05-06 DIAGNOSIS — Z85820 Personal history of malignant melanoma of skin: Secondary | ICD-10-CM | POA: Insufficient documentation

## 2013-05-06 NOTE — Addendum Note (Signed)
Encounter addended by: Noralee Space, RN on: 05/06/2013  9:00 AM<BR>     Documentation filed: Orders

## 2013-05-06 NOTE — Patient Instructions (Addendum)
We will contact you in 6 months to schedule your next appointment and echocardiogram  

## 2013-05-06 NOTE — Progress Notes (Signed)
Patient ID: Jeanette Morris, female   DOB: [birth year], 67 y.o.   MRN: [id number] HPI:  Jeanette Morris is a 67 y/o woman with h/o HTN and obesity. Denies any known h/o CAD or HF.  She was diagnosed with R Breast CA in 9/11. Underwent neoadjuvant chemo with (TCH regimen) followed by carboplatin and Taxotere x 6 cycles. She is status post rt mastectomy in April 2012 LN+. ER/PR/NER 2-neu +. Post-op had XRT. [name] would wanted to start her on Herceptin. However, she had echocardiogram on 01/06/11 which was read as EF 45-50% and so referred for cardiology clearance.  Her echos were been reviewed, dating back to 2003. EF has remained in 40-50% range without significant change. Lateral S' velocity also were unchanged around 10.5 cm/sec - although some echos with poor windows.  With her EF running on the low end of the normal range for almost a decade and the importance of the Herceptin therapy, we cleared her for Herceptin therapy starting in September 2012 with concomitant therapy with ACE-I and b-blocker. However, there was concern that EF was drifting down and Herceptin was stopped in January 2014.  She was restarted on Herceptin in 4/14 and had 2 doses but repeat echo in 6/14 showed fall in EF and lateral S', so Herceptin was again stopped, this time likely permanently.   Echos: 05/03/11: 50-55%, lateral s' velocity around 11.5 cm/sec.   09/22/11: 50-55%, lateral s' 10.5-11 but poor window 12/21/11: 45-50% LVIDd 5.0 cm (up from 4.4) Lat s' 6.7cm/sec (poor windows) 02/02/12: 40-45%%   7.1 02/23/12 Cardiac MRI: EF 38%. No scarring 05/28/12 EF 45% lateral S' 10.1 07/23/12 EF50-55%  Lateral S' 11.2 10/22/12: EF ~50-55% lat s ' 10.1 12/17/12: EF 60% lat s' 12.8 02/04/13: EF 40-45% Lat s'  8.1.  8/14: EF 55-60%, lateral s' 13.6  She returns for follow up today.  She has been off Herceptin. Denies SOB/PND/Orthopnea. She is not on and Ace due to angioedema but is on Coreg.  I reviewed echo from 8/14 today.  EF actually appears  to be in the normal range now and lateral s' has increased up to 13.6.  She will be going on Wednesday for a left mastectomy and lymph node dissection due to progression of disease despite chemo.   Labs (8/14): K 4.7, creatinine 1.2 . ROS: All other systems normal except as mentioned in HPI, past medical history and problem list.    Past Medical History  Diagnosis Date  . Hypertension   . Hearing loss     right ear  . Hyperlipidemia   . Arthritis   . 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 Breast cancer     (Rt) breast ca dx 9/11  . Dysrhythmia     A fib with IV chemo treatments  . Shortness of breath     Current Outpatient Prescriptions  Medication Sig Dispense Refill  . Calcium Carbonate-Vitamin D 600-400 MG-UNIT per tablet Take 1 tablet by mouth every morning.       . carvedilol (COREG) 12.5 MG tablet Take 1 tablet (12.5 mg total) by mouth 2 (two) times daily with a meal. Take With 6.25 mg tablet to make a total strength of 18.75mg       . carvedilol (COREG) 6.25 MG tablet Take 6.25 mg by mouth daily. Takes with a 12.5 mg dose to total 18.75 mg daily      . cyanocobalamin 500 MCG tablet Take 500 mcg by mouth daily.      Marland Kitchen  ferrous sulfate 325 (65 FE) MG tablet Take 1 tablet (325 mg total) by mouth daily with breakfast.  30 tablet  1  . gabapentin (NEURONTIN) 300 MG capsule Take 300 mg by mouth at bedtime.       Marland Kitchen spironolactone (ALDACTONE) 25 MG tablet Take 1 tablet (25 mg total) by mouth daily.  30 tablet  6   No current facility-administered medications for this encounter.     No Known Allergies   PHYSICAL EXAM: Filed Vitals:   05/06/13 0914  BP: 116/68  Pulse: 74  Weight: 239 lb 4 oz (108.523 kg)  SpO2: 97%   General:  Obese. Chronically ill appearing.  No respiratory difficulty  HEENT: normal Neck: supple. no JVD. Carotids 2+ bilat; no bruits. No lymphadenopathy or thryomegaly appreciated. S/p R mastectomy Cor: PMI nondisplaced. Regular rate & rhythm.  No rubs, gallops or murmurs. Lungs: clear bilaterally Abdomen: obese soft, nontender, nondistended. No hepatosplenomegaly. No bruits or masses. Good bowel sounds. Extremities: no cyanosis, clubbing, rash, edema.  Rt arm lymphedema Neuro: alert & oriented x 3, cranial nerves grossly intact. moves all 4 extremities w/o difficulty. Affect pleasant.    ASSESSMENT & PLAN: 1. Cardiomyopathy:  Herceptin-related.  Off Herceptin, EF is now back up to normal range and lateral s' is back up also.  Doing well today symptomatically and not volume overloaded.  Can continue current doses of Coreg and spironolactone.  Will need at least q3 month BMETs on spironolactone.  As mentioned in prior note, would avoid future Herceptin use.  2. Pre-operative evaluation: Patient to have surgery Wednesday (left mastectomy).  No cardiac contraindication, continue Coreg peri-operatively.   Marca Ancona 05/06/2013 9:50 AM

## 2013-05-07 ENCOUNTER — Encounter (HOSPITAL_COMMUNITY): Payer: Self-pay | Admitting: *Deleted

## 2013-05-07 MED ORDER — CEFAZOLIN SODIUM-DEXTROSE 2-3 GM-% IV SOLR
2.0000 g | INTRAVENOUS | Status: AC
  Administered 2013-05-08: 2 g via INTRAVENOUS
  Filled 2013-05-07: qty 50

## 2013-05-08 ENCOUNTER — Encounter (HOSPITAL_COMMUNITY): Payer: Self-pay | Admitting: *Deleted

## 2013-05-08 ENCOUNTER — Encounter (HOSPITAL_COMMUNITY): Payer: Self-pay | Admitting: Certified Registered"

## 2013-05-08 ENCOUNTER — Ambulatory Visit (HOSPITAL_COMMUNITY): Payer: PRIVATE HEALTH INSURANCE | Admitting: Certified Registered"

## 2013-05-08 ENCOUNTER — Ambulatory Visit (HOSPITAL_COMMUNITY)
Admission: RE | Admit: 2013-05-08 | Discharge: 2013-05-09 | Disposition: A | Discharge status: Home / Self Care | Payer: PRIVATE HEALTH INSURANCE | Source: Ambulatory Visit | Attending: General Surgery | Admitting: General Surgery

## 2013-05-08 ENCOUNTER — Encounter (HOSPITAL_COMMUNITY)
Admission: RE | Disposition: A | Discharge status: Home / Self Care | Payer: Self-pay | Source: Ambulatory Visit | Attending: General Surgery

## 2013-05-08 DIAGNOSIS — C50912 Malignant neoplasm of unspecified site of left female breast: Secondary | ICD-10-CM

## 2013-05-08 DIAGNOSIS — Z9221 Personal history of antineoplastic chemotherapy: Secondary | ICD-10-CM | POA: Insufficient documentation

## 2013-05-08 DIAGNOSIS — I10 Essential (primary) hypertension: Secondary | ICD-10-CM | POA: Insufficient documentation

## 2013-05-08 DIAGNOSIS — C773 Secondary and unspecified malignant neoplasm of axilla and upper limb lymph nodes: Secondary | ICD-10-CM | POA: Insufficient documentation

## 2013-05-08 DIAGNOSIS — C50919 Malignant neoplasm of unspecified site of unspecified female breast: Secondary | ICD-10-CM | POA: Insufficient documentation

## 2013-05-08 DIAGNOSIS — Z9010 Acquired absence of unspecified breast and nipple: Secondary | ICD-10-CM | POA: Insufficient documentation

## 2013-05-08 DIAGNOSIS — Z79899 Other long term (current) drug therapy: Secondary | ICD-10-CM | POA: Insufficient documentation

## 2013-05-08 DIAGNOSIS — D0590 Unspecified type of carcinoma in situ of unspecified breast: Secondary | ICD-10-CM | POA: Insufficient documentation

## 2013-05-08 HISTORY — DX: Tinnitus, right ear: H93.11

## 2013-05-08 HISTORY — PX: MASTECTOMY MODIFIED RADICAL: SHX5962

## 2013-05-08 HISTORY — DX: Polyneuropathy, unspecified: G62.9

## 2013-05-08 HISTORY — PX: MASTECTOMY, RADICAL: SHX710

## 2013-05-08 HISTORY — DX: Respiratory tuberculosis unspecified: A15.9

## 2013-05-08 LAB — BASIC METABOLIC PANEL
BUN: 18 mg/dL (ref 6–23)
Calcium: 9.1 mg/dL (ref 8.4–10.5)
Creatinine, Ser: 1.02 mg/dL (ref 0.50–1.10)
GFR calc Af Amer: 64 mL/min — ABNORMAL LOW (ref >90)
GFR calc non Af Amer: 55 mL/min — ABNORMAL LOW (ref >90)
Potassium: 4.4 mEq/L (ref 3.5–5.1)

## 2013-05-08 LAB — CBC
MCHC: 33.3 g/dL (ref 30.0–36.0)
Platelets: 185 10*3/uL (ref 150–400)
RDW: 13.2 % (ref 11.5–15.5)

## 2013-05-08 SURGERY — MASTECTOMY, MODIFIED RADICAL
Anesthesia: General | Site: Breast | Laterality: Left | Wound class: Clean

## 2013-05-08 MED ORDER — CARVEDILOL 12.5 MG PO TABS
12.5000 mg | ORAL_TABLET | Freq: Two times a day (BID) | ORAL | Status: DC
  Administered 2013-05-08 – 2013-05-09 (×2): 12.5 mg via ORAL
  Filled 2013-05-08 (×3): qty 1

## 2013-05-08 MED ORDER — CALCIUM CARBONATE ANTACID 500 MG PO CHEW
1.0000 | CHEWABLE_TABLET | Freq: Every day | ORAL | Status: DC
  Administered 2013-05-09: 200 mg via ORAL
  Filled 2013-05-08: qty 1

## 2013-05-08 MED ORDER — LIDOCAINE HCL (CARDIAC) 20 MG/ML IV SOLN
INTRAVENOUS | Status: DC | PRN
  Administered 2013-05-08: 100 mg via INTRAVENOUS

## 2013-05-08 MED ORDER — FENTANYL CITRATE 0.05 MG/ML IJ SOLN
INTRAMUSCULAR | Status: DC | PRN
  Administered 2013-05-08 (×2): 25 ug via INTRAVENOUS
  Administered 2013-05-08 (×2): 50 ug via INTRAVENOUS

## 2013-05-08 MED ORDER — SPIRONOLACTONE 25 MG PO TABS
25.0000 mg | ORAL_TABLET | Freq: Every day | ORAL | Status: DC
  Administered 2013-05-08 – 2013-05-09 (×2): 25 mg via ORAL
  Filled 2013-05-08 (×2): qty 1

## 2013-05-08 MED ORDER — WHITE PETROLATUM GEL
Status: AC
  Administered 2013-05-08: 18:00:00
  Filled 2013-05-08: qty 5

## 2013-05-08 MED ORDER — CALCIUM CARBONATE-VITAMIN D 600-400 MG-UNIT PO TABS: 1.0000 | ORAL_TABLET | Freq: Every morning | ORAL | Status: DC

## 2013-05-08 MED ORDER — KCL IN DEXTROSE-NACL 20-5-0.9 MEQ/L-%-% IV SOLN
INTRAVENOUS | Status: DC
  Administered 2013-05-08 – 2013-05-09 (×2): via INTRAVENOUS
  Filled 2013-05-08 (×3): qty 1000

## 2013-05-08 MED ORDER — OXYCODONE HCL 5 MG/5ML PO SOLN: 5.0000 mg | Freq: Once | ORAL | Status: DC | PRN

## 2013-05-08 MED ORDER — LACTATED RINGERS IV SOLN
INTRAVENOUS | Status: DC | PRN
  Administered 2013-05-08 (×2): via INTRAVENOUS

## 2013-05-08 MED ORDER — ONDANSETRON HCL 4 MG/2ML IJ SOLN
INTRAMUSCULAR | Status: DC | PRN
  Administered 2013-05-08: 4 mg via INTRAVENOUS

## 2013-05-08 MED ORDER — GABAPENTIN 300 MG PO CAPS
300.0000 mg | ORAL_CAPSULE | Freq: Every day | ORAL | Status: DC
  Administered 2013-05-08: 300 mg via ORAL
  Filled 2013-05-08 (×2): qty 1

## 2013-05-08 MED ORDER — CYANOCOBALAMIN 500 MCG PO TABS
500.0000 ug | ORAL_TABLET | Freq: Every day | ORAL | Status: DC
  Administered 2013-05-08 – 2013-05-09 (×2): 500 ug via ORAL
  Filled 2013-05-08 (×2): qty 1

## 2013-05-08 MED ORDER — ONDANSETRON HCL 4 MG PO TABS: 4.0000 mg | ORAL_TABLET | Freq: Four times a day (QID) | ORAL | Status: DC | PRN

## 2013-05-08 MED ORDER — OXYCODONE-ACETAMINOPHEN 5-325 MG PO TABS
1.0000 | ORAL_TABLET | ORAL | Status: DC | PRN
  Administered 2013-05-08 – 2013-05-09 (×3): 1 via ORAL
  Filled 2013-05-08 (×3): qty 1

## 2013-05-08 MED ORDER — INFLUENZA VAC SPLIT QUAD 0.5 ML IM SUSP
0.5000 mL | INTRAMUSCULAR | Status: AC
  Administered 2013-05-09: 0.5 mL via INTRAMUSCULAR
  Filled 2013-05-08: qty 0.5

## 2013-05-08 MED ORDER — PROPOFOL 10 MG/ML IV BOLUS
INTRAVENOUS | Status: DC | PRN
  Administered 2013-05-08: 80 mg via INTRAVENOUS

## 2013-05-08 MED ORDER — HYDROMORPHONE HCL PF 1 MG/ML IJ SOLN
INTRAMUSCULAR | Status: AC
  Filled 2013-05-08: qty 1

## 2013-05-08 MED ORDER — CHLORHEXIDINE GLUCONATE 4 % EX LIQD: 1.0000 "application " | Freq: Once | CUTANEOUS | Status: DC

## 2013-05-08 MED ORDER — MEPERIDINE HCL 25 MG/ML IJ SOLN
6.2500 mg | INTRAMUSCULAR | Status: DC | PRN
Start: 2013-05-08 — End: 2013-05-08

## 2013-05-08 MED ORDER — ONDANSETRON HCL 4 MG/2ML IJ SOLN: 4.0000 mg | Freq: Four times a day (QID) | INTRAMUSCULAR | Status: DC | PRN

## 2013-05-08 MED ORDER — MIDAZOLAM HCL 5 MG/5ML IJ SOLN
INTRAMUSCULAR | Status: DC | PRN
  Administered 2013-05-08: 2 mg via INTRAVENOUS

## 2013-05-08 MED ORDER — 0.9 % SODIUM CHLORIDE (POUR BTL) OPTIME
TOPICAL | Status: DC | PRN
  Administered 2013-05-08: 1000 mL

## 2013-05-08 MED ORDER — HYDROMORPHONE HCL PF 1 MG/ML IJ SOLN
0.2500 mg | INTRAMUSCULAR | Status: DC | PRN
  Administered 2013-05-08 (×2): 0.5 mg via INTRAVENOUS

## 2013-05-08 MED ORDER — ONDANSETRON HCL 4 MG/2ML IJ SOLN: 4.0000 mg | Freq: Once | INTRAMUSCULAR | Status: DC | PRN

## 2013-05-08 MED ORDER — GLYCOPYRROLATE 0.2 MG/ML IJ SOLN
INTRAMUSCULAR | Status: DC | PRN
  Administered 2013-05-08: 0.2 mg via INTRAVENOUS

## 2013-05-08 MED ORDER — MORPHINE SULFATE 4 MG/ML IJ SOLN
4.0000 mg | INTRAMUSCULAR | Status: DC | PRN
  Administered 2013-05-08: 4 mg via INTRAVENOUS
  Filled 2013-05-08: qty 1

## 2013-05-08 MED ORDER — CARVEDILOL 6.25 MG PO TABS
6.2500 mg | ORAL_TABLET | Freq: Every day | ORAL | Status: DC
  Administered 2013-05-09: 6.25 mg via ORAL
  Filled 2013-05-08: qty 1

## 2013-05-08 MED ORDER — OXYCODONE HCL 5 MG PO TABS: 5.0000 mg | ORAL_TABLET | Freq: Once | ORAL | Status: DC | PRN

## 2013-05-08 MED ORDER — FERROUS SULFATE 325 (65 FE) MG PO TABS
325.0000 mg | ORAL_TABLET | Freq: Every day | ORAL | Status: DC
  Administered 2013-05-09: 325 mg via ORAL
  Filled 2013-05-08: qty 1

## 2013-05-08 MED ORDER — CALCIUM CARBONATE-VITAMIN D 500-200 MG-UNIT PO TABS
1.0000 | ORAL_TABLET | Freq: Every morning | ORAL | Status: DC
  Administered 2013-05-09: 1 via ORAL
  Filled 2013-05-08: qty 1

## 2013-05-08 SURGICAL SUPPLY — 47 items
ADH SKN CLS APL DERMABOND .7 (GAUZE/BANDAGES/DRESSINGS) ×1
APPLIER CLIP 9.375 MED OPEN (MISCELLANEOUS) ×2
APR CLP MED 9.3 20 MLT OPN (MISCELLANEOUS) ×1
BINDER BREAST LRG (GAUZE/BANDAGES/DRESSINGS) IMPLANT
BINDER BREAST XLRG (GAUZE/BANDAGES/DRESSINGS) ×1 IMPLANT
CANISTER SUCTION 2500CC (MISCELLANEOUS) ×2 IMPLANT
CHLORAPREP W/TINT 26ML (MISCELLANEOUS) ×2 IMPLANT
CLIP APPLIE 9.375 MED OPEN (MISCELLANEOUS) ×1 IMPLANT
CLOTH BEACON ORANGE TIMEOUT ST (SAFETY) ×2 IMPLANT
COVER SURGICAL LIGHT HANDLE (MISCELLANEOUS) ×2 IMPLANT
DERMABOND ADVANCED (GAUZE/BANDAGES/DRESSINGS) ×1
DERMABOND ADVANCED .7 DNX12 (GAUZE/BANDAGES/DRESSINGS) ×1 IMPLANT
DRAIN CHANNEL 19F RND (DRAIN) ×4 IMPLANT
DRAPE LAPAROSCOPIC ABDOMINAL (DRAPES) ×2 IMPLANT
DRAPE UTILITY 15X26 W/TAPE STR (DRAPE) ×4 IMPLANT
DRSG PAD ABDOMINAL 8X10 ST (GAUZE/BANDAGES/DRESSINGS) ×2 IMPLANT
ELECT BLADE 4.0 EZ CLEAN MEGAD (MISCELLANEOUS) ×2
ELECT CAUTERY BLADE 6.4 (BLADE) ×2 IMPLANT
ELECT REM PT RETURN 9FT ADLT (ELECTROSURGICAL) ×2
ELECTRODE BLDE 4.0 EZ CLN MEGD (MISCELLANEOUS) IMPLANT
ELECTRODE REM PT RTRN 9FT ADLT (ELECTROSURGICAL) ×1 IMPLANT
EVACUATOR SILICONE 100CC (DRAIN) ×4 IMPLANT
GAUZE XEROFORM 5X9 LF (GAUZE/BANDAGES/DRESSINGS) ×1 IMPLANT
GLOVE BIO SURGEON STRL SZ7.5 (GLOVE) ×3 IMPLANT
GLOVE BIOGEL PI IND STRL 7.5 (GLOVE) IMPLANT
GLOVE BIOGEL PI INDICATOR 7.5 (GLOVE) ×1
GOWN STRL NON-REIN LRG LVL3 (GOWN DISPOSABLE) ×6 IMPLANT
KIT BASIN OR (CUSTOM PROCEDURE TRAY) ×2 IMPLANT
KIT ROOM TURNOVER OR (KITS) ×2 IMPLANT
NS IRRIG 1000ML POUR BTL (IV SOLUTION) ×2 IMPLANT
PACK GENERAL/GYN (CUSTOM PROCEDURE TRAY) ×2 IMPLANT
PAD ARMBOARD 7.5X6 YLW CONV (MISCELLANEOUS) ×4 IMPLANT
SPECIMEN JAR X LARGE (MISCELLANEOUS) IMPLANT
SPONGE GAUZE 4X4 12PLY (GAUZE/BANDAGES/DRESSINGS) ×2 IMPLANT
SPONGE LAP 18X18 X RAY DECT (DISPOSABLE) ×2 IMPLANT
STAPLER VISISTAT 35W (STAPLE) ×2 IMPLANT
SUT ETHILON 3 0 FSL (SUTURE) ×4 IMPLANT
SUT MON AB 4-0 PC3 18 (SUTURE) ×2 IMPLANT
SUT SILK 2 0 (SUTURE) ×2
SUT SILK 2-0 18XBRD TIE 12 (SUTURE) IMPLANT
SUT VIC AB 3-0 54X BRD REEL (SUTURE) IMPLANT
SUT VIC AB 3-0 BRD 54 (SUTURE)
SUT VIC AB 3-0 SH 18 (SUTURE) ×3 IMPLANT
TAPE CLOTH SURG 6X10 WHT LF (GAUZE/BANDAGES/DRESSINGS) ×1 IMPLANT
TOWEL OR 17X24 6PK STRL BLUE (TOWEL DISPOSABLE) ×2 IMPLANT
TOWEL OR 17X26 10 PK STRL BLUE (TOWEL DISPOSABLE) ×2 IMPLANT
WATER STERILE IRR 1000ML POUR (IV SOLUTION) IMPLANT

## 2013-05-08 NOTE — Op Note (Signed)
05/08/2013  12:55 PM  PATIENT:  Jeanette Morris  67 y.o. female  PRE-OPERATIVE DIAGNOSIS:  left breast cancer   POST-OPERATIVE DIAGNOSIS:  left breast cancer   PROCEDURE:  Procedure(s): MASTECTOMY MODIFIED RADICAL (Left)  SURGEON:  Surgeon(s) and Role:    * Robyne Askew, MD - Primary    * Wilmon Arms. Corliss Skains, MD - Assisting  PHYSICIAN ASSISTANT:   ASSISTANTS: Dr. Corliss Skains   ANESTHESIA:   general  EBL:  Total I/O In: 1000 [I.V.:1000] Out: -   BLOOD ADMINISTERED:none  DRAINS: (2) Jackson-Pratt drain(s) with closed bulb suction in the prepectoral space   LOCAL MEDICATIONS USED:  NONE  SPECIMEN:  Source of Specimen:  left breast and axillary lymph nodes  DISPOSITION OF SPECIMEN:  PATHOLOGY  COUNTS:  YES  TOURNIQUET:  * No tourniquets in log *  DICTATION: .Dragon Dictation After informed consent was obtained the patient was brought to the operating room placed in the supine position on the operating room table. After adequate induction of general anesthesia the patient's left chest, breast, and axilla were prepped with ChloraPrep, allowed to dry, and draped in usual sterile manner. The patient has a large tumor mass in the breast and axilla as well as evidence of metastatic disease. An elliptical incision was made around the palpable tumor with a 10 blade knife. The incision was carried through the skin and subcutaneous tissue sharply with the electrocautery. Breast hooks were used to elevate the skin flaps anteriorly towards the ceiling. Skin flaps were created between the subcutaneous fat and the breast tissue circumferentially until the dissection was carried all the way to the chest wall. Laterally the dissection was carried to the latissimus muscle. Once this was accomplished the breast was removed from the pectoralis muscle with the pectoralis fascia. This was also done sharply with the electrocautery. During this portion of the operation there was tumor noted to be invading  deeply through the pectoralis muscle. Once the dissection was carried to the lateral chest wall we were able to follow the tissue planes up into the axilla. We identified the axillary vein, the long thoracic nerve, and the thoracodorsal neurovascular complex. The tumor in the axilla was invading the axillary vein and chest wall under the pectoralis muscle. The tumor was wrapped around the direct dorsal neurovascular complex. We removed as much of the tumor within the boundaries of the axilla as we could without sacrificing the axillary vein or the thoracodorsal neurovascular complex. Unfortunately gross tumor was left behind. Once this was accomplished hemostasis was achieved using the Bovie electrocautery. The wound was irrigated with copious amounts of saline. 2 stab incisions were made in the anterior axillary line below the operative area. A tonsil clamp was placed through each of these incisions and used to bring a 19 [place] round Blake drain into the operative bed. The lateral drain was placed in the axilla. The medial drain was curled along the chest wall. The drains were anchored to the skin with a 3-0 nylon stitch. The skin flaps appeared to be viable. The superior and inferior flaps were grossly reapproximated with interrupted 3-0 Vicryl stitches. The skin was closed with staples. Sterile dressings were applied. The drains were placed to bulb suction and was a good seal. The patient tolerated the procedure well. At the end of the case all needle sponge and instrument counts were correct. The patient was then awakened and taken to recovery in stable condition.  PLAN OF CARE: Admit for overnight observation  PATIENT  DISPOSITION:  PACU - hemodynamically stable.   Delay start of Pharmacological VTE agent (>24hrs) due to surgical blood loss or risk of bleeding: yes

## 2013-05-08 NOTE — Interval H&P Note (Signed)
History and Physical Interval Note:  05/08/2013 10:33 AM  Jeanette Morris  has presented today for surgery, with the diagnosis of left breast cancer   The various methods of treatment have been discussed with the patient and family. After consideration of risks, benefits and other options for treatment, the patient has consented to  Procedure(s): MASTECTOMY MODIFIED RADICAL (Left) as a surgical intervention .  The patient's history has been reviewed, patient examined, no change in status, stable for surgery.  I have reviewed the patient's chart and labs.  Questions were answered to the patient's satisfaction.     TOTH III,Elizbeth Posa S

## 2013-05-08 NOTE — Anesthesia Preprocedure Evaluation (Addendum)
Anesthesia Evaluation  Patient identified by MRN, date of birth, ID band Patient awake    Reviewed: Allergy & Precautions, H&P , NPO status , Patient's Chart, lab work & pertinent test results  Airway Mallampati: I TM Distance: >3 FB Neck ROM: Full    Dental   Pulmonary shortness of breath,          Cardiovascular hypertension, Pt. on medications  Echos showed EF low normal followed by Dr Jearld Pies - see his note   Neuro/Psych    GI/Hepatic   Endo/Other    Renal/GU      Musculoskeletal   Abdominal   Peds  Hematology   Anesthesia Other Findings   Reproductive/Obstetrics                          Anesthesia Physical Anesthesia Plan  ASA: III  Anesthesia Plan: General   Post-op Pain Management:    Induction: Intravenous  Airway Management Planned: LMA  Additional Equipment:   Intra-op Plan:   Post-operative Plan: Extubation in OR  Informed Consent: I have reviewed the patients History and Physical, chart, labs and discussed the procedure including the risks, benefits and alternatives for the proposed anesthesia with the patient or authorized representative who has indicated his/her understanding and acceptance.     Plan Discussed with: CRNA and Surgeon  Anesthesia Plan Comments:         Anesthesia Quick Evaluation

## 2013-05-08 NOTE — Transfer of Care (Signed)
Immediate Anesthesia Transfer of Care Note  Patient: Jeanette Morris  Procedure(s) Performed: Procedure(s): MASTECTOMY MODIFIED RADICAL (Left)  Patient Location: PACU  Anesthesia Type:General  Level of Consciousness: awake, alert , oriented and patient cooperative  Airway & Oxygen Therapy: Patient Spontanous Breathing and Patient connected to nasal cannula oxygen  Post-op Assessment: Report given to PACU RN, Post -op Vital signs reviewed and stable and Patient moving all extremities  Post vital signs: Reviewed and stable  Complications: No apparent anesthesia complications

## 2013-05-08 NOTE — Anesthesia Procedure Notes (Signed)
Procedure Name: LMA Insertion Date/Time: 05/08/2013 11:12 AM Performed by: Jerilee Hoh Pre-anesthesia Checklist: Patient identified, Emergency Drugs available, Suction available and Patient being monitored Patient Re-evaluated:Patient Re-evaluated prior to inductionOxygen Delivery Method: Circle system utilized Preoxygenation: Pre-oxygenation with 100% oxygen Intubation Type: IV induction Ventilation: Mask ventilation without difficulty LMA: LMA inserted LMA Size: 4.0 Tube type: Oral Number of attempts: 1 Placement Confirmation: positive ETCO2 and breath sounds checked- equal and bilateral Tube secured with: Tape Dental Injury: Teeth and Oropharynx as per pre-operative assessment

## 2013-05-08 NOTE — H&P (View-Only) (Signed)
Patient ID: Jeanette Morris, female   DOB: 13-Jan-1946, 67 y.o.   MRN: [id number]  Chief Complaint  Patient presents with  . Follow-up    discuss surgery    HPI Jeanette Morris is a 67 y.o. female.  The patient is a 67 year old black female with known metastatic breast cancer. She has had progression of the disease in her left breast on chemotherapy. Because of this the oncologists have recommended that she go ahead with surgery to have a left mastectomy and axillary lymph node dissection for local control reasons. The patient is here to discuss the surgery. She has had a previous right mastectomy for breast cancer as well.  HPI  Past Medical History  Diagnosis Date  . Hypertension   . Hearing loss     right ear  . Hyperlipidemia   . Arthritis   . 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 Breast cancer     (Rt) breast ca dx 9/11  . Dysrhythmia     A fib with IV chemo treatments  . Shortness of breath     Past Surgical History  Procedure Laterality Date  . Tubal ligation    . Portacath placement    . Abdominal hysterectomy    . Lung biopsy    . Lesion excision      L anterior thigh  . Breast surgery  12/01/10    ER+,PR+,Ki 67 64%,Her2 -; right breast lumpectomy  . Chest tube insertion  08/08/2012    Procedure: INSERTION PLEURAL DRAINAGE CATHETER;  Surgeon: Alleen Borne, MD;  Location: [facility] OR;  Service: Thoracic;  Laterality: Right;  . Removal of pleural drainage catheter Right 10/10/2012    Procedure: REMOVAL OF PLEURAL DRAINAGE CATHETER;  Surgeon: Alleen Borne, MD;  Location: [facility] OR;  Service: Thoracic;  Laterality: Right;    History reviewed. No pertinent family history.  Social History History  Substance Use Topics  . Smoking status: Former Smoker    Quit date: 06/30/1973  . Smokeless tobacco: Never Used  . Alcohol Use: No    No Known Allergies  Current Outpatient Prescriptions  Medication Sig Dispense Refill  . aspirin 81 MG tablet Take 81 mg by mouth  daily.      . Calcium Carbonate-Vitamin D 600-400 MG-UNIT per tablet Take 1 tablet by mouth every morning.       . carvedilol (COREG) 12.5 MG tablet Take 1 tablet (12.5 mg total) by mouth 2 (two) times daily with a meal. Take With 6.25 mg tablet to make a total strength of 18.75mg       . carvedilol (COREG) 6.25 MG tablet Take 6.25 mg by mouth daily. Takes with a 12.5 mg dose to total 18.75 mg daily      . cyanocobalamin 500 MCG tablet Take 500 mcg by mouth daily.      . ferrous sulfate 325 (65 FE) MG tablet Take 1 tablet (325 mg total) by mouth daily with breakfast.  30 tablet  1  . gabapentin (NEURONTIN) 300 MG capsule Take 300 mg by mouth at bedtime.       Marland Kitchen oxyCODONE-acetaminophen (PERCOCET/ROXICET) 5-325 MG per tablet Take 1-2 tablets by mouth every 8 (eight) hours as needed.  90 tablet  0  . spironolactone (ALDACTONE) 25 MG tablet Take 1 tablet (25 mg total) by mouth daily.  30 tablet  6   No current facility-administered medications for this visit.    Review of Systems Review of Systems  Constitutional:  Negative.   HENT: Negative.   Eyes: Negative.   Respiratory: Negative.   Cardiovascular: Negative.   Gastrointestinal: Negative.   Endocrine: Negative.   Genitourinary: Negative.   Musculoskeletal: Negative.   Skin: Negative.   Allergic/Immunologic: Negative.   Neurological: Negative.   Hematological: Negative.   Psychiatric/Behavioral: Negative.     Blood pressure 126/78, pulse 84, resp. rate 18, height 5\' 9"  (1.753 m), weight 234 lb (106.142 kg).  Physical Exam Physical Exam  Constitutional: She is oriented to person, place, and time. She appears well-developed and well-nourished.  HENT:  Head: Normocephalic and atraumatic.  Eyes: Conjunctivae and EOM are normal. Pupils are equal, round, and reactive to light.  Neck: Normal range of motion. Neck supple.  Cardiovascular: Normal rate, regular rhythm and normal heart sounds.   Pulmonary/Chest: Effort normal and breath  sounds normal.  There is a large tumor burden in the left breast with peau d'orange changes and large palpable left axillary lymph nodes  Abdominal: Soft. Bowel sounds are normal. There is no tenderness.  Musculoskeletal: Normal range of motion.  Neurological: She is alert and oriented to person, place, and time.  Skin: Skin is warm and dry.  Psychiatric: She has a normal mood and affect. Her behavior is normal.    Data Reviewed As above  Assessment    The patient has progressive metastatic breast cancer. Because the disease in the left breast and axilla has progressed on chemotherapy the recommendation is for left modified radical mastectomy for local control reasons. I have discussed with her in detail the risks and benefits of the operation as well as some of the technical aspects and she understands and wishes to proceed     Plan    Plan for left modified radical mastectomy next Wednesday        TOTH III,PAUL S 05/02/2013, 4:17 PM

## 2013-05-08 NOTE — Progress Notes (Signed)
Spoke with joyce charge nurse on 6n bed still not clean

## 2013-05-08 NOTE — Anesthesia Postprocedure Evaluation (Signed)
Anesthesia Post Note  Patient: Jeanette Morris  Procedure(s) Performed: Procedure(s) (LRB): MASTECTOMY MODIFIED RADICAL (Left)  Anesthesia type: General  Patient location: PACU  Post pain: Pain level controlled  Post assessment: Patient's Cardiovascular Status Stable  Last Vitals:  Filed Vitals:   05/08/13 1415  BP: 162/72  Pulse: 71  Temp:   Resp: 11    Post vital signs: Reviewed and stable  Level of consciousness: alert  Complications: No apparent anesthesia complications

## 2013-05-08 NOTE — Preoperative (Signed)
Beta Blockers   Reason not to administer Beta Blockers:Not Applicable 

## 2013-05-08 NOTE — Progress Notes (Signed)
Pt placed on hold due to no clean bed in room

## 2013-05-09 ENCOUNTER — Ambulatory Visit: Payer: PRIVATE HEALTH INSURANCE

## 2013-05-09 ENCOUNTER — Other Ambulatory Visit: Payer: PRIVATE HEALTH INSURANCE | Admitting: Lab

## 2013-05-09 LAB — CBC
HCT: 26.8 % — ABNORMAL LOW (ref 36.0–46.0)
MCV: 91.8 fL (ref 78.0–100.0)
Platelets: 174 10*3/uL (ref 150–400)
RBC: 2.92 MIL/uL — ABNORMAL LOW (ref 3.87–5.11)
WBC: 4.4 10*3/uL (ref 4.0–10.5)

## 2013-05-09 LAB — BASIC METABOLIC PANEL
CO2: 26 mEq/L (ref 19–32)
Chloride: 103 mEq/L (ref 96–112)
Creatinine, Ser: 1.17 mg/dL — ABNORMAL HIGH (ref 0.50–1.10)
Potassium: 4.4 mEq/L (ref 3.5–5.1)

## 2013-05-09 NOTE — Progress Notes (Signed)
1 Day Post-Op  Subjective: Complains of some soreness but otherwise is doing ok  Objective: Vital signs in last 24 hours: Temp:  [97.6 F (36.4 C)-98.8 F (37.1 C)] 97.7 F (36.5 C) (09/11 0532) Pulse Rate:  [57-86] 76 (09/11 0532) Resp:  [11-25] 18 (09/11 0532) BP: (125-206)/(51-85) 128/53 mmHg (09/11 0532) SpO2:  [93 %-100 %] 97 % (09/11 0532) Last BM Date: 05/08/13  Intake/Output from previous day: 09/10 0701 - 09/11 0700 In: 2570 [I.V.:2500] Out: 695 [Urine:250; Drains:370; Blood:75] Intake/Output this shift:    Chest wall: skin flaps appear viable. drain output serosanguinous  Lab Results:   Recent Labs  05/08/13 0908  WBC 3.5*  HGB 10.1*  HCT 30.3*  PLT 185   BMET  Recent Labs  05/08/13 0908  NA 134*  K 4.4  CL 101  CO2 25  GLUCOSE 108*  BUN 18  CREATININE 1.02  CALCIUM 9.1   PT/INR No results found for this basename: LABPROT, INR,  in the last 72 hours ABG No results found for this basename: PHART, PCO2, PO2, HCO3,  in the last 72 hours  Studies/Results: No results found.  Anti-infectives: Anti-infectives   Start     Dose/Rate Route Frequency Ordered Stop   05/08/13 0600  ceFAZolin (ANCEF) IVPB 2 g/50 mL premix     2 g 100 mL/hr over 30 Minutes Intravenous On call to O.R. 05/07/13 1433 05/08/13 1119      Assessment/Plan: s/p Procedure(s): MASTECTOMY MODIFIED RADICAL (Left) Advance diet Discharge  LOS: 1 day    TOTH III,Omer Puccinelli S 05/09/2013

## 2013-05-09 NOTE — Discharge Summary (Signed)
Physician Discharge Summary  Patient ID: Jeanette Morris MRN: [id number] DOB/AGE: 02/07/1946 67 y.o.  Admit date: 05/08/2013 Discharge date: 05/09/2013  Admission Diagnoses:  Discharge Diagnoses:  Active Problems:   * No active hospital problems. *   Discharged Condition: good  Hospital Course: the pt underwent left modified radical mastectomy. She tolerated the surgery well. Unfortunately she had disease invading her chest wall that was not resectable. She feels well today and is ready to go home with her drains. She feels comfortable managing the drains  Consults: None  Significant Diagnostic Studies: none  Treatments: surgery: as above  Discharge Exam: Blood pressure 128/53, pulse 76, temperature 97.7 F (36.5 C), temperature source Oral, resp. rate 18, height 5\' 5"  (1.651 m), weight 239 lb (108.41 kg), SpO2 97.00%. Chest wall: skin flaps viable. drains intact  Disposition: 01-Home or Self Care  Discharge Orders   Future Appointments Provider Department Dept Phone   05/20/2013 8:45 AM Nonda Lou, PT OUTPATIENT CANCER [facility] [phone]   05/22/2013 8:45 AM Alphonzo Cruise, PTA [facility]IENT [facility] [phone]   05/24/2013 8:45 AM Oprc-Cr Sub Therapist 9 OUTPATIENT [facility]ER [facility] [phone]   05/27/2013 8:45 AM Alphonzo Cruise, PTA OUTPATIENT CAN[facility]ER [facility] [phone]   05/28/2013 9:00 AM Wl-Dg 4 (Chest[facility] [phone]   05/28/2013 9:30 AM Mauri [facility] MEDICAL ONCOLOGY [phone]   05/28/2013 10:00 AM Lowella Dell, MD [facility] MEDICAL ONCOLOGY [phone]   05/29/2013 8:45 AM Alphonzo Cruise, PTA OUTPATIENT CAN[facility]ER [facility] [phone]   05/31/2013 8:45 AM Oprc-Cr Sub Therapist 9 OUTPATIENT [facility]ER [facility] [phone]   06/10/2013 8:45 AM Dava Najjar [facility] MED[facility] ONCOL[facility] [phone]   06/10/2013 9:15 AM [name] Allegra Grana, PA-C [facility] MEDICAL ONCOLOGY [phone]   Future Orders Complete By Expires   Call MD for:  difficulty breathing, headache or visual disturbances  As directed    Call MD for:  extreme fatigue  As directed    Call MD for:  hives  As directed    Call MD for:  persistant dizziness or light-headedness  As directed    Call MD for:  persistant nausea and vomiting  As directed    Call MD for:  redness, tenderness, or signs of infection (pain, swelling, redness, odor or green/yellow discharge around incision site)  As directed    Call MD for:  severe uncontrolled pain  As directed    Call MD for:  temperature >100.4  As directed    Diet - low sodium heart healthy  As directed    Discharge instructions  As directed    Comments:     Sponge bathe while drains are in. Empty drains and record output twice a day then recharge bulbs   Increase activity slowly  As directed        Medication List         Calcium Carbonate-Vitamin D 600-400 MG-UNIT per tablet  Take 1 tablet by mouth every morning.     carvedilol 12.5 MG tablet  Commonly known as:  COREG  Take 1 tablet (12.5 mg total) by mouth 2 (two) times daily with a meal. Take With 6.25 mg tablet to make a total strength of 18.75mg      carvedilol 6.25 MG tablet  Commonly known as:  COREG  Take 6.25 mg by mouth daily. Takes with a 12.5 mg dose to total 18.75 mg daily  cyanocobalamin 500 MCG tablet  Take 500 mcg by mouth daily.     ferrous sulfate 325 (65 FE) MG tablet  Take 1 tablet (325 mg total) by mouth daily with breakfast.     gabapentin 300 MG capsule  Commonly known as:  NEURONTIN  Take 300 mg by mouth at bedtime.     oxyCODONE-acetaminophen 5-325 MG per tablet  Commonly known as:  PERCOCET/ROXICET  Take 1 tablet by mouth every 4 (four) hours as needed for pain.     spironolactone  25 MG tablet  Commonly known as:  ALDACTONE  Take 1 tablet (25 mg total) by mouth daily.           Follow-up Information   Follow up with Robyne Askew, MD In 1 week.   Specialty:  General Surgery   Contact information:   [address] [place] [address] [phone]       Signed: Robyne Askew 05/09/2013, 9:13 AM

## 2013-05-09 NOTE — Progress Notes (Signed)
Patient alert and oriented x4.  Pt given discharge instructions and prescription.  Pt denies any questions or concerns.  IV access removed, cannula in place no drainage, swelling, bleeding to site. Pt tolerated removal well.  Pt discharged per wheelchair with NT to home.

## 2013-05-10 ENCOUNTER — Encounter (HOSPITAL_COMMUNITY): Payer: Self-pay | Admitting: General Surgery

## 2013-05-14 ENCOUNTER — Encounter (INDEPENDENT_AMBULATORY_CARE_PROVIDER_SITE_OTHER): Payer: Self-pay | Admitting: General Surgery

## 2013-05-14 ENCOUNTER — Ambulatory Visit (INDEPENDENT_AMBULATORY_CARE_PROVIDER_SITE_OTHER): Payer: PRIVATE HEALTH INSURANCE | Admitting: General Surgery

## 2013-05-14 VITALS — BP 138/82 | HR 72 | Temp 98.9°F | Resp 14 | Ht 65.0 in | Wt 230.6 lb

## 2013-05-14 DIAGNOSIS — C50919 Malignant neoplasm of unspecified site of unspecified female breast: Secondary | ICD-10-CM

## 2013-05-14 DIAGNOSIS — C50912 Malignant neoplasm of unspecified site of left female breast: Secondary | ICD-10-CM

## 2013-05-14 NOTE — Patient Instructions (Signed)
Continue to record drain output 

## 2013-05-16 ENCOUNTER — Telehealth (INDEPENDENT_AMBULATORY_CARE_PROVIDER_SITE_OTHER): Payer: Self-pay

## 2013-05-16 ENCOUNTER — Encounter (INDEPENDENT_AMBULATORY_CARE_PROVIDER_SITE_OTHER): Payer: Self-pay

## 2013-05-16 NOTE — Telephone Encounter (Signed)
Patient called in stating she is to report to jury duty this Tuesday the same day she is to come in our office for drain check. Her husbad brought up the papers from the court house. I called the jury clerk at the court house and explained patient just had surgery and has wounds that need to be addressed on Tuesday. The clerk rescheduled her to Monday January 26. I let patient know her rescheduled jury date.

## 2013-05-20 ENCOUNTER — Ambulatory Visit: Payer: PRIVATE HEALTH INSURANCE | Admitting: Physical Therapy

## 2013-05-21 ENCOUNTER — Encounter (INDEPENDENT_AMBULATORY_CARE_PROVIDER_SITE_OTHER): Payer: Self-pay | Admitting: General Surgery

## 2013-05-21 ENCOUNTER — Ambulatory Visit (INDEPENDENT_AMBULATORY_CARE_PROVIDER_SITE_OTHER): Payer: PRIVATE HEALTH INSURANCE | Admitting: General Surgery

## 2013-05-21 VITALS — BP 138/72 | HR 72 | Temp 97.9°F | Resp 14 | Ht 65.0 in | Wt 234.0 lb

## 2013-05-21 DIAGNOSIS — C50912 Malignant neoplasm of unspecified site of left female breast: Secondary | ICD-10-CM

## 2013-05-21 DIAGNOSIS — C50919 Malignant neoplasm of unspecified site of unspecified female breast: Secondary | ICD-10-CM

## 2013-05-21 NOTE — Progress Notes (Signed)
Subjective:     Patient ID: Jeanette Morris, female   DOB: 1945/09/29, 67 y.o.   MRN: [id number]  HPI The patient is a 67 year old black female who is one-week status post left modified radical mastectomy for metastatic breast cancer. She complains of soreness but it seems to be manageable. Both of her drains are putting out a significant amount of serosanguineous fluid. Otherwise her appetite is good and her bowels are working normally.  Review of Systems     Objective:   Physical Exam On exam her left modified radical mastectomy incision is healing nicely with no sign of infection or significant seroma. Her skin flaps are healthy with no sign of necrosis. Her drains are intact    Assessment:     The patient is one-week status post left modified radical mastectomy     Plan:     At this point I will leave both of her drains in for another week. I will see her back in one week to check her progress. She will continue to wear her breast binder.

## 2013-05-21 NOTE — Patient Instructions (Signed)
Continue to record output from remaining drain 

## 2013-05-21 NOTE — Progress Notes (Signed)
Subjective:     Patient ID: Jeanette Morris, female   DOB: 1946/02/05, 67 y.o.   MRN: [id number]  HPI The patient is a 67 year old black female who is 2 weeks status post left modified radical mastectomy for metastatic breast cancer. She had disease along the chest wall and in the axilla but was not resectable. Her drains are putting out about 40 cc a day of serosanguineous fluid. She denies any pain.  Review of Systems     Objective:   Physical Exam On exam her left mastectomy incision is healing nicely with no sign of infection or significant seroma. Her skin flaps are healthy with no necrosis.    Assessment:     The patient is 2 weeks status post left modified radical mastectomy     Plan:     Today I removed the medial chest wall to drain without difficulty. She tolerated this well. I also removed about half of her staples. I will plan to see her back next week to check her progress

## 2013-05-22 ENCOUNTER — Encounter: Payer: PRIVATE HEALTH INSURANCE | Admitting: Physical Therapy

## 2013-05-28 ENCOUNTER — Telehealth: Payer: Self-pay | Admitting: *Deleted

## 2013-05-28 ENCOUNTER — Ambulatory Visit (HOSPITAL_COMMUNITY)
Admission: RE | Admit: 2013-05-28 | Discharge: 2013-05-28 | Disposition: A | Discharge status: Home / Self Care | Payer: PRIVATE HEALTH INSURANCE | Source: Ambulatory Visit | Attending: Physician Assistant | Admitting: Physician Assistant

## 2013-05-28 ENCOUNTER — Ambulatory Visit (HOSPITAL_BASED_OUTPATIENT_CLINIC_OR_DEPARTMENT_OTHER): Payer: PRIVATE HEALTH INSURANCE | Admitting: Oncology

## 2013-05-28 ENCOUNTER — Other Ambulatory Visit: Payer: PRIVATE HEALTH INSURANCE | Admitting: Lab

## 2013-05-28 ENCOUNTER — Other Ambulatory Visit: Payer: Self-pay | Admitting: Oncology

## 2013-05-28 VITALS — BP 169/92 | HR 101 | Temp 98.2°F | Resp 20 | Ht 65.0 in | Wt 235.2 lb

## 2013-05-28 DIAGNOSIS — J90 Pleural effusion, not elsewhere classified: Secondary | ICD-10-CM | POA: Insufficient documentation

## 2013-05-28 DIAGNOSIS — C50911 Malignant neoplasm of unspecified site of right female breast: Secondary | ICD-10-CM

## 2013-05-28 DIAGNOSIS — C50411 Malignant neoplasm of upper-outer quadrant of right female breast: Secondary | ICD-10-CM

## 2013-05-28 DIAGNOSIS — C773 Secondary and unspecified malignant neoplasm of axilla and upper limb lymph nodes: Secondary | ICD-10-CM

## 2013-05-28 DIAGNOSIS — Z9010 Acquired absence of unspecified breast and nipple: Secondary | ICD-10-CM | POA: Insufficient documentation

## 2013-05-28 DIAGNOSIS — C50919 Malignant neoplasm of unspecified site of unspecified female breast: Secondary | ICD-10-CM | POA: Insufficient documentation

## 2013-05-28 DIAGNOSIS — C50619 Malignant neoplasm of axillary tail of unspecified female breast: Secondary | ICD-10-CM

## 2013-05-28 DIAGNOSIS — Z170 Estrogen receptor positive status [ER+]: Secondary | ICD-10-CM

## 2013-05-28 MED ORDER — OXYCODONE-ACETAMINOPHEN 5-325 MG PO TABS
1.0000 | ORAL_TABLET | ORAL | Status: DC | PRN
Start: 2013-05-28 — End: 2013-06-17

## 2013-05-28 MED ORDER — TAMOXIFEN CITRATE 20 MG PO TABS: 20.0000 mg | ORAL_TABLET | Freq: Every day | ORAL | Status: DC

## 2013-05-28 NOTE — Progress Notes (Signed)
ID: Jeanette Morris   DOB: Aug 16, 1946  MR#: [id number]  CSN#:[id number]  PCP: Egbert Garibaldi, NP GYN:  SUFelicity Pellegrini OTHER MD: Arvilla Meres, Antony Blackbird, Rexanne Mano   HISTORY OF PRESENT ILLNESS: The patient undergoes annual screening mammography.  Her mammograms previously had fullness.  In August 2011 she presented with some firmness of her right breast. She also noted some skin thickening around her nipple and some tenderness.  She was subsequently seen for this on 05/13/2010 and was referred for bilateral mammogram and right breast ultrasound.  There was concern that this may be mastitis.  There were diffuse skin thickening, tissue edema, enlarged lymph node in right axilla, some subareolar distortion secondary to the previous surgery.  She was given a course of doxycycline for 10 days and asked to return. A follow-up ultrasound of the breast showed persistent abnormalities.  She was referred for biopsy.  She underwent biopsy of the right axilla on 05/27/2010 and it showed metastatic carcinoma consistent with breast primary.  This was ER and PR positive, HER-2 was positive with a ratio of 3.09.  The ER ratio is 34%, PR was 64%, proliferative index 60%.  She subsequently had a MRI scan of both breasts on 06/03/2010 and this showed a area of minimal enhancement in the right breast measuring 10.0 x 6.0 x 3.0 cm.  Enlarged lymph nodes were seen in the right axilla.  Largest node measuring 3.0 x 1.0 x 7.0 cm.  Intramammary lymph nodes were also seen in the outer left breast, the largest measuring 2.8 x 2.5 cm.  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 The patient's subsequent history is as detailed below.  INTERVAL HISTORY: Jeanette Morris returns today accompanied by her husband Jeanette Morris for followup of her metastatic breast cancer. Since her last visit here, she underwent left modified radical mastectomy (05/08/2013). This found an invasive  lobular breast cancer measuring 15 cm. It involved the skin, including the dermal lymphatics, and all 6 of this 6 axillary lymph nodes sampled. The invasive carcinoma was broadly present at the posterior margin. Dr. Carolynne Edouard saw the patient with me today to possibly remove the drain, but it is borderline in his opinion and so the drain will stay at least another week.  REVIEW OF SYSTEMS: Jeanette Morris is still having significant pain from the surgery, and is taking Percocet every 4-6 hours. This is not constipating her. He does control the pain pretty well. She is not having any bleeding or fever. She does have some arthritis symptoms here in there, but are not more persistent or intense than usual. She denies any headaches, nausea, vomiting, visual changes, or gait imbalance. She denies cough phlegm production or pleurisy. She denies chest pain or pressure. A detailed review systems today was otherwise stable.   PAST MEDICAL HISTORY: Past Medical History  Diagnosis Date  . Hypertension   . Hearing loss     right ear  . Hyperlipidemia   . Arthritis   . Dysrhythmia     A fib with IV chemo treatments  . Shortness of breath     with exertion  . Tuberculosis     years ago-had 1 year tx  . 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 Breast cancer     (Rt) breast ca dx 9/11  . 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 Neuropathy     PAST SURGICAL HISTORY: Past  Surgical History  Procedure Laterality Date  . Tubal ligation    . Portacath placement Left   . Abdominal hysterectomy    . Lung biopsy    . Lesion excision      L anterior thigh  . Breast surgery  12/01/10    ER+,PR+,Ki 67 64%,Her2 -; right breast lumpectomy  . Chest tube insertion  08/08/2012    Procedure: INSERTION PLEURAL DRAINAGE CATHETER;  Surgeon: Alleen Borne, MD;  Location: [facility] OR;  Service: Thoracic;  Laterality: Right;  . Removal of pleural drainage catheter Right 10/10/2012    Procedure: REMOVAL OF PLEURAL DRAINAGE CATHETER;  Surgeon:  Alleen Borne, MD;  Location: [facility] OR;  Service: Thoracic;  Laterality: Right;  . Mastectomy, radical Left 05/08/2013    Dr Carolynne Edouard  . Mastectomy modified radical Left 05/08/2013    Procedure: MASTECTOMY MODIFIED RADICAL;  Surgeon: Robyne Askew, MD;  Location[facility] OR;  Service: General;  Laterality: Left;    FAMILY HISTORY No family history on file. The patient's father died in an automobile accident at age 45. The patient's mother died at age 47 with Alzheimer's disease. The patient had 8 brothers and 4 sisters. There is no history of breast or ovarian cancer in the family.  GYNECOLOGIC HISTORY: Menarche age 46, first live birth age 43. She is GX P7. She underwent menopause "more than 20 years ago". She never took hormone replacement.  SOCIAL HISTORY: She has been mostly a housewife. Her husband Jeanette Morris used to be a cook at [employer]. He completed radiation therapy for prostate cancer under [facility] April of 2014. The patient's children are from an earlier marriage, and include Jeanette Morris, Jeanette Morris, [address], [address], Jeanette Morris, Jeanette Morris, and Jeanette Morris. The patient has 15 grandchildren and 1 great-grandchild. She at tends [place]. [employer]  ADVANCED DIRECTIVES: Not in place  HEALTH MAINTENANCE: History  Substance Use Topics  . Smoking status: Former Smoker    Quit date: 06/30/1973  . Smokeless tobacco: Never Used  . Alcohol Use: No     Colonoscopy:  PAP:  Bone density:  Lipid panel:  No Known Allergies  Current Outpatient Prescriptions  Medication Sig Dispense Refill  . Calcium Carbonate-Vitamin D 600-400 MG-UNIT per tablet Take 1 tablet by mouth every morning.       . carvedilol (COREG) 12.5 MG tablet Take 1 tablet (12.5 mg total) by mouth 2 (two) times daily with a meal. Take With 6.25 mg tablet to make a total strength of 18.75mg       . carvedilol (COREG) 6.25 MG tablet Take 6.25 mg by mouth daily. Takes with a 12.5 mg dose to  total 18.75 mg daily      . cyanocobalamin 500 MCG tablet Take 500 mcg by mouth daily.      . ferrous sulfate 325 (65 FE) MG tablet Take 1 tablet (325 mg total) by mouth daily with breakfast.  30 tablet  1  . gabapentin (NEURONTIN) 300 MG capsule Take 300 mg by mouth at bedtime.       Marland Kitchen oxyCODONE-acetaminophen (PERCOCET/ROXICET) 5-325 MG per tablet Take 1 tablet by mouth every 4 (four) hours as needed for pain.      Marland Kitchen spironolactone (ALDACTONE) 25 MG tablet Take 1 tablet (25 mg total) by mouth daily.  30 tablet  6   No current facility-administered medications for this visit.    OBJECTIVE: Elderly African American woman wearing a chest binder Filed Vitals:   05/28/13  0947  BP: 169/92  Pulse: 101  Temp: 98.2 F (36.8 C)  Resp: 20   Filed Weights   05/28/13 0947  Weight: 235 lb 3.2 oz (106.686 kg)      Body mass index is 39.14 kg/(m^2).    ECOG FS: 2  Sclerae unicteric, pupils equal round and reactive Oropharynx clear No cervical or supraclavicular adenopathy  Lungs no rales or rhonchi Heart regular rate and rhythm, no murmur appreciated Abdomen  obese, soft, nontender, positive bowel sounds  MSK no focal spinal tenderness Right upper extremity lymphedema, stable,  New left upper extremity lymphedema, 2+, no erythema noted Neuro: nonfocal, well oriented, positive affect Breasts: Exam was limited by the patient's chest binder. The left in 6 decision however looks to be healing nicely, with no dehiscence. Staples are still in place. There is a drain in place with approximately 30 cc of serosanguineous fluid in the bulb. There is a large hematoma in the left superior anterior chest wall.Samuel Bouche is intact the left upper chest wall, with no evidence of erythema or edema.    LAB RESULTS: Lab Results  Component Value Date   WBC 4.4 05/09/2013   NEUTROABS 2.2 04/11/2013   HGB 8.7* 05/09/2013   HCT 26.8* 05/09/2013   MCV 91.8 05/09/2013   PLT 174 05/09/2013      Chemistry       Component Value Date/Time   NA 135 05/09/2013 0920   NA 140 04/11/2013 0915   K 4.4 05/09/2013 0920   K 4.7 04/11/2013 0915   CL 103 05/09/2013 0920   CL 106 02/14/2013 1048   CO2 26 05/09/2013 0920   CO2 27 04/11/2013 0915   BUN 17 05/09/2013 0920   BUN 18.9 04/11/2013 0915   CREATININE 1.17* 05/09/2013 0920   CREATININE 1.2* 04/11/2013 0915      Component Value Date/Time   CALCIUM 8.4 05/09/2013 0920   CALCIUM 9.6 04/11/2013 0915   ALKPHOS 74 04/11/2013 0915   ALKPHOS 91 08/08/2012 0658   AST 21 04/11/2013 0915   AST 86* 08/08/2012 0658   ALT 12 04/11/2013 0915   ALT 117* 08/08/2012 0658   BILITOT 0.44 04/11/2013 0915   BILITOT 0.3 08/08/2012 0658       Lab Results  Component Value Date   LABCA2 95* 08/14/2012    STUDIES: Dg Chest 2 View  05/28/2013   CLINICAL DATA:  Metastatic breast cancer. Left mastectomy 2 weeks ago  EXAM: CHEST  2 VIEW  COMPARISON:  03/28/2013  FINDINGS: Postop left mastectomy. Skin staples in place. Soft tissue drain in place on the left.  Port-A-Cath tip in the SVC. The catheter tip may be against the lateral wall of the SVC.  Cardiac enlargement without heart failure. Small bilateral pleural effusions. Negative for pneumonia.  Porcelain gallbladder again noted.  IMPRESSION: Small bilateral effusions. Negative for pneumonia or edema.   Electronically Signed   By: Marlan Palau M.D.   On: 05/28/2013 09:17   ASSESSMENT: 67 y.o. Jeanette Morris woman with stage 4 breast cancer  (1) status post RIGHT axillary lymph node biopsy 05/27/2010 for a clinical T4 N1, stage IIIB (inflammatory) invasive ductal carcinoma, grade not stated, estrogen receptor 94% positive, progesterone receptor 64% positive, with an MIB-1 of 60%, and HER-2 amplification by FISH with a ratio of 7.5.  (2) neoadjuvant chemotherapy consisted of 6 cycles of standard carboplatin, docetaxel, trastuzumab, with the trastuzumab held cycles 5 and 6 due to a drop in her borderline ejection fraction (from 45-50%  at  baseline to 40-45% January 2012)-- total trastuzumab treatment = 2 months  (3) status post right mastectomy and axillary lymph node dissection 12/01/2010, showing no residual invasive carcinoma in the breast, but residual tumor in 20 of 21 axillary lymph nodes. (Only one of the 20 positive lymph nodes showed a significant residual tumor, measuring 5 mm; the remaining lymph nodes, including the evidence of extracapsular extension, showed single and clusters a few neoplastic cells with extensive therapy related changes).  (4) postmastectomy radiation to the right chest wall, axilla, and supraclavicular region completed 03/07/2011  (5) additional 2 months of trastuzumab given February through April 2013, again discontinued because of a drop in the ejection fraction and lateral S'  (6) on letrozole July 2012 to September 2013  (7) LEFT axillary adenopathy noted by Dr. Carolynne Edouard on exam July 2013, with biopsy of a left axillary node 04/17/2012 showing an invasive ductal carcinoma, estrogen receptor 91% positive, progesterone receptor 9% positive, with an MIB-1 of 45%, and no HER-2 amplification.   (8) PET scan 04/03/2012 showed multiple enlarged left axillary and small left supraclavicular lymph nodes that are moderately hypermetabolic, with some hypermetabolic activity within the left axillary tail. No other hypermetabolic lymph nodes identified. There was no involvement of the right axilla, mediastinum or right chest wall and no extrathoracic metastases were demonstrated.  (8) on exemestane and everolimus September through December 2013  (9) s/p RIGHT thoracentesis 08/01/2012, cytologically positive; s/p temporary Pleurx; effusion resolved after Abraxane therapy  (10) Abraxane started 08/16/2012, given day 1 and day 8 of each 21 day cycle, completed 6 cycles (12 doses) 12/06/2012, with excellent response  (11) fulvestrant started 12/19/2012  (12) trastuzumab resumed 12/21/2011. We scheduled the doses 4  weeks apart to see if they can be better tolerated. However echo 02/04/2013 again showed a drop in the EF and S', so Herceptin stopped (last dose 01/17/2013). Most recent echo, 04/18/2013, shows the ejection fraction to have recovered.  (13) status post left modified radical mastectomy 05/08/2013 for a pT4, pN2a [tumor extended over 15 cm, all 6 sampled lymph nodes were involved) invasive lobular breast cancer, grade 3, which was HER-2 negative. It was estrogen receptor positive at 72%, progesterone receptor negative. Posterior margin was broadly positive  (14) tamoxifen started 05/29/2013  PLAN:  Lahela's situation is complex and we spent approximately 45 minutes today going over things.. She had a right-sided HER-2 positive breast cancer. That responded well to chemotherapy and Herceptin. However she tolerated the anti-HER-2 therapy poorly, with repeated drops in her ejection fraction. It is doubtful that she could tolerate anything beyond lapatinib in the future, although TDM 1 remains a possibility.  The left-sided breast cancer is lobular. It is unlikely to significantly respond to chemotherapy, although that remains an option. She has had multiple anti-estrogen treatment in the past, but those were chiefly and add her right breast cancer. However her left breast cancer did develop during anti-estrogen therapy. After much discussion today we decided that for now she is going to start tamoxifen. She has a good understanding of the possible toxicities, side effects and complications. She will have her drain removed likely next week and I have asked for an appointment with radiation oncology so a definitive decision can be made regarding whether she should receive adjuvant radiation to the left chest wall area [(the preliminary discussion on at the multidisciplinary breast cancer conference 05/22/2013 suggested that perhaps radiation to "save for recurrence").  From my point of view the plan is going  to be to start tamoxifen now and then obtain a repeat PET scan in about 2 months. She already has lymphedema in the left upper extremity, and she has a rather large hematoma in the left anterior chest wall, so we are going to wait another 2 or 3 weeks before referring her to physical therapy to get a sleeve and learn some exercises. At the next visit we will also begin to clarify her advanced directives choices. Today I also refilled the Tameeka's Percocets, since she still having significant pain postoperatively. Mandie knows to call for any problems that may develop before then. MAGRINAT,GUSTAV C    05/28/2013

## 2013-05-28 NOTE — Telephone Encounter (Signed)
sw pt informed her that her her appts for 06/10/13 are canceled.pt is aware ...td

## 2013-05-28 NOTE — Telephone Encounter (Signed)
appts made and printed. gv pt appt for [place]...td

## 2013-05-30 ENCOUNTER — Other Ambulatory Visit: Payer: Self-pay | Admitting: Oncology

## 2013-05-30 NOTE — Progress Notes (Signed)
ID: Jeanette Morris   DOB: 06-08-1946  MR#: [id number]  CSN#:[id number]  PCP: Jeanette Garibaldi, NP GYN:  [name] OTHER MD: Jeanette Morris, Jeanette Morris, Jeanette Morris   HISTORY OF PRESENT ILLNESS: The patient undergoes annual screening mammography.  Her mammograms previously had fullness.  In August 2011 she presented with some firmness of her right breast. She also noted some skin thickening around her nipple and some tenderness.  She was subsequently seen for this on 05/13/2010 and was referred for bilateral mammogram and right breast ultrasound.  There was concern that this may be mastitis.  There were diffuse skin thickening, tissue edema, enlarged lymph node in right axilla, some subareolar distortion secondary to the previous surgery.  She was given a course of doxycycline for 10 days and asked to return. A follow-up ultrasound of the breast showed persistent abnormalities.  She was referred for biopsy.  She underwent biopsy of the right axilla on 05/27/2010 and it showed metastatic carcinoma consistent with breast primary.  This was ER and PR positive, HER-2 was positive with a ratio of 3.09.  The ER ratio is 34%, PR was 64%, proliferative index 60%.  She subsequently had a MRI scan of both breasts on 06/03/2010 and this showed a area of minimal enhancement in the right breast measuring 10.0 x 6.0 x 3.0 cm.  Enlarged lymph nodes were seen in the right axilla.  Largest node measuring 3.0 x 1.0 x 7.0 cm.  Intramammary lymph nodes were also seen in the outer left breast, the largest measuring 2.8 x 2.5 cm.  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 The patient's subsequent history is as detailed below.  INTERVAL HISTORY: Jeanette Morris returns today accompanied by her husband Jeanette Morris for followup of her metastatic breast cancer. Since her last visit here, she underwent left modified radical mastectomy (05/08/2013). This found an invasive  lobular breast cancer measuring 15 cm. It involved the skin, including the dermal lymphatics, and all 6 of this 6 axillary lymph nodes sampled. The invasive carcinoma was broadly present at the posterior margin. Dr. Carolynne Morris saw the patient with me today to possibly remove the drain, but it is borderline in his opinion and so the drain will stay at least another week.  REVIEW OF SYSTEMS: Jeanette Morris is still having significant pain from the surgery, and is taking Percocet every 4-6 hours. This is not constipating her. He does control the pain pretty well. She is not having any bleeding or fever. She does have some arthritis symptoms here in there, but are not more persistent or intense than usual. She denies any headaches, nausea, vomiting, visual changes, or gait imbalance. She denies cough phlegm production or pleurisy. She denies chest pain or pressure. A detailed review systems today was otherwise stable.   PAST MEDICAL HISTORY: Past Medical History  Diagnosis Date  . Hypertension   . Hearing loss     right ear  . Hyperlipidemia   . Arthritis   . Dysrhythmia     A fib with IV chemo treatments  . Shortness of breath     with exertion  . Tuberculosis     years ago-had 1 year tx  . 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 Breast cancer     (Rt) breast ca dx 9/11  . 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 Neuropathy     PAST SURGICAL HISTORY: Past  Surgical History  Procedure Laterality Date  . Tubal ligation    . Portacath placement Left   . Abdominal hysterectomy    . Lung biopsy    . Lesion excision      L anterior thigh  . Breast surgery  12/01/10    ER+,PR+,Ki 67 64%,Her2 -; right breast lumpectomy  . Chest tube insertion  08/08/2012    Procedure: INSERTION PLEURAL DRAINAGE CATHETER;  Surgeon: Jeanette Borne, MD;  Location: [facility] OR;  Service: Thoracic;  Laterality: Right;  . Removal of pleural drainage catheter Right 10/10/2012    Procedure: REMOVAL OF PLEURAL DRAINAGE CATHETER;  Surgeon:  Jeanette Borne, MD;  Location: [facility] OR;  Service: Thoracic;  Laterality: Right;  . Mastectomy, radical Left 05/08/2013    Dr Jeanette Morris  . Mastectomy modified radical Left 05/08/2013    Procedure: MASTECTOMY MODIFIED RADICAL;  Surgeon: Jeanette Askew, MD;  Location: [facility] OR;  Service: General;  Laterality: Left;    FAMILY HISTORY No family history on file. The patient's father died in an automobile accident at age 54. The patient's mother died at age 22 with Alzheimer's disease. The patient had 8 brothers and 4 sisters. There is no history of breast or ovarian cancer in the family.  GYNECOLOGIC HISTORY: Menarche age 59, first live birth age 9. She is GX P7. She underwent menopause "more than 20 years ago". She never took hormone replacement.  SOCIAL HISTORY: She has been mostly a housewife. Her husband Jeanette Morris used to be a cook at [employer]. He completed radiation therapy for prostate cancer under Jeanette Morris April of 2014. The patient's children are from an earlier marriage, and include Jeanette Morris, Jeanette Morris, [address], [address], Jeanette Morris, [place], and [employer]. The patient has 15 grandchildren and 1 great-grandchild. She at tends [place]. [employer]  ADVANCED DIRECTIVES: Not in place  HEALTH MAINTENANCE: History  Substance Use Topics  . Smoking status: Former Smoker    Quit date: 06/30/1973  . Smokeless tobacco: Never Used  . Alcohol Use: No     Colonoscopy:  PAP:  Bone density:  Lipid panel:  No Known Allergies  Current Outpatient Prescriptions  Medication Sig Dispense Refill  . Calcium Carbonate-Vitamin D 600-400 MG-UNIT per tablet Take 1 tablet by mouth every morning.       . carvedilol (COREG) 12.5 MG tablet Take 1 tablet (12.5 mg total) by mouth 2 (two) times daily with a meal. Take With 6.25 mg tablet to make a total strength of 18.75mg       . carvedilol (COREG) 6.25 MG tablet Take 6.25 mg by mouth daily. Takes with a 12.5 mg dose to  total 18.75 mg daily      . cyanocobalamin 500 MCG tablet Take 500 mcg by mouth daily.      . ferrous sulfate 325 (65 FE) MG tablet Take 1 tablet (325 mg total) by mouth daily with breakfast.  30 tablet  1  . gabapentin (NEURONTIN) 300 MG capsule Take 300 mg by mouth at bedtime.       Marland Kitchen oxyCODONE-acetaminophen (PERCOCET/ROXICET) 5-325 MG per tablet Take 1 tablet by mouth every 4 (four) hours as needed for pain.  60 tablet  0  . spironolactone (ALDACTONE) 25 MG tablet Take 1 tablet (25 mg total) by mouth daily.  30 tablet  6  . tamoxifen (NOLVADEX) 20 MG tablet Take 1 tablet (20 mg total) by mouth daily.  90 tablet  4   No current  facility-administered medications for this visit.    OBJECTIVE: Elderly African American woman wearing a chest binder There were no vitals filed for this visit. There were no vitals filed for this visit.    There is no weight on file to calculate BMI.    ECOG FS: 2  Sclerae unicteric, pupils equal round and reactive Oropharynx clear No cervical or supraclavicular adenopathy  Lungs no rales or rhonchi Heart regular rate and rhythm, no murmur appreciated Abdomen  obese, soft, nontender, positive bowel sounds  MSK no focal spinal tenderness Right upper extremity lymphedema, stable,  New left upper extremity lymphedema, 2+, no erythema noted Neuro: nonfocal, well oriented, positive affect Breasts: Exam was limited by the patient's chest binder. The left in 6 decision however looks to be healing nicely, with no dehiscence. Staples are still in place. There is a drain in place with approximately 30 cc of serosanguineous fluid in the bulb. There is a large hematoma in the left superior anterior chest wall.Samuel Bouche is intact the left upper chest wall, with no evidence of erythema or edema.    LAB RESULTS: Lab Results  Component Value Date   WBC 4.4 05/09/2013   NEUTROABS 2.2 04/11/2013   HGB 8.7* 05/09/2013   HCT 26.8* 05/09/2013   MCV 91.8 05/09/2013   PLT 174  05/09/2013      Chemistry      Component Value Date/Time   NA 135 05/09/2013 0920   NA 140 04/11/2013 0915   K 4.4 05/09/2013 0920   K 4.7 04/11/2013 0915   CL 103 05/09/2013 0920   CL 106 02/14/2013 1048   CO2 26 05/09/2013 0920   CO2 27 04/11/2013 0915   BUN 17 05/09/2013 0920   BUN 18.9 04/11/2013 0915   CREATININE 1.17* 05/09/2013 0920   CREATININE 1.2* 04/11/2013 0915      Component Value Date/Time   CALCIUM 8.4 05/09/2013 0920   CALCIUM 9.6 04/11/2013 0915   ALKPHOS 74 04/11/2013 0915   ALKPHOS 91 08/08/2012 0658   AST 21 04/11/2013 0915   AST 86* 08/08/2012 0658   ALT 12 04/11/2013 0915   ALT 117* 08/08/2012 0658   BILITOT 0.44 04/11/2013 0915   BILITOT 0.3 08/08/2012 0658       Lab Results  Component Value Date   LABCA2 95* 08/14/2012    STUDIES: Dg Chest 2 View  05/28/2013   CLINICAL DATA:  Metastatic breast cancer. Left mastectomy 2 weeks ago  EXAM: CHEST  2 VIEW  COMPARISON:  03/28/2013  FINDINGS: Postop left mastectomy. Skin staples in place. Soft tissue drain in place on the left.  Port-A-Cath tip in the SVC. The catheter tip may be against the lateral wall of the SVC.  Cardiac enlargement without heart failure. Small bilateral pleural effusions. Negative for pneumonia.  Porcelain gallbladder again noted.  IMPRESSION: Small bilateral effusions. Negative for pneumonia or edema.   Electronically Signed   By: Marlan Palau M.D.   On: 05/28/2013 09:17   ASSESSMENT: 67 y.o. [place] woman with what are best thought of as two separate breast cancers, the one on the Right being ductal, stage IV, HER-2 positive, the one on the left being lobular, stage IIIB, HER-2 negative; both are estrogen receptor positive  RIGHT BREAST:  (1) status post RIGHT axillary lymph node biopsy 05/27/2010 for a clinical T4 N1, stage IIIB (inflammatory) invasive ductal carcinoma, grade not stated, estrogen receptor 94% positive, progesterone receptor 64% positive, with an MIB-1 of 60%, and HER-2  amplification  by [facility] with a ratio of 7.5.  (2) neoadjuvant chemotherapy consisted of 6 cycles of standard carboplatin, docetaxel, trastuzumab, with the trastuzumab held cycles 5 and 6 due to a drop in her borderline ejection fraction (from 45-50% at baseline to 40-45% January 2012)-- total trastuzumab treatment = 2 months  (3) status post right mastectomy and axillary lymph node dissection 12/01/2010, showing no residual invasive carcinoma in the breast, but residual tumor in 20 of 21 axillary lymph nodes. (Only one of the 20 positive lymph nodes showed a significant residual tumor, measuring 5 mm; the remaining lymph nodes, including the evidence of extracapsular extension, showed single and clusters a few neoplastic cells with extensive therapy related changes).  (4) postmastectomy radiation to the right chest wall, axilla, and supraclavicular region completed 03/07/2011  (5) additional 2 months of trastuzumab given February through April 2013, again discontinued because of a drop in the ejection fraction and lateral S'  (6) on letrozole July 2012 to September 2013  (7) on exemestane and everolimus September through December 2013  (8) s/p RIGHT thoracentesis 08/01/2012, cytologically positive; s/p temporary Pleurx; effusion resolved after Abraxane therapy  (9) Abraxane started 08/16/2012, given day 1 and day 8 of each 21 day cycle, completed 6 cycles (12 doses) 12/06/2012, with excellent response  (10) trastuzumab resumed 12/21/2011. We scheduled the doses 4 weeks apart to see if they can be better tolerated. However echo 02/04/2013 again showed a drop in the EF and S', so Herceptin stopped (last dose 01/17/2013). Most recent echo, 04/18/2013, shows the ejection fraction to have recovered. Patient is felt by cardiology not to be a good candidate for further trastuzumab. Lapatinib remains a possibility  LEFT BREAST  (11) LEFT axillary adenopathy noted by Dr. Carolynne Morris on exam July 2013, with  biopsy of a left axillary node 04/17/2012 showing an invasive ductal carcinoma, estrogen receptor 91% positive, progesterone receptor 9% positive, with an MIB-1 of 45%, and no HER-2 amplification.   (12) PET scan 04/03/2012 showed multiple enlarged left axillary and small left supraclavicular lymph nodes that are moderately hypermetabolic, with some hypermetabolic activity within the left axillary tail. No other hypermetabolic lymph nodes identified. There was no involvement of the right axilla, mediastinum or right chest wall and no extrathoracic metastases were demonstrated.  (13) fulvestrant started 12/19/2012, discontinued September 2014 with progression  (13) status post left modified radical mastectomy 05/08/2013 for a pT4, pN2a [tumor extended over 15 cm, all 6 sampled lymph nodes were involved) invasive lobular breast cancer, grade 3, which was HER-2 negative. It was estrogen receptor positive at 72%, progesterone receptor negative. Posterior margin was broadly positive  (14) tamoxifen started 05/29/2013  PLAN:  Jalena's situation is complex and we spent approximately 45 minutes today going over things.. She had a right-sided HER-2 positive breast cancer. That responded well to chemotherapy and Herceptin. However she tolerated the anti-HER-2 therapy poorly, with repeated drops in her ejection fraction. It is doubtful that she could tolerate anything beyond lapatinib in the future, although TDM 1 remains a possibility.  The left-sided breast cancer is lobular. It is unlikely to significantly respond to chemotherapy, although that remains an option. She has had multiple anti-estrogen treatment in the past, but those were chiefly and add her right breast cancer. However her left breast cancer did develop during anti-estrogen therapy. After much discussion today we decided that for now she is going to start tamoxifen. She has a good understanding of the possible toxicities, side effects and  complications. She will have  her drain removed likely next week and I have asked for an appointment with radiation oncology so a definitive decision can be made regarding whether she should receive adjuvant radiation to the left chest wall area [(the preliminary discussion on at the multidisciplinary breast cancer conference 05/22/2013 suggested that perhaps radiation to "save for recurrence").  From my point of view the plan is going to be to start tamoxifen now and then obtain a repeat PET scan in about 2 months. She already has lymphedema in the left upper extremity, and she has a rather large hematoma in the left anterior chest wall, so we are going to wait another 2 or 3 weeks before referring her to physical therapy to get a sleeve and learn some exercises. At the next visit we will also begin to clarify her advanced directives choices. Today I also refilled the Florentina's Percocets, since she still having significant pain postoperatively. Dinara knows to call for any problems that may develop before then. MAGRINAT,GUSTAV C    05/30/2013

## 2013-06-03 NOTE — Progress Notes (Signed)
Jeanette Bensimhon,MD 1:06 PM   

## 2013-06-04 ENCOUNTER — Encounter: Payer: Self-pay | Admitting: Radiation Oncology

## 2013-06-04 ENCOUNTER — Ambulatory Visit (INDEPENDENT_AMBULATORY_CARE_PROVIDER_SITE_OTHER): Payer: PRIVATE HEALTH INSURANCE | Admitting: General Surgery

## 2013-06-04 ENCOUNTER — Encounter (INDEPENDENT_AMBULATORY_CARE_PROVIDER_SITE_OTHER): Payer: Self-pay | Admitting: General Surgery

## 2013-06-04 VITALS — BP 150/86 | HR 84 | Resp 16 | Ht 65.0 in | Wt 237.6 lb

## 2013-06-04 DIAGNOSIS — C50919 Malignant neoplasm of unspecified site of unspecified female breast: Secondary | ICD-10-CM

## 2013-06-04 DIAGNOSIS — C50912 Malignant neoplasm of unspecified site of left female breast: Secondary | ICD-10-CM

## 2013-06-04 NOTE — Progress Notes (Signed)
Location of Breast Cancer: left breast  Histology per Pathology Report:  05/08/13 Diagnosis Breast, modified radical mastectomy , left - INVASIVE LOBULAR CARCINOMA, GRADE III/III, SPANNING 15.0 CM. - INVASIVE CARCINOMA IS BROADLY PRESENT AT THE POSTERIOR RESECTION MARGIN. - LYMPHOVASCULAR INVASION IS IDENTIFIED. - LOBULAR CARCINOMA IN SITU. - CARCINOMA INVOLVES THE SKIN, INCLUDING DERMAL LYMPHATICS. - METASTATIC CARCINOMA IN 6 OF 6 LYMPH NODES (6/6).  Receptor Status: ER(72 % positive), PR (negative), Her2-neu (negative)  Did patient present with symptoms (if so, please note symptoms) or was this found on screening mammography?:LEFT axillary adenopathy noted by Dr. Carolynne Edouard on exam July 2013  Past/Anticipated interventions by surgeon, if any: MASTECTOMY MODIFIED RADICAL;  Surgeon: Robyne Askew, MD;  Left; on 05/08/2013.  Past/Anticipated interventions by medical oncology, if any: fulvestrant started 12/19/2012, discontinued September 2014 with progression.  Tamoxifen 20 mg daily, started 05/29/2013  Lymphedema issues, if any:  left arm, hand since surgery, pt applies lymphedema sleeve at times  Pain issues, if any:  Post op pain in left chest wall, axilla  SAFETY ISSUES:  Prior radiation? Yes -01/18/11-03/07/11 5940 cGy to Right chest wall, axilla and supraclavicular region   Pacemaker/ICD? no  Possible current pregnancy? no  Is the patient on methotrexate? no  Current Complaints / other details: married, fatigued w/loss of appetite. Husband w/pt today.

## 2013-06-04 NOTE — Patient Instructions (Signed)
May shower  Activity as tolerated

## 2013-06-05 ENCOUNTER — Ambulatory Visit
Admission: RE | Admit: 2013-06-05 | Discharge: 2013-06-05 | Disposition: A | Discharge status: Home / Self Care | Payer: PRIVATE HEALTH INSURANCE | Source: Ambulatory Visit | Attending: Radiation Oncology | Admitting: Radiation Oncology

## 2013-06-05 ENCOUNTER — Encounter: Payer: Self-pay | Admitting: Radiation Oncology

## 2013-06-05 VITALS — BP 152/92 | HR 92 | Temp 99.0°F | Resp 20 | Wt 237.3 lb

## 2013-06-05 DIAGNOSIS — I10 Essential (primary) hypertension: Secondary | ICD-10-CM | POA: Insufficient documentation

## 2013-06-05 DIAGNOSIS — C50912 Malignant neoplasm of unspecified site of left female breast: Secondary | ICD-10-CM

## 2013-06-05 DIAGNOSIS — Z923 Personal history of irradiation: Secondary | ICD-10-CM | POA: Insufficient documentation

## 2013-06-05 DIAGNOSIS — C50919 Malignant neoplasm of unspecified site of unspecified female breast: Secondary | ICD-10-CM | POA: Insufficient documentation

## 2013-06-05 DIAGNOSIS — I890 Lymphedema, not elsewhere classified: Secondary | ICD-10-CM | POA: Insufficient documentation

## 2013-06-05 DIAGNOSIS — Z79899 Other long term (current) drug therapy: Secondary | ICD-10-CM | POA: Insufficient documentation

## 2013-06-05 DIAGNOSIS — Z170 Estrogen receptor positive status [ER+]: Secondary | ICD-10-CM | POA: Insufficient documentation

## 2013-06-05 DIAGNOSIS — Z9221 Personal history of antineoplastic chemotherapy: Secondary | ICD-10-CM | POA: Insufficient documentation

## 2013-06-05 HISTORY — DX: Personal history of irradiation: Z92.3

## 2013-06-05 NOTE — Progress Notes (Signed)
Please see the Nurse Progress Note in the MD Initial Consult Encounter for this patient. 

## 2013-06-05 NOTE — Progress Notes (Signed)
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  estrogen receptor positive at 72%, progesterone receptor negative.                        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 female who is seen out courtesy of Dr.Magrinat for an opinion concerning radiation therapy as part of management of patient's second breast cancer involving the left breast area.   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 She was treated with neoadjuvant chemotherapy for 6 cycles consisting of carboplatinum doxi Taxel and trastuzumab. The patient then proceeded to undergo right mastectomy and axillary lymph node dissection which revealed no invasive carcinoma in the right breast but a residual 20/21 axillary lymph nodes. The patient proceeded to undergo postmastectomy irradiation along the right side including the axillary and supraclavicular region. Treatments completed 03/07/2011. She received additional trastuzumab after her surgery and then was placed on letrozole then exemestane and everolimus.   earlier this year the patient palpated some swelling in her left axillary region. She was seen by Dr. Carolynne Edouard and imaging revealed axillary lymphadenopathy. Breasts mammogram showed a markedly abnormal left breast. She underwent on September 10 the patient underwent a left modified radical mastectomy. He was found to have invasive  lobular carcinoma grade 3 extending over 15 cm. The posterior resection margin was broadly positive for invasive carcinoma. Lymphovascular space  invasion was noted in the tumor did involve the skin including the dermal lymphatics. 6 out of 6 lymph nodes were involved with tumor. The patient is now recovering from his surgery. She was seen in medical oncology and is now referred to radiation oncology for consideration for postmastectomy irradiation along the left side.  PREVIOUS RADIATION THERAPY: Yes the right chest wal, axilla and supraclavicular region received 45 gray in 25 fractions. The mastectomy scar region was boosted to 59.4 gray. The patient completed her radiation therapy on  03/07/2011.  PAST MEDICAL HISTORY:  has a past medical history of Hypertension; Hearing loss; Hyperlipidemia; Arthritis; Dysrhythmia; Shortness of breath; Tuberculosis; Cancer; Breast cancer; Ringing in right ear; Neuropathy; and History of radiation therapy (01/18/11-03/07/11).    PAST SURGICAL HISTORY: Past Surgical History  Procedure Laterality Date  . Tubal ligation    . Portacath placement Left   . Abdominal hysterectomy    . Lung biopsy    . Lesion excision      L anterior thigh  . Breast surgery  12/01/10    ER+,PR+,Ki 67 64%,Her2 -; right breast lumpectomy  . Chest tube insertion  08/08/2012    Procedure: INSERTION PLEURAL DRAINAGE CATHETER;  Surgeon: Alleen Borne, MD;  Location: [facility] OR;  Service: Thoracic;  Laterality: Right;  . Removal of pleural drainage catheter Right 10/10/2012    Procedure: REMOVAL OF PLEURAL DRAINAGE CATHETER;  Surgeon: Alleen Borne, MD;  Location: [facility] OR;  Service: Thoracic;  Laterality: Right;  . Mastectomy, radical Left 05/08/2013    Dr Carolynne Edouard  . Mastectomy modified radical Left 05/08/2013  Procedure: MASTECTOMY MODIFIED RADICAL;  Surgeon: Robyne Askew, MD;  Location: [facility] OR;  Service: General;  Laterality: Left;    FAMILY HISTORY: family history is not on file.  SOCIAL HISTORY:  reports that she quit smoking about 39 years ago. She has never used smokeless tobacco. She reports that she does not drink alcohol or use  illicit drugs.  ALLERGIES: Review of patient's allergies indicates no known allergies.  MEDICATIONS:  Current Outpatient Prescriptions  Medication Sig Dispense Refill  . Calcium Carbonate-Vitamin D 600-400 MG-UNIT per tablet Take 1 tablet by mouth every morning.       . carvedilol (COREG) 12.5 MG tablet Take 1 tablet (12.5 mg total) by mouth 2 (two) times daily with a meal. Take With 6.25 mg tablet to make a total strength of 18.75mg       . carvedilol (COREG) 6.25 MG tablet Take 6.25 mg by mouth daily. Takes with a 12.5 mg dose to total 18.75 mg daily      . cyanocobalamin 500 MCG tablet Take 500 mcg by mouth daily.      . ferrous sulfate 325 (65 FE) MG tablet Take 1 tablet (325 mg total) by mouth daily with breakfast.  30 tablet  1  . gabapentin (NEURONTIN) 300 MG capsule Take 300 mg by mouth at bedtime.       Marland Kitchen oxyCODONE-acetaminophen (PERCOCET/ROXICET) 5-325 MG per tablet Take 1 tablet by mouth every 4 (four) hours as needed for pain.  60 tablet  0  . spironolactone (ALDACTONE) 25 MG tablet Take 1 tablet (25 mg total) by mouth daily.  30 tablet  6  . tamoxifen (NOLVADEX) 20 MG tablet Take 1 tablet (20 mg total) by mouth daily.  90 tablet  4   No current facility-administered medications for this encounter.    REVIEW OF SYSTEMS:  A 15 point review of systems is documented in the electronic medical record. This was obtained by the nursing staff. However, I reviewed this with the patient to discuss relevant findings and make she has soreness along the left chest in particular left upper chest. She does required narcotics for this pain. She has noticed swelling in her left arm. She denies any cough or breathing problems. She denies any pain along the right chest wall area.   PHYSICAL EXAM:  weight is 237 lb 4.8 oz (107.639 kg). Her oral temperature is 99 F (37.2 C). Her blood pressure is 152/92 and her pulse is 92. Her respiration is 20.  this is a very pleasant 67 year old female in no acute  distress. She is accompanied by her husband on evaluation today. Examination of the neck area reveals a palpable adenopathy filling the left supraclavicular fossa.  The right supraclavicular fossa is free of adenopathy. The lungs are clear to auscultation. The heart has regular rhythm and rate. Examination of the right chest wall area reveals a mastectomy scar. There is no palpable or visible signs recurrence. Patient does have radiation changes along the right chest wall area.  examination of the left chest area reveals swelling  above the mastectomy scar. There is a bluish discoloration to this area. Patient is tender with palpation in this region. There's no obvious signs of infection or drainage from the mastectomy scar region. The right arm shows some lymphedema. The left arm shows more significant lymphedema. There is no obvious palpable cords in the left upper arm but this is difficult to evaluate clinically. I should mention of patient's arm swelling decreases at night  with arm elevation.   LABORATORY DATA:  Lab Results  Component Value Date   WBC 4.4 05/09/2013   HGB 8.7* 05/09/2013   HCT 26.8* 05/09/2013   MCV 91.8 05/09/2013   PLT 174 05/09/2013   Lab Results  Component Value Date   NA 135 05/09/2013   K 4.4 05/09/2013   CL 103 05/09/2013   CO2 26 05/09/2013   Lab Results  Component Value Date   ALT 12 04/11/2013   AST 21 04/11/2013   ALKPHOS 74 04/11/2013   BILITOT 0.44 04/11/2013     RADIOGRAPHY: Dg Chest 2 View  05/28/2013   CLINICAL DATA:  Metastatic breast cancer. Left mastectomy 2 weeks ago  EXAM: CHEST  2 VIEW  COMPARISON:  03/28/2013  FINDINGS: Postop left mastectomy. Skin staples in place. Soft tissue drain in place on the left.  Port-A-Cath tip in the SVC. The catheter tip may be against the lateral wall of the SVC.  Cardiac enlargement without heart failure. Small bilateral pleural effusions. Negative for pneumonia.  Porcelain gallbladder again noted.  IMPRESSION: Small bilateral  effusions. Negative for pneumonia or edema.   Electronically Signed   By: Marlan Palau M.D.   On: 05/28/2013 09:17      IMPRESSION:   pT4, pN2a   invasive lobular carcinoma of the left breast , grade 3, which was HER-2 negative.  estrogen receptor positive at 72%, progesterone receptor negative, broadly positive posterior margin, i a setting of prior history of inflammatory carcinoma of the right breast, stage IV and development during prior hormonal therapy.  On clinical exam today the patient likely has extensive involvement within the left supraclavicular fossa. She is at high risk for local regional problems in the near future along the left side, with positive surgical margins and palpable lymphadenopathy in the left supraclavicular fossa. I therefore would recommend postmastectomy irradiation therapy.  PLAN: The patient will return in approximately 4 weeks for further evaluation. In approximately 2 weeks she will begin physical therapy to address her left arm mobility and lymphedema. I am hopeful she will be ready for planning and treatment at that time.  She will also undergo a PET scan in approximately 2 months I spent 40 minutes minutes face to face with the patient and more than 50% of that time was spent in counseling and/or coordination of care.   ------------------------------------------------  -----------------------------------  Billie Lade, PhD, MD

## 2013-06-06 ENCOUNTER — Telehealth: Payer: Self-pay | Admitting: *Deleted

## 2013-06-06 NOTE — Telephone Encounter (Signed)
CALLED PATIENT TO INFORM OF APPT. FOR 07-04-13- ARRIVAL TIME - 1:00 PM, SPOKE WITH PATIENT AND SHE IS AWARE OF THIS APPT.

## 2013-06-10 ENCOUNTER — Other Ambulatory Visit: Payer: PRIVATE HEALTH INSURANCE | Admitting: Lab

## 2013-06-10 ENCOUNTER — Ambulatory Visit: Payer: PRIVATE HEALTH INSURANCE | Admitting: Physician Assistant

## 2013-06-17 ENCOUNTER — Telehealth: Payer: Self-pay | Admitting: *Deleted

## 2013-06-17 ENCOUNTER — Ambulatory Visit (HOSPITAL_BASED_OUTPATIENT_CLINIC_OR_DEPARTMENT_OTHER): Payer: PRIVATE HEALTH INSURANCE

## 2013-06-17 ENCOUNTER — Other Ambulatory Visit (HOSPITAL_BASED_OUTPATIENT_CLINIC_OR_DEPARTMENT_OTHER): Payer: PRIVATE HEALTH INSURANCE | Admitting: Lab

## 2013-06-17 ENCOUNTER — Ambulatory Visit (HOSPITAL_BASED_OUTPATIENT_CLINIC_OR_DEPARTMENT_OTHER): Payer: PRIVATE HEALTH INSURANCE | Admitting: Oncology

## 2013-06-17 VITALS — BP 151/84 | HR 82 | Temp 98.3°F | Resp 20 | Ht 65.0 in | Wt 236.0 lb

## 2013-06-17 DIAGNOSIS — C773 Secondary and unspecified malignant neoplasm of axilla and upper limb lymph nodes: Secondary | ICD-10-CM

## 2013-06-17 DIAGNOSIS — C50619 Malignant neoplasm of axillary tail of unspecified female breast: Secondary | ICD-10-CM

## 2013-06-17 DIAGNOSIS — C50412 Malignant neoplasm of upper-outer quadrant of left female breast: Secondary | ICD-10-CM

## 2013-06-17 DIAGNOSIS — C50912 Malignant neoplasm of unspecified site of left female breast: Secondary | ICD-10-CM

## 2013-06-17 DIAGNOSIS — C50911 Malignant neoplasm of unspecified site of right female breast: Secondary | ICD-10-CM

## 2013-06-17 DIAGNOSIS — C50411 Malignant neoplasm of upper-outer quadrant of right female breast: Secondary | ICD-10-CM

## 2013-06-17 DIAGNOSIS — R52 Pain, unspecified: Secondary | ICD-10-CM

## 2013-06-17 LAB — COMPREHENSIVE METABOLIC PANEL (CC13)
ALT: 34 U/L (ref 0–55)
AST: 42 U/L — ABNORMAL HIGH (ref 5–34)
Albumin: 2.7 g/dL — ABNORMAL LOW (ref 3.5–5.0)
Alkaline Phosphatase: 69 U/L (ref 40–150)
BUN: 15.7 mg/dL (ref 7.0–26.0)
Calcium: 9 mg/dL (ref 8.4–10.4)
Chloride: 104 mEq/L (ref 98–109)
Potassium: 4.6 mEq/L (ref 3.5–5.1)
Sodium: 138 mEq/L (ref 136–145)
Total Protein: 8 g/dL (ref 6.4–8.3)

## 2013-06-17 LAB — CBC WITH DIFFERENTIAL/PLATELET
Basophils Absolute: 0 10*3/uL (ref 0.0–0.1)
EOS%: 2.3 % (ref 0.0–7.0)
HGB: 9.4 g/dL — ABNORMAL LOW (ref 11.6–15.9)
MCH: 30.1 pg (ref 25.1–34.0)
NEUT#: 2.6 10*3/uL (ref 1.5–6.5)
RBC: 3.13 10*6/uL — ABNORMAL LOW (ref 3.70–5.45)
RDW: 13.5 % (ref 11.2–14.5)
lymph#: 1.2 10*3/uL (ref 0.9–3.3)

## 2013-06-17 MED ORDER — HEPARIN SOD (PORK) LOCK FLUSH 100 UNIT/ML IV SOLN
500.0000 [IU] | Freq: Once | INTRAVENOUS | Status: AC
  Administered 2013-06-17: 500 [IU] via INTRAVENOUS
  Filled 2013-06-17: qty 5

## 2013-06-17 MED ORDER — OXYCODONE-ACETAMINOPHEN 5-325 MG PO TABS: 1.0000 | ORAL_TABLET | ORAL | Status: DC | PRN

## 2013-06-17 MED ORDER — SODIUM CHLORIDE 0.9 % IJ SOLN
10.0000 mL | INTRAMUSCULAR | Status: DC | PRN
  Administered 2013-06-17: 10 mL via INTRAVENOUS
  Filled 2013-06-17: qty 10

## 2013-06-17 NOTE — Patient Instructions (Signed)
Implanted Port Instructions  An implanted port is a central line that has a round shape and is placed under the skin. It is used for long-term IV (intravenous) access for:  · Medicine.  · Fluids.  · Liquid nutrition, such as TPN (total parenteral nutrition).  · Blood samples.  Ports can be placed:  · In the chest area just below the collarbone (this is the most common place.)  · In the arms.  · In the belly (abdomen) area.  · In the legs.  PARTS OF THE PORT  A port has 2 main parts:  · The reservoir. The reservoir is round, disc-shaped, and will be a small, raised area under your skin.  · The reservoir is the part where a needle is inserted (accessed) to either give medicines or to draw blood.  · The catheter. The catheter is a long, slender tube that extends from the reservoir. The catheter is placed into a large vein.  · Medicine that is inserted into the reservoir goes into the catheter and then into the vein.  INSERTION OF THE PORT  · The port is surgically placed in either an operating room or in a procedural area (interventional radiology).  · Medicine may be given to help you relax during the procedure.  · The skin where the port will be inserted is numbed (local anesthetic).  · 1 or 2 small cuts (incisions) will be made in the skin to insert the port.  · The port can be used after it has been inserted.  INCISION SITE CARE  · The incision site may have small adhesive strips on it. This helps keep the incision site closed. Sometimes, no adhesive strips are placed. Instead of adhesive strips, a special kind of surgical glue is used to keep the incision closed.  · If adhesive strips were placed on the incision sites, do not take them off. They will fall off on their own.  · The incision site may be sore for 1 to 2 days. Pain medicine can help.  · Do not get the incision site wet. Bathe or shower as directed by your caregiver.  · The incision site should heal in 5 to 7 days. A small scar may form after the  incision has healed.  ACCESSING THE PORT  Special steps must be taken to access the port:  · Before the port is accessed, a numbing cream can be placed on the skin. This helps numb the skin over the port site.  · A sterile technique is used to access the port.  · The port is accessed with a needle. Only "non-coring" port needles should be used to access the port. Once the port is accessed, a blood return should be checked. This helps ensure the port is in the vein and is not clogged (clotted).  · If your caregiver believes your port should remain accessed, a clear (transparent) bandage will be placed over the needle site. The bandage and needle will need to be changed every week or as directed by your caregiver.  · Keep the bandage covering the needle clean and dry. Do not get it wet. Follow your caregiver's instructions on how to take a shower or bath when the port is accessed.  · If your port does not need to stay accessed, no bandage is needed over the port.  FLUSHING THE PORT  Flushing the port keeps it from getting clogged. How often the port is flushed depends on:  · If a   constant infusion is running. If a constant infusion is running, the port may not need to be flushed.  · If intermittent medicines are given.  · If the port is not being used.  For intermittent medicines:  · The port will need to be flushed:  · After medicines have been given.  · After blood has been drawn.  · As part of routine maintenance.  · A port is normally flushed with:  · Normal saline.  · Heparin.  · Follow your caregiver's advice on how often, how much, and the type of flush to use on your port.  IMPORTANT PORT INFORMATION  · Tell your caregiver if you are allergic to heparin.  · After your port is placed, you will get a manufacturer's information card. The card has information about your port. Keep this card with you at all times.  · There are many types of ports available. Know what kind of port you have.  · In case of an  emergency, it may be helpful to wear a medical alert bracelet. This can help alert health care workers that you have a port.  · The port can stay in for as long as your caregiver believes it is necessary.  · When it is time for the port to come out, surgery will be done to remove it. The surgery will be similar to how the port was put in.  · If you are in the hospital or clinic:  · Your port will be taken care of and flushed by a nurse.  · If you are at home:  · A home health care nurse may give medicines and take care of the port.  · You or a family member can get special training and directions for giving medicine and taking care of the port at home.  SEEK IMMEDIATE MEDICAL CARE IF:   · Your port does not flush or you are unable to get a blood return.  · New drainage or pus is coming from the incision.  · A bad smell is coming from the incision site.  · You develop swelling or increased redness at the incision site.  · You develop increased swelling or pain at the port site.  · You develop swelling or pain in the surrounding skin near the port.  · You have an oral temperature above 102° F (38.9° C), not controlled by medicine.  MAKE SURE YOU:   · Understand these instructions.  · Will watch your condition.  · Will get help right away if you are not doing well or get worse.  Document Released: 08/15/2005 Document Revised: 11/07/2011 Document Reviewed: 11/06/2008  [employer]® Patient Information ©2014 [employer].

## 2013-06-17 NOTE — Telephone Encounter (Signed)
appts made and printed. Pt is aware that cs will call her with appt for her CT'S and PET...td

## 2013-06-17 NOTE — Progress Notes (Signed)
ID: Jeanette Morris   DOB: Jan 04, 1946  MR#: [id number]  WUJ#:[id number]  PCP: Egbert Garibaldi, NP GYN:  SUFelicity Pellegrini OTHER MD: Arvilla Meres, Antony Blackbird, Rexanne Mano   HISTORY OF PRESENT ILLNESS: The patient undergoes annual screening mammography.  Her mammograms previously had fullness.  In August 2011 she presented with some firmness of her right breast. She also noted some skin thickening around her nipple and some tenderness.  She was subsequently seen for this on 05/13/2010 and was referred for bilateral mammogram and right breast ultrasound.  There was concern that this may be mastitis.  There were diffuse skin thickening, tissue edema, enlarged lymph node in right axilla, some subareolar distortion secondary to the previous surgery.  She was given a course of doxycycline for 10 days and asked to return. A follow-up ultrasound of the breast showed persistent abnormalities.  She was referred for biopsy.  She underwent biopsy of the right axilla on 05/27/2010 and it showed metastatic carcinoma consistent with breast primary.  This was ER and PR positive, HER-2 was positive with a ratio of 3.09.  The ER ratio is 34%, PR was 64%, proliferative index 60%.  She subsequently had a MRI scan of both breasts on 06/03/2010 and this showed a area of minimal enhancement in the right breast measuring 10.0 x 6.0 x 3.0 cm.  Enlarged lymph nodes were seen in the right axilla.  Largest node measuring 3.0 x 1.0 x 7.0 cm.  Intramammary lymph nodes were also seen in the outer left breast, the largest measuring 2.8 x 2.5 cm.  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 The patient's subsequent history is as detailed below.  INTERVAL HISTORY: Jeanette Morris returns today accompanied by her husband [name] for followup of her metastatic breast cancer. After her last visit here she met with Dr. Alva Garnet, and he felt she will benefit from post mastectomy radiation  on the left. He is scheduled to see her again November 6 to begin to operationalized that decision. In the meantime she has been to physical therapy for significant left upper extremity lymphedema. She tells that her therapists feel she cannot get her right arm wrapped and eventually put into a sleeve because then she would not be able to use either of, the left arm and being limited in both and range of motion and activity. Accordingly physical therapy is on hold until she completes her radiation treatments.  REVIEW OF SYSTEMS: Jeanette Morris is tolerating the tamoxifen with no side effects that she is aware of, and particularly no hot flashes or vaginal dryness or wetness problems. She has significant pain in her central and left chest, which feels superficially tight. The range of motion of the left upper extremity is very limited and there is significant left upper extremity lymphedema. She is taking Percocet 3-4 times a day with moderate control. She is not constipated from this medication. She tells me her appetite is poor. She has a little bit of ringing in her years, but no visual changes, nausea, vomiting, dizziness, or problems with gait imbalance or falls. There have been no palpitations or angina symptoms and she denies worsening shortness of breath, pleurisy, or hemoptysis. A detailed review of systems is otherwise stable  PAST MEDICAL HISTORY: Past Medical History  Diagnosis Date  . Hypertension   . Hearing loss     right ear  . Hyperlipidemia   . Arthritis   . Dysrhythmia  A fib with IV chemo treatments  . Shortness of breath     with exertion  . Tuberculosis     years ago-had 1 year tx  . 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 Breast cancer     (Rt) breast ca dx 9/11  . 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 Neuropathy   . History of radiation therapy 01/18/11-03/07/11    5940 cGy to Right chest wall, axilla and supraclavicular region    PAST SURGICAL HISTORY: Past Surgical History   Procedure Laterality Date  . Tubal ligation    . Portacath placement Left   . Abdominal hysterectomy    . Lung biopsy    . Lesion excision      L anterior thigh  . Breast surgery  12/01/10    ER+,PR+,Ki 67 64%,Her2 -; right breast lumpectomy  . Chest tube insertion  08/08/2012    Procedure: INSERTION PLEURAL DRAINAGE CATHETER;  Surgeon: Alleen Borne, MD;  Location: [facility] OR;  Service: Thoracic;  Laterality: Right;  . Removal of pleural drainage catheter Right 10/10/2012    Procedure: REMOVAL OF PLEURAL DRAINAGE CATHETER;  Surgeon: Alleen Borne, MD;  Location: [facility] OR;  Service: Thoracic;  Laterality: Right;  . Mastectomy, radical Left 05/08/2013    Dr Carolynne Edouard  . Mastectomy modified radical Left 05/08/2013    Procedure: MASTECTOMY MODIFIED RADICAL;  Surgeon: Robyne Askew, MD;  Location: [facility] OR;  Service: General;  Laterality: Left;    FAMILY HISTORY No family history on file. The patient's father died in an automobile accident at age 81. The patient's mother died at age 43 with Alzheimer's disease. The patient had 8 brothers and 4 sisters. There is no history of breast or ovarian cancer in the family.  GYNECOLOGIC HISTORY: Menarche age 36, first live birth age 72. She is GX P7. She underwent menopause "more than 20 years ago". She never took hormone replacement.  SOCIAL HISTORY: She has been mostly a housewife. Her husband [name] used to be a cook at [employer]. He completed radiation therapy for prostate cancer under [facility] April of 2014. The patient's children are from an earlier marriage, and include Kerri Perches, [place], [address], [address], Danielle [place], [place], and [employer]. The patient has 15 grandchildren and 1 great-grandchild. She attends [employer]  ADVANCED DIRECTIVES: Not in place  HEALTH MAINTENANCE: History  Substance Use Topics  . Smoking status: Former Smoker    Quit date: 06/30/1973  . Smokeless tobacco: Never  Used  . Alcohol Use: No     Colonoscopy:  PAP:  Bone density:  Lipid panel:  No Known Allergies  Current Outpatient Prescriptions  Medication Sig Dispense Refill  . Calcium Carbonate-Vitamin D 600-400 MG-UNIT per tablet Take 1 tablet by mouth every morning.       . carvedilol (COREG) 12.5 MG tablet Take 1 tablet (12.5 mg total) by mouth 2 (two) times daily with a meal. Take With 6.25 mg tablet to make a total strength of 18.75mg       . carvedilol (COREG) 6.25 MG tablet Take 6.25 mg by mouth daily. Takes with a 12.5 mg dose to total 18.75 mg daily      . cyanocobalamin 500 MCG tablet Take 500 mcg by mouth daily.      . ferrous sulfate 325 (65 FE) MG tablet Take 1 tablet (325 mg total) by mouth daily with breakfast.  30 tablet  1  .  gabapentin (NEURONTIN) 300 MG capsule Take 300 mg by mouth at bedtime.       Marland Kitchen oxyCODONE-acetaminophen (PERCOCET/ROXICET) 5-325 MG per tablet Take 1 tablet by mouth every 4 (four) hours as needed for pain.  60 tablet  0  . spironolactone (ALDACTONE) 25 MG tablet Take 1 tablet (25 mg total) by mouth daily.  30 tablet  6  . tamoxifen (NOLVADEX) 20 MG tablet Take 1 tablet (20 mg total) by mouth daily.  90 tablet  4   No current facility-administered medications for this visit.    OBJECTIVE:  African American woman who appears stated age 67 Vitals:   06/17/13 1327  BP: 151/84  Pulse: 82  Temp: 98.3 F (36.8 C)  Resp: 20   Filed Weights   06/17/13 1327  Weight: 236 lb (107.049 kg)      Body mass index is 39.27 kg/(m^2).    ECOG FS: 2  Sclerae unicteric, pupils equal round and reactive Oropharynx shows no thrush or other lesions No cervical adenopathy. There is hard adenopathy noted both supraclavicular areas. This measures approximately 2 cm bilaterally. Lungs no rales or rhonchi, fair excursion bilaterally Heart regular rate and rhythm, no murmur appreciated Abdomen  obese, soft, nontender, positive bowel sounds  MSK no focal spinal  tenderness Right upper extremity lymphedema, stable,  Left upper extremity lymphedema, 2+, no erythema noted Neuro: nonfocal, well oriented, appropriate affect Breasts: The right breast is status post mastectomy. There is no evidence of local recurrence. The right axilla is benign. On the left, the patient is status post mastectomy. The incision has healed very nicely, although it is quite irregular. I do not palpate any left axillary adenopathy. There is no evidence of skin involvement across the chest on either side Port is intact in the left upper chest wall    LAB RESULTS: Lab Results  Component Value Date   WBC 4.4 05/09/2013   NEUTROABS 2.2 04/11/2013   HGB 8.7* 05/09/2013   HCT 26.8* 05/09/2013   MCV 91.8 05/09/2013   PLT 174 05/09/2013      Chemistry      Component Value Date/Time   NA 135 05/09/2013 0920   NA 140 04/11/2013 0915   K 4.4 05/09/2013 0920   K 4.7 04/11/2013 0915   CL 103 05/09/2013 0920   CL 106 02/14/2013 1048   CO2 26 05/09/2013 0920   CO2 27 04/11/2013 0915   BUN 17 05/09/2013 0920   BUN 18.9 04/11/2013 0915   CREATININE 1.17* 05/09/2013 0920   CREATININE 1.2* 04/11/2013 0915      Component Value Date/Time   CALCIUM 8.4 05/09/2013 0920   CALCIUM 9.6 04/11/2013 0915   ALKPHOS 74 04/11/2013 0915   ALKPHOS 91 08/08/2012 0658   AST 21 04/11/2013 0915   AST 86* 08/08/2012 0658   ALT 12 04/11/2013 0915   ALT 117* 08/08/2012 0658   BILITOT 0.44 04/11/2013 0915   BILITOT 0.3 08/08/2012 0658       Lab Results  Component Value Date   LABCA2 95* 08/14/2012    STUDIES: Dg Chest 2 View  05/28/2013   CLINICAL DATA:  Metastatic breast cancer. Left mastectomy 2 weeks ago  EXAM: CHEST  2 VIEW  COMPARISON:  03/28/2013  FINDINGS: Postop left mastectomy. Skin staples in place. Soft tissue drain in place on the left.  Port-A-Cath tip in the SVC. The catheter tip may be against the lateral wall of the SVC.  Cardiac enlargement without heart failure. Small bilateral pleural  effusions. Negative for pneumonia.  Porcelain gallbladder again noted.  IMPRESSION: Small bilateral effusions. Negative for pneumonia or edema.   Electronically Signed   By: Marlan Palau M.D.   On: 05/28/2013 09:17   ASSESSMENT: 67 y.o. [place] woman with stage 4 breast cancer  (1) status post RIGHT axillary lymph node biopsy 05/27/2010 for a clinical T4 N1, stage IIIB (inflammatory) invasive ductal carcinoma, grade not stated, estrogen receptor 94% positive, progesterone receptor 64% positive, with an MIB-1 of 60%, and HER-2 amplification by FISH with a ratio of 7.5.  (2) neoadjuvant chemotherapy consisted of 6 cycles of standard carboplatin, docetaxel, trastuzumab, with the trastuzumab held cycles 5 and 6 due to a drop in her borderline ejection fraction (from 45-50% at baseline to 40-45% January 2012)-- total trastuzumab treatment = 2 months  (3) status post right mastectomy and axillary lymph node dissection 12/01/2010, showing no residual invasive carcinoma in the breast, but residual tumor in 20 of 21 axillary lymph nodes. (Only one of the 20 positive lymph nodes showed a significant residual tumor, measuring 5 mm; the remaining lymph nodes, including the evidence of extracapsular extension, showed single and clusters a few neoplastic cells with extensive therapy related changes).  (4) postmastectomy radiation to the right chest wall, axilla, and supraclavicular region completed 03/07/2011  (5) additional 2 months of trastuzumab given February through April 2013, again discontinued because of a drop in the ejection fraction and lateral S'  (6) on letrozole July 2012 to September 2013  (7) LEFT axillary adenopathy noted by Dr. Carolynne Edouard on exam July 2013, with biopsy of a left axillary node 04/17/2012 showing an invasive ductal carcinoma, estrogen receptor 91% positive, progesterone receptor 9% positive, with an MIB-1 of 45%, and no HER-2 amplification.   (8) PET scan 04/03/2012 showed  multiple enlarged left axillary and small left supraclavicular lymph nodes that are moderately hypermetabolic, with some hypermetabolic activity within the left axillary tail. No other hypermetabolic lymph nodes identified. There was no involvement of the right axilla, mediastinum or right chest wall and no extrathoracic metastases were demonstrated.  (8) on exemestane and everolimus September through December 2013  (9) s/p RIGHT thoracentesis 08/01/2012, cytologically positive; s/p temporary Pleurx; effusion resolved after Abraxane therapy  (10) Abraxane started 08/16/2012, given day 1 and day 8 of each 21 day cycle, completed 6 cycles (12 doses) 12/06/2012, with excellent response  (11) fulvestrant started 12/19/2012, stopped 04/11/2013 with progression  (12) trastuzumab resumed 12/21/2011. We scheduled the doses 4 weeks apart to see if they caould be better tolerated. However echo 02/04/2013 again showed a drop in the EF and S', so Herceptin stopped (last dose 01/17/2013). Most recent echo, 04/18/2013, shows the ejection fraction to have recovered.  (13) status post left modified radical mastectomy 05/08/2013 for a pT4, pN2a [tumor extended over 15 cm, all 6 sampled lymph nodes were involved) invasive lobular breast cancer, grade 3, which was HER-2 negative. It was estrogen receptor positive at 72%, progesterone receptor negative. Posterior margin was broadly positive  (14) tamoxifen started 05/29/2013  (15) post-mastectomy irradiation to left chest wall and left Conner fossa planned  PLAN:  Ed's main problem right now is her poorly controlled pain. I told her to go ahead and increase her Percocet to 4 times daily and she may take a double dose once or twice a day. If she needs to take up to 8 tablets a day on a regular basis then we will start her on longer acting formulations. In the meantime if she  starts getting hard bowel movements are otherwise constipated she knows to start MiraLAX and  stool softener. This has not been a problem yet.  She will need physical therapy when she can tolerated and referral is already in place. The plan is for her to see Dr. Alva Garnet November 6. She will see [place] that day as well and before that visit she will have a restaging CT scan of the neck and chest and a PET scan. The overall plan at this point is to proceed to left chest wall and supraclavicular radiation while continuing the tamoxifen.  She had a port flush today and that needs to be continued every 6 weeks, as she will likely need to resume some form of radiation at some point in the future. Otherwise [place] knows to call us for any problems that may develop before her next visit here. She has a good understanding of the overall plan is very much in agreement with that. Jeanette Morris C    06/17/2013

## 2013-06-18 ENCOUNTER — Ambulatory Visit (INDEPENDENT_AMBULATORY_CARE_PROVIDER_SITE_OTHER): Payer: PRIVATE HEALTH INSURANCE | Admitting: General Surgery

## 2013-06-18 ENCOUNTER — Encounter (INDEPENDENT_AMBULATORY_CARE_PROVIDER_SITE_OTHER): Payer: Self-pay | Admitting: General Surgery

## 2013-06-18 VITALS — BP 138/82 | HR 70 | Temp 97.8°F | Resp 16 | Ht 64.0 in | Wt 235.2 lb

## 2013-06-18 DIAGNOSIS — C50419 Malignant neoplasm of upper-outer quadrant of unspecified female breast: Secondary | ICD-10-CM

## 2013-06-18 DIAGNOSIS — C50412 Malignant neoplasm of upper-outer quadrant of left female breast: Secondary | ICD-10-CM

## 2013-06-18 NOTE — Progress Notes (Signed)
Subjective:     Patient ID: Jeanette Morris, female   DOB: 1946-07-02, 67 y.o.   MRN: [id number]  HPI The patient is a 67 year old white female who is one month status post left modified radical mastectomy for metastatic breast cancer. She now has fairly minimal drainage on the remaining drain along the left chest wall. She continues to have some soreness of the chest wall. She continues to have swelling of the left arm.  Review of Systems     Objective:   Physical Exam On exam her mastectomy incision is healing nicely. There is no evidence of seroma. She has a stable small hematoma underneath the upper flap which has not changed. Her skin flaps are healthy. Her drain was removed without difficulty.    Assessment:     The patient is one month status post left modified radical mastectomy for metastatic breast cancer     Plan:     At this point she will continue to followup with her medical and radiation oncologist. I will plan to see her back in the next couple weeks to check for any residual fluid.

## 2013-06-18 NOTE — Progress Notes (Signed)
Subjective:     Patient ID: Jeanette Morris, female   DOB: [birth year], 67 y.o.   MRN: [id number]  HPI The patient is a 67 year old black female who is about 5 weeks status post left modified radical mastectomy for metastatic breast cancer. She has had no residual fluid buildup since her last drain was removed. She is getting ready to start radiation therapy. She continues to have soreness of her chest wall and swelling of the left arm which is unchanged.  Review of Systems     Objective:   Physical Exam On exam her left mastectomy incision has healed nicely. Her skin flaps are viable. There is no residual seroma. The hematoma underneath the upper flap has gotten smaller. She does have a couple of small palpable nodules along the inferior and superior skin flaps which is most likely residual disease.    Assessment:     The patient is 5 weeks status post left modified radical mastectomy for metastatic breast cancer     Plan:     At this point she will continue with radiation therapy and chemotherapy. I will plan to see her back in about 2 months to check her progress. She will need to be fitted for compression sleeve of the left arm once the radiation is complete

## 2013-06-18 NOTE — Patient Instructions (Signed)
Continue radiation and chemotherapy

## 2013-06-28 NOTE — Progress Notes (Signed)
Location of Breast Cancer: left breast   Histology per Pathology Report:  05/08/13  Diagnosis  Breast, modified radical mastectomy , left  - INVASIVE LOBULAR CARCINOMA, GRADE III/III, SPANNING 15.0 CM.  - INVASIVE CARCINOMA IS BROADLY PRESENT AT THE POSTERIOR RESECTION MARGIN.  - LYMPHOVASCULAR INVASION IS IDENTIFIED.  - LOBULAR CARCINOMA IN SITU.  - CARCINOMA INVOLVES THE SKIN, INCLUDING DERMAL LYMPHATICS.  - METASTATIC CARCINOMA IN 6 OF 6 LYMPH NODES (6/6).   Receptor Status: ER(72 % positive), PR (negative), Her2-neu (negative)   Did patient present with symptoms (if so, please note symptoms) or was this found on screening mammography?:LEFT axillary adenopathy noted by Dr. Carolynne Edouard on exam July 2013   Past/Anticipated interventions by surgeon, if any: MASTECTOMY MODIFIED RADICAL; Surgeon: Robyne Askew, MD; Left; on 05/08/2013.   Past/Anticipated interventions by medical oncology, if any: fulvestrant started 12/19/2012, discontinued September 2014 with progression. Tamoxifen 20 mg daily, started 05/29/2013   Lymphedema issues, if any: left arm, hand since surgery, pt applies lymphedema sleeve at times   Pain issues, if any: Post op pain in left chest wall, axilla   SAFETY ISSUES:   Prior radiation? Yes -01/18/11-03/07/11 5940 cGy to Right chest wall, axilla and supraclavicular region  Pacemaker/ICD? no  Possible current pregnancy? no  Is the patient on methotrexate? No  Current Complaints / other details: married

## 2013-07-02 ENCOUNTER — Encounter (HOSPITAL_COMMUNITY)
Admission: RE | Admit: 2013-07-02 | Discharge: 2013-07-02 | Disposition: A | Discharge status: Home / Self Care | Payer: PRIVATE HEALTH INSURANCE | Source: Ambulatory Visit | Attending: Oncology | Admitting: Oncology

## 2013-07-02 ENCOUNTER — Ambulatory Visit (HOSPITAL_COMMUNITY)
Admission: RE | Admit: 2013-07-02 | Discharge: 2013-07-02 | Disposition: A | Discharge status: Home / Self Care | Payer: PRIVATE HEALTH INSURANCE | Source: Ambulatory Visit | Attending: Oncology | Admitting: Oncology

## 2013-07-02 DIAGNOSIS — C50411 Malignant neoplasm of upper-outer quadrant of right female breast: Secondary | ICD-10-CM

## 2013-07-02 DIAGNOSIS — R609 Edema, unspecified: Secondary | ICD-10-CM | POA: Insufficient documentation

## 2013-07-02 DIAGNOSIS — C50419 Malignant neoplasm of upper-outer quadrant of unspecified female breast: Secondary | ICD-10-CM | POA: Insufficient documentation

## 2013-07-02 DIAGNOSIS — J90 Pleural effusion, not elsewhere classified: Secondary | ICD-10-CM | POA: Insufficient documentation

## 2013-07-02 DIAGNOSIS — K828 Other specified diseases of gallbladder: Secondary | ICD-10-CM | POA: Insufficient documentation

## 2013-07-02 DIAGNOSIS — E049 Nontoxic goiter, unspecified: Secondary | ICD-10-CM | POA: Insufficient documentation

## 2013-07-02 DIAGNOSIS — C50912 Malignant neoplasm of unspecified site of left female breast: Secondary | ICD-10-CM

## 2013-07-02 DIAGNOSIS — C50919 Malignant neoplasm of unspecified site of unspecified female breast: Secondary | ICD-10-CM | POA: Insufficient documentation

## 2013-07-02 DIAGNOSIS — I2510 Atherosclerotic heart disease of native coronary artery without angina pectoris: Secondary | ICD-10-CM | POA: Insufficient documentation

## 2013-07-02 DIAGNOSIS — I700 Atherosclerosis of aorta: Secondary | ICD-10-CM | POA: Insufficient documentation

## 2013-07-02 DIAGNOSIS — Z79899 Other long term (current) drug therapy: Secondary | ICD-10-CM | POA: Insufficient documentation

## 2013-07-02 DIAGNOSIS — C7951 Secondary malignant neoplasm of bone: Secondary | ICD-10-CM | POA: Insufficient documentation

## 2013-07-02 MED ORDER — FLUDEOXYGLUCOSE F - 18 (FDG) INJECTION
15.7000 | Freq: Once | INTRAVENOUS | Status: AC | PRN
  Administered 2013-07-02: 15.7 via INTRAVENOUS

## 2013-07-02 MED ORDER — IOHEXOL 300 MG/ML  SOLN
80.0000 mL | Freq: Once | INTRAMUSCULAR | Status: AC | PRN
  Administered 2013-07-02: 80 mL via INTRAVENOUS

## 2013-07-03 ENCOUNTER — Other Ambulatory Visit: Payer: Self-pay | Admitting: Physician Assistant

## 2013-07-03 DIAGNOSIS — C50411 Malignant neoplasm of upper-outer quadrant of right female breast: Secondary | ICD-10-CM

## 2013-07-03 DIAGNOSIS — C50412 Malignant neoplasm of upper-outer quadrant of left female breast: Secondary | ICD-10-CM

## 2013-07-03 NOTE — Progress Notes (Signed)
Location of Breast Cancer: left breast   Histology per Pathology Report:  05/08/13  Diagnosis  Breast, modified radical mastectomy , left  - INVASIVE LOBULAR CARCINOMA, GRADE III/III, SPANNING 15.0 CM.  - INVASIVE CARCINOMA IS BROADLY PRESENT AT THE POSTERIOR RESECTION MARGIN.  - LYMPHOVASCULAR INVASION IS IDENTIFIED.  - LOBULAR CARCINOMA IN SITU.  - CARCINOMA INVOLVES THE SKIN, INCLUDING DERMAL LYMPHATICS.  - METASTATIC CARCINOMA IN 6 OF 6 LYMPH NODES (6/6).   Receptor Status: ER(72 % positive), PR (negative), Her2-neu (negative)   Did patient present with symptoms (if so, please note symptoms) or was this found on screening mammography?:LEFT axillary adenopathy noted by Dr. Carolynne Edouard on exam July 2013  Past/Anticipated interventions by surgeon, if any: MASTECTOMY MODIFIED RADICAL; Surgeon: Robyne Askew, MD; Left; on 05/08/2013.  Past/Anticipated interventions by medical oncology, if any: fulvestrant started 12/19/2012, discontinued September 2014 with progression. Tamoxifen 20 mg daily, started 05/29/2013  Lymphedema issues, if any: left arm, hand since surgery, pt applies lymphedema sleeve at times  Pain issues, if any: Post op pain in left chest wall, axilla   SAFETY ISSUES:  Prior radiation? Yes -01/18/11-03/07/11 5940 cGy to Right chest wall, axilla and supraclavicular region  Pacemaker/ICD? no  Possible current pregnancy? no  Is the patient on methotrexate? No  Current Complaints / other details: married, fatigued w/loss of appetite.  Return appt to assess improvement in lymphedema and arm mobility.

## 2013-07-04 ENCOUNTER — Encounter: Payer: Self-pay | Admitting: Radiation Oncology

## 2013-07-04 ENCOUNTER — Ambulatory Visit
Admission: RE | Admit: 2013-07-04 | Discharge: 2013-07-04 | Disposition: A | Discharge status: Home / Self Care | Payer: PRIVATE HEALTH INSURANCE | Source: Ambulatory Visit | Attending: Radiation Oncology | Admitting: Radiation Oncology

## 2013-07-04 ENCOUNTER — Other Ambulatory Visit (HOSPITAL_BASED_OUTPATIENT_CLINIC_OR_DEPARTMENT_OTHER): Payer: PRIVATE HEALTH INSURANCE | Admitting: Lab

## 2013-07-04 ENCOUNTER — Telehealth: Payer: Self-pay | Admitting: *Deleted

## 2013-07-04 ENCOUNTER — Encounter: Payer: Self-pay | Admitting: Physician Assistant

## 2013-07-04 ENCOUNTER — Ambulatory Visit (HOSPITAL_BASED_OUTPATIENT_CLINIC_OR_DEPARTMENT_OTHER): Payer: PRIVATE HEALTH INSURANCE | Admitting: Physician Assistant

## 2013-07-04 ENCOUNTER — Other Ambulatory Visit: Payer: Self-pay | Admitting: Oncology

## 2013-07-04 ENCOUNTER — Other Ambulatory Visit: Payer: Self-pay | Admitting: Physician Assistant

## 2013-07-04 VITALS — BP 141/88 | HR 84 | Temp 98.3°F | Resp 18 | Ht 64.0 in | Wt 237.6 lb

## 2013-07-04 VITALS — BP 182/81 | HR 74 | Temp 98.6°F | Ht 64.0 in | Wt 238.3 lb

## 2013-07-04 DIAGNOSIS — C773 Secondary and unspecified malignant neoplasm of axilla and upper limb lymph nodes: Secondary | ICD-10-CM

## 2013-07-04 DIAGNOSIS — Z9010 Acquired absence of unspecified breast and nipple: Secondary | ICD-10-CM | POA: Insufficient documentation

## 2013-07-04 DIAGNOSIS — C50919 Malignant neoplasm of unspecified site of unspecified female breast: Secondary | ICD-10-CM

## 2013-07-04 DIAGNOSIS — I890 Lymphedema, not elsewhere classified: Secondary | ICD-10-CM

## 2013-07-04 DIAGNOSIS — Z170 Estrogen receptor positive status [ER+]: Secondary | ICD-10-CM

## 2013-07-04 DIAGNOSIS — D630 Anemia in neoplastic disease: Secondary | ICD-10-CM

## 2013-07-04 DIAGNOSIS — C50619 Malignant neoplasm of axillary tail of unspecified female breast: Secondary | ICD-10-CM

## 2013-07-04 DIAGNOSIS — Z923 Personal history of irradiation: Secondary | ICD-10-CM | POA: Insufficient documentation

## 2013-07-04 DIAGNOSIS — C50411 Malignant neoplasm of upper-outer quadrant of right female breast: Secondary | ICD-10-CM

## 2013-07-04 DIAGNOSIS — R0602 Shortness of breath: Secondary | ICD-10-CM | POA: Insufficient documentation

## 2013-07-04 DIAGNOSIS — C7951 Secondary malignant neoplasm of bone: Secondary | ICD-10-CM

## 2013-07-04 DIAGNOSIS — C50412 Malignant neoplasm of upper-outer quadrant of left female breast: Secondary | ICD-10-CM

## 2013-07-04 DIAGNOSIS — J910 Malignant pleural effusion: Secondary | ICD-10-CM

## 2013-07-04 DIAGNOSIS — R599 Enlarged lymph nodes, unspecified: Secondary | ICD-10-CM | POA: Insufficient documentation

## 2013-07-04 DIAGNOSIS — Z79899 Other long term (current) drug therapy: Secondary | ICD-10-CM | POA: Insufficient documentation

## 2013-07-04 DIAGNOSIS — D649 Anemia, unspecified: Secondary | ICD-10-CM

## 2013-07-04 DIAGNOSIS — N63 Unspecified lump in unspecified breast: Secondary | ICD-10-CM | POA: Insufficient documentation

## 2013-07-04 DIAGNOSIS — R748 Abnormal levels of other serum enzymes: Secondary | ICD-10-CM

## 2013-07-04 DIAGNOSIS — R0789 Other chest pain: Secondary | ICD-10-CM | POA: Insufficient documentation

## 2013-07-04 DIAGNOSIS — Z79810 Long term (current) use of selective estrogen receptor modulators (SERMs): Secondary | ICD-10-CM | POA: Insufficient documentation

## 2013-07-04 LAB — CBC WITH DIFFERENTIAL/PLATELET
BASO%: 0.7 % (ref 0.0–2.0)
EOS%: 2.2 % (ref 0.0–7.0)
HCT: 28.4 % — ABNORMAL LOW (ref 34.8–46.6)
LYMPH%: 31.6 % (ref 14.0–49.7)
MCH: 29.5 pg (ref 25.1–34.0)
MCHC: 32.3 g/dL (ref 31.5–36.0)
MCV: 91.5 fL (ref 79.5–101.0)
MONO%: 12 % (ref 0.0–14.0)
NEUT%: 53.5 % (ref 38.4–76.8)
Platelets: 215 10*3/uL (ref 145–400)
RBC: 3.1 10*6/uL — ABNORMAL LOW (ref 3.70–5.45)
WBC: 4.2 10*3/uL (ref 3.9–10.3)

## 2013-07-04 LAB — COMPREHENSIVE METABOLIC PANEL (CC13)
ALT: 60 U/L — ABNORMAL HIGH (ref 0–55)
Alkaline Phosphatase: 82 U/L (ref 40–150)
Anion Gap: 9 mEq/L (ref 3–11)
CO2: 25 mEq/L (ref 22–29)
Creatinine: 1.1 mg/dL (ref 0.6–1.1)
Sodium: 137 mEq/L (ref 136–145)
Total Bilirubin: 0.46 mg/dL (ref 0.20–1.20)
Total Protein: 7.9 g/dL (ref 6.4–8.3)

## 2013-07-04 NOTE — Telephone Encounter (Signed)
appts made and printed...td 

## 2013-07-04 NOTE — Progress Notes (Signed)
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 estrogen receptor positive at 72%, progesterone receptor negative.   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 The patient was seen on October 8. Postmastectomy irradiation was recommended along the left side in light of her high risk for local regional recurrence. At that time the patient was to begin physical therapy for left arm mobility and lymphedema. Apparently the patient did not follow through with this recommendation. She did undergo repeat imaging studies ordered by medical oncology.  on PET scan showed a recurrence along the left chest wall region left axillary region and lymph nodes along the supraclavicular region and neck area. This was also noted to have multifocal osseous metastatic disease and a likely malignant right pleural effusion.  Discussion today with medical oncology recommends that the patient will continue on tamoxifen for approximately 2 more months. Patient will likely require switching to CMF ( IV form) if she does not respond well to tamoxifen.                       ALLERGIES:  has No Known Allergies.  Meds: Current Outpatient Prescriptions  Medication Sig Dispense Refill  . Calcium Carbonate-Vitamin D 600-400 MG-UNIT per tablet Take 1 tablet by mouth every morning.       . carvedilol (COREG) 12.5 MG tablet Take 1 tablet (12.5 mg total) by mouth 2 (two) times daily with a meal. Take With 6.25 mg tablet to make a total strength of 18.75mg       . carvedilol (COREG) 6.25 MG tablet Take 6.25 mg by mouth daily. Takes with a 12.5 mg dose to total 18.75 mg  daily      . cyanocobalamin 500 MCG tablet Take 500 mcg by mouth daily.      . ferrous sulfate 325 (65 FE) MG tablet Take 1 tablet (325 mg total) by mouth daily with breakfast.  30 tablet  1  . gabapentin (NEURONTIN) 300 MG capsule Take 300 mg by mouth at bedtime.       Marland Kitchen oxyCODONE-acetaminophen (PERCOCET/ROXICET) 5-325 MG per tablet Take 1 tablet by mouth every 4 (four) hours as needed for pain.  120 tablet  0  . spironolactone (ALDACTONE) 25 MG tablet Take 1 tablet (25 mg total) by mouth daily.  30 tablet  6  . tamoxifen (NOLVADEX) 20 MG tablet Take 1 tablet (20 mg total) by mouth daily.  90 tablet  4   No current facility-administered medications for this encounter.    Physical Findings: The patient is in no acute distress. Patient is alert and oriented.  height is 5\' 4"  (1.626 m) and weight is 238 lb 4.8 oz (108.092 kg). Her temperature is 98.6 F (37 C). Her blood pressure is 182/81 and her pulse is 74. .  Bilateral palpable supraclavicular adenopathy more so along the left side significant induration along the left axillary area consistent with recurrence or severe lymphedema. Patient also has swelling above her mastectomy scar also consistent with recurrence. She continues to have significant lymphedema in the upper extremities more so along the left side.  Lab Findings:  Lab Results  Component Value Date   WBC 4.2 07/04/2013   HGB 9.2* 07/04/2013   HCT 28.4* 07/04/2013   MCV 91.5 07/04/2013   PLT 215 07/04/2013     ddCt Chest W Contrast  07/02/2013   CLINICAL DATA:  Restaging bilateral breast cancer diagnosed in 2011. Radiation therapy completed. Chemotherapy ongoing. Patient reports shortness of breath.  EXAM: CT CHEST WITH CONTRAST  TECHNIQUE: Multidetector CT imaging of the chest was performed during intravenous contrast administration.  CONTRAST:  80mL OMNIPAQUE IOHEXOL 300 MG/ML  SOLN  COMPARISON:  PET-CT today and chest CT 12/18/2012.  FINDINGS: Compared with the prior study,  there is new diffuse subcutaneous edema throughout the upper chest and visualized upper arms. There is new ill-defined nodularity in the left supraclavicular area, hypermetabolic on PET and worrisome for lymphadenopathy. A representative lesion measures 1.9 cm on image 1. There is also new nodularity anterior to the left pectoralis major muscle, measuring 1.7 cm on image 10, also hypermetabolic on PET. There is asymmetric nodular enlargement of the left pectoralis major muscle more inferiorly without well defined mass. Ill-defined increased nodal tissue is present within the left axilla. No right axillary masses are identified following lymph node dissection.  Asymmetric heterogeneous enlargement of the left thyroid lobe and resulting tracheal deviation to the right are stable, consistent with goiter. No enlarged mediastinal or hilar lymph nodes are identified. There is diffuse atherosclerosis of the aorta and coronary arteries.  There are enlarging moderate right greater than left pleural effusions with associated bibasilar atelectasis. No pulmonary nodules, endobronchial lesions or confluent airspace opacities are identified. There is minimal ground-glass opacity at the right apex measuring 10 mm on image 11.  Images through the upper abdomen again demonstrate extensive calcification throughout the gallbladder wall. No focal liver lesions are identified.  There is a lytic metastasis involving the left aspect of the T11 vertebral body. There are suspected metastases involving the upper right ribs  IMPRESSION: 1. Diffuse soft tissue edema throughout the upper chest and bilateral arms, likely in part secondary to radiation therapy. Superimposed lymphedema is possible. 2. Increasing ill-defined nodularity is noted in the left supraclavicular, left axillary and left anterior chest wall regions worrisome for metastatic disease given the associated hypermetabolic activity on today's PET-CT. 3. No mediastinal adenopathy  or pulmonary parenchymal metastases identified. 4. New bilateral pleural effusions and associated bibasilar atelectasis. The right effusion is potentially malignant as correlated with today's PET-CT. 5. Osseous metastases involving the upper right ribs and T11 vertebral body. 6. Grossly stable goiter and porcelain gallbladder.   Electronically Signed   By: Roxy Horseman M.D.   On: 07/02/2013 15:57   Nm Pet Image Restag (ps) Skull Base To Thigh  07/02/2013   CLINICAL DATA:  Subsequent treatment strategy for breast cancer. Restaging bilateral breast cancer diagnosed in 2011. Radiation therapy completed. Chemotherapy ongoing. Patient reports shortness of breath.  EXAM: NUCLEAR MEDICINE PET SKULL BASE TO THIGH  FASTING BLOOD GLUCOSE:  Value: 104mg /dl  TECHNIQUE: 16.1 mCi W-96 FDG was injected intravenously. CT data was obtained and used for attenuation correction and anatomic localization only. (This was not acquired as a diagnostic CT examination.) Additional exam technical data entered on technologist worksheet.  COMPARISON:  Chest CT performed today and PET-CT 04/03/2012.  FINDINGS: The examination is mildly limited by body habitus.  NECK  There are multiple new ill-defined hypermetabolic left cervical lymph nodes, confluent in the left supraclavicular area. SUV max is up to 12.6. There are smaller hypermetabolic supraclavicular  lymph nodes on the right. Asymmetric goiter is again noted without significantly increased metabolic activity or significant change.  CHEST  There is diffusely increased hypermetabolic activity in the left axilla corresponding with increased soft tissue density between the surgical clips worrisome for confluent lymphadenopathy. There is also hypermetabolic activity within the left pectoralis muscle which demonstrates ill-defined nodular enlargement. There are several subcutaneous nodules within the left anterior upper chest which are new and hypermetabolic. Hypermetabolic soft tissue  activity in the upper right arm is without definite corresponding nodal disease and may reflect myositis or physiologic muscular activity.  No discrete hypermetabolic mediastinal or hilar lymph nodes are identified. There is a new right pleural effusion with asymmetric hypermetabolic activity at the right apex, concerning for a malignant effusion. Some of this is likely due to underlying osseous metastatic disease in the ribs and upper thoracic spine.  ABDOMEN/PELVIS  Hepatic evaluation is limited by body habitus. No discrete hepatic metastases are identified. There is no evidence metastatic disease to the adrenal glands, spleen or pancreas. 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 There is ill-defined edema within the base of the mesentery with associated hypermetabolic activity. There is a hypermetabolic left external iliac node measuring 1.3 cm on image 179 (SUV max 9.4). No discrete enlarged abdominal lymph nodes or peritoneal nodules identified.  SKELETON  There is multifocal osseous activity consistent with metastatic disease. Asymmetric activity is present at the right lung apex, likely involving the upper right ribs and upper thoracic spine. There is a lytic hypermetabolic lesion involving the left aspect of the T11 vertebral body. Additional metastases are present within the lumbar spine and bony pelvis.  IMPRESSION: 1. Findings are consistent with extensive upper left chest wall recurrence with multiple hypermetabolic lymph nodes in the neck and left axilla. There is probable involvement of the left pectoralis musculature. 2. Multifocal osseous metastases involving the ribs, spine and pelvis. Spinal involvement is limited by body habitus. If the patient has focal neurologic symptoms, further evaluation with MRI may be helpful. 3. Probable malignant right pleural effusion. 4. Atypical central mesenteric hypermetabolic activity without obvious associated nodal mass. This could be due to superimposed  pancreatitis.   Electronically Signed   By: Roxy Horseman M.D.   On: 07/02/2013 15:54    Impression:  Invasive lobular carcinoma of the left breast. Patient has persistent/recurrent disease along the chest wall and nodal regions. Patient appears symptomatic from these areas with complaint of chest tightness. She's also having some shortness of breath which may be related to her pleural effusions.  Plan:  Proceed with planning for postmastectomy irradiation along the left side. This will be technically difficult in light of the patient's limited arm mobility related to tumor and lymphedema.  She may also require palliative radiation therapy directed at the lower thoracic spine (T11) if she becomes more symptomatic in this area.  _____________________________________ -----------------------------------  Billie Lade, PhD, MD

## 2013-07-04 NOTE — Progress Notes (Signed)
She c/o shortness of breath for the past 3 weeks when she "moves around". Her O 2 sat is 99% on RA after resting in chair.  She also c/o tightening in middle of her chest when this area is exposed to cold, therefore she places a wash cloth in her clothing to shield this area.  She has Marked Lymphedema in her bilateral upper extremities, the left worse than the right.  She has difficulty positioning when sleeping due to pain and has decreased ROM of motion bilaterally

## 2013-07-04 NOTE — Progress Notes (Signed)
ID: [name]   DOB: 08/29/1946  MR#: [id number]  CSN#:[id number]  PCP: Jeanette Garibaldi, NP GYN:  [name] OTHER MD: Jeanette Morris, Jeanette Morris, Jeanette Morris  CHIEF COMPLAINT:  Metastatic Breast Cancer   HISTORY OF PRESENT ILLNESS: The patient undergoes annual screening mammography.  Her mammograms previously had fullness.  In August 2011 she presented with some firmness of her right breast. She also noted some skin thickening around her nipple and some tenderness.  She was subsequently seen for this on 05/13/2010 and was referred for bilateral mammogram and right breast ultrasound.  There was concern that this may be mastitis.  There were diffuse skin thickening, tissue edema, enlarged lymph node in right axilla, some subareolar distortion secondary to the previous surgery.  She was given a course of doxycycline for 10 days and asked to return. A follow-up ultrasound of the breast showed persistent abnormalities.  She was referred for biopsy.  She underwent biopsy of the right axilla on 05/27/2010 and it showed metastatic carcinoma consistent with breast primary.  This was ER and PR positive, HER-2 was positive with a ratio of 3.09.  The ER ratio is 34%, PR was 64%, proliferative index 60%.  She subsequently had a MRI scan of both breasts on 06/03/2010 and this showed a area of minimal enhancement in the right breast measuring 10.0 x 6.0 x 3.0 cm.  Enlarged lymph nodes were seen in the right axilla.  Largest node measuring 3.0 x 1.0 x 7.0 cm.  Intramammary lymph nodes were also seen in the outer left breast, the largest measuring 2.8 x 2.5 cm.  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   The patient's subsequent history is as detailed below.  INTERVAL HISTORY: Jeanette Morris returns today accompanied by her husband Jeanette Morris for followup of her metastatic breast cancer. Jeanette Morris has been on tamoxifen since October 1, and is  tolerating the medication well. She's had no significant increase in hot flashes. She does have chronic pain, but this has not worsened since starting the tamoxifen. She's had no vaginal dryness, abnormal vaginal bleeding, or vaginal discharge. She also denies any abnormal clotting.   Interval history is notable for restaging scans earlier this week on 07/02/2013. These are detailed below, but basically showed evidence of disease progression. They were compared, however, with studies in August of 2013.  Jeanette Morris also met with Jeanette Morris earlier today to discuss her upcoming radiation therapy. She is scheduled for simulation next week on November 11 and anticipates approximately 6 weeks of radiation she tells me.   REVIEW OF SYSTEMS: Jeanette Morris has had no recent illnesses and denies any fevers or chills. Her energy level is low. She has shortness of breath with minimal exertion and finds that she has to rest frequently. She denies any cough, phlegm production, pleurisy, or hemoptysis. She sometimes feels tightness in her chest when she takes a deep breath, but this is stable. She's had no chest pain or palpitations. Her appetite has decreased, although she denies any actual nausea and has had no emesis or change in bowel or bladder habits. She's having regular bowel movements despite the use of pain medication. She's had no abnormal headaches, dizziness, change in vision, numbness in the extremities, or radiating pain and weakness in the extremities. She continues to have severe lymphedema bilaterally in the upper extremities. She cannot start physical therapy at the lymphedema clinic until  radiation therapy has  been completed. She has diffuse back pain, but primarily affects the thoracic region, and posterior right ribs. Between the pain, the recent surgery in the left chest wall, and the lymphedema, she finds it very difficult to position herself to sleep.    A detailed review of systems is otherwise  stable.  PAST MEDICAL HISTORY: Past Medical History  Diagnosis Date  . Hypertension   . Hearing loss     right ear  . Hyperlipidemia   . Arthritis   . Dysrhythmia     A fib with IV chemo treatments  . Shortness of breath     with exertion  . Tuberculosis     years ago-had 1 year tx  . 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 Breast cancer     (Rt) breast ca dx 9/11  . 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 Neuropathy   . History of radiation therapy 01/18/11-03/07/11    5940 cGy to Right chest wall, axilla and supraclavicular region    PAST SURGICAL HISTORY: Past Surgical History  Procedure Laterality Date  . Tubal ligation    . Portacath placement Left   . Abdominal hysterectomy    . Lung biopsy    . Lesion excision      L anterior thigh  . Breast surgery  12/01/10    ER+,PR+,Ki 67 64%,Her2 -; right breast lumpectomy  . Chest tube insertion  08/08/2012    Procedure: INSERTION PLEURAL DRAINAGE CATHETER;  Surgeon: Jeanette Borne, MD;  Location: [facility] OR;  Service: Thoracic;  Laterality: Right;  . Removal of pleural drainage catheter Right 10/10/2012    Procedure: REMOVAL OF PLEURAL DRAINAGE CATHETER;  Surgeon: Jeanette Borne, MD;  Location: [facility] OR;  Service: Thoracic;  Laterality: Right;  . Mastectomy, radical Left 05/08/2013    Jeanette Morris  . Mastectomy modified radical Left 05/08/2013    Procedure: MASTECTOMY MODIFIED RADICAL;  Surgeon: Jeanette Askew, MD;  Location: [facility] OR;  Service: General;  Laterality: Left;    FAMILY HISTORY No family history on file. The patient's father died in an automobile accident at age 43. The patient's mother died at age 8 with Alzheimer's disease. The patient had 8 brothers and 4 sisters. There is no history of breast or ovarian cancer in the family.  GYNECOLOGIC HISTORY: Menarche age 17, first live birth age 89. She is GX P7. She underwent menopause "more than 20 years ago". She never took hormone replacement.  SOCIAL HISTORY:  (Updated November  2014) She has been mostly a housewife. Her husband Jeanette Morris used to be a cook at [employer]. He completed radiation therapy for prostate cancer under [facility] April of 2014. The patient's children are from an earlier marriage, and include Kerri Perches, [place], [address], [address], Danielle Ludell, [place], and [employer]. The patient has 15 grandchildren and 1 great-grandchild. She attends [employer]  ADVANCED DIRECTIVES: Not in place  HEALTH MAINTENANCE: (Updated 07/04/2013) History  Substance Use Topics  . Smoking status: Former Smoker    Quit date: 06/30/1973  . Smokeless tobacco: Never Used  . Alcohol Use: No     Colonoscopy:  Not on file  PAP: Not on file  Bone density:  August 2012, Normal  Lipid panel: Not on file   No Known Allergies  Current Outpatient Prescriptions  Medication Sig Dispense Refill  . Calcium Carbonate-Vitamin D 600-400 MG-UNIT per tablet Take 1 tablet by mouth every morning.       Marland Kitchen  carvedilol (COREG) 12.5 MG tablet Take 1 tablet (12.5 mg total) by mouth 2 (two) times daily with a meal. Take With 6.25 mg tablet to make a total strength of 18.75mg       . carvedilol (COREG) 6.25 MG tablet Take 6.25 mg by mouth daily. Takes with a 12.5 mg dose to total 18.75 mg daily      . cyanocobalamin 500 MCG tablet Take 500 mcg by mouth daily.      . ferrous sulfate 325 (65 FE) MG tablet Take 1 tablet (325 mg total) by mouth daily with breakfast.  30 tablet  1  . gabapentin (NEURONTIN) 300 MG capsule Take 300 mg by mouth at bedtime.       Marland Kitchen oxyCODONE-acetaminophen (PERCOCET/ROXICET) 5-325 MG per tablet Take 1 tablet by mouth every 4 (four) hours as needed for pain.  120 tablet  0  . spironolactone (ALDACTONE) 25 MG tablet Take 1 tablet (25 mg total) by mouth daily.  30 tablet  6  . tamoxifen (NOLVADEX) 20 MG tablet Take 1 tablet (20 mg total) by mouth daily.  90 tablet  4   No current facility-administered medications for  this visit.    OBJECTIVE:  African American woman who appears tired but is in no acute distress Filed Vitals:   07/04/13 1459  BP: 141/88  Pulse: 84  Temp: 98.3 F (36.8 C)  Resp: 18     Body mass index is 40.76 kg/(m^2).    ECOG FS: 2  Filed Weights   07/04/13 1459  Weight: 237 lb 9.6 oz (107.775 kg)   Physical Exam: HEENT:  Sclerae anicteric.  Oropharynx clear. Poor dentition. No ulcerations or evidence of candidiasis. NODES:  There several palpable lymph nodes in the left cervical region, the largest of which is slightly less than 1 cm. There is bilateral supraclavicular lymphadenopathy, hard and non-movable.  BREAST EXAM:  Patient is status post bilateral mastectomies. There no obvious skin changes. The left mastectomy incision has healed well, and there is no evidence of infection. There are some small palpable subcutaneous nodules felt on the left chest wall, especially  inferior to the mastectomy incision.  These are flesh colored, with no erythema or hyperpigmentation noted. It is difficult to examine the axillary regions thoroughly due to body habitus and extreme lymphedema bilaterally. I was unable to palpate any axillary lymphadenopathy today. LUNGS:  Breath sounds are diminished bilaterally in the bases, more so on the right than the left. On the right there were also expiratory wheezes auscultated. There is dullness to percussion halfway up the right side. HEART:  Regular rate and rhythm.   ABDOMEN:  Soft, obese, nontender.  Positive bowel sounds.  MSK:  No focal spinal tenderness to palpation. Limited range of motion in the upper extremities EXTREMITIES:  2+ pitting lymphedema in the right and left upper extremities. No significant erythema noted. Nonpitting pedal edema noted bilaterally, equal bilaterally. Port is intact in the left upper chest wall with no erythema, edema, or evidence of cellulitis or infection. NEURO:  Nonfocal. Well oriented.  Positive affect.   LAB  RESULTS: Lab Results  Component Value Date   WBC 4.2 07/04/2013   NEUTROABS 2.2 07/04/2013   HGB 9.2* 07/04/2013   HCT 28.4* 07/04/2013   MCV 91.5 07/04/2013   PLT 215 07/04/2013      Chemistry      Component Value Date/Time   NA 137 07/04/2013 1444   NA 135 05/09/2013 0920   K 4.4 07/04/2013 1444  K 4.4 05/09/2013 0920   CL 103 05/09/2013 0920   CL 106 02/14/2013 1048   CO2 25 07/04/2013 1444   CO2 26 05/09/2013 0920   BUN 15.4 07/04/2013 1444   BUN 17 05/09/2013 0920   CREATININE 1.1 07/04/2013 1444   CREATININE 1.17* 05/09/2013 0920      Component Value Date/Time   CALCIUM 9.0 07/04/2013 1444   CALCIUM 8.4 05/09/2013 0920   ALKPHOS 82 07/04/2013 1444   ALKPHOS 91 08/08/2012 0658   AST 87* 07/04/2013 1444   AST 86* 08/08/2012 0658   ALT 60* 07/04/2013 1444   ALT 117* 08/08/2012 0658   BILITOT 0.46 07/04/2013 1444   BILITOT 0.3 08/08/2012 0658       Lab Results  Component Value Date   LABCA2 95* 08/14/2012    STUDIES: Ct Chest W Contrast  07/02/2013   CLINICAL DATA:  Restaging bilateral breast cancer diagnosed in 2011. Radiation therapy completed. Chemotherapy ongoing. Patient reports shortness of breath.  EXAM: CT CHEST WITH CONTRAST  TECHNIQUE: Multidetector CT imaging of the chest was performed during intravenous contrast administration.  CONTRAST:  80mL OMNIPAQUE IOHEXOL 300 MG/ML  SOLN  COMPARISON:  PET-CT today and chest CT 12/18/2012.  FINDINGS: Compared with the prior study, there is new diffuse subcutaneous edema throughout the upper chest and visualized upper arms. There is new ill-defined nodularity in the left supraclavicular area, hypermetabolic on PET and worrisome for lymphadenopathy. A representative lesion measures 1.9 cm on image 1. There is also new nodularity anterior to the left pectoralis major muscle, measuring 1.7 cm on image 10, also hypermetabolic on PET. There is asymmetric nodular enlargement of the left pectoralis major muscle more inferiorly without well  defined mass. Ill-defined increased nodal tissue is present within the left axilla. No right axillary masses are identified following lymph node dissection.  Asymmetric heterogeneous enlargement of the left thyroid lobe and resulting tracheal deviation to the right are stable, consistent with goiter. No enlarged mediastinal or hilar lymph nodes are identified. There is diffuse atherosclerosis of the aorta and coronary arteries.  There are enlarging moderate right greater than left pleural effusions with associated bibasilar atelectasis. No pulmonary nodules, endobronchial lesions or confluent airspace opacities are identified. There is minimal ground-glass opacity at the right apex measuring 10 mm on image 11.  Images through the upper abdomen again demonstrate extensive calcification throughout the gallbladder wall. No focal liver lesions are identified.  There is a lytic metastasis involving the left aspect of the T11 vertebral body. There are suspected metastases involving the upper right ribs  IMPRESSION: 1. Diffuse soft tissue edema throughout the upper chest and bilateral arms, likely in part secondary to radiation therapy. Superimposed lymphedema is possible. 2. Increasing ill-defined nodularity is noted in the left supraclavicular, left axillary and left anterior chest wall regions worrisome for metastatic disease given the associated hypermetabolic activity on today's PET-CT. 3. No mediastinal adenopathy or pulmonary parenchymal metastases identified. 4. New bilateral pleural effusions and associated bibasilar atelectasis. The right effusion is potentially malignant as correlated with today's PET-CT. 5. Osseous metastases involving the upper right ribs and T11 vertebral body. 6. Grossly stable goiter and porcelain gallbladder.   Electronically Signed   By: Jeanette Morris M.D.   On: 07/02/2013 15:57   Nm Pet Image Restag (ps) Skull Base To Thigh  07/02/2013   CLINICAL DATA:  Subsequent treatment strategy  for breast cancer. Restaging bilateral breast cancer diagnosed in 2011. Radiation therapy completed. Chemotherapy ongoing. Patient reports shortness of  breath.  EXAM: NUCLEAR MEDICINE PET SKULL BASE TO THIGH  FASTING BLOOD GLUCOSE:  Value: 104mg /dl  TECHNIQUE: 69.6 mCi E-95 FDG was injected intravenously. CT data was obtained and used for attenuation correction and anatomic localization only. (This was not acquired as a diagnostic CT examination.) Additional exam technical data entered on technologist worksheet.  COMPARISON:  Chest CT performed today and PET-CT 04/03/2012.  FINDINGS: The examination is mildly limited by body habitus.  NECK  There are multiple new ill-defined hypermetabolic left cervical lymph nodes, confluent in the left supraclavicular area. SUV max is up to 12.6. There are smaller hypermetabolic supraclavicular lymph nodes on the right. Asymmetric goiter is again noted without significantly increased metabolic activity or significant change.  CHEST  There is diffusely increased hypermetabolic activity in the left axilla corresponding with increased soft tissue density between the surgical clips worrisome for confluent lymphadenopathy. There is also hypermetabolic activity within the left pectoralis muscle which demonstrates ill-defined nodular enlargement. There are several subcutaneous nodules within the left anterior upper chest which are new and hypermetabolic. Hypermetabolic soft tissue activity in the upper right arm is without definite corresponding nodal disease and may reflect myositis or physiologic muscular activity.  No discrete hypermetabolic mediastinal or hilar lymph nodes are identified. There is a new right pleural effusion with asymmetric hypermetabolic activity at the right apex, concerning for a malignant effusion. Some of this is likely due to underlying osseous metastatic disease in the ribs and upper thoracic spine.  ABDOMEN/PELVIS  Hepatic evaluation is limited by body  habitus. No discrete hepatic metastases are identified. There is no evidence metastatic disease to the adrenal glands, spleen or pancreas. 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 There is ill-defined edema within the base of the mesentery with associated hypermetabolic activity. There is a hypermetabolic left external iliac node measuring 1.3 cm on image 179 (SUV max 9.4). No discrete enlarged abdominal lymph nodes or peritoneal nodules identified.  SKELETON  There is multifocal osseous activity consistent with metastatic disease. Asymmetric activity is present at the right lung apex, likely involving the upper right ribs and upper thoracic spine. There is a lytic hypermetabolic lesion involving the left aspect of the T11 vertebral body. Additional metastases are present within the lumbar spine and bony pelvis.  IMPRESSION: 1. Findings are consistent with extensive upper left chest wall recurrence with multiple hypermetabolic lymph nodes in the neck and left axilla. There is probable involvement of the left pectoralis musculature. 2. Multifocal osseous metastases involving the ribs, spine and pelvis. Spinal involvement is limited by body habitus. If the patient has focal neurologic symptoms, further evaluation with MRI may be helpful. 3. Probable malignant right pleural effusion. 4. Atypical central mesenteric hypermetabolic activity without obvious associated nodal mass. This could be due to superimposed pancreatitis.   Electronically Signed   By: Jeanette Morris M.D.   On: 07/02/2013 15:54    ASSESSMENT: 67 y.o. Jeanette Morris woman with stage 4 breast cancer  (1) status post RIGHT axillary lymph node biopsy 05/27/2010 for a clinical T4 N1, stage IIIB (inflammatory) invasive ductal carcinoma, grade not stated, estrogen receptor 94% positive, progesterone receptor 64% positive, with an MIB-1 of 60%, and HER-2 amplification by FISH with a ratio of 7.5.  (2) neoadjuvant chemotherapy consisted of 6 cycles of standard  carboplatin, docetaxel, trastuzumab, with the trastuzumab held cycles 5 and 6 due to a drop in her borderline ejection fraction (from 45-50% at baseline to 40-45% January 2012)-- total trastuzumab treatment = 2 months  (3) status post  right mastectomy and axillary lymph node dissection 12/01/2010, showing no residual invasive carcinoma in the breast, but residual tumor in 20 of 21 axillary lymph nodes. (Only one of the 20 positive lymph nodes showed a significant residual tumor, measuring 5 mm; the remaining lymph nodes, including the evidence of extracapsular extension, showed single and clusters a few neoplastic cells with extensive therapy related changes).  (4) postmastectomy radiation to the right chest wall, axilla, and supraclavicular region completed 03/07/2011  (5) additional 2 months of trastuzumab given February through April 2013, again discontinued because of a drop in the ejection fraction and lateral S'  (6) on letrozole July 2012 to September 2013  (7) LEFT axillary adenopathy noted by Jeanette. Carolynne Morris on exam July 2013, with biopsy of a left axillary node 04/17/2012 showing an invasive ductal carcinoma, estrogen receptor 91% positive, progesterone receptor 9% positive, with an MIB-1 of 45%, and no HER-2 amplification.   (8) PET scan 04/03/2012 showed multiple enlarged left axillary and small left supraclavicular lymph nodes that are moderately hypermetabolic, with some hypermetabolic activity within the left axillary tail. No other hypermetabolic lymph nodes identified. There was no involvement of the right axilla, mediastinum or right chest wall and no extrathoracic metastases were demonstrated.  (8) on exemestane and everolimus September through December 2013  (9) s/p RIGHT thoracentesis 08/01/2012, cytologically positive; s/p temporary Pleurx; effusion resolved after Abraxane therapy  (10) Abraxane started 08/16/2012, given day 1 and day 8 of each 21 day cycle, completed 6 cycles (12  doses) 12/06/2012, with excellent response  (11) fulvestrant started 12/19/2012, stopped 04/11/2013 with progression  (12) trastuzumab resumed 12/21/2011. We scheduled the doses 4 weeks apart to see if they caould be better tolerated. However echo 02/04/2013 again showed a drop in the EF and S', so Herceptin stopped (last dose 01/17/2013). Most recent echo, 04/18/2013, shows the ejection fraction to have recovered.  (13) status post left modified radical mastectomy 05/08/2013 for a pT4, pN2a [tumor extended over 15 cm, all 6 sampled lymph nodes were involved) invasive lobular breast cancer, grade 3, which was HER-2 negative. It was estrogen receptor positive at 72%, progesterone receptor negative. Posterior margin was broadly positive  (14) tamoxifen started 05/29/2013  (15) post-mastectomy irradiation to left chest wall and left [place] fossa planned, beginning 07/09/2013  (16) new baseline staging scans including a chest CT and PET scan were obtained 07/02/2013. These did showed definite progression of disease, but that is when compared to previous scans in August 2013.  PLAN:  Jeanette Morris reviewed this case, including Jeanette Morris's recent scans and her treatment history. She's only been on the tamoxifen for one month, and of course, as noted above, we are comparing her current scans with those obtained over one year ago.  He feels like it would be entirely reasonable to continue on her current treatment plan with tamoxifen for at least another 2 months before considering a change.  We will repeat scans in early January, and that will give [place] a much better idea of how she is actually responding to the tamoxifen.  If scans at that time do in fact show evidence of further progression, we will likely consider treatment with IV CMF.  In the meanwhile, she will initiate radiation therapy next week under the care of Jeanette Morris.  She'll return for labs in 2 weeks, primarily to follow her anemia. I will then see  her for labs and physical exam in 4 weeks. We will repeat her PET scan and chest CT in  early January, after which she'll be seeing Jeanette Morris to review the results and discuss her treatment plan.  Of note, due to Johnnetta's bilateral mastectomies and axillary lymph node removals, in addition to her severe lymphedema, we are unable to stick either arm for labs. She does have a port that functions well. Accordingly, we will schedule her for a flush appointment each time she needs labs, and will draw labs through the port itself.    I reviewed the above plan with Britta Mccreedy and Jeanette Morris, and also reviewed her recent scan results with them. They both voice understanding and and are very much in agreement with the above plan. They know to call with any changes or problems prior to her next appointment.   Mathius Birkeland PA-C     07/04/2013

## 2013-07-08 ENCOUNTER — Other Ambulatory Visit: Payer: Self-pay | Admitting: Radiation Oncology

## 2013-07-09 ENCOUNTER — Ambulatory Visit
Admission: RE | Admit: 2013-07-09 | Discharge: 2013-07-09 | Disposition: A | Discharge status: Home / Self Care | Payer: PRIVATE HEALTH INSURANCE | Source: Ambulatory Visit | Attending: Radiation Oncology | Admitting: Radiation Oncology

## 2013-07-09 DIAGNOSIS — R0789 Other chest pain: Secondary | ICD-10-CM | POA: Diagnosis not present

## 2013-07-09 DIAGNOSIS — Z510 Encounter for antineoplastic radiation therapy: Secondary | ICD-10-CM | POA: Insufficient documentation

## 2013-07-09 DIAGNOSIS — C773 Secondary and unspecified malignant neoplasm of axilla and upper limb lymph nodes: Secondary | ICD-10-CM | POA: Diagnosis not present

## 2013-07-09 DIAGNOSIS — J90 Pleural effusion, not elsewhere classified: Secondary | ICD-10-CM | POA: Diagnosis not present

## 2013-07-09 DIAGNOSIS — R609 Edema, unspecified: Secondary | ICD-10-CM | POA: Diagnosis not present

## 2013-07-09 DIAGNOSIS — C50919 Malignant neoplasm of unspecified site of unspecified female breast: Secondary | ICD-10-CM | POA: Insufficient documentation

## 2013-07-09 DIAGNOSIS — C50412 Malignant neoplasm of upper-outer quadrant of left female breast: Secondary | ICD-10-CM

## 2013-07-09 DIAGNOSIS — Z79899 Other long term (current) drug therapy: Secondary | ICD-10-CM | POA: Diagnosis not present

## 2013-07-09 DIAGNOSIS — M549 Dorsalgia, unspecified: Secondary | ICD-10-CM | POA: Insufficient documentation

## 2013-07-09 MED ORDER — OXYCODONE HCL ER 20 MG PO T12A: 20.0000 mg | EXTENDED_RELEASE_TABLET | Freq: Two times a day (BID) | ORAL | Status: DC

## 2013-07-11 NOTE — Progress Notes (Signed)
  Radiation Oncology         [phone] ________________________________  Name: Jeanette Morris MRN: [id number]  Date: 07/09/2013  DOB: 01-31-1946  SIMULATION AND TREATMENT PLANNING NOTE  DIAGNOSIS:  pT4, pN2a invasive lobular carcinoma of the left breast   NARRATIVE:  The patient was brought to the CT Simulation planning suite.  Identity was confirmed.  All relevant records and images related to the planned course of therapy were reviewed.  The patient freely provided informed written consent to proceed with treatment after reviewing the details related to the planned course of therapy. The consent form was witnessed and verified by the simulation staff.  Then, the patient was set-up in a stable reproducible  supine position for radiation therapy.  CT images were obtained.  Surface markings were placed.  The CT images were loaded into the planning software.  Then the target and avoidance structures were contoured.  Treatment planning then occurred.  The radiation prescription was entered and confirmed.  Then, I designed and supervised the construction of a total of 5 medically necessary complex treatment devices.  I have requested : 3D Simulation  I have requested a DVH of the following structures: heart, lungs, CTV.  I have ordered:dose calc.  PLAN:  The patient will receive 45 Gy in 25 fractions.  ________________________________  -----------------------------------  Billie Lade, PhD, MD

## 2013-07-15 DIAGNOSIS — Z510 Encounter for antineoplastic radiation therapy: Secondary | ICD-10-CM | POA: Diagnosis not present

## 2013-07-16 ENCOUNTER — Telehealth: Payer: Self-pay | Admitting: Oncology

## 2013-07-16 ENCOUNTER — Ambulatory Visit
Admission: RE | Admit: 2013-07-16 | Discharge: 2013-07-16 | Disposition: A | Discharge status: Home / Self Care | Payer: PRIVATE HEALTH INSURANCE | Source: Ambulatory Visit | Attending: Radiation Oncology | Admitting: Radiation Oncology

## 2013-07-16 DIAGNOSIS — Z510 Encounter for antineoplastic radiation therapy: Secondary | ICD-10-CM | POA: Diagnosis not present

## 2013-07-16 DIAGNOSIS — C50912 Malignant neoplasm of unspecified site of left female breast: Secondary | ICD-10-CM

## 2013-07-16 NOTE — Progress Notes (Signed)
  Radiation Oncology         [phone] ________________________________  Name: Jeanette Morris MRN: [id number]  Date: 07/16/2013  DOB: 14-Sep-1945  Simulation Verification Note  Status: outpatient  NARRATIVE: The patient was brought to the treatment unit and placed in the planned treatment position. The clinical setup was verified. Then port films were obtained and uploaded to the radiation oncology medical record software.  The treatment beams were carefully compared against the planned radiation fields. The position location and shape of the radiation fields was reviewed. They targeted volume of tissue appears to be appropriately covered by the radiation beams. Organs at risk appear to be excluded as planned.  Based on my personal review, I approved the simulation verification. The patient's treatment will proceed as planned.  -----------------------------------  Billie Lade, PhD, MD

## 2013-07-16 NOTE — Telephone Encounter (Signed)
Jeanette Morris called and said that she was not able to fill the prescription for oxycodone 20 mg because her insurance would not cover it.  She said she has 10 percocet tablets left.  She is asking if Dr. Roselind Messier would be able to give her a prescription instead for oxycodone 5 mg.  She said she takes 2 tablets three times a day.  Advised her that she would be called back after Dr. Roselind Messier was contacted.

## 2013-07-17 ENCOUNTER — Other Ambulatory Visit: Payer: Self-pay | Admitting: *Deleted

## 2013-07-17 ENCOUNTER — Telehealth: Payer: Self-pay | Admitting: Oncology

## 2013-07-17 ENCOUNTER — Ambulatory Visit
Admission: RE | Admit: 2013-07-17 | Discharge: 2013-07-17 | Disposition: A | Discharge status: Home / Self Care | Payer: PRIVATE HEALTH INSURANCE | Source: Ambulatory Visit | Attending: Radiation Oncology | Admitting: Radiation Oncology

## 2013-07-17 ENCOUNTER — Telehealth: Payer: Self-pay | Admitting: *Deleted

## 2013-07-17 DIAGNOSIS — I890 Lymphedema, not elsewhere classified: Secondary | ICD-10-CM

## 2013-07-17 DIAGNOSIS — Z510 Encounter for antineoplastic radiation therapy: Secondary | ICD-10-CM | POA: Diagnosis not present

## 2013-07-17 DIAGNOSIS — C50919 Malignant neoplasm of unspecified site of unspecified female breast: Secondary | ICD-10-CM

## 2013-07-17 MED ORDER — OXYCODONE-ACETAMINOPHEN 5-325 MG PO TABS: 1.0000 | ORAL_TABLET | ORAL | Status: DC | PRN

## 2013-07-17 NOTE — Telephone Encounter (Signed)
Patient in lobby waiting to see RT for set up of Radiation treatment, complains of the left arm pain and how she can not even do her own ABC exercises because of the pain at this time. Spoke with Dwaine Gale with Rehab, patient can be assessed and evaluated to see if any relief can be given during this time, no need to wait for Radiation to be completed. Orders/referral sent and patient notified.

## 2013-07-17 NOTE — Telephone Encounter (Signed)
Called [name] with Dr. Darrall Dears office to see if Jeanette Morris's percocet can be refilled today.  Let her know that she is taking 2 tablets three times a day.  She said she would talk to Dr. Darnelle Catalan.

## 2013-07-17 NOTE — Telephone Encounter (Signed)
Called Jeanette Morris back on 07/16/2013 to clarify which medication she needed refilled.  She said she needed the percocet refilled and takes 2 tablets three times a day.  She said one tablet does not help her pain.

## 2013-07-18 ENCOUNTER — Telehealth: Payer: Self-pay | Admitting: *Deleted

## 2013-07-18 ENCOUNTER — Ambulatory Visit: Payer: PRIVATE HEALTH INSURANCE

## 2013-07-18 ENCOUNTER — Ambulatory Visit (HOSPITAL_BASED_OUTPATIENT_CLINIC_OR_DEPARTMENT_OTHER): Payer: PRIVATE HEALTH INSURANCE

## 2013-07-18 ENCOUNTER — Ambulatory Visit
Admission: RE | Admit: 2013-07-18 | Discharge: 2013-07-18 | Disposition: A | Discharge status: Home / Self Care | Payer: PRIVATE HEALTH INSURANCE | Source: Ambulatory Visit | Attending: Radiation Oncology | Admitting: Radiation Oncology

## 2013-07-18 ENCOUNTER — Other Ambulatory Visit (HOSPITAL_BASED_OUTPATIENT_CLINIC_OR_DEPARTMENT_OTHER): Payer: PRIVATE HEALTH INSURANCE | Admitting: Lab

## 2013-07-18 VITALS — BP 189/95 | HR 93 | Temp 98.1°F

## 2013-07-18 DIAGNOSIS — C50619 Malignant neoplasm of axillary tail of unspecified female breast: Secondary | ICD-10-CM

## 2013-07-18 DIAGNOSIS — R748 Abnormal levels of other serum enzymes: Secondary | ICD-10-CM

## 2013-07-18 DIAGNOSIS — D630 Anemia in neoplastic disease: Secondary | ICD-10-CM

## 2013-07-18 DIAGNOSIS — C773 Secondary and unspecified malignant neoplasm of axilla and upper limb lymph nodes: Secondary | ICD-10-CM

## 2013-07-18 DIAGNOSIS — Z510 Encounter for antineoplastic radiation therapy: Secondary | ICD-10-CM | POA: Diagnosis not present

## 2013-07-18 DIAGNOSIS — Z452 Encounter for adjustment and management of vascular access device: Secondary | ICD-10-CM

## 2013-07-18 DIAGNOSIS — C50919 Malignant neoplasm of unspecified site of unspecified female breast: Secondary | ICD-10-CM

## 2013-07-18 LAB — CBC WITH DIFFERENTIAL/PLATELET
BASO%: 0.9 % (ref 0.0–2.0)
Basophils Absolute: 0 10*3/uL (ref 0.0–0.1)
Eosinophils Absolute: 0.1 10*3/uL (ref 0.0–0.5)
HCT: 28.3 % — ABNORMAL LOW (ref 34.8–46.6)
LYMPH%: 26.9 % (ref 14.0–49.7)
MCHC: 32.9 g/dL (ref 31.5–36.0)
MCV: 91.1 fL (ref 79.5–101.0)
MONO#: 0.6 10*3/uL (ref 0.1–0.9)
MONO%: 13.2 % (ref 0.0–14.0)
NEUT%: 57.3 % (ref 38.4–76.8)
Platelets: 214 10*3/uL (ref 145–400)
RBC: 3.11 10*6/uL — ABNORMAL LOW (ref 3.70–5.45)
RDW: 13.4 % (ref 11.2–14.5)
WBC: 4.6 10*3/uL (ref 3.9–10.3)
lymph#: 1.2 10*3/uL (ref 0.9–3.3)

## 2013-07-18 LAB — COMPREHENSIVE METABOLIC PANEL (CC13)
ALT: 49 U/L (ref 0–55)
Alkaline Phosphatase: 84 U/L (ref 40–150)
Anion Gap: 8 mEq/L (ref 3–11)
CO2: 24 mEq/L (ref 22–29)
Creatinine: 0.9 mg/dL (ref 0.6–1.1)
Glucose: 104 mg/dl (ref 70–140)
Total Bilirubin: 0.42 mg/dL (ref 0.20–1.20)

## 2013-07-18 MED ORDER — HEPARIN SOD (PORK) LOCK FLUSH 100 UNIT/ML IV SOLN
500.0000 [IU] | Freq: Once | INTRAVENOUS | Status: AC
  Administered 2013-07-18: 500 [IU] via INTRAVENOUS
  Filled 2013-07-18: qty 5

## 2013-07-18 MED ORDER — SODIUM CHLORIDE 0.9 % IJ SOLN
10.0000 mL | INTRAMUSCULAR | Status: DC | PRN
  Administered 2013-07-18: 10 mL via INTRAVENOUS
  Filled 2013-07-18: qty 10

## 2013-07-18 NOTE — Telephone Encounter (Signed)
Per PA-C request.Called patient with lab results. Pt verbalized understanding. No further concerns. Reminded patient of next appts.

## 2013-07-18 NOTE — Patient Instructions (Signed)
Implanted Port Instructions  An implanted port is a central line that has a round shape and is placed under the skin. It is used for long-term IV (intravenous) access for:  · Medicine.  · Fluids.  · Liquid nutrition, such as TPN (total parenteral nutrition).  · Blood samples.  Ports can be placed:  · In the chest area just below the collarbone (this is the most common place.)  · In the arms.  · In the belly (abdomen) area.  · In the legs.  PARTS OF THE PORT  A port has 2 main parts:  · The reservoir. The reservoir is round, disc-shaped, and will be a small, raised area under your skin.  · The reservoir is the part where a needle is inserted (accessed) to either give medicines or to draw blood.  · The catheter. The catheter is a long, slender tube that extends from the reservoir. The catheter is placed into a large vein.  · Medicine that is inserted into the reservoir goes into the catheter and then into the vein.  INSERTION OF THE PORT  · The port is surgically placed in either an operating room or in a procedural area (interventional radiology).  · Medicine may be given to help you relax during the procedure.  · The skin where the port will be inserted is numbed (local anesthetic).  · 1 or 2 small cuts (incisions) will be made in the skin to insert the port.  · The port can be used after it has been inserted.  INCISION SITE CARE  · The incision site may have small adhesive strips on it. This helps keep the incision site closed. Sometimes, no adhesive strips are placed. Instead of adhesive strips, a special kind of surgical glue is used to keep the incision closed.  · If adhesive strips were placed on the incision sites, do not take them off. They will fall off on their own.  · The incision site may be sore for 1 to 2 days. Pain medicine can help.  · Do not get the incision site wet. Bathe or shower as directed by your caregiver.  · The incision site should heal in 5 to 7 days. A small scar may form after the  incision has healed.  ACCESSING THE PORT  Special steps must be taken to access the port:  · Before the port is accessed, a numbing cream can be placed on the skin. This helps numb the skin over the port site.  · A sterile technique is used to access the port.  · The port is accessed with a needle. Only "non-coring" port needles should be used to access the port. Once the port is accessed, a blood return should be checked. This helps ensure the port is in the vein and is not clogged (clotted).  · If your caregiver believes your port should remain accessed, a clear (transparent) bandage will be placed over the needle site. The bandage and needle will need to be changed every week or as directed by your caregiver.  · Keep the bandage covering the needle clean and dry. Do not get it wet. Follow your caregiver's instructions on how to take a shower or bath when the port is accessed.  · If your port does not need to stay accessed, no bandage is needed over the port.  FLUSHING THE PORT  Flushing the port keeps it from getting clogged. How often the port is flushed depends on:  · If a   constant infusion is running. If a constant infusion is running, the port may not need to be flushed.  · If intermittent medicines are given.  · If the port is not being used.  For intermittent medicines:  · The port will need to be flushed:  · After medicines have been given.  · After blood has been drawn.  · As part of routine maintenance.  · A port is normally flushed with:  · Normal saline.  · Heparin.  · Follow your caregiver's advice on how often, how much, and the type of flush to use on your port.  IMPORTANT PORT INFORMATION  · Tell your caregiver if you are allergic to heparin.  · After your port is placed, you will get a manufacturer's information card. The card has information about your port. Keep this card with you at all times.  · There are many types of ports available. Know what kind of port you have.  · In case of an  emergency, it may be helpful to wear a medical alert bracelet. This can help alert health care workers that you have a port.  · The port can stay in for as long as your caregiver believes it is necessary.  · When it is time for the port to come out, surgery will be done to remove it. The surgery will be similar to how the port was put in.  · If you are in the hospital or clinic:  · Your port will be taken care of and flushed by a nurse.  · If you are at home:  · A home health care nurse may give medicines and take care of the port.  · You or a family member can get special training and directions for giving medicine and taking care of the port at home.  SEEK IMMEDIATE MEDICAL CARE IF:   · Your port does not flush or you are unable to get a blood return.  · New drainage or pus is coming from the incision.  · A bad smell is coming from the incision site.  · You develop swelling or increased redness at the incision site.  · You develop increased swelling or pain at the port site.  · You develop swelling or pain in the surrounding skin near the port.  · You have an oral temperature above 102° F (38.9° C), not controlled by medicine.  MAKE SURE YOU:   · Understand these instructions.  · Will watch your condition.  · Will get help right away if you are not doing well or get worse.  Document Released: 08/15/2005 Document Revised: 11/07/2011 Document Reviewed: 11/06/2008  [employer]® Patient Information ©2014 [employer].

## 2013-07-19 ENCOUNTER — Ambulatory Visit
Admission: RE | Admit: 2013-07-19 | Discharge: 2013-07-19 | Disposition: A | Discharge status: Home / Self Care | Payer: PRIVATE HEALTH INSURANCE | Source: Ambulatory Visit | Attending: Radiation Oncology | Admitting: Radiation Oncology

## 2013-07-19 VITALS — HR 83 | Temp 98.8°F | Ht 64.0 in | Wt 234.0 lb

## 2013-07-19 VITALS — BP 195/100 | HR 83 | Temp 98.8°F | Ht 64.0 in | Wt 234.9 lb

## 2013-07-19 DIAGNOSIS — C50412 Malignant neoplasm of upper-outer quadrant of left female breast: Secondary | ICD-10-CM

## 2013-07-19 DIAGNOSIS — Z510 Encounter for antineoplastic radiation therapy: Secondary | ICD-10-CM | POA: Diagnosis not present

## 2013-07-19 MED ORDER — RADIAPLEXRX EX GEL
Freq: Once | CUTANEOUS | Status: AC
  Administered 2013-07-19: 16:00:00 via TOPICAL

## 2013-07-19 MED ORDER — ALRA NON-METALLIC DEODORANT (RAD-ONC)
1.0000 "application " | Freq: Once | TOPICAL | Status: AC
  Administered 2013-07-19: 1 via TOPICAL

## 2013-07-19 NOTE — Progress Notes (Signed)
Patient here for shortness of breath.  She had had 3 fractionst to her left chest wall.  She is having pain in her right lower back that she is rating at a 7/10.  She is currently taking 1-2 percocet every 4 hours.  She reports shortness of breath with activity that has been going on for the last month.  She also states that she has to have her head elevated to sleep at night.  Her oxygen saturation on room air today is 100%.  She is concerned because the shortness of breath feels like last year when she was hospitalized.  Also gave patient the Radiation Therapy and You book and discussed the potential side effects including fatigue, pain and skin changes.  She was given Alra deoderant and Radiaplex gel.  She was instructed to apply the radiaplex gel to the treatment area twice a day after treatment and at bedtime.

## 2013-07-19 NOTE — Progress Notes (Signed)
Weekly Management Note Current Dose:  5.4 Gy  Projected Dose: 60.4 Gy   Narrative:  The patient presents for routine under treatment assessment.  CBCT/MVCT images/Port film x-rays were reviewed.  The chart was checked. She has pain in her back which is controlled with her pain medications that she takes every 4 hours. She sometimes goes 6 hours without taking the Percocet. Her shortness of breath is stable. She was wondering about a crane that her sister had used when she broke her arm to see if that would help out with her pain. Denies any cough or fevers. She is accompanied by her husband. She is concerned about her left arm and the pain that she has a day or in position for her treatment. She is being evaluated at physical therapy on Monday.  Physical Findings: Weight: 234 lb (106.142 kg). Alert and a wheelchair. No skin changes of the left chest wall. Impression:  The patient is tolerating radiation.  Plan:  Continue treatment as planned. We discussed that her pain control seems adequate that at this time. She had not noticed any changes over the past week. We discussed that the cream that her sister received is likely not a good option for her at this point. I encouraged her to let us know she feels her breathing symptoms are getting worse.

## 2013-07-21 ENCOUNTER — Ambulatory Visit (HOSPITAL_COMMUNITY)
Admission: RE | Admit: 2013-07-21 | Discharge: 2013-07-21 | Disposition: A | Discharge status: Home / Self Care | Payer: PRIVATE HEALTH INSURANCE | Source: Ambulatory Visit | Attending: Radiation Oncology | Admitting: Radiation Oncology

## 2013-07-21 ENCOUNTER — Ambulatory Visit
Admission: RE | Admit: 2013-07-21 | Discharge: 2013-07-21 | Disposition: A | Discharge status: Home / Self Care | Payer: PRIVATE HEALTH INSURANCE | Source: Ambulatory Visit | Attending: Radiation Oncology | Admitting: Radiation Oncology

## 2013-07-21 VITALS — BP 161/100 | HR 93 | Temp 98.1°F | Resp 20 | Wt 236.2 lb

## 2013-07-21 DIAGNOSIS — J90 Pleural effusion, not elsewhere classified: Secondary | ICD-10-CM

## 2013-07-21 DIAGNOSIS — C50912 Malignant neoplasm of unspecified site of left female breast: Secondary | ICD-10-CM

## 2013-07-21 DIAGNOSIS — Z510 Encounter for antineoplastic radiation therapy: Secondary | ICD-10-CM | POA: Diagnosis not present

## 2013-07-21 NOTE — Progress Notes (Signed)
Pt brought to nursing after radiation treatment his morning with c/o increasing SOB. She states she has been SOB for several weeks but feels this is worsening. She states she elevates her head at night to breath better while sleeping. She does not use O2. O2 sat today on room air 99%. Pt states she breathes easier if the room temperature is not too hot. She denies pain, cough. Pt requests to be seen.

## 2013-07-21 NOTE — Progress Notes (Signed)
[facility]    Radiation Oncology [address] [place]     Maryln Gottron, M.D. [place], [place] [address]               Billie Lade, M.D., Ph.D. Phone: [phone]      Molli Hazard A. Kathrynn Running, M.D. Fax: [phone]      Radene Gunning, M.D., Ph.D.         Lurline Hare, M.D.         Grayland Jack, M.D Weekly Treatment Management Note  Name: Jeanette Morris     MRN: [id number]        CSN: [id number] Date: 07/21/2013      DOB: 1946-03-25  CC: Egbert Garibaldi, NP         [name]    Status: Outpatient  Diagnosis: The primary encounter diagnosis was Pleural effusion on right. A diagnosis of Breast cancer, left was also pertinent to this visit.  Current Dose: 7.2 Gy  Current Fraction: 4  Planned Dose: 45 Gy  Narrative: [name] Vorhees was seen today for weekly treatment management. The chart was checked and port films  were reviewed. sHe patient asked to be seen today for complaints of worsening shortness of breath. The patient feels as if her breathing is like it was prior to her thoracentesis last year. She denies any significant cough or hemoptysis. Her breathing is worse when lying flat.   Review of patient's allergies indicates no known allergies. Current Outpatient Prescriptions  Medication Sig Dispense Refill  . Calcium Carbonate-Vitamin D 600-400 MG-UNIT per tablet Take 1 tablet by mouth every morning.       . carvedilol (COREG) 12.5 MG tablet Take 1 tablet (12.5 mg total) by mouth 2 (two) times daily with a meal. Take With 6.25 mg tablet to make a total strength of 18.75mg       . carvedilol (COREG) 6.25 MG tablet Take 6.25 mg by mouth daily. Takes with a 12.5 mg dose to total 18.75 mg daily      . cyanocobalamin 500 MCG tablet Take 500 mcg by mouth daily.      . ferrous sulfate 325 (65 FE) MG tablet Take 1 tablet (325 mg total) by mouth daily with breakfast.  30 tablet  1  . gabapentin (NEURONTIN) 300 MG capsule Take 300 mg by mouth at bedtime.       .  OxyCODONE (OXYCONTIN) 20 mg T12A 12 hr tablet Take 1 tablet (20 mg total) by mouth every 12 (twelve) hours.  60 tablet  0  . oxyCODONE-acetaminophen (PERCOCET/ROXICET) 5-325 MG per tablet Take 1 tablet by mouth every 4 (four) hours as needed.  120 tablet  0  . spironolactone (ALDACTONE) 25 MG tablet Take 1 tablet (25 mg total) by mouth daily.  30 tablet  6  . tamoxifen (NOLVADEX) 20 MG tablet Take 1 tablet (20 mg total) by mouth daily.  90 tablet  4   No current facility-administered medications for this encounter.   Labs:  Lab Results  Component Value Date   WBC 4.6 07/18/2013   HGB 9.3* 07/18/2013   HCT 28.3* 07/18/2013   MCV 91.1 07/18/2013   PLT 214 07/18/2013   Lab Results  Component Value Date   CREATININE 0.9 07/18/2013   BUN 14.9 07/18/2013   NA 136 07/18/2013   K 4.1 07/18/2013   CL 103 05/09/2013   CO2 24 07/18/2013   Lab Results  Component Value Date   ALT 49 07/18/2013  AST 62* 07/18/2013   BILITOT 0.42 07/18/2013    Physical Examination:  Filed Vitals:   07/21/13 0837  BP: 161/100  Pulse: 93  Temp: 98.1 F (36.7 C)  Resp: 20    Wt Readings from Last 3 Encounters:  07/21/13 236 lb 3.2 oz (107.14 kg)  07/19/13 234 lb (106.142 kg)  07/19/13 234 lb 14.4 oz (106.55 kg)    Mildly decreased breath sounds in both lower lung fields.   Oxygen saturation on room air is 99% Heart has regular rhythm and rate  Abdomen is soft and non tender with normal bowel sounds The left chest wall area shows no appreciable radiation reaction.  EXAM:  CHEST 2 VIEW  COMPARISON: May 28, 2013.  FINDINGS:  Stable cardiomediastinal silhouette. Surgical clips are noted in  both axillary regions. Left subclavian Port-A-Cath is unchanged in  position. Bilateral pleural effusions are noted which are slightly  increased compared to prior exam. No pneumothorax is noted.  IMPRESSION:  Mild bilateral pleural effusions are noted which are increased  compared to prior  exam.   Assessment:  Patient tolerating treatments well  Plan: Continue treatment per original radiation prescription. The patient may need a therapeutic thoracentesis if her breathing continues to get worse

## 2013-07-22 ENCOUNTER — Ambulatory Visit: Payer: PRIVATE HEALTH INSURANCE | Admitting: Physical Therapy

## 2013-07-22 ENCOUNTER — Other Ambulatory Visit: Payer: PRIVATE HEALTH INSURANCE | Admitting: Lab

## 2013-07-22 ENCOUNTER — Emergency Department (HOSPITAL_COMMUNITY): Payer: PRIVATE HEALTH INSURANCE

## 2013-07-22 ENCOUNTER — Encounter (HOSPITAL_COMMUNITY): Payer: Self-pay | Admitting: Emergency Medicine

## 2013-07-22 ENCOUNTER — Emergency Department (HOSPITAL_COMMUNITY)
Admission: EM | Admit: 2013-07-22 | Discharge: 2013-07-22 | Disposition: A | Discharge status: Home / Self Care | Payer: PRIVATE HEALTH INSURANCE | Attending: Emergency Medicine | Admitting: Emergency Medicine

## 2013-07-22 ENCOUNTER — Encounter: Payer: Self-pay | Admitting: Radiation Oncology

## 2013-07-22 ENCOUNTER — Ambulatory Visit: Payer: PRIVATE HEALTH INSURANCE

## 2013-07-22 DIAGNOSIS — Z8611 Personal history of tuberculosis: Secondary | ICD-10-CM | POA: Insufficient documentation

## 2013-07-22 DIAGNOSIS — J90 Pleural effusion, not elsewhere classified: Secondary | ICD-10-CM | POA: Insufficient documentation

## 2013-07-22 DIAGNOSIS — E785 Hyperlipidemia, unspecified: Secondary | ICD-10-CM | POA: Diagnosis not present

## 2013-07-22 DIAGNOSIS — R071 Chest pain on breathing: Secondary | ICD-10-CM | POA: Diagnosis not present

## 2013-07-22 DIAGNOSIS — Z87891 Personal history of nicotine dependence: Secondary | ICD-10-CM | POA: Insufficient documentation

## 2013-07-22 DIAGNOSIS — Z8739 Personal history of other diseases of the musculoskeletal system and connective tissue: Secondary | ICD-10-CM | POA: Insufficient documentation

## 2013-07-22 DIAGNOSIS — R0602 Shortness of breath: Secondary | ICD-10-CM | POA: Diagnosis present

## 2013-07-22 DIAGNOSIS — Z923 Personal history of irradiation: Secondary | ICD-10-CM | POA: Insufficient documentation

## 2013-07-22 DIAGNOSIS — Z79899 Other long term (current) drug therapy: Secondary | ICD-10-CM | POA: Insufficient documentation

## 2013-07-22 DIAGNOSIS — G589 Mononeuropathy, unspecified: Secondary | ICD-10-CM | POA: Insufficient documentation

## 2013-07-22 DIAGNOSIS — I4891 Unspecified atrial fibrillation: Secondary | ICD-10-CM | POA: Insufficient documentation

## 2013-07-22 DIAGNOSIS — R0789 Other chest pain: Secondary | ICD-10-CM

## 2013-07-22 DIAGNOSIS — I10 Essential (primary) hypertension: Secondary | ICD-10-CM | POA: Insufficient documentation

## 2013-07-22 DIAGNOSIS — Z85828 Personal history of other malignant neoplasm of skin: Secondary | ICD-10-CM | POA: Insufficient documentation

## 2013-07-22 DIAGNOSIS — C50919 Malignant neoplasm of unspecified site of unspecified female breast: Secondary | ICD-10-CM | POA: Insufficient documentation

## 2013-07-22 DIAGNOSIS — Z853 Personal history of malignant neoplasm of breast: Secondary | ICD-10-CM | POA: Insufficient documentation

## 2013-07-22 LAB — CBC WITH DIFFERENTIAL/PLATELET
Basophils Absolute: 0 10*3/uL (ref 0.0–0.1)
Basophils Relative: 0 % (ref 0–1)
Eosinophils Absolute: 0 10*3/uL (ref 0.0–0.7)
Eosinophils Relative: 1 % (ref 0–5)
HCT: 28.2 % — ABNORMAL LOW (ref 36.0–46.0)
Lymphocytes Relative: 26 % (ref 12–46)
MCH: 30 pg (ref 26.0–34.0)
MCHC: 33.3 g/dL (ref 30.0–36.0)
MCV: 90.1 fL (ref 78.0–100.0)
Monocytes Absolute: 0.5 10*3/uL (ref 0.1–1.0)
Monocytes Relative: 13 % — ABNORMAL HIGH (ref 3–12)
RBC: 3.13 MIL/uL — ABNORMAL LOW (ref 3.87–5.11)
RDW: 13.1 % (ref 11.5–15.5)
WBC: 4.2 10*3/uL (ref 4.0–10.5)

## 2013-07-22 LAB — COMPREHENSIVE METABOLIC PANEL
AST: 49 U/L — ABNORMAL HIGH (ref 0–37)
Albumin: 2.7 g/dL — ABNORMAL LOW (ref 3.5–5.2)
CO2: 24 mEq/L (ref 19–32)
Calcium: 9 mg/dL (ref 8.4–10.5)
Creatinine, Ser: 0.99 mg/dL (ref 0.50–1.10)
GFR calc Af Amer: 66 mL/min — ABNORMAL LOW (ref >90)
GFR calc non Af Amer: 57 mL/min — ABNORMAL LOW (ref >90)

## 2013-07-22 LAB — POCT I-STAT TROPONIN I

## 2013-07-22 MED ORDER — OXYCODONE HCL 5 MG PO TABS: 5.0000 mg | ORAL_TABLET | ORAL | Status: DC | PRN

## 2013-07-22 MED ORDER — IOHEXOL 350 MG/ML SOLN
100.0000 mL | Freq: Once | INTRAVENOUS | Status: AC | PRN
  Administered 2013-07-22: 100 mL via INTRAVENOUS

## 2013-07-22 MED ORDER — HYDROMORPHONE HCL PF 1 MG/ML IJ SOLN
1.0000 mg | Freq: Once | INTRAMUSCULAR | Status: AC
Start: 2013-07-22 — End: 2013-07-22
  Administered 2013-07-22: 1 mg via INTRAVENOUS
  Filled 2013-07-22: qty 1

## 2013-07-22 MED ORDER — MORPHINE SULFATE 4 MG/ML IJ SOLN
4.0000 mg | Freq: Once | INTRAMUSCULAR | Status: AC
  Administered 2013-07-22: 4 mg via INTRAVENOUS
  Filled 2013-07-22: qty 1

## 2013-07-22 MED ORDER — HEPARIN SOD (PORK) LOCK FLUSH 100 UNIT/ML IV SOLN
500.0000 [IU] | Freq: Once | INTRAVENOUS | Status: DC
  Filled 2013-07-22: qty 5

## 2013-07-22 MED ORDER — HYDRALAZINE HCL 20 MG/ML IJ SOLN
5.0000 mg | Freq: Once | INTRAMUSCULAR | Status: DC
  Filled 2013-07-22: qty 0.25

## 2013-07-22 NOTE — Progress Notes (Signed)
Patient just phoned stating she was not coming for her radiation treatment today.  She is having trouble breathing and is going to have her husband take her to the [facility] ED.  Notified nursing and L2.

## 2013-07-22 NOTE — ED Provider Notes (Signed)
CSN: [id number]     Arrival date & time 07/22/13  4098 History   First MD Initiated Contact with Patient 07/22/13 1028     Chief Complaint  Patient presents with  . Shortness of Breath   (Consider location/radiation/quality/duration/timing/severity/associated sxs/prior Treatment) Patient is a 67 y.o. female presenting with shortness of breath. The history is provided by the patient.  Shortness of Breath Severity:  Severe Onset quality:  Gradual Duration:  12 months Timing:  Constant Progression:  Worsening Chronicity:  Chronic Context: activity   Relieved by:  Nothing Worsened by:  Exertion, movement, deep breathing and coughing Ineffective treatments:  Position changes, rest and sitting up Associated symptoms: chest pain   Associated symptoms: no cough, no diaphoresis, no fever, no headaches, no hemoptysis, no sputum production, no vomiting and no wheezing   Chest pain:    Quality:  Aching, dull, sharp and stabbing (Located at the left axillaq above the ribs)   Severity:  Severe   Onset quality:  Gradual   Duration:  1 month   Timing:  Constant   Progression:  Worsening   Chronicity:  Chronic    Past Medical History  Diagnosis Date  . Hypertension   . Hearing loss     right ear  . Hyperlipidemia   . Arthritis   . Dysrhythmia     A fib with IV chemo treatments  . Shortness of breath     with exertion  . Tuberculosis     years ago-had 1 year tx  . 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 Breast cancer     (Rt) breast ca dx 9/11  . 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 Neuropathy   . History of radiation therapy 01/18/11-03/07/11    5940 cGy to Right chest wall, axilla and supraclavicular region   Past Surgical History  Procedure Laterality Date  . Tubal ligation    . Portacath placement Left   . Abdominal hysterectomy    . Lung biopsy    . Lesion excision      L anterior thigh  . Breast surgery  12/01/10    ER+,PR+,Ki 67 64%,Her2 -; right breast lumpectomy  .  Chest tube insertion  08/08/2012    Procedure: INSERTION PLEURAL DRAINAGE CATHETER;  Surgeon: Alleen Borne, MD;  Location: [facility] OR;  Service: Thoracic;  Laterality: Right;  . Removal of pleural drainage catheter Right 10/10/2012    Procedure: REMOVAL OF PLEURAL DRAINAGE CATHETER;  Surgeon: Alleen Borne, MD;  Location: [facility] OR;  Service: Thoracic;  Laterality: Right;  . Mastectomy, radical Left 05/08/2013    Dr Carolynne Edouard  . Mastectomy modified radical Left 05/08/2013    Procedure: MASTECTOMY MODIFIED RADICAL;  Surgeon: Robyne Askew, MD;  Location: [facility] OR;  Service: General;  Laterality: Left;   History reviewed. No pertinent family history. History  Substance Use Topics  . Smoking status: Former Smoker    Quit date: 06/30/1973  . Smokeless tobacco: Never Used  . Alcohol Use: No   OB History   Grav Para Term Preterm Abortions TAB SAB Ect Mult Living                 Review of Systems  Constitutional: Negative for fever and diaphoresis.  Respiratory: Positive for shortness of breath. Negative for cough, hemoptysis, sputum production and wheezing.   Cardiovascular: Positive for chest pain.  Gastrointestinal: Negative for vomiting.  Neurological: Negative for headaches.    Allergies  Review of patient's allergies indicates no known allergies.  Home Medications   Current Outpatient Rx  Name  Route  Sig  Dispense  Refill  . Calcium Carbonate-Vitamin D 600-400 MG-UNIT per tablet   Oral   Take 1 tablet by mouth every morning.          . carvedilol (COREG) 12.5 MG tablet   Oral   Take 12.5 mg by mouth 2 (two) times daily with a meal. She takes with a 6.25mg  tablet to equal her dose of 18.75mg .         . carvedilol (COREG) 6.25 MG tablet   Oral   Take 6.25 mg by mouth 2 (two) times daily with a meal. She takes with a 12.5 mg tablet to equal her dose of 18.75 mg.         . cyanocobalamin 500 MCG tablet   Oral   Take 500 mcg by mouth daily with breakfast.          . ferrous  sulfate 325 (65 FE) MG tablet   Oral   Take 1 tablet (325 mg total) by mouth daily with breakfast.   30 tablet   1   . gabapentin (NEURONTIN) 300 MG capsule   Oral   Take 300 mg by mouth at bedtime.          Marland Kitchen oxyCODONE-acetaminophen (PERCOCET/ROXICET) 5-325 MG per tablet   Oral   Take 2 tablets by mouth every 4 (four) hours as needed for severe pain (For pain.).         Marland Kitchen spironolactone (ALDACTONE) 25 MG tablet   Oral   Take 25 mg by mouth every morning.         . tamoxifen (NOLVADEX) 20 MG tablet   Oral   Take 20 mg by mouth every morning.         . OxyCODONE (OXYCONTIN) 20 mg T12A 12 hr tablet   Oral   Take 1 tablet (20 mg total) by mouth every 12 (twelve) hours.   60 tablet   0    BP 219/93  Pulse 89  Temp(Src) 98.3 F (36.8 C) (Oral)  Resp 16  SpO2 97% Physical Exam  Constitutional: She is oriented to person, place, and time. She appears well-nourished.  HENT:  Head: Normocephalic and atraumatic.  Mouth/Throat: Oropharynx is clear and moist.  Eyes: Conjunctivae and EOM are normal. Pupils are equal, round, and reactive to light.  Cardiovascular: Normal rate, regular rhythm, normal heart sounds and intact distal pulses.   Borderline tachycardic (90)  Pulmonary/Chest: No respiratory distress. She has no wheezes.  Poor effort 2/2 to chest pain. Decreased breath sounds bilaterally. Coarse breath sounds bil.  Abdominal: Soft. Bowel sounds are normal. She exhibits no distension and no mass. There is no tenderness. There is no rebound and no guarding.  Musculoskeletal: She exhibits no edema and no tenderness.  Neurological: She is alert and oriented to person, place, and time.  Psychiatric: She has a normal mood and affect. Her behavior is normal.    ED Course  Procedures (including critical care time) Labs Review Labs Reviewed  CBC WITH DIFFERENTIAL - Abnormal; Notable for the following:    RBC 3.13 (*)    Hemoglobin 9.4 (*)    HCT 28.2 (*)     Monocytes Relative 13 (*)    All other components within normal limits  COMPREHENSIVE METABOLIC PANEL - Abnormal; Notable for the following:    Sodium 132 (*)    Glucose, Bld  104 (*)    Albumin 2.7 (*)    AST 49 (*)    ALT 37 (*)    GFR calc non Af Amer 57 (*)    GFR calc Af Amer 66 (*)    All other components within normal limits  POCT I-STAT TROPONIN I   Imaging Review Dg Chest 2 View  07/22/2013   CLINICAL DATA:  Lymphedema in the arms.  EXAM: CHEST  2 VIEW  COMPARISON:  07/21/2013  FINDINGS: Low volume film. The cardio pericardial silhouette is enlarged. And vascular congestion noted without overt airspace pulmonary edema. Left Port-A-Cath remains in place with tip overlying the innominate vein confluence. The tip of the catheter is directed laterally against the vessel wall.  Right base atelectasis or infiltrate noted. Tiny bilateral pleural effusions are again evident. Telemetry leads overlie the chest.  IMPRESSION: No substantial interval change in exam.   Electronically Signed   By: Kennith Center M.D.   On: 07/22/2013 11:56   Dg Chest 2 View  07/21/2013   CLINICAL DATA:  Shortness of breath, right pleural effusion.  EXAM: CHEST  2 VIEW  COMPARISON:  May 28, 2013.  FINDINGS: Stable cardiomediastinal silhouette. Surgical clips are noted in both axillary regions. Left subclavian Port-A-Cath is unchanged in position. Bilateral pleural effusions are noted which are slightly increased compared to prior exam. No pneumothorax is noted.  IMPRESSION: Mild bilateral pleural effusions are noted which are increased compared to prior exam.   Electronically Signed   By: Roque Lias M.D.   On: 07/21/2013 09:20   Ct Angio Chest Pe W/cm &/or Wo Cm  07/22/2013   CLINICAL DATA:  Short of breath.  History of breast carcinoma.  EXAM: CT ANGIOGRAPHY CHEST WITH CONTRAST  TECHNIQUE: Multidetector CT imaging of the chest was performed using the standard protocol during bolus administration of  intravenous contrast. Multiplanar CT image reconstructions including MIPs were obtained to evaluate the vascular anatomy.  CONTRAST:  OMNIPAQUE IOHEXOL 350 MG/ML SOLN  COMPARISON:  12/18/2012  FINDINGS: The study is limited by some artifact from the arms across the posterior Chest and is somewhat some optimal bolus as well as motion which defects evaluation of the smaller segmental and subsegmental arteries. Allowing for this, there is no evidence of a pulmonary embolism.  There is heterogeneous enlargement of thyroid gland with substernal extension. This is predominantly of the left lobe. There are areas of low attenuation is well is calcification, all stable.  There is left chest wall skin thickening underlying subcutaneous edema and areas of nodular soft tissue with more confluent soft tissue attenuation along the inferior pectoralis major. Shotty adenopathy is noted at the left neck base, the largest discrete node measuring 17 mm in short axis. There is also adenopathy at the right neck base with the largest discrete node measuring 16 mm in short axis. The largest area of subcutaneous nodular soft tissue in the left medial anterior chest measures 18 mm x 15 mm. The fat planes at the neck base and along the axilla There are infiltrated 5 reticular and hazy opacity, greater on the left.  A left mastectomy has been performed since the prior exam. There are changes from a right mastectomy, stable.  The heart is mildly enlarged. There are coronary artery calcifications. There are mildly enlarged and prominent mediastinal lymph nodes. Reference measurement of 1 of the larger, right peritracheal node, is 14 mm in short axis. No hilar masses or discrete enlarged lymph nodes are seen. There  are moderate bilateral pleural effusions with associated compressive atelectasis. There is no pulmonary edema. No discrete lung mass or nodule is seen.  There is aggressive type resorption of the 1st rib where it abuts an area  of ill-defined soft tissue at the left neck base. This is new. Milder erosion is noted along the superior aspect of the posterior lateral 2nd rib. This is also new. There osteolytic lesions along the lower thoracic spine on the left.  Review of the MIP images confirms the above findings.  IMPRESSION: 1. Exam is limited as detailed above. Allowing for this, there is no evidence of a pulmonary embolus. 2. Metastatic breast carcinoma with ill-defined neck base adenopathy, abnormal left anterior chest wall soft tissue consistent with metastatic and/or locally invasive disease, and osteolytic lesions consistent with metastatic disease. 3. Moderate bilateral pleural effusions which could be malignant. No discrete pleural based mass is seen. There is associated compressive atelectasis but no pulmonary edema. No pulmonary nodule or mass is seen. 4. Metastatic adenopathy, lytic bone lesions, left anterior chest wall abnormal soft tissue and the left pleural effusion are new from the prior study. The right pleural effusion has increased in size. Atelectasis is new.   Electronically Signed   By: Amie Portland M.D.   On: 07/22/2013 14:19    EKG Interpretation    Date/Time:  Monday July 22 2013 10:04:28 EST Ventricular Rate:  93 PR Interval:  139 QRS Duration: 84 QT Interval:  349 QTC Calculation: 434 R Axis:   31 Text Interpretation:  Sinus rhythm Atrial premature complex Low voltage, precordial leads Baseline wander in lead(s) V2 V5 No significant change was found Confirmed by CAMPOS  MD, KEVIN ([id number]) on 07/22/2013 10:06:57 AM            MDM   1. Left-sided chest wall pain     1. SOB The patients SOB is likely multifactorial due to metastatic cancer, pleural effusion, and chest wall pain. CTA demonstrated no PE, but Metastatic adenopathy, lytic bone lesions, left anterior chest wall abnormal soft tissue and the left pleural effusion are new from the prior study. No evidence of PNA at this time.  Patient was unabel to afford  long acting oxycodone. Plan to prescribe oxycodone IR 5 mg 1-2 tablets q 4 hr prn (90 tablets). I instructed patient to see oncologist as outpatient tomorrow and see a MD for new or worsening symptoms.  Pleas Koch, MD 07/22/13 [phone]

## 2013-07-22 NOTE — ED Notes (Signed)
Voiced understanding of instructions given 

## 2013-07-22 NOTE — ED Provider Notes (Signed)
I saw and evaluated the patient, reviewed the resident's note and I agree with the findings and plan.  EKG Interpretation    Date/Time:  Monday July 22 2013 10:04:28 EST Ventricular Rate:  93 PR Interval:  139 QRS Duration: 84 QT Interval:  349 QTC Calculation: 434 R Axis:   31 Text Interpretation:  Sinus rhythm Atrial premature complex Low voltage, precordial leads Baseline wander in lead(s) V2 V5 No significant change was found Confirmed by Harlan Ervine  MD, Teighlor Korson ([id number]) on 07/22/2013 10:06:57 AM           Improvement in pain with pain medication.  Her left-sided chest pain is likely related to her new rib lesions and possible cutaneous involvement.  Patient has followup with the radiation oncologist tomorrow.  We'll added additional pain medication for home management.  I do not believe that she is symptomatic from her pleural effusion.  I do not think she'll benefit from thoracentesis today.  Lyanne Co, MD 07/22/13 [phone]

## 2013-07-22 NOTE — ED Notes (Signed)
Patient reports SOB began 2 weeks ago and has gradually gotten worse. Patient states she had a chest-ray yesterday and was told she had fluid on her lung. Patient reporats that the physician told her he needed to drain the fluid tomorrow, but she states SOB was worse today.

## 2013-07-22 NOTE — ED Notes (Signed)
Pt's daughter requested pt be put on O2 due to pt being [name]. Pt placed on 2L O2 via [place]. Pt O2 97-99 on room air.

## 2013-07-22 NOTE — ED Notes (Signed)
Tried to access pt's port. Unable to flush or draw back. Another RN will attempt.

## 2013-07-23 ENCOUNTER — Other Ambulatory Visit: Payer: Self-pay

## 2013-07-23 ENCOUNTER — Ambulatory Visit
Admission: RE | Admit: 2013-07-23 | Discharge: 2013-07-23 | Disposition: A | Discharge status: Home / Self Care | Payer: PRIVATE HEALTH INSURANCE | Source: Ambulatory Visit | Attending: Radiation Oncology | Admitting: Radiation Oncology

## 2013-07-23 DIAGNOSIS — Z510 Encounter for antineoplastic radiation therapy: Secondary | ICD-10-CM | POA: Diagnosis not present

## 2013-07-23 DIAGNOSIS — C50412 Malignant neoplasm of upper-outer quadrant of left female breast: Secondary | ICD-10-CM

## 2013-07-23 MED ORDER — CARVEDILOL 6.25 MG PO TABS: 6.2500 mg | ORAL_TABLET | Freq: Two times a day (BID) | ORAL | Status: DC

## 2013-07-23 NOTE — Progress Notes (Signed)
[facility]    Radiation Oncology [address] [place]     Maryln Gottron, M.D. [place], [place] [address]               Billie Lade, M.D., Ph.D. Phone: [phone]      Molli Hazard A. Kathrynn Running, M.D. Fax: [phone]      Radene Gunning, M.D., Ph.D.         Lurline Hare, M.D.         Grayland Jack, M.D Weekly Treatment Management Note  Name: Jeanette Morris     MRN: [id number]        CSN: [id number] Date: 07/23/2013      DOB: 1946/01/04  CC: Egbert Garibaldi, NP         [name]    Status: Outpatient  Diagnosis: The encounter diagnosis was Breast cancer of upper-outer quadrant of left female breast.  Current Dose: 9  Current Fraction: 5  Planned Dose: 45 Gy  Narrative: Jeanette Morris was seen today for weekly treatment management. The chart was checked and port films  were reviewed. She is tolerating her treatments well thus far. She does have soreness along the left lateral chest area where she has some edema. Patient has had problems with shortness of breath. I saw her on Sunday and chest x-ray showed bilateral pleural effusions slightly increased.  yesterday the patient is breathing worsened and she presented to the emergency room. A chest CT scan ruled out obvious pulmonary embolus. Patient today is breathing easier today  Review of patient's allergies indicates no known allergies. Current Outpatient Prescriptions  Medication Sig Dispense Refill  . Calcium Carbonate-Vitamin D 600-400 MG-UNIT per tablet Take 1 tablet by mouth every morning.       . carvedilol (COREG) 12.5 MG tablet Take 12.5 mg by mouth 2 (two) times daily with a meal. She takes with a 6.25mg  tablet to equal her dose of 18.75mg .      . carvedilol (COREG) 6.25 MG tablet Take 1 tablet (6.25 mg total) by mouth 2 (two) times daily with a meal. She takes with a 12.5 mg tablet to equal her dose of 18.75 mg.  60 tablet  6  . cyanocobalamin 500 MCG tablet Take 500 mcg by mouth daily with breakfast.        . ferrous sulfate 325 (65 FE) MG tablet Take 1 tablet (325 mg total) by mouth daily with breakfast.  30 tablet  1  . gabapentin (NEURONTIN) 300 MG capsule Take 300 mg by mouth at bedtime.       Marland Kitchen oxyCODONE (OXY IR/ROXICODONE) 5 MG immediate release tablet Take 1 tablet (5 mg total) by mouth every 4 (four) hours as needed for severe pain (take 1-2 tables every 4 hours as needed for pain.).  90 tablet  0  . OxyCODONE (OXYCONTIN) 20 mg T12A 12 hr tablet Take 1 tablet (20 mg total) by mouth every 12 (twelve) hours.  60 tablet  0  . oxyCODONE-acetaminophen (PERCOCET/ROXICET) 5-325 MG per tablet Take 2 tablets by mouth every 4 (four) hours as needed for severe pain (For pain.).      Marland Kitchen spironolactone (ALDACTONE) 25 MG tablet Take 25 mg by mouth every morning.      . tamoxifen (NOLVADEX) 20 MG tablet Take 20 mg by mouth every morning.       No current facility-administered medications for this encounter.   Labs:  Lab Results  Component Value Date   WBC 4.2 07/22/2013  HGB 9.4* 07/22/2013   HCT 28.2* 07/22/2013   MCV 90.1 07/22/2013   PLT 237 07/22/2013   Lab Results  Component Value Date   CREATININE 0.99 07/22/2013   BUN 14 07/22/2013   NA 132* 07/22/2013   K 4.0 07/22/2013   CL 98 07/22/2013   CO2 24 07/22/2013   Lab Results  Component Value Date   ALT 37* 07/22/2013   AST 49* 07/22/2013   BILITOT 0.4 07/22/2013    Physical Examination:  There were no vitals filed for this visit.  Wt Readings from Last 3 Encounters:  07/21/13 236 lb 3.2 oz (107.14 kg)  07/19/13 234 lb (106.142 kg)  07/19/13 234 lb 14.4 oz (106.55 kg)    The left chest area shows mild hyperpigmentation changes. She continues to have significant edema in the left arm Lungs - Normal respiratory effort, chest expands symmetrically. Lungs are clear to auscultation, no crackles or wheezes.  Heart has regular rhythm and rate  Abdomen is soft and non tender with normal bowel sounds  Assessment:  Patient  tolerating treatments well  Plan: Continue treatment per original radiation prescription. I discussed consideration for canceling the thoracentesis later this week. The patient however does feel that she would like to proceed with this procedure with potential for improvement in her breathing

## 2013-07-24 ENCOUNTER — Ambulatory Visit: Admission: RE | Admit: 2013-07-24 | Payer: PRIVATE HEALTH INSURANCE | Source: Ambulatory Visit

## 2013-07-26 ENCOUNTER — Ambulatory Visit (HOSPITAL_COMMUNITY)
Admission: RE | Admit: 2013-07-26 | Discharge: 2013-07-26 | Disposition: A | Discharge status: Home / Self Care | Payer: PRIVATE HEALTH INSURANCE | Source: Ambulatory Visit | Attending: Radiation Oncology | Admitting: Radiation Oncology

## 2013-07-26 ENCOUNTER — Ambulatory Visit (HOSPITAL_COMMUNITY)
Admission: RE | Admit: 2013-07-26 | Discharge: 2013-07-26 | Disposition: A | Discharge status: Home / Self Care | Payer: PRIVATE HEALTH INSURANCE | Source: Ambulatory Visit | Attending: Radiology | Admitting: Radiology

## 2013-07-26 VITALS — BP 221/104

## 2013-07-26 DIAGNOSIS — J90 Pleural effusion, not elsewhere classified: Secondary | ICD-10-CM | POA: Insufficient documentation

## 2013-07-26 NOTE — Procedures (Signed)
US guided therapeutic right thoracentesis performed yielding 600 cc's yellow fluid. F/u CXR pending. No immediate complications.

## 2013-07-29 ENCOUNTER — Other Ambulatory Visit: Payer: Self-pay | Admitting: Physician Assistant

## 2013-07-29 ENCOUNTER — Ambulatory Visit
Admission: RE | Admit: 2013-07-29 | Discharge: 2013-07-29 | Disposition: A | Discharge status: Home / Self Care | Payer: PRIVATE HEALTH INSURANCE | Source: Ambulatory Visit | Attending: Radiation Oncology | Admitting: Radiation Oncology

## 2013-07-29 DIAGNOSIS — Z510 Encounter for antineoplastic radiation therapy: Secondary | ICD-10-CM | POA: Diagnosis not present

## 2013-07-30 ENCOUNTER — Ambulatory Visit: Admission: RE | Admit: 2013-07-30 | Payer: PRIVATE HEALTH INSURANCE | Source: Ambulatory Visit

## 2013-07-30 ENCOUNTER — Ambulatory Visit
Admission: RE | Admit: 2013-07-30 | Discharge: 2013-07-30 | Disposition: A | Discharge status: Home / Self Care | Payer: PRIVATE HEALTH INSURANCE | Source: Ambulatory Visit | Attending: Radiation Oncology | Admitting: Radiation Oncology

## 2013-07-30 ENCOUNTER — Encounter: Payer: Self-pay | Admitting: Oncology

## 2013-07-30 ENCOUNTER — Telehealth: Payer: Self-pay | Admitting: Oncology

## 2013-07-30 VITALS — BP 134/61 | HR 71 | Temp 98.9°F | Resp 18 | Ht 64.0 in | Wt 239.0 lb

## 2013-07-30 DIAGNOSIS — C50412 Malignant neoplasm of upper-outer quadrant of left female breast: Secondary | ICD-10-CM

## 2013-07-30 DIAGNOSIS — Z510 Encounter for antineoplastic radiation therapy: Secondary | ICD-10-CM | POA: Diagnosis not present

## 2013-07-30 MED ORDER — HYDROMORPHONE HCL PF 4 MG/ML IJ SOLN
1.0000 mg | Freq: Once | INTRAMUSCULAR | Status: DC
  Filled 2013-07-30: qty 1

## 2013-07-30 MED ORDER — HYDROMORPHONE HCL 4 MG PO TABS: 2.0000 mg | ORAL_TABLET | ORAL | Status: DC | PRN

## 2013-07-30 MED ORDER — HYDROMORPHONE HCL PF 1 MG/ML IJ SOLN
1.0000 mg | Freq: Once | INTRAMUSCULAR | Status: AC
  Administered 2013-07-30: 1 mg via INTRAMUSCULAR
  Filled 2013-07-30: qty 1

## 2013-07-30 MED ORDER — FUROSEMIDE 20 MG PO TABS: 20.0000 mg | ORAL_TABLET | Freq: Every day | ORAL | Status: DC

## 2013-07-30 NOTE — Progress Notes (Signed)
   Department of Radiation Oncology  Phone:  [phone] Fax:        [phone]  The patient presented today prior to her scheduled radiation treatment. She continues to have significant pain along the right chest area which is interfering with her breathing. She did recently undergo a right thoracentesis which minimally helped her breathing issues. Patient recently underwent a chest CT scan to rule out pulmonary embolus as the cause of her breathing issues.  Today the patient was given a IM injection of Dilaudid which helped her be more comfortable.  She also had placement of 2 L of oxygen by nasal cannula which also helped with her symptoms. After long discussion with the patient and her family as well as her medical oncologist Dr. Darnelle Catalan we have decided to discontinue the patient's radiation therapy directed to the left chest area, as her major issues appear to be along her right chest at this time. Dr. Darnelle Catalan may attempt a course of palliative chemotherapy in the near future. Patient was given a prescription for hydromorphone and her insurance provider may also approve her OxyContin in the near future.   -----------------------------------  Billie Lade, PhD, MD

## 2013-07-30 NOTE — Progress Notes (Signed)
Gave patient 1 mg dilaudid IM in her right ventrogluteal area.  Helped her to a comfortable position.  Will continue to monitor.

## 2013-07-30 NOTE — Progress Notes (Signed)
Jeanette Morris here with her husband and daughter for weekly under treat visit.  She has had 6 fractions to her left chest wall.  She is having severe pain in her right side/ribs today.  She is rating the pain at a 12/10.  She says it hurts worse when taking a deep breath. Laying flat and with talking.  She is wondering if there is any other pain medications she can take.  She is also have swelling in both arms.  She has had this since her surgery but she says it is worse.  She had the thoracentesis on Friday and the took out 600 cc.

## 2013-07-30 NOTE — Progress Notes (Signed)
Patient reports right side pain continues under her ribs 10 on a scale of 0-10. Also, patient reports feeling lightheaded. Patient appears less anxious. Children at the bedside. Encouraged to call with future needs. All verbalized understanding.

## 2013-07-30 NOTE — Telephone Encounter (Signed)
[facility] called and asked if Jeanette Morris could get 2 mg tablets of dilaudid instead of 4 mg.  Her family said they did not want to cut the pills in half.  Advised her that it was OK to give her 2 mg tablets.

## 2013-07-31 ENCOUNTER — Telehealth: Payer: Self-pay | Admitting: Oncology

## 2013-07-31 ENCOUNTER — Ambulatory Visit: Payer: PRIVATE HEALTH INSURANCE

## 2013-07-31 NOTE — Telephone Encounter (Signed)
Called [facility] on 07/30/2013 to start prior authorization or oxycontin 20 mg tablets.  Per [facility], it will take 72 hours for their pharmacist to review the claim and they will fax the result to [phone].

## 2013-07-31 NOTE — Telephone Encounter (Signed)
Burt Knack and [name] called and wanted to make sure everyone knew that Jeanette Morris is not coming for radiation today.  [name] said that her pain is about the same but that the dilaudid is helping a little.  She wants to thank everyone for all the help that she received yesterday.

## 2013-08-01 ENCOUNTER — Ambulatory Visit: Payer: PRIVATE HEALTH INSURANCE

## 2013-08-01 ENCOUNTER — Other Ambulatory Visit: Payer: Self-pay | Admitting: *Deleted

## 2013-08-02 ENCOUNTER — Telehealth: Payer: Self-pay | Admitting: Oncology

## 2013-08-02 ENCOUNTER — Telehealth: Payer: Self-pay | Admitting: *Deleted

## 2013-08-02 ENCOUNTER — Ambulatory Visit: Payer: PRIVATE HEALTH INSURANCE

## 2013-08-02 NOTE — Telephone Encounter (Signed)
This RN spoke with pt on 12/4 post her call inquiring about need to see MD to discuss need to restart chemo since she will not be getting more radiation. Noted pt is scheduled to be seen 12/10 and above concerns will be discussed at that time.  On Thursday 12/4 pt stated pain was better controlled.  Today this RN called pt's home to discuss pain concern which she stated to Rad Onc RN per her call. At time of call pt's dtr stated mother was " finally taking a nap ". Per discussion with dtr regarding pain she states pain is about the same but arm is still swollen and bothers mother.  This RN then received message from pt's dtr stating mother's pain was worse.  This RN spoke with pt at 330pm and discussed current use of pain medication which Memory verified as taking the oxycontin 20mg  this am with first use of breakthrough pain med taken now of 2 oxy-IR. Pt states " my arm feels so heavy "  Per discussion this RN informed pt pain would get better in approximately 30 minutes post taking pain medication- but due to swelling and heaviness of arm - pt may want to proceed to the ER for further interventions that could help her.  Pt verbalized understanding.

## 2013-08-02 NOTE — Telephone Encounter (Signed)
Called Jeanette Morris to see how she is doing.  She said her pain has improved since she has started taking the Oxycodone 20 mg.  She said she has some soreness on her right side.  She also said the swelling has increased in her right arm and she is not able to lift it.  She said she has an appointment with Dr. Darrall Dears office on Wednesday.

## 2013-08-03 ENCOUNTER — Inpatient Hospital Stay (HOSPITAL_COMMUNITY)
Admission: EM | Admit: 2013-08-03 | Discharge: 2013-08-12 | DRG: 180 | Disposition: A | Discharge status: Skilled Nursing Facility | Payer: PRIVATE HEALTH INSURANCE | Attending: Oncology | Admitting: Oncology

## 2013-08-03 ENCOUNTER — Encounter (HOSPITAL_COMMUNITY): Payer: Self-pay | Admitting: Emergency Medicine

## 2013-08-03 ENCOUNTER — Emergency Department (HOSPITAL_COMMUNITY): Payer: PRIVATE HEALTH INSURANCE

## 2013-08-03 ENCOUNTER — Other Ambulatory Visit: Payer: Self-pay | Admitting: Oncology

## 2013-08-03 DIAGNOSIS — G589 Mononeuropathy, unspecified: Secondary | ICD-10-CM | POA: Diagnosis present

## 2013-08-03 DIAGNOSIS — J910 Malignant pleural effusion: Principal | ICD-10-CM | POA: Diagnosis present

## 2013-08-03 DIAGNOSIS — I509 Heart failure, unspecified: Secondary | ICD-10-CM | POA: Diagnosis present

## 2013-08-03 DIAGNOSIS — D630 Anemia in neoplastic disease: Secondary | ICD-10-CM | POA: Diagnosis present

## 2013-08-03 DIAGNOSIS — Z87891 Personal history of nicotine dependence: Secondary | ICD-10-CM

## 2013-08-03 DIAGNOSIS — C50919 Malignant neoplasm of unspecified site of unspecified female breast: Secondary | ICD-10-CM | POA: Diagnosis present

## 2013-08-03 DIAGNOSIS — N179 Acute kidney failure, unspecified: Secondary | ICD-10-CM | POA: Diagnosis present

## 2013-08-03 DIAGNOSIS — E669 Obesity, unspecified: Secondary | ICD-10-CM | POA: Diagnosis present

## 2013-08-03 DIAGNOSIS — J9600 Acute respiratory failure, unspecified whether with hypoxia or hypercapnia: Secondary | ICD-10-CM | POA: Diagnosis present

## 2013-08-03 DIAGNOSIS — N189 Chronic kidney disease, unspecified: Secondary | ICD-10-CM | POA: Diagnosis present

## 2013-08-03 DIAGNOSIS — K5900 Constipation, unspecified: Secondary | ICD-10-CM | POA: Diagnosis present

## 2013-08-03 DIAGNOSIS — Z9221 Personal history of antineoplastic chemotherapy: Secondary | ICD-10-CM

## 2013-08-03 DIAGNOSIS — I4891 Unspecified atrial fibrillation: Secondary | ICD-10-CM | POA: Diagnosis present

## 2013-08-03 DIAGNOSIS — C50411 Malignant neoplasm of upper-outer quadrant of right female breast: Secondary | ICD-10-CM

## 2013-08-03 DIAGNOSIS — J189 Pneumonia, unspecified organism: Secondary | ICD-10-CM | POA: Diagnosis present

## 2013-08-03 DIAGNOSIS — G8929 Other chronic pain: Secondary | ICD-10-CM | POA: Diagnosis present

## 2013-08-03 DIAGNOSIS — E875 Hyperkalemia: Secondary | ICD-10-CM | POA: Diagnosis present

## 2013-08-03 DIAGNOSIS — I129 Hypertensive chronic kidney disease with stage 1 through stage 4 chronic kidney disease, or unspecified chronic kidney disease: Secondary | ICD-10-CM | POA: Diagnosis present

## 2013-08-03 DIAGNOSIS — C781 Secondary malignant neoplasm of mediastinum: Secondary | ICD-10-CM | POA: Diagnosis present

## 2013-08-03 DIAGNOSIS — J90 Pleural effusion, not elsewhere classified: Secondary | ICD-10-CM | POA: Diagnosis present

## 2013-08-03 DIAGNOSIS — Z79899 Other long term (current) drug therapy: Secondary | ICD-10-CM

## 2013-08-03 DIAGNOSIS — E785 Hyperlipidemia, unspecified: Secondary | ICD-10-CM | POA: Diagnosis present

## 2013-08-03 DIAGNOSIS — Z9010 Acquired absence of unspecified breast and nipple: Secondary | ICD-10-CM

## 2013-08-03 DIAGNOSIS — I428 Other cardiomyopathies: Secondary | ICD-10-CM | POA: Diagnosis present

## 2013-08-03 DIAGNOSIS — Z8611 Personal history of tuberculosis: Secondary | ICD-10-CM

## 2013-08-03 DIAGNOSIS — D6481 Anemia due to antineoplastic chemotherapy: Secondary | ICD-10-CM | POA: Diagnosis present

## 2013-08-03 DIAGNOSIS — T451X5A Adverse effect of antineoplastic and immunosuppressive drugs, initial encounter: Secondary | ICD-10-CM | POA: Diagnosis present

## 2013-08-03 DIAGNOSIS — E871 Hypo-osmolality and hyponatremia: Secondary | ICD-10-CM | POA: Diagnosis present

## 2013-08-03 DIAGNOSIS — C779 Secondary and unspecified malignant neoplasm of lymph node, unspecified: Secondary | ICD-10-CM | POA: Diagnosis present

## 2013-08-03 DIAGNOSIS — C50412 Malignant neoplasm of upper-outer quadrant of left female breast: Secondary | ICD-10-CM

## 2013-08-03 DIAGNOSIS — C7951 Secondary malignant neoplasm of bone: Secondary | ICD-10-CM | POA: Diagnosis present

## 2013-08-03 DIAGNOSIS — Z923 Personal history of irradiation: Secondary | ICD-10-CM

## 2013-08-03 DIAGNOSIS — Z803 Family history of malignant neoplasm of breast: Secondary | ICD-10-CM

## 2013-08-03 DIAGNOSIS — L989 Disorder of the skin and subcutaneous tissue, unspecified: Secondary | ICD-10-CM

## 2013-08-03 DIAGNOSIS — I427 Cardiomyopathy due to drug and external agent: Secondary | ICD-10-CM

## 2013-08-03 DIAGNOSIS — Z6839 Body mass index (BMI) 39.0-39.9, adult: Secondary | ICD-10-CM

## 2013-08-03 DIAGNOSIS — T50905S Adverse effect of unspecified drugs, medicaments and biological substances, sequela: Secondary | ICD-10-CM

## 2013-08-03 LAB — BASIC METABOLIC PANEL
CO2: 28 mEq/L (ref 19–32)
Chloride: 95 mEq/L — ABNORMAL LOW (ref 96–112)
Creatinine, Ser: 1.19 mg/dL — ABNORMAL HIGH (ref 0.50–1.10)
Potassium: 4.3 mEq/L (ref 3.5–5.1)
Sodium: 128 mEq/L — ABNORMAL LOW (ref 135–145)

## 2013-08-03 LAB — CBC WITH DIFFERENTIAL/PLATELET
Basophils Absolute: 0 10*3/uL (ref 0.0–0.1)
HCT: 27.8 % — ABNORMAL LOW (ref 36.0–46.0)
Hemoglobin: 9.3 g/dL — ABNORMAL LOW (ref 12.0–15.0)
Lymphocytes Relative: 23 % (ref 12–46)
Monocytes Absolute: 0.4 10*3/uL (ref 0.1–1.0)
Monocytes Relative: 12 % (ref 3–12)
Neutro Abs: 2.2 10*3/uL (ref 1.7–7.7)
Platelets: 216 10*3/uL (ref 150–400)
RDW: 13.2 % (ref 11.5–15.5)
WBC: 3.5 10*3/uL — ABNORMAL LOW (ref 4.0–10.5)

## 2013-08-03 LAB — PRO B NATRIURETIC PEPTIDE: Pro B Natriuretic peptide (BNP): 612.9 pg/mL — ABNORMAL HIGH (ref 0–125)

## 2013-08-03 MED ORDER — HYDROMORPHONE HCL PF 1 MG/ML IJ SOLN
1.0000 mg | Freq: Once | INTRAMUSCULAR | Status: AC
  Administered 2013-08-03: 1 mg via INTRAMUSCULAR
  Filled 2013-08-03: qty 1

## 2013-08-03 MED ORDER — VANCOMYCIN HCL 10 G IV SOLR
2000.0000 mg | INTRAVENOUS | Status: AC
  Administered 2013-08-04: 02:00:00 2000 mg via INTRAVENOUS
  Filled 2013-08-03: qty 2000

## 2013-08-03 MED ORDER — DEXTROSE 5 % IV SOLN
1.0000 g | Freq: Once | INTRAVENOUS | Status: AC
  Administered 2013-08-04: 1 g via INTRAVENOUS
  Filled 2013-08-03: qty 1

## 2013-08-03 MED ORDER — VANCOMYCIN HCL 10 G IV SOLR
1500.0000 mg | INTRAVENOUS | Status: DC
  Administered 2013-08-04: 1500 mg via INTRAVENOUS
  Filled 2013-08-03: qty 1500

## 2013-08-03 NOTE — H&P (Signed)
[facility] History and Physical  Jeanette Morris ZOX:[id number] DOB: 11-14-1945 DOA: 08/03/2013  Referring physician: EDP PCP: Egbert Garibaldi, NP  Specialists: Onc Dr.Magrinat  Chief Complaint: Shortness of breath  HPI: Jeanette Morris is a 67 y.o. 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 She denies any cough, fevers chills or wheezing. She reports mild lower extremity edema. In the ER, temp of 99.1, sodium of 128, BNP 612, chest x-ray with bilateral pleural effusions moderate size in the left A. and left basilar airspace disease versus edema.   Review of Systems: The patient denies anorexia, fever, weight loss,, vision loss, decreased hearing, hoarseness, chest pain, syncope, dyspnea on exertion, peripheral edema, balance deficits, hemoptysis, abdominal pain, melena, hematochezia, severe indigestion/heartburn, hematuria, incontinence, genital sores, muscle weakness, suspicious skin lesions, transient blindness, difficulty walking, depression, unusual weight change, abnormal bleeding, enlarged lymph nodes, angioedema, and breast masses.    Past Medical History  Diagnosis Date  . Hypertension   . Hearing loss     right ear  . Hyperlipidemia   . Arthritis   . Dysrhythmia     A fib with IV chemo treatments  . Shortness of breath     with exertion  .  Tuberculosis     years ago-had 1 year tx  . 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 Breast cancer     (Rt) breast ca dx 9/11  . 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 Neuropathy   . History of radiation therapy 01/18/11-03/07/11    5940 cGy to Right chest wall, axilla and supraclavicular region   Past Surgical History  Procedure Laterality Date  . Tubal ligation    . Portacath placement Left   . Abdominal hysterectomy    . Lung biopsy    . Lesion excision      L anterior thigh  . Breast surgery  12/01/10    ER+,PR+,Ki 67 64%,Her2 -; right breast lumpectomy  . Chest tube insertion  08/08/2012    Procedure: INSERTION PLEURAL DRAINAGE CATHETER;  Surgeon: Alleen Borne, MD;  Location: [facility] OR;  Service: Thoracic;  Laterality: Right;  . Removal of pleural drainage catheter Right 10/10/2012    Procedure: REMOVAL OF PLEURAL DRAINAGE CATHETER;  Surgeon: Alleen Borne, MD;  Location: [facility] OR;  Service: Thoracic;  Laterality: Right;  . Mastectomy, radical Left 05/08/2013    Dr Carolynne Edouard  . Mastectomy modified radical Left 05/08/2013    Procedure: MASTECTOMY MODIFIED RADICAL;  Surgeon: Robyne Askew, MD;  Location: [facility] OR;  Service: General;  Laterality: Left;   Social History:  reports that she quit smoking about 40 years ago. She has never used smokeless tobacco. She reports that she does not drink alcohol or use illicit drugs. Lives at home with spouse, daughter  No Known Allergies  family history -Notable for breast cancer  Prior to Admission medications   Medication Sig Start Date  End Date Taking? Authorizing Provider  Calcium Carbonate-Vitamin D 600-400 MG-UNIT per tablet Take 1 tablet by mouth every morning.    Yes Historical Provider, MD  carvedilol (COREG) 12.5 MG tablet Take 12.5 mg by mouth 2 (two) times daily with a meal. She takes with a 6.25mg  tablet to equal her dose of 18.75mg .   Yes Historical Provider, MD  carvedilol (COREG) 6.25 MG tablet Take 6.25 mg by mouth 2 (two) times daily with a  meal. She takes with a 12.5 mg tablet to equal her dose of 18.75 mg. 07/23/13  Yes Dolores Patty, MD  cyanocobalamin 500 MCG tablet Take 500 mcg by mouth daily with breakfast.    Yes Historical Provider, MD  ferrous sulfate 325 (65 FE) MG tablet Take 1 tablet (325 mg total) by mouth daily with breakfast. 08/05/12  Yes Renae Fickle, MD  gabapentin (NEURONTIN) 300 MG capsule Take 300 mg by mouth at bedtime.    Yes Historical Provider, MD  HYDROmorphone (DILAUDID) 4 MG tablet Take 2 mg by mouth every 4 (four) hours as needed for severe pain. 07/30/13  Yes Billie Lade, MD  OxyCODONE (OXYCONTIN) 20 mg T12A 12 hr tablet Take 20 mg by mouth every 12 (twelve) hours. 07/09/13  Yes Billie Lade, MD  spironolactone (ALDACTONE) 25 MG tablet Take 25 mg by mouth every morning.   Yes Historical Provider, MD  tamoxifen (NOLVADEX) 20 MG tablet Take 20 mg by mouth every morning.   Yes Historical Provider, MD  furosemide (LASIX) 20 MG tablet Take 1 tablet (20 mg total) by mouth daily. 07/30/13   Billie Lade, MD   Physical Exam: Filed Vitals:   08/03/13 1959  BP: 153/75  Pulse: 93  Temp: 99.1 F (37.3 C)  Resp: 22     General:Alert awake oriented x3 no distress, morbidly obese  HEENT: PERRLA, EOMI  CVS S1-S2 regular rate rhythm no murmurs rubs or gallops  Lungs distant breath sounds, diminished at the bases  Chest: Bilateral mastectomy scars  Abdomen soft obese nontender nondistended positive bowel sounds  Extremities trace edema in lower extremities  Upper extremities: With bilateral lymphedema, mild tenderness in right arm  Psychiatric appropriate mood and affect  Skin no rashes or skin breakdown    Labs on Admission:  Basic Metabolic Panel:  Recent Labs Lab 08/03/13 2100  NA 128*  K 4.3  CL 95*  CO2 28  GLUCOSE 112*  BUN 20  CREATININE 1.19*  CALCIUM 8.8   Liver Function Tests: No results found for this basename: AST, ALT, ALKPHOS, BILITOT, PROT, ALBUMIN,  in  the last 168 hours No results found for this basename: LIPASE, AMYLASE,  in the last 168 hours No results found for this basename: AMMONIA,  in the last 168 hours CBC:  Recent Labs Lab 08/03/13 2100  WBC 3.5*  NEUTROABS 2.2  HGB 9.3*  HCT 27.8*  MCV 90.0  PLT 216   Cardiac Enzymes: No results found for this basename: CKTOTAL, CKMB, CKMBINDEX, TROPONINI,  in the last 168 hours  BNP (last 3 results)  Recent Labs  08/03/13 2100  PROBNP 612.9*   CBG: No results found for this basename: GLUCAP,  in the last 168 hours  Radiological Exams on Admission: Dg Chest 2 View  08/03/2013   CLINICAL DATA:  Dyspnea.  History of smoking.  EXAM: CHEST  2 VIEW  COMPARISON:  Chest radiograph performed 07/26/2013  FINDINGS: The lungs are hypoexpanded. Moderate left and small right pleural effusions are  seen, with associated left basilar airspace opacification. Underlying vascular crowding and vascular congestion are seen. This may reflect pneumonia or possibly asymmetric pulmonary edema. No pneumothorax is identified.  The heart remains normal in size. A left-sided subclavian line is noted ending about the mid SVC. No acute osseous abnormalities are identified. Clips are seen at the axilla bilaterally.  IMPRESSION: Lungs hypoexpanded. Moderate left and small right pleural effusions, with associated left basilar airspace opacification. This may reflect pneumonia or possibly asymmetric pulmonary edema.   Electronically Signed   By: Roanna Raider M.D.   On: 08/03/2013 21:38    Assessment/Plan  1. Acute hypoxic respiratory failure -Suspect multifactorial, secondary to pleural effusions and CHF. -Unable to rule out pneumonia -Will diurese with IV Lasix 40 mg every 12,  -I/Os, daily weights -Last echo 8/14 with EF of 55% and grade 1 diastolic dysfunction -Continue Coreg and Aldactone -Will order ultrasound guided thoracentesis of the left pleural effusion -For now empirically cover her with  antibiotics for possible pneumonia(low suspicion) -Follow up blood cultures  2. bilateral lymphedema increased pain in the right arm -Check Dopplers to rule out DVT  3. chronic pain -To chest wall and arms -Continue oxycontin, and Dilaudid IV PRN  4. metastatic breast cancer -Off XRT -Continue tamoxifen -Notify Dr.Magrinat via Epic,   5. Hyponatremia -Suspect hypervolemic hyponatremia -Monitor with diuresis  6. anemia of chronic disease -Stable  Code Status: code  Family Communication:  discussed with spouse and other bedside  Disposition Plan:  inpatient   Time spent:  60 minutes   Elchanan Bob [facility] Pager [phone]   If 7PM-7AM, please contact night-coverage [email or web address] Password [facility] 08/03/2013, 11:46 PM

## 2013-08-03 NOTE — ED Notes (Signed)
D/t pt's b/l UE being restricted Dr. Criss Alvine gave verbal okay to draw blood from foot for second set of blood cultures.

## 2013-08-03 NOTE — ED Provider Notes (Signed)
CSN: [id number]     Arrival date & time 08/03/13  1934 History   First MD Initiated Contact with Patient 08/03/13 1951     Chief Complaint  Patient presents with  . Lymphadenopathy   (Consider location/radiation/quality/duration/timing/severity/associated sxs/prior Treatment) HPI Comments: 67 year old female with a history of bilateral breast cancer presents with shortness of breath has been getting progressively worse over the past week. She chronically has shortness of breath but now she is having trouble laying flat which is a new symptom. She is also having acute on chronic pain from her lymphedema and bilateral upper extremities. The pain is currently severe. The shortness of breath associated with a mild cough without change recently. She is also intermittent chest tightness. Denies any fevers or chills. Denies abdominal pain. She was seen here 5 days ago and had a negative CT scan for pulmonary embolus. Since then she states her shortness of breath has been getting worse.   Past Medical History  Diagnosis Date  . Hypertension   . Hearing loss     right ear  . Hyperlipidemia   . Arthritis   . Dysrhythmia     A fib with IV chemo treatments  . Shortness of breath     with exertion  . Tuberculosis     years ago-had 1 year tx  . 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 Breast cancer     (Rt) breast ca dx 9/11  . 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 Neuropathy   . History of radiation therapy 01/18/11-03/07/11    5940 cGy to Right chest wall, axilla and supraclavicular region   Past Surgical History  Procedure Laterality Date  . Tubal ligation    . Portacath placement Left   . Abdominal hysterectomy    . Lung biopsy    . Lesion excision      L anterior thigh  . Breast surgery  12/01/10    ER+,PR+,Ki 67 64%,Her2 -; right breast lumpectomy  . Chest tube insertion  08/08/2012    Procedure: INSERTION PLEURAL DRAINAGE CATHETER;  Surgeon: Alleen Borne, MD;  Location: [facility] OR;  Service:  Thoracic;  Laterality: Right;  . Removal of pleural drainage catheter Right 10/10/2012    Procedure: REMOVAL OF PLEURAL DRAINAGE CATHETER;  Surgeon: Alleen Borne, MD;  Location: [facility] OR;  Service: Thoracic;  Laterality: Right;  . Mastectomy, radical Left 05/08/2013    Dr Carolynne Edouard  . Mastectomy modified radical Left 05/08/2013    Procedure: MASTECTOMY MODIFIED RADICAL;  Surgeon: Robyne Askew, MD;  Location: [facility] OR;  Service: General;  Laterality: Left;   No family history on file. History  Substance Use Topics  . Smoking status: Former Smoker    Quit date: 06/30/1973  . Smokeless tobacco: Never Used  . Alcohol Use: No   OB History   Grav Para Term Preterm Abortions TAB SAB Ect Mult Living                 Review of Systems  Constitutional: Negative for fever and chills.  Respiratory: Positive for cough and shortness of breath.   All other systems reviewed and are negative.    Allergies  Review of patient's allergies indicates no known allergies.  Home Medications   Current Outpatient Rx  Name  Route  Sig  Dispense  Refill  . Calcium Carbonate-Vitamin D 600-400 MG-UNIT per tablet   Oral   Take 1 tablet by mouth every morning.          Marland Kitchen  carvedilol (COREG) 12.5 MG tablet   Oral   Take 12.5 mg by mouth 2 (two) times daily with a meal. She takes with a 6.25mg  tablet to equal her dose of 18.75mg .         . carvedilol (COREG) 6.25 MG tablet   Oral   Take 1 tablet (6.25 mg total) by mouth 2 (two) times daily with a meal. She takes with a 12.5 mg tablet to equal her dose of 18.75 mg.   60 tablet   6   . cyanocobalamin 500 MCG tablet   Oral   Take 500 mcg by mouth daily with breakfast.          . ferrous sulfate 325 (65 FE) MG tablet   Oral   Take 1 tablet (325 mg total) by mouth daily with breakfast.   30 tablet   1   . furosemide (LASIX) 20 MG tablet   Oral   Take 1 tablet (20 mg total) by mouth daily.   3 tablet   0   . gabapentin (NEURONTIN) 300 MG  capsule   Oral   Take 300 mg by mouth at bedtime.          Marland Kitchen HYDROmorphone (DILAUDID) 4 MG tablet   Oral   Take 0.5 tablets (2 mg total) by mouth every 4 (four) hours as needed for severe pain.   30 tablet   0   . oxyCODONE (OXY IR/ROXICODONE) 5 MG immediate release tablet   Oral   Take 1 tablet (5 mg total) by mouth every 4 (four) hours as needed for severe pain (take 1-2 tables every 4 hours as needed for pain.).   90 tablet   0   . OxyCODONE (OXYCONTIN) 20 mg T12A 12 hr tablet   Oral   Take 1 tablet (20 mg total) by mouth every 12 (twelve) hours.   60 tablet   0   . oxyCODONE-acetaminophen (PERCOCET/ROXICET) 5-325 MG per tablet   Oral   Take 2 tablets by mouth every 4 (four) hours as needed for severe pain (For pain.).         Marland Kitchen spironolactone (ALDACTONE) 25 MG tablet   Oral   Take 25 mg by mouth every morning.         . tamoxifen (NOLVADEX) 20 MG tablet   Oral   Take 20 mg by mouth every morning.          BP 153/75  Pulse 93  Temp(Src) 99.1 F (37.3 C) (Oral)  Resp 22  Ht 5\' 4"  (1.626 m)  Wt 235 lb (106.595 kg)  BMI 40.32 kg/m2  SpO2 98% Physical Exam  Nursing note and vitals reviewed. Constitutional: She is oriented to person, place, and time. She appears well-developed and well-nourished.  HENT:  Head: Normocephalic and atraumatic.  Right Ear: External ear normal.  Left Ear: External ear normal.  Nose: Nose normal.  Eyes: Right eye exhibits no discharge. Left eye exhibits no discharge.  Cardiovascular: Normal rate, regular rhythm and normal heart sounds.   Pulmonary/Chest: Effort normal. No respiratory distress. She has decreased breath sounds in the right lower field and the left lower field.  Abdominal: Soft. There is no tenderness.  Musculoskeletal:  Bilateral upper extremity lymphedema  Neurological: She is alert and oriented to person, place, and time.  Skin: Skin is warm and dry.    ED Course  Procedures (including critical care  time) Labs Review Labs Reviewed  CBC WITH DIFFERENTIAL - Abnormal; Notable for  the following:    WBC 3.5 (*)    RBC 3.09 (*)    Hemoglobin 9.3 (*)    HCT 27.8 (*)    All other components within normal limits  BASIC METABOLIC PANEL - Abnormal; Notable for the following:    Sodium 128 (*)    Chloride 95 (*)    Glucose, Bld 112 (*)    Creatinine, Ser 1.19 (*)    GFR calc non Af Amer 45 (*)    GFR calc Af Amer 53 (*)    All other components within normal limits  PRO B NATRIURETIC PEPTIDE - Abnormal; Notable for the following:    Pro B Natriuretic peptide (BNP) 612.9 (*)    All other components within normal limits  CULTURE, BLOOD (ROUTINE X 2)  CULTURE, BLOOD (ROUTINE X 2)  POCT I-STAT TROPONIN I   Imaging Review Dg Chest 2 View  08/03/2013   CLINICAL DATA:  Dyspnea.  History of smoking.  EXAM: CHEST  2 VIEW  COMPARISON:  Chest radiograph performed 07/26/2013  FINDINGS: The lungs are hypoexpanded. Moderate left and small right pleural effusions are seen, with associated left basilar airspace opacification. Underlying vascular crowding and vascular congestion are seen. This may reflect pneumonia or possibly asymmetric pulmonary edema. No pneumothorax is identified.  The heart remains normal in size. A left-sided subclavian line is noted ending about the mid SVC. No acute osseous abnormalities are identified. Clips are seen at the axilla bilaterally.  IMPRESSION: Lungs hypoexpanded. Moderate left and small right pleural effusions, with associated left basilar airspace opacification. This may reflect pneumonia or possibly asymmetric pulmonary edema.   Electronically Signed   By: Roanna Raider M.D.   On: 08/03/2013 21:38    EKG Interpretation    Date/Time:    Ventricular Rate:    PR Interval:    QRS Duration:   QT Interval:    QTC Calculation:   R Axis:     Text Interpretation:              Date: 08/04/2013  Rate: 90  Rhythm: normal sinus rhythm  QRS Axis: normal   Intervals: QT prolonged  ST/T Wave abnormalities: nonspecific T wave changes  Conduction Disutrbances:none  Narrative Interpretation:   Old EKG Reviewed: unchanged    MDM   1. Pneumonia    Patient appears OK here, no resp distress. Given that Xray shows new infiltrate vs edema with low WBC and patient being immunocompromised, will treat as HCAP (admitted within last 3 months) and admit to medicine.    Audree Camel, MD 08/04/13 [phone]

## 2013-08-03 NOTE — ED Notes (Addendum)
Swelling in b/l arms started Tuesday, pt was directed by cancer center to come to ED to have arms wrapped on 12/5.  Today pain in b/l arms reached 10/10 squeezing in nature.  Pt has a hx of breast s/p b/l mastectomy.  Pt has significant b/l non pitting edema to arms.

## 2013-08-03 NOTE — Progress Notes (Addendum)
ANTIBIOTIC CONSULT NOTE - INITIAL  Pharmacy Consult for Vancomycin & Cefepime Indication: Suspected Pneumonia  No Known Allergies  Patient Measurements: Height: 5\' 4"  (162.6 cm) Weight: 235 lb (106.595 kg) IBW/kg (Calculated) : 54.7  Vital Signs: Temp: 99.1 F (37.3 C) (12/06 1959) Temp src: Oral (12/06 1959) BP: 153/75 mmHg (12/06 1959) Pulse Rate: 93 (12/06 1959) Intake/Output from previous day:   Intake/Output from this shift:    Labs:  Recent Labs  08/03/13 2100  WBC 3.5*  HGB 9.3*  PLT 216  CREATININE 1.19*   Estimated Creatinine Clearance: 53.2 ml/min (by C-G formula based on Cr of 1.19). No results found for this basename: VANCOTROUGH, VANCOPEAK, VANCORANDOM, GENTTROUGH, GENTPEAK, GENTRANDOM, TOBRATROUGH, TOBRAPEAK, TOBRARND, AMIKACINPEAK, AMIKACINTROU, AMIKACIN,  in the last 72 hours   Microbiology: No results found for this or any previous visit (from the past 720 hour(s)).  Medical History: Past Medical History  Diagnosis Date  . Hypertension   . Hearing loss     right ear  . Hyperlipidemia   . Arthritis   . Dysrhythmia     A fib with IV chemo treatments  . Shortness of breath     with exertion  . Tuberculosis     years ago-had 1 year tx  . 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 Breast cancer     (Rt) breast ca dx 9/11  . 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 Neuropathy   . History of radiation therapy 01/18/11-03/07/11    5940 cGy to Right chest wall, axilla and supraclavicular region    Medications:  Scheduled:   Infusions:  . ceFEPime (MAXIPIME) IV     Assessment:  67 yr female with h/o breast CA has complaint of worsening SOB with mild cough, some chest tightness  Chest Xray shows suspected pneumonia  Cefepime 1gm IV x 1 ordered in the ED  Vancomycin per pharmacy dosing ordered for suspected pneumonia. Vancomycin to be given x 8 days per MD consult order.  Upon admission, cefepime per pharmacy ordered.  Pharmacy also consulted to  adjust antibiotics for renal function   Goal of Therapy:  Vancomycin trough level 10-15 mcg/ml Vancomycin trough level 15-20 mcg/ml  Plan:  Measure antibiotic drug levels at steady state Follow up culture results Vancomycin 2000mg  IV x 1 followed by 1500mg  IV q24h Cefepime 1gm IV q8h   Malvina Schadler, Joselyn Glassman, PharmD 08/03/2013,11:07 PM

## 2013-08-03 NOTE — ED Notes (Signed)
Per EMS report: pt from home: pt has bilateral arm swelling.  Pt had breast cancer surgery in which her right breast was removed.  Pt's left breast was removed 3 years.  Swelling has progressively gotten worse to the point where pt is having trouble lifting them up.  EMS reports her lungs are clear.  Pt has dyspnea upon exertion.  Pt a/o x 4.  Pt denies any pain.

## 2013-08-03 NOTE — ED Notes (Signed)
Bed: OZ30 Expected date:  Expected time:  Means of arrival:  Comments: EMS 67yo F, upper ext swelling

## 2013-08-04 DIAGNOSIS — M79609 Pain in unspecified limb: Secondary | ICD-10-CM

## 2013-08-04 LAB — BASIC METABOLIC PANEL
BUN: 18 mg/dL (ref 6–23)
CO2: 26 mEq/L (ref 19–32)
Chloride: 97 mEq/L (ref 96–112)
GFR calc Af Amer: 56 mL/min — ABNORMAL LOW (ref >90)
GFR calc non Af Amer: 48 mL/min — ABNORMAL LOW (ref >90)
Potassium: 4.2 mEq/L (ref 3.5–5.1)

## 2013-08-04 LAB — CBC
HCT: 27.4 % — ABNORMAL LOW (ref 36.0–46.0)
Hemoglobin: 8.9 g/dL — ABNORMAL LOW (ref 12.0–15.0)
MCHC: 32.5 g/dL (ref 30.0–36.0)
RBC: 3.02 MIL/uL — ABNORMAL LOW (ref 3.87–5.11)
RDW: 13.4 % (ref 11.5–15.5)
WBC: 3.8 10*3/uL — ABNORMAL LOW (ref 4.0–10.5)

## 2013-08-04 MED ORDER — ACETAMINOPHEN 650 MG RE SUPP: 650.0000 mg | Freq: Four times a day (QID) | RECTAL | Status: DC | PRN

## 2013-08-04 MED ORDER — FERROUS SULFATE 325 (65 FE) MG PO TABS
325.0000 mg | ORAL_TABLET | Freq: Every day | ORAL | Status: DC
  Administered 2013-08-04 – 2013-08-12 (×9): 325 mg via ORAL
  Filled 2013-08-04 (×10): qty 1

## 2013-08-04 MED ORDER — SENNA 8.6 MG PO TABS
1.0000 | ORAL_TABLET | Freq: Every day | ORAL | Status: DC | PRN
  Administered 2013-08-04: 21:00:00 8.6 mg via ORAL
  Filled 2013-08-04: qty 1

## 2013-08-04 MED ORDER — OXYCODONE HCL ER 20 MG PO T12A
20.0000 mg | EXTENDED_RELEASE_TABLET | Freq: Two times a day (BID) | ORAL | Status: DC
  Administered 2013-08-04 – 2013-08-12 (×18): 20 mg via ORAL
  Filled 2013-08-04 (×18): qty 1

## 2013-08-04 MED ORDER — TAMOXIFEN CITRATE 10 MG PO TABS
20.0000 mg | ORAL_TABLET | Freq: Every morning | ORAL | Status: DC
  Administered 2013-08-04 – 2013-08-05 (×2): 20 mg via ORAL
  Filled 2013-08-04 (×4): qty 2

## 2013-08-04 MED ORDER — ENOXAPARIN SODIUM 60 MG/0.6ML ~~LOC~~ SOLN
50.0000 mg | SUBCUTANEOUS | Status: DC
  Administered 2013-08-04 – 2013-08-12 (×9): 50 mg via SUBCUTANEOUS
  Filled 2013-08-04 (×9): qty 0.6

## 2013-08-04 MED ORDER — ACETAMINOPHEN 325 MG PO TABS
650.0000 mg | ORAL_TABLET | Freq: Four times a day (QID) | ORAL | Status: DC | PRN
  Administered 2013-08-05 – 2013-08-11 (×2): 650 mg via ORAL
  Filled 2013-08-04 (×3): qty 2

## 2013-08-04 MED ORDER — CYANOCOBALAMIN 500 MCG PO TABS
500.0000 ug | ORAL_TABLET | Freq: Every day | ORAL | Status: DC
  Administered 2013-08-04 – 2013-08-12 (×9): 500 ug via ORAL
  Filled 2013-08-04 (×9): qty 1

## 2013-08-04 MED ORDER — GABAPENTIN 300 MG PO CAPS
300.0000 mg | ORAL_CAPSULE | Freq: Every day | ORAL | Status: DC
  Administered 2013-08-04 – 2013-08-11 (×9): 300 mg via ORAL
  Filled 2013-08-04 (×10): qty 1

## 2013-08-04 MED ORDER — HYDROMORPHONE HCL PF 1 MG/ML IJ SOLN
1.0000 mg | INTRAMUSCULAR | Status: DC | PRN
  Administered 2013-08-04 – 2013-08-05 (×4): 1 mg via INTRAVENOUS
  Filled 2013-08-04 (×4): qty 1

## 2013-08-04 MED ORDER — SPIRONOLACTONE 25 MG PO TABS
25.0000 mg | ORAL_TABLET | Freq: Every morning | ORAL | Status: DC
  Administered 2013-08-04 – 2013-08-06 (×3): 25 mg via ORAL
  Filled 2013-08-04 (×4): qty 1

## 2013-08-04 MED ORDER — FUROSEMIDE 10 MG/ML IJ SOLN
40.0000 mg | Freq: Two times a day (BID) | INTRAMUSCULAR | Status: DC
  Administered 2013-08-04 – 2013-08-05 (×3): 40 mg via INTRAVENOUS
  Filled 2013-08-04 (×5): qty 4

## 2013-08-04 MED ORDER — DEXTROSE 5 % IV SOLN
1.0000 g | Freq: Three times a day (TID) | INTRAVENOUS | Status: DC
  Administered 2013-08-04 – 2013-08-05 (×3): 1 g via INTRAVENOUS
  Filled 2013-08-04 (×4): qty 1

## 2013-08-04 MED ORDER — CARVEDILOL 6.25 MG PO TABS
18.7500 mg | ORAL_TABLET | Freq: Two times a day (BID) | ORAL | Status: DC
  Administered 2013-08-04 – 2013-08-12 (×17): 18.75 mg via ORAL
  Filled 2013-08-04 (×22): qty 1

## 2013-08-04 NOTE — Progress Notes (Signed)
[facility] PROGRESS NOTE   Jeanette Morris WUX:[id number] DOB: [birth year] DOA: 08/03/2013 PCP: Egbert Garibaldi, NP  Assessment/Plan: Acute hypoxic respiratory failure  - Suspect multifactorial, secondary to pleural effusions and CHF.  - Unable to rule out pneumonia  - Will diurese with IV Lasix 40 mg every 12,  - I/Os, daily weights  - Last echo 8/14 with EF of 55% and grade 1 diastolic dysfunction  - Continue Coreg and Aldactone  - Will order ultrasound guided thoracentesis of the left pleural effusion, likely to be done Monday morning. Her pleural effusion has accumulated a relatively fast, I am wondering about the utility of a Pleurx cath. Appreciate oncology input on this as well.  - For now empirically cover her with antibiotics for possible pneumonia(low suspicion)  - Follow up blood cultures  Bilateral lymphedema increased pain in the right arm  -Check Dopplers to rule out DVT; preliminary results are negative. Awaiting final Chronic pain  -To chest wall and arms  -Continue oxycontin, and Dilaudid IV PRN  Metastatic breast cancer  -Off XRT  -Continue tamoxifen  -Notify Dr.Magrinat via Epic,  Hyponatremia  -Suspect hypervolemic hyponatremia  - Improved with diuresis Anemia of chronic disease  -Stable  Diet: Heart healthy Fluids: None DVT Prophylaxis: Lovenox  Code Status: Full code Family Communication: Family at bedside  Disposition Plan: Home when medically ready  Consultants:  Oncology  Procedures:  None   Antibiotics  Anti-infectives   Start     Dose/Rate Route Frequency Ordered Stop   08/04/13 2359  vancomycin (VANCOCIN) 1,500 mg in sodium chloride 0.9 % 500 mL IVPB     1,500 mg 250 mL/hr over 120 Minutes Intravenous Every 24 hours 08/03/13 2314 08/11/13 2359   08/04/13 1000  ceFEPIme (MAXIPIME) 1 g in dextrose 5 % 50 mL IVPB     1 g 100 mL/hr over 30 Minutes Intravenous Every 8 hours 08/04/13 0204     08/03/13 2330  vancomycin  (VANCOCIN) 2,000 mg in sodium chloride 0.9 % 500 mL IVPB     2,000 mg 250 mL/hr over 120 Minutes Intravenous STAT 08/03/13 2313 08/04/13 0336   08/03/13 2300  ceFEPIme (MAXIPIME) 1 g in dextrose 5 % 50 mL IVPB     1 g 100 mL/hr over 30 Minutes Intravenous  Once 08/03/13 2257 08/04/13 0215     Antibiotics Given (last 72 hours)   Date/Time Action Medication Dose Rate   08/04/13 0136 Given   vancomycin (VANCOCIN) 2,000 mg in sodium chloride 0.9 % 500 mL IVPB 2,000 mg 250 mL/hr   08/04/13 0145 Given   ceFEPIme (MAXIPIME) 1 g in dextrose 5 % 50 mL IVPB 1 g 100 mL/hr   08/04/13 1029 Given   ceFEPIme (MAXIPIME) 1 g in dextrose 5 % 50 mL IVPB 1 g 100 mL/hr      HPI/Subjective: - Feeling better this morning, breathing better.  Objective: Filed Vitals:   08/03/13 1959 08/04/13 0017 08/04/13 0102 08/04/13 0617  BP: 153/75 150/58 151/77 140/55  Pulse: 93 93 88 92  Temp: 99.1 F (37.3 C)  97.9 F (36.6 C) 97.5 F (36.4 C)  TempSrc: Oral  Oral Oral  Resp: 22 17 22 20   Height: 5\' 4"  (1.626 m)  5\' 4"  (1.626 m)   Weight: 106.595 kg (235 lb)  106 kg (233 lb 11 oz)   SpO2: 98% 100% 100% 100%    Intake/Output Summary (Last 24 hours) at 08/04/13 1406 Last data filed at 08/04/13 1029  Gross per  24 hour  Intake    890 ml  Output      1 ml  Net    889 ml   Filed Weights   08/03/13 1959 08/04/13 0102  Weight: 106.595 kg (235 lb) 106 kg (233 lb 11 oz)    Exam:   General:  NAD  Cardiovascular: regular rate and rhythm, without MRG  Respiratory: No wheezing, decreased breath sounds at the bases  Abdomen: soft, not tender to palpation, positive bowel sounds  MSK: Trace peripheral edema  Neuro: Grossly nonfocal  Data Reviewed: Basic Metabolic Panel:  Recent Labs Lab 08/03/13 2100 08/04/13 0500  NA 128* 131*  K 4.3 4.2  CL 95* 97  CO2 28 26  GLUCOSE 112* 102*  BUN 20 18  CREATININE 1.19* 1.14*  CALCIUM 8.8 8.6   CBC:  Recent Labs Lab 08/03/13 2100 08/04/13 0500   WBC 3.5* 3.8*  NEUTROABS 2.2  --   HGB 9.3* 8.9*  HCT 27.8* 27.4*  MCV 90.0 90.7  PLT 216 201   BNP (last 3 results)  Recent Labs  08/03/13 2100  PROBNP 612.9*   Studies: Dg Chest 2 View  08/03/2013   CLINICAL DATA:  Dyspnea.  History of smoking.  EXAM: CHEST  2 VIEW  COMPARISON:  Chest radiograph performed 07/26/2013  FINDINGS: The lungs are hypoexpanded. Moderate left and small right pleural effusions are seen, with associated left basilar airspace opacification. Underlying vascular crowding and vascular congestion are seen. This may reflect pneumonia or possibly asymmetric pulmonary edema. No pneumothorax is identified.  The heart remains normal in size. A left-sided subclavian line is noted ending about the mid SVC. No acute osseous abnormalities are identified. Clips are seen at the axilla bilaterally.  IMPRESSION: Lungs hypoexpanded. Moderate left and small right pleural effusions, with associated left basilar airspace opacification. This may reflect pneumonia or possibly asymmetric pulmonary edema.   Electronically Signed   By: Roanna Raider M.D.   On: 08/03/2013 21:38    Scheduled Meds: . carvedilol  18.75 mg Oral BID WC  . ceFEPime (MAXIPIME) IV  1 g Intravenous Q8H  . cyanocobalamin  500 mcg Oral Daily  . enoxaparin (LOVENOX) injection  50 mg Subcutaneous Q24H  . ferrous sulfate  325 mg Oral QPC breakfast  . furosemide  40 mg Intravenous Q12H  . gabapentin  300 mg Oral QHS  . OxyCODONE  20 mg Oral Q12H  . spironolactone  25 mg Oral q morning - 10a  . tamoxifen  20 mg Oral q morning - 10a  . vancomycin  1,500 mg Intravenous Q24H   Continuous Infusions:   Principal Problem:   Pleural effusion Active Problems:   Cardiomyopathy due to chemotherapy   Breast cancer metastasized to bone   Pneumonia   CHF exacerbation  Time spent: 25  Pamella Pert, MD [facility] Pager [phone]. If 7 PM - 7 AM, please contact night-coverage at [email or web address], password  [facility] 08/04/2013, 2:06 PM  LOS: 1 day

## 2013-08-04 NOTE — Progress Notes (Signed)
*  PRELIMINARY RESULTS* Vascular Ultrasound Upper extremity venous duplex bilateral has been completed.  Preliminary findings: Technically limited due to body habitus and edema. No obvious evidence of deep or superficial thrombosis noted.  Farrel Demark, RDMS, RVT  08/04/2013, 8:14 AM

## 2013-08-05 ENCOUNTER — Ambulatory Visit: Payer: PRIVATE HEALTH INSURANCE

## 2013-08-05 ENCOUNTER — Inpatient Hospital Stay (HOSPITAL_COMMUNITY): Payer: PRIVATE HEALTH INSURANCE

## 2013-08-05 LAB — BASIC METABOLIC PANEL
BUN: 23 mg/dL (ref 6–23)
Chloride: 96 mEq/L (ref 96–112)
Glucose, Bld: 100 mg/dL — ABNORMAL HIGH (ref 70–99)
Potassium: 4.6 mEq/L (ref 3.5–5.1)

## 2013-08-05 LAB — CBC
HCT: 25.1 % — ABNORMAL LOW (ref 36.0–46.0)
Hemoglobin: 8.2 g/dL — ABNORMAL LOW (ref 12.0–15.0)
MCHC: 32.7 g/dL (ref 30.0–36.0)
RBC: 2.74 MIL/uL — ABNORMAL LOW (ref 3.87–5.11)

## 2013-08-05 MED ORDER — SODIUM CHLORIDE 0.9 % IJ SOLN
10.0000 mL | INTRAMUSCULAR | Status: DC | PRN
  Administered 2013-08-05: 10 mL

## 2013-08-05 MED ORDER — LEVOFLOXACIN 500 MG PO TABS
500.0000 mg | ORAL_TABLET | Freq: Every day | ORAL | Status: DC
  Administered 2013-08-05 – 2013-08-12 (×8): 500 mg via ORAL
  Filled 2013-08-05 (×8): qty 1

## 2013-08-05 MED ORDER — ONDANSETRON 4 MG PO TBDP
4.0000 mg | ORAL_TABLET | Freq: Three times a day (TID) | ORAL | Status: DC | PRN
  Administered 2013-08-05 – 2013-08-12 (×10): 4 mg via ORAL
  Filled 2013-08-05 (×10): qty 1

## 2013-08-05 MED ORDER — FUROSEMIDE 40 MG PO TABS
40.0000 mg | ORAL_TABLET | Freq: Every day | ORAL | Status: DC
  Administered 2013-08-06: 10:00:00 40 mg via ORAL
  Filled 2013-08-05 (×2): qty 1

## 2013-08-05 NOTE — Procedures (Signed)
   US guided L thora  700 cc yellow fluid Pt tolerated well  cxr pending

## 2013-08-05 NOTE — Progress Notes (Signed)
COURTESY NOTE:  Called by Dr Wyonia Hough re patient's admission and her L thoracentesis. I saw Jeanette Morris "unofficially" last week while she was downstairs for Radiation. Her functional status seemed much worse than I remembered and Dr Roselind Messier and I felt it would be prudent to hold further radiaiton for now, give the patient a little time to recover, then consider returning to systemic therapy. She was scheduled to see [place] DEC 10 to discuss this but I will see her early DEC 9 and will review the overall plan with her.  Appreciate your help to this patient!  67 y.o. [place] woman with stage 4 breast cancer  (1) status post RIGHT axillary lymph node biopsy 05/27/2010 for a clinical T4 N1, stage IIIB (inflammatory) invasive ductal carcinoma, grade not stated, estrogen receptor 94% positive, progesterone receptor 64% positive, with an MIB-1 of 60%, and HER-2 amplification by FISH with a ratio of 7.5.  (2) neoadjuvant chemotherapy consisted of 6 cycles of standard carboplatin, docetaxel, trastuzumab, with the trastuzumab held cycles 5 and 6 due to a drop in her borderline ejection fraction (from 45-50% at baseline to 40-45% January 2012)-- total trastuzumab treatment = 2 months  (3) status post right mastectomy and axillary lymph node dissection 12/01/2010, showing no residual invasive carcinoma in the breast, but residual tumor in 20 of 21 axillary lymph nodes. (Only one of the 20 positive lymph nodes showed a significant residual tumor, measuring 5 mm; the remaining lymph nodes, including the evidence of extracapsular extension, showed single and clusters a few neoplastic cells with extensive therapy related changes).  (4) postmastectomy radiation to the right chest wall, axilla, and supraclavicular region completed 03/07/2011  (5) additional 2 months of trastuzumab given February through April 2013, again discontinued because of a drop in the ejection fraction and lateral S'  (6) on letrozole July 2012 to  September 2013  (7) LEFT axillary adenopathy noted by Dr. Carolynne Edouard on exam July 2013, with biopsy of a left axillary node 04/17/2012 showing an invasive ductal carcinoma, estrogen receptor 91% positive, progesterone receptor 9% positive, with an MIB-1 of 45%, and no HER-2 amplification.  (8) PET scan 04/03/2012 showed multiple enlarged left axillary and small left supraclavicular lymph nodes that are moderately hypermetabolic, with some hypermetabolic activity within the left axillary tail. No other hypermetabolic lymph nodes identified. There was no involvement of the right axilla, mediastinum or right chest wall and no extrathoracic metastases were demonstrated.  (8) on exemestane and everolimus September through December 2013  (9) s/p RIGHT thoracentesis 08/01/2012, cytologically positive; s/p temporary Pleurx; effusion resolved after Abraxane therapy  (10) Abraxane started 08/16/2012, given day 1 and day 8 of each 21 day cycle, completed 6 cycles (12 doses) 12/06/2012, with excellent response  (11) fulvestrant started 12/19/2012, stopped 04/11/2013 with progression  (12) trastuzumab resumed 12/21/2011. We scheduled the doses 4 weeks apart to see if they caould be better tolerated. However echo 02/04/2013 again showed a drop in the EF and S', so Herceptin stopped (last dose 01/17/2013). Most recent echo, 04/18/2013, shows the ejection fraction to have recovered.  (13) status post left modified radical mastectomy 05/08/2013 for a pT4, pN2a [tumor extended over 15 cm, all 6 sampled lymph nodes were involved) invasive lobular breast cancer, grade 3, which was HER-2 negative. It was estrogen receptor positive at 72%, progesterone receptor negative. Posterior margin was broadly positive  (14) tamoxifen started 05/29/2013  (15) post-mastectomy irradiation to left chest wall and left [place] fossa, beginning 07/09/2013, interrupted 07/30/2013 (16) new baseline  staging scans including a chest CT and PET scan were  obtained 07/02/2013. These showed definite progression of disease, but that is when compared to previous scans in August 2013.

## 2013-08-05 NOTE — Progress Notes (Signed)
[facility] PROGRESS NOTE   Jeanette Morris ZOX:[id number] DOB: Sep 28, 1945 DOA: 08/03/2013 PCP: Egbert Garibaldi, NP  Assessment/Plan: Acute hypoxic respiratory failure - Suspect multifactorial, secondary to pleural effusions and CHF.  - unable to r/o PNA, on levofloxacin.  - Will diurese with IV Lasix 40 mg every 12, change to po today - I/Os, daily weights; weight down 3 lbs this morning - Last echo 8/14 with EF of 55% and grade 1 diastolic dysfunction  - Continue Coreg and Aldactone  - US guided thoracentesis of the left pleural effusion today.  - Her pleural effusion has accumulated a relatively fast, I am wondering about the utility of a Pleurx cath. Appreciate oncology input on this as well.  - Follow up blood cultures  Bilateral lymphedema increased pain in the right arm  -Check Dopplers to rule out DVT; preliminary results are negative. Awaiting final Chronic pain  -To chest wall and arms  -Continue oxycontin, and Dilaudid IV PRN  Metastatic breast cancer  -Off XRT  -Continue tamoxifen  -Notify Dr.Magrinat via Epic Hyponatremia  -Suspect hypervolemic hyponatremia  - Improved with diuresis Anemia of chronic disease  -Stable  Diet: Heart healthy Fluids: None DVT Prophylaxis: Lovenox  Code Status: Full code Family Communication: Family at bedside  Disposition Plan: Home when medically ready  Consultants:  Oncology  Procedures:  None   Antibiotics  Anti-infectives   Start     Dose/Rate Route Frequency Ordered Stop   08/05/13 1000  levofloxacin (LEVAQUIN) tablet 500 mg     500 mg Oral Daily 08/05/13 0737     08/04/13 2359  vancomycin (VANCOCIN) 1,500 mg in sodium chloride 0.9 % 500 mL IVPB  Status:  Discontinued     1,500 mg 250 mL/hr over 120 Minutes Intravenous Every 24 hours 08/03/13 2314 08/05/13 0737   08/04/13 1000  ceFEPIme (MAXIPIME) 1 g in dextrose 5 % 50 mL IVPB  Status:  Discontinued     1 g 100 mL/hr over 30 Minutes Intravenous Every 8  hours 08/04/13 0204 08/05/13 0737   08/03/13 2330  vancomycin (VANCOCIN) 2,000 mg in sodium chloride 0.9 % 500 mL IVPB     2,000 mg 250 mL/hr over 120 Minutes Intravenous STAT 08/03/13 2313 08/04/13 0336   08/03/13 2300  ceFEPIme (MAXIPIME) 1 g in dextrose 5 % 50 mL IVPB     1 g 100 mL/hr over 30 Minutes Intravenous  Once 08/03/13 2257 08/04/13 0215     Antibiotics Given (last 72 hours)   Date/Time Action Medication Dose Rate   08/04/13 0136 Given   vancomycin (VANCOCIN) 2,000 mg in sodium chloride 0.9 % 500 mL IVPB 2,000 mg 250 mL/hr   08/04/13 0145 Given   ceFEPIme (MAXIPIME) 1 g in dextrose 5 % 50 mL IVPB 1 g 100 mL/hr   08/04/13 1029 Given   ceFEPIme (MAXIPIME) 1 g in dextrose 5 % 50 mL IVPB 1 g 100 mL/hr   08/04/13 1744 Given   ceFEPIme (MAXIPIME) 1 g in dextrose 5 % 50 mL IVPB 1 g 100 mL/hr   08/04/13 2311 Given   vancomycin (VANCOCIN) 1,500 mg in sodium chloride 0.9 % 500 mL IVPB 1,500 mg 250 mL/hr   08/05/13 0156 Given   ceFEPIme (MAXIPIME) 1 g in dextrose 5 % 50 mL IVPB 1 g 100 mL/hr     HPI/Subjective: - feeling "about the same" this morning  Objective: Filed Vitals:   08/04/13 0617 08/04/13 1526 08/04/13 2030 08/05/13 0529  BP: 140/55 118/58 148/70 156/87  Pulse: 92 78 84 85  Temp: 97.5 F (36.4 C) 97.7 F (36.5 C) 97.7 F (36.5 C) 98 F (36.7 C)  TempSrc: Oral Oral Oral Oral  Resp: 20 18 20 18   Height:      Weight:    105.4 kg (232 lb 5.8 oz)  SpO2: 100% 100% 100% 100%    Intake/Output Summary (Last 24 hours) at 08/05/13 1037 Last data filed at 08/05/13 1610  Gross per 24 hour  Intake   1150 ml  Output    927 ml  Net    223 ml   Filed Weights   08/03/13 1959 08/04/13 0102 08/05/13 0529  Weight: 106.595 kg (235 lb) 106 kg (233 lb 11 oz) 105.4 kg (232 lb 5.8 oz)   Exam:   General:  NAD  Cardiovascular: regular rate and rhythm, without MRG  Respiratory: No wheezing, decreased breath sounds at the bases  Abdomen: soft, not tender to palpation,  positive bowel sounds  MSK: Trace peripheral edema  Neuro: Grossly nonfocal  Data Reviewed: Basic Metabolic Panel:  Recent Labs Lab 08/03/13 2100 08/04/13 0500 08/05/13 0503  NA 128* 131* 130*  K 4.3 4.2 4.6  CL 95* 97 96  CO2 28 26 29   GLUCOSE 112* 102* 100*  BUN 20 18 23   CREATININE 1.19* 1.14* 1.51*  CALCIUM 8.8 8.6 8.5   CBC:  Recent Labs Lab 08/03/13 2100 08/04/13 0500 08/05/13 0503  WBC 3.5* 3.8* 2.7*  NEUTROABS 2.2  --   --   HGB 9.3* 8.9* 8.2*  HCT 27.8* 27.4* 25.1*  MCV 90.0 90.7 91.6  PLT 216 201 193   BNP (last 3 results)  Recent Labs  08/03/13 2100  PROBNP 612.9*   Studies: Dg Chest 2 View  08/03/2013   CLINICAL DATA:  Dyspnea.  History of smoking.  EXAM: CHEST  2 VIEW  COMPARISON:  Chest radiograph performed 07/26/2013  FINDINGS: The lungs are hypoexpanded. Moderate left and small right pleural effusions are seen, with associated left basilar airspace opacification. Underlying vascular crowding and vascular congestion are seen. This may reflect pneumonia or possibly asymmetric pulmonary edema. No pneumothorax is identified.  The heart remains normal in size. A left-sided subclavian line is noted ending about the mid SVC. No acute osseous abnormalities are identified. Clips are seen at the axilla bilaterally.  IMPRESSION: Lungs hypoexpanded. Moderate left and small right pleural effusions, with associated left basilar airspace opacification. This may reflect pneumonia or possibly asymmetric pulmonary edema.   Electronically Signed   By: Roanna Raider M.D.   On: 08/03/2013 21:38    Scheduled Meds: . carvedilol  18.75 mg Oral BID WC  . cyanocobalamin  500 mcg Oral Daily  . enoxaparin (LOVENOX) injection  50 mg Subcutaneous Q24H  . ferrous sulfate  325 mg Oral QPC breakfast  . [START ON 08/06/2013] furosemide  40 mg Oral Daily  . gabapentin  300 mg Oral QHS  . levofloxacin  500 mg Oral Daily  . OxyCODONE  20 mg Oral Q12H  . spironolactone  25 mg Oral  q morning - 10a  . tamoxifen  20 mg Oral q morning - 10a   Continuous Infusions:   Principal Problem:   Pleural effusion Active Problems:   Cardiomyopathy due to chemotherapy   Breast cancer metastasized to bone   Pneumonia   CHF exacerbation  Time spent: 25  Pamella Pert, MD [facility] Pager [phone]. If 7 PM - 7 AM, please contact night-coverage at [email or web address], password [facility] 08/05/2013,  10:37 AM  LOS: 2 days

## 2013-08-05 NOTE — Progress Notes (Signed)
Complained  pain from right to left chect 10/10 pain scale. S/p post thoracenthesis , puncture site with band aid on dry and intact.

## 2013-08-06 ENCOUNTER — Ambulatory Visit: Payer: PRIVATE HEALTH INSURANCE

## 2013-08-06 DIAGNOSIS — C773 Secondary and unspecified malignant neoplasm of axilla and upper limb lymph nodes: Secondary | ICD-10-CM

## 2013-08-06 DIAGNOSIS — Z170 Estrogen receptor positive status [ER+]: Secondary | ICD-10-CM

## 2013-08-06 LAB — BASIC METABOLIC PANEL
BUN: 25 mg/dL — ABNORMAL HIGH (ref 6–23)
Calcium: 8.5 mg/dL (ref 8.4–10.5)
Chloride: 96 mEq/L (ref 96–112)
GFR calc non Af Amer: 33 mL/min — ABNORMAL LOW (ref >90)
Glucose, Bld: 99 mg/dL (ref 70–99)

## 2013-08-06 LAB — CBC
HCT: 25.4 % — ABNORMAL LOW (ref 36.0–46.0)
Hemoglobin: 8.3 g/dL — ABNORMAL LOW (ref 12.0–15.0)
MCH: 29.9 pg (ref 26.0–34.0)
MCHC: 32.7 g/dL (ref 30.0–36.0)
MCV: 91.4 fL (ref 78.0–100.0)
RBC: 2.78 MIL/uL — ABNORMAL LOW (ref 3.87–5.11)
WBC: 3.4 10*3/uL — ABNORMAL LOW (ref 4.0–10.5)

## 2013-08-06 MED ORDER — MAGNESIUM CITRATE PO SOLN
1.0000 | Freq: Once | ORAL | Status: AC
  Administered 2013-08-06: 1 via ORAL

## 2013-08-06 MED ORDER — OXYCODONE HCL 5 MG PO TABS
5.0000 mg | ORAL_TABLET | ORAL | Status: DC | PRN
  Administered 2013-08-06 – 2013-08-12 (×12): 5 mg via ORAL
  Filled 2013-08-06 (×14): qty 1

## 2013-08-06 MED ORDER — POLYETHYLENE GLYCOL 3350 17 G PO PACK
17.0000 g | PACK | Freq: Every day | ORAL | Status: DC
  Administered 2013-08-07 – 2013-08-09 (×2): 17 g via ORAL
  Filled 2013-08-06 (×7): qty 1

## 2013-08-06 NOTE — Evaluation (Signed)
Physical Therapy Evaluation Patient Details Name: Jeanette Morris MRN: [id number] DOB: 1946/04/17 Today's Date: 08/06/2013 Time: 1191-4782 PT Time Calculation (min): 23 min  PT Assessment / Plan / Recommendation History of Present Illness  67 yo female admitted with pleural effuison. s/p thoracentesis 12/8. Hx of met breast cancer, bil mastectomies, bil UE lymphedema.   Clinical Impression  On eval, pt required Min assist for mobility-able to ambulate ~125 feet with walker. Demonstrates general weakness, decreased activity tolerance, impaired gait and balance. Recommend HHPT and 24 hour supervision. Husband present and states family will continue to provide care for pt at home Pt reports history of lymphedema and difficulty with ADLS, so asked RN to place order for OT consult.     PT Assessment  Patient needs continued PT services    Follow Up Recommendations  Home health PT;Supervision/Assistance - 24 hour    Does the patient have the potential to tolerate intense rehabilitation      Barriers to Discharge        Equipment Recommendations  Rolling walker with 5" wheels    Recommendations for Other Services OT consult   Frequency Min 3X/week    Precautions / Restrictions Precautions Precautions: Fall Restrictions Weight Bearing Restrictions: No   Pertinent Vitals/Pain O2: at rest 87-91% on RA. Sats drop lower with activity so replaced [place] O2 for remainder of session. 96% 2L O2      Mobility  Bed Mobility Bed Mobility: Supine to Sit Supine to Sit: HOB elevated;With rails;4: Min assist Details for Bed Mobility Assistance: Increased time. Assist for trunk to full upright Transfers Transfers: Sit to Stand;Stand to Sit Sit to Stand: 4: Min assist;From bed Stand to Sit: 4: Min assist;To chair/3-in-1 Details for Transfer Assistance: assist to rise, stabilize, control descent. vcs safety,hand placement Ambulation/Gait Ambulation/Gait Assistance: 4: Min assist Ambulation  Distance (Feet): 125 Feet Assistive device: Rolling walker Ambulation/Gait Assistance Details: slow gait speed. dyspnea 2/4 with ambulation. remained on 2L o2.  Gait Pattern: Step-through pattern    Exercises     PT Diagnosis: Difficulty walking;Generalized weakness  PT Problem List: Decreased strength;Decreased activity tolerance;Decreased balance;Decreased mobility;Decreased knowledge of use of DME;Obesity PT Treatment Interventions: DME instruction;Gait training;Therapeutic activities;Functional mobility training;Therapeutic exercise;Patient/family education;Balance training     PT Goals(Current goals can be found in the care plan section) Acute Rehab PT Goals Patient Stated Goal: home soon PT Goal Formulation: With patient/family Time For Goal Achievement: 08/20/13 Potential to Achieve Goals: Good  Visit Information  Last PT Received On: 08/06/13 Assistance Needed: +1 History of Present Illness: 67 yo female admitted with pleural effuison. s/p thoracentesis 12/8. Hx of met breast cancer, bil mastectomies, bil UE lymphedema.        Prior Functioning  Home Living Family/patient expects to be discharged to:: Private residence Living Arrangements: Spouse/significant other Available Help at Discharge: Family Type of Home: House Home Access: Stairs to enter Secretary/administrator of Steps: 2 Entrance Stairs-Rails: None Home Layout: One level Home Equipment: None Prior Function Level of Independence: Needs assistance Communication Communication: No difficulties    Cognition  Cognition Arousal/Alertness: Awake/alert Behavior During Therapy: WFL for tasks assessed/performed Overall Cognitive Status: Within Functional Limits for tasks assessed    Extremity/Trunk Assessment Upper Extremity Assessment Upper Extremity Assessment: LUE deficits/detail;RUE deficits/detail;Generalized weakness RUE Deficits / Details: lymphedema LUE Deficits / Details: lymphedema Lower Extremity  Assessment Lower Extremity Assessment: Generalized weakness Cervical / Trunk Assessment Cervical / Trunk Assessment: Normal   Balance    End of Session PT - End of  Session Equipment Utilized During Treatment: Gait belt;Oxygen Activity Tolerance: Patient limited by fatigue Patient left: in chair;with call bell/phone within reach;with family/visitor present  GP     Rebeca Alert, MPT Pager: [phone]

## 2013-08-06 NOTE — Progress Notes (Signed)
Jeanette Morris   DOB:May 13, 1946   ZO#:[id number]   JWJ#:[id number]  Subjective: she's breathing better but still not getting OOB except to go to the BR, and that with assistance. Her husband, present this AM, is very concerned that she not go home "before she's ready." She denmies unusual headaches, nausea, vomiting, or visual changes. She is SOB with activity. Pain is well controlled on current meds. No Bm several days.   Objective: middle aged [place] American woman examined in bed Filed Vitals:   08/06/13 0458  BP: 124/55  Pulse: 81  Temp: 98.4 F (36.9 C)  Resp: 18    Body mass index is 39.75 kg/(m^2).  Intake/Output Summary (Last 24 hours) at 08/06/13 1306 Last data filed at 08/06/13 6213  Gross per 24 hour  Intake    480 ml  Output    300 ml  Net    180 ml     Sclerae unicteric  Oropharynx no thrush  No cervical or supraclavicular adenopathy  Lungs clear -- auscultated anterolaterally--poor excursion  Heart regular rate and rhythm  Abdomen obese, benign  MSK bilateral UE edema  Neuro nonfocal, well oriented, positive affect  Breast exam: deferred  CBG (last 3)  No results found for this basename: GLUCAP,  in the last 72 hours   Labs:  Lab Results  Component Value Date   WBC 3.4* 08/06/2013   HGB 8.3* 08/06/2013   HCT 25.4* 08/06/2013   MCV 91.4 08/06/2013   PLT 192 08/06/2013   NEUTROABS 2.2 08/03/2013    @LASTCHEMISTRY @  Urine Studies No results found for this basename: UACOL, UAPR, USPG, UPH, UTP, UGL, UKET, UBIL, UHGB, UNIT, UROB, ULEU, UEPI, UWBC, URBC, UBAC, CAST, CRYS, UCOM, BILUA,  in the last 72 hours  Basic Metabolic Panel:  Recent Labs Lab 08/03/13 2100 08/04/13 0500 08/05/13 0503 08/06/13 0415  NA 128* 131* 130* 130*  K 4.3 4.2 4.6 4.8  CL 95* 97 96 96  CO2 28 26 29 28   GLUCOSE 112* 102* 100* 99  BUN 20 18 23  25*  CREATININE 1.19* 1.14* 1.51* 1.56*  CALCIUM 8.8 8.6 8.5 8.5   GFR Estimated Creatinine Clearance: 40.2 ml/min (by C-G formula  based on Cr of 1.56). Liver Function Tests: No results found for this basename: AST, ALT, ALKPHOS, BILITOT, PROT, ALBUMIN,  in the last 168 hours No results found for this basename: LIPASE, AMYLASE,  in the last 168 hours No results found for this basename: AMMONIA,  in the last 168 hours Coagulation profile No results found for this basename: INR, PROTIME,  in the last 168 hours  CBC:  Recent Labs Lab 08/03/13 2100 08/04/13 0500 08/05/13 0503 08/06/13 0415  WBC 3.5* 3.8* 2.7* 3.4*  NEUTROABS 2.2  --   --   --   HGB 9.3* 8.9* 8.2* 8.3*  HCT 27.8* 27.4* 25.1* 25.4*  MCV 90.0 90.7 91.6 91.4  PLT 216 201 193 192   Cardiac Enzymes: No results found for this basename: CKTOTAL, CKMB, CKMBINDEX, TROPONINI,  in the last 168 hours BNP: No components found with this basename: POCBNP,  CBG: No results found for this basename: GLUCAP,  in the last 168 hours D-Dimer No results found for this basename: DDIMER,  in the last 72 hours Hgb A1c No results found for this basename: HGBA1C,  in the last 72 hours Lipid Profile No results found for this basename: CHOL, HDL, LDLCALC, TRIG, CHOLHDL, LDLDIRECT,  in the last 72 hours Thyroid function studies No results found for  this basename: TSH, T4TOTAL, FREET3, T3FREE, THYROIDAB,  in the last 72 hours Anemia work up No results found for this basename: VITAMINB12, FOLATE, FERRITIN, TIBC, IRON, RETICCTPCT,  in the last 72 hours Microbiology Recent Results (from the past 240 hour(s))  CULTURE, BLOOD (ROUTINE X 2)     Status: None   Collection Time    08/03/13 11:45 PM      Result Value Range Status   Specimen Description BLOOD PORTA CATH   Final   Special Requests BOTTLES DRAWN AEROBIC AND ANAEROBIC 5CC   Final   Culture  Setup Time     Final   Value: 08/04/2013 13:55     Performed at [facility]   Culture     Final   Value:        BLOOD CULTURE RECEIVED NO GROWTH TO DATE CULTURE WILL BE HELD FOR 5 DAYS BEFORE ISSUING A FINAL  NEGATIVE REPORT     Performed at [facility]   Report Status PENDING   Incomplete  CULTURE, BLOOD (ROUTINE X 2)     Status: None   Collection Time    08/04/13 12:01 AM      Result Value Range Status   Specimen Description BLOOD LEFT FOOT   Final   Special Requests BOTTLES DRAWN AEROBIC AND ANAEROBIC 5CC   Final   Culture  Setup Time     Final   Value: 08/04/2013 13:55     Performed at [facility]   Culture     Final   Value:        BLOOD CULTURE RECEIVED NO GROWTH TO DATE CULTURE WILL BE HELD FOR 5 DAYS BEFORE ISSUING A FINAL NEGATIVE REPORT     Performed at [facility]   Report Status PENDING   Incomplete      Studies:  Dg Chest 1 View  08/05/2013   CLINICAL DATA:  Status post left thoracentesis.  EXAM: CHEST - 1 VIEW  COMPARISON:  Chest radiographs 08/03/2013  FINDINGS: Left subclavian central venous catheter remains in place with tip overlying the mid SVC, unchanged. The cardiomediastinal silhouette is within normal limits. The patient has taken a slightly shallower inspiration than on the prior study. Left pleural effusion has decreased in size. Small right pleural effusion does not appear significantly changed. No definite pneumothorax is identified. Mild central pulmonary vascular congestion is unchanged. Surgical clips are again noted in both axillae.  IMPRESSION: Decreased size of left pleural effusion. No pneumothorax identified.   Electronically Signed   By: Sebastian Ache   On: 08/05/2013 11:12   US Thoracentesis Asp Pleural Space W/img Guide  08/05/2013   CLINICAL DATA:  Left pleural effusion  EXAM: ULTRASOUND GUIDED left THORACENTESIS  COMPARISON:  None  FINDINGS: A total of approximately 700 cc of yellow fluid was removed. A fluid sample was notsent for laboratory analysis.  IMPRESSION: Successful ultrasound guided left thoracentesis yielding 700 cc of pleural fluid.  Read by: Beckey Downing PA-C  PROCEDURE: An ultrasound guided thoracentesis was thoroughly  discussed with the patient and questions answered. The benefits, risks, alternatives and complications were also discussed. The patient understands and wishes to proceed with the procedure. Written consent was obtained.  Ultrasound was performed to localize and mark an adequate pocket of fluid in the left chest. The area was then prepped and draped in the normal sterile fashion. 1% Lidocaine was used for local anesthesia. Under ultrasound guidance a 19 gauge Yueh catheter was introduced. Thoracentesis was  performed. The catheter was removed and a dressing applied.  Complications:  None   Electronically Signed   By: Simonne Come M.D.   On: 08/05/2013 11:08    Assessment: 67 y.o. [place] woman with stage 4 breast cancer  (1) status post RIGHT axillary lymph node biopsy 05/27/2010 for a clinical T4 N1, stage IIIB (inflammatory) invasive ductal carcinoma, grade not stated, estrogen receptor 94% positive, progesterone receptor 64% positive, with an MIB-1 of 60%, and HER-2 amplification by FISH with a ratio of 7.5.  (2) neoadjuvant chemotherapy consisted of 6 cycles of standard carboplatin, docetaxel, trastuzumab, with the trastuzumab held cycles 5 and 6 due to a drop in her borderline ejection fraction (from 45-50% at baseline to 40-45% January 2012)-- total trastuzumab treatment = 2 months  (3) status post right mastectomy and axillary lymph node dissection 12/01/2010, showing no residual invasive carcinoma in the breast, but residual tumor in 20 of 21 axillary lymph nodes. (Only one of the 20 positive lymph nodes showed a significant residual tumor, measuring 5 mm; the remaining lymph nodes, including the evidence of extracapsular extension, showed single and clusters a few neoplastic cells with extensive therapy related changes).  (4) postmastectomy radiation to the right chest wall, axilla, and supraclavicular region completed 03/07/2011  (5) additional 2 months of trastuzumab given February through April  2013, again discontinued because of a drop in the ejection fraction and lateral S'  (6) on letrozole July 2012 to September 2013  (7) LEFT axillary adenopathy noted by Dr. Carolynne Edouard on exam July 2013, with biopsy of a left axillary node 04/17/2012 showing an invasive ductal carcinoma, estrogen receptor 91% positive, progesterone receptor 9% positive, with an MIB-1 of 45%, and no HER-2 amplification.  (8) PET scan 04/03/2012 showed multiple enlarged left axillary and small left supraclavicular lymph nodes that are moderately hypermetabolic, with some hypermetabolic activity within the left axillary tail. No other hypermetabolic lymph nodes identified. There was no involvement of the right axilla, mediastinum or right chest wall and no extrathoracic metastases were demonstrated.  (8) on exemestane and everolimus September through December 2013  (9) s/p RIGHT thoracentesis 08/01/2012, cytologically positive; s/p temporary Pleurx; effusion resolved after Abraxane therapy  (10) Abraxane started 08/16/2012, given day 1 and day 8 of each 21 day cycle, completed 6 cycles (12 doses) 12/06/2012, with excellent response  (11) fulvestrant started 12/19/2012, stopped 04/11/2013 with progression  (12) trastuzumab resumed 12/21/2011. We scheduled the doses 4 weeks apart to see if they caould be better tolerated. However echo 02/04/2013 again showed a drop in the EF and S', so Herceptin stopped (last dose 01/17/2013). Most recent echo, 04/18/2013, shows the ejection fraction to have recovered.  (13) status post left modified radical mastectomy 05/08/2013 for a pT4, pN2a [tumor extended over 15 cm, all 6 sampled lymph nodes were involved) invasive lobular breast cancer, grade 3, which was HER-2 negative. It was estrogen receptor positive at 72%, progesterone receptor negative. Posterior margin was broadly positive  (14) tamoxifen started 05/29/2013  (15) post-mastectomy irradiation to left chest wall and left  fossa,  beginning 07/09/2013, interrupted 07/30/2013  (16) new baseline staging scans including a chest CT and PET scan were obtained 07/02/2013. These showed definite progression of disease,  when compared to previous scans in August 2013. (17) s/p left thoracentesis 08/05/2013, cytology pending  Plan: Ashten is breathing a little better but she is "nowhere near" going home. We are waiting on Rehab eval. Today we discussed resumption of chemotherapy and we could resume Abraxane  this admission, [facility] being another option. I will try to get her transferred to [facility]. Meanwhile we'll try for a BM today. I am changing her dilaudid to oxycodone as using a single narcotic minimizes side effects.   Appreciate the help of Dr Doran Heater and the hospitalist service!   Lowella Dell, MD 08/06/2013  1:06 PM

## 2013-08-07 ENCOUNTER — Other Ambulatory Visit: Payer: PRIVATE HEALTH INSURANCE

## 2013-08-07 ENCOUNTER — Other Ambulatory Visit: Payer: Self-pay | Admitting: Oncology

## 2013-08-07 ENCOUNTER — Ambulatory Visit: Payer: PRIVATE HEALTH INSURANCE

## 2013-08-07 ENCOUNTER — Ambulatory Visit: Payer: PRIVATE HEALTH INSURANCE | Admitting: Physician Assistant

## 2013-08-07 ENCOUNTER — Inpatient Hospital Stay (HOSPITAL_COMMUNITY): Payer: PRIVATE HEALTH INSURANCE

## 2013-08-07 DIAGNOSIS — C50412 Malignant neoplasm of upper-outer quadrant of left female breast: Secondary | ICD-10-CM

## 2013-08-07 DIAGNOSIS — C50411 Malignant neoplasm of upper-outer quadrant of right female breast: Secondary | ICD-10-CM

## 2013-08-07 DIAGNOSIS — D630 Anemia in neoplastic disease: Secondary | ICD-10-CM

## 2013-08-07 DIAGNOSIS — N179 Acute kidney failure, unspecified: Secondary | ICD-10-CM

## 2013-08-07 DIAGNOSIS — C50919 Malignant neoplasm of unspecified site of unspecified female breast: Secondary | ICD-10-CM

## 2013-08-07 DIAGNOSIS — E875 Hyperkalemia: Secondary | ICD-10-CM

## 2013-08-07 DIAGNOSIS — J910 Malignant pleural effusion: Secondary | ICD-10-CM

## 2013-08-07 DIAGNOSIS — R52 Pain, unspecified: Secondary | ICD-10-CM

## 2013-08-07 LAB — CBC WITH DIFFERENTIAL/PLATELET
Basophils Absolute: 0 K/uL (ref 0.0–0.1)
Basophils Relative: 0 % (ref 0–1)
Eosinophils Absolute: 0.1 K/uL (ref 0.0–0.7)
Eosinophils Relative: 2 % (ref 0–5)
HCT: 25 % — ABNORMAL LOW (ref 36.0–46.0)
Hemoglobin: 7.9 g/dL — ABNORMAL LOW (ref 12.0–15.0)
Lymphocytes Relative: 19 % (ref 12–46)
Lymphs Abs: 0.5 K/uL — ABNORMAL LOW (ref 0.7–4.0)
MCH: 29.3 pg (ref 26.0–34.0)
MCHC: 31.6 g/dL (ref 30.0–36.0)
MCV: 92.6 fL (ref 78.0–100.0)
Monocytes Absolute: 0.4 K/uL (ref 0.1–1.0)
Monocytes Relative: 15 % — ABNORMAL HIGH (ref 3–12)
Neutro Abs: 1.9 K/uL (ref 1.7–7.7)
Neutrophils Relative %: 65 % (ref 43–77)
Platelets: 187 K/uL (ref 150–400)
RBC: 2.7 MIL/uL — ABNORMAL LOW (ref 3.87–5.11)
RDW: 13 % (ref 11.5–15.5)
WBC: 2.9 K/uL — ABNORMAL LOW (ref 4.0–10.5)

## 2013-08-07 LAB — COMPREHENSIVE METABOLIC PANEL
ALT: 24 U/L (ref 0–35)
Albumin: 2.2 g/dL — ABNORMAL LOW (ref 3.5–5.2)
BUN: 28 mg/dL — ABNORMAL HIGH (ref 6–23)
Calcium: 8.5 mg/dL (ref 8.4–10.5)
Creatinine, Ser: 1.7 mg/dL — ABNORMAL HIGH (ref 0.50–1.10)
Total Protein: 6.9 g/dL (ref 6.0–8.3)

## 2013-08-07 LAB — PREPARE RBC (CROSSMATCH)

## 2013-08-07 MED ORDER — SODIUM CHLORIDE 0.9 % IJ SOLN
10.0000 mL | INTRAMUSCULAR | Status: AC | PRN
  Administered 2013-08-12: 10 mL

## 2013-08-07 MED ORDER — DEXAMETHASONE 4 MG PO TABS: 8.0000 mg | ORAL_TABLET | Freq: Two times a day (BID) | ORAL | Status: DC

## 2013-08-07 MED ORDER — FUROSEMIDE 10 MG/ML IJ SOLN
20.0000 mg | Freq: Once | INTRAMUSCULAR | Status: AC
  Administered 2013-08-07: 20 mg via INTRAVENOUS
  Filled 2013-08-07: qty 2

## 2013-08-07 MED ORDER — HEPARIN SOD (PORK) LOCK FLUSH 100 UNIT/ML IV SOLN: 500.0000 [IU] | Freq: Every day | INTRAVENOUS | Status: DC | PRN

## 2013-08-07 MED ORDER — ALTEPLASE 2 MG IJ SOLR
2.0000 mg | Freq: Once | INTRAMUSCULAR | Status: AC | PRN
  Filled 2013-08-07: qty 2

## 2013-08-07 MED ORDER — HEPARIN SOD (PORK) LOCK FLUSH 100 UNIT/ML IV SOLN: 250.0000 [IU] | INTRAVENOUS | Status: DC | PRN

## 2013-08-07 MED ORDER — HEPARIN SOD (PORK) LOCK FLUSH 100 UNIT/ML IV SOLN: 500.0000 [IU] | Freq: Once | INTRAVENOUS | Status: AC | PRN

## 2013-08-07 MED ORDER — SODIUM CHLORIDE 0.9 % IV SOLN
250.0000 mL | Freq: Once | INTRAVENOUS | Status: AC
Start: 2013-08-07 — End: 2013-08-07
  Administered 2013-08-07: 250 mL via INTRAVENOUS

## 2013-08-07 MED ORDER — SODIUM CHLORIDE 0.9 % IJ SOLN: 3.0000 mL | INTRAMUSCULAR | Status: DC | PRN

## 2013-08-07 MED ORDER — SODIUM CHLORIDE 0.9 % IJ SOLN: 10.0000 mL | INTRAMUSCULAR | Status: DC | PRN

## 2013-08-07 MED ORDER — PROCHLORPERAZINE MALEATE 10 MG PO TABS: 10.0000 mg | ORAL_TABLET | Freq: Four times a day (QID) | ORAL | Status: DC | PRN

## 2013-08-07 MED ORDER — HEPARIN SOD (PORK) LOCK FLUSH 100 UNIT/ML IV SOLN: 250.0000 [IU] | Freq: Once | INTRAVENOUS | Status: AC | PRN

## 2013-08-07 MED ORDER — LORAZEPAM 0.5 MG PO TABS: 0.5000 mg | ORAL_TABLET | Freq: Four times a day (QID) | ORAL | Status: DC | PRN

## 2013-08-07 MED ORDER — ONDANSETRON HCL 8 MG PO TABS: 8.0000 mg | ORAL_TABLET | Freq: Two times a day (BID) | ORAL | Status: DC

## 2013-08-07 MED ORDER — SODIUM CHLORIDE 0.9 % IV SOLN
Freq: Once | INTRAVENOUS | Status: AC
  Administered 2013-08-07: 8 mg via INTRAVENOUS
  Filled 2013-08-07: qty 4

## 2013-08-07 MED ORDER — SODIUM CHLORIDE 0.9 % IV SOLN
Freq: Once | INTRAVENOUS | Status: AC
  Administered 2013-08-07: 14:00:00 via INTRAVENOUS

## 2013-08-07 MED ORDER — PACLITAXEL PROTEIN-BOUND CHEMO INJECTION 100 MG
100.0000 mg/m2 | Freq: Once | INTRAVENOUS | Status: AC
  Administered 2013-08-07: 225 mg via INTRAVENOUS
  Filled 2013-08-07: qty 45

## 2013-08-07 NOTE — Progress Notes (Signed)
Patient tolerated chemo tx well.Portacath with good  blood return.Blood transfusion going on.

## 2013-08-07 NOTE — Progress Notes (Signed)
OT Cancellation Note  Patient Details Name: [name] MRN: [id number] DOB: 1946-06-21   Cancelled Treatment:    Reason Eval/Treat Not Completed: Pain limiting ability to participate. Pt does have BUE lymphedema.  Please advise if wrapping BUE ok at this time. Pt declined OOB at this time - pt to get blood today and is feeling fatigued.  [name] 08/07/2013, 11:57 AM

## 2013-08-07 NOTE — Progress Notes (Signed)
Jeanette Morris   DOB:1946/05/25   NF#:[id number]   QIO#:[id number]  Subjective: walked a bit with PT but felt veryu weak and when turning felt she could fall--see PT note, who recomment HHPT and 24 hr supervision. Pain better controlled on current meds. Good BM today. Family in room.  Objective: middle aged [place] woman examined in bed Filed Vitals:   08/07/13 0544  BP: 140/58  Pulse:   Temp:   Resp:     Body mass index is 39.64 kg/(m^2).  Intake/Output Summary (Last 24 hours) at 08/07/13 0834 Last data filed at 08/07/13 0600  Gross per 24 hour  Intake      0 ml  Output    700 ml  Net   -700 ml     Sclerae unicteric  Oropharynx no thrush, slightly dry  No cervical or supraclavicular adenopathy  Lungs clear -- auscultated anterolaterally--poor excursion bilaterally  Heart regular rate and rhythm  Abdomen obese, benign  MSK bilateral UE edema  Neuro nonfocal, well oriented, positive affect  Breast exam: deferred  CBG (last 3)  No results found for this basename: GLUCAP,  in the last 72 hours   Labs:  Lab Results  Component Value Date   WBC 2.9* 08/07/2013   HGB 7.9* 08/07/2013   HCT 25.0* 08/07/2013   MCV 92.6 08/07/2013   PLT 187 08/07/2013   NEUTROABS 1.9 08/07/2013    @LASTCHEMISTRY @  Urine Studies No results found for this basename: UACOL, UAPR, USPG, UPH, UTP, UGL, UKET, UBIL, UHGB, UNIT, UROB, ULEU, UEPI, UWBC, URBC, UBAC, CAST, CRYS, UCOM, BILUA,  in the last 72 hours  Basic Metabolic Panel:  Recent Labs Lab 08/03/13 2100 08/04/13 0500 08/05/13 0503 08/06/13 0415 08/07/13 0530  NA 128* 131* 130* 130* 128*  K 4.3 4.2 4.6 4.8 5.0  CL 95* 97 96 96 94*  CO2 28 26 29 28 29   GLUCOSE 112* 102* 100* 99 96  BUN 20 18 23  25* 28*  CREATININE 1.19* 1.14* 1.51* 1.56* 1.70*  CALCIUM 8.8 8.6 8.5 8.5 8.5   GFR Estimated Creatinine Clearance: 36.8 ml/min (by C-G formula based on Cr of 1.7). Liver Function Tests:  Recent Labs Lab 08/07/13 0530   AST 33  ALT 24  ALKPHOS 75  BILITOT 0.3  PROT 6.9  ALBUMIN 2.2*   No results found for this basename: LIPASE, AMYLASE,  in the last 168 hours No results found for this basename: AMMONIA,  in the last 168 hours Coagulation profile No results found for this basename: INR, PROTIME,  in the last 168 hours  CBC:  Recent Labs Lab 08/03/13 2100 08/04/13 0500 08/05/13 0503 08/06/13 0415 08/07/13 0530  WBC 3.5* 3.8* 2.7* 3.4* 2.9*  NEUTROABS 2.2  --   --   --  1.9  HGB 9.3* 8.9* 8.2* 8.3* 7.9*  HCT 27.8* 27.4* 25.1* 25.4* 25.0*  MCV 90.0 90.7 91.6 91.4 92.6  PLT 216 201 193 192 187   Cardiac Enzymes: No results found for this basename: CKTOTAL, CKMB, CKMBINDEX, TROPONINI,  in the last 168 hours BNP: No components found with this basename: POCBNP,  CBG: No results found for this basename: GLUCAP,  in the last 168 hours D-Dimer No results found for this basename: DDIMER,  in the last 72 hours Hgb A1c No results found for this basename: HGBA1C,  in the last 72 hours Lipid Profile No results found for this basename: CHOL, HDL, LDLCALC, TRIG, CHOLHDL, LDLDIRECT,  in the last 72 hours Thyroid function studies  No results found for this basename: TSH, T4TOTAL, FREET3, T3FREE, THYROIDAB,  in the last 72 hours Anemia work up No results found for this basename: VITAMINB12, FOLATE, FERRITIN, TIBC, IRON, RETICCTPCT,  in the last 72 hours Microbiology Recent Results (from the past 240 hour(s))  CULTURE, BLOOD (ROUTINE X 2)     Status: None   Collection Time    08/03/13 11:45 PM      Result Value Range Status   Specimen Description BLOOD PORTA CATH   Final   Special Requests BOTTLES DRAWN AEROBIC AND ANAEROBIC 5CC   Final   Culture  Setup Time     Final   Value: 08/04/2013 13:55     Performed at [facility]   Culture     Final   Value:        BLOOD CULTURE RECEIVED NO GROWTH TO DATE CULTURE WILL BE HELD FOR 5 DAYS BEFORE ISSUING A FINAL NEGATIVE REPORT     Performed at  [facility]   Report Status PENDING   Incomplete  CULTURE, BLOOD (ROUTINE X 2)     Status: None   Collection Time    08/04/13 12:01 AM      Result Value Range Status   Specimen Description BLOOD LEFT FOOT   Final   Special Requests BOTTLES DRAWN AEROBIC AND ANAEROBIC 5CC   Final   Culture  Setup Time     Final   Value: 08/04/2013 13:55     Performed at [facility]   Culture     Final   Value:        BLOOD CULTURE RECEIVED NO GROWTH TO DATE CULTURE WILL BE HELD FOR 5 DAYS BEFORE ISSUING A FINAL NEGATIVE REPORT     Performed at [facility]   Report Status PENDING   Incomplete      Studies:  Dg Chest 1 View  08/05/2013   CLINICAL DATA:  Status post left thoracentesis.  EXAM: CHEST - 1 VIEW  COMPARISON:  Chest radiographs 08/03/2013  FINDINGS: Left subclavian central venous catheter remains in place with tip overlying the mid SVC, unchanged. The cardiomediastinal silhouette is within normal limits. The patient has taken a slightly shallower inspiration than on the prior study. Left pleural effusion has decreased in size. Small right pleural effusion does not appear significantly changed. No definite pneumothorax is identified. Mild central pulmonary vascular congestion is unchanged. Surgical clips are again noted in both axillae.  IMPRESSION: Decreased size of left pleural effusion. No pneumothorax identified.   Electronically Signed   By: Sebastian Ache   On: 08/05/2013 11:12   US Thoracentesis Asp Pleural Space W/img Guide  08/05/2013   CLINICAL DATA:  Left pleural effusion  EXAM: ULTRASOUND GUIDED left THORACENTESIS  COMPARISON:  None  FINDINGS: A total of approximately 700 cc of yellow fluid was removed. A fluid sample was notsent for laboratory analysis.  IMPRESSION: Successful ultrasound guided left thoracentesis yielding 700 cc of pleural fluid.  Read by: Beckey Downing PA-C  PROCEDURE: An ultrasound guided thoracentesis was thoroughly discussed with the patient and  questions answered. The benefits, risks, alternatives and complications were also discussed. The patient understands and wishes to proceed with the procedure. Written consent was obtained.  Ultrasound was performed to localize and mark an adequate pocket of fluid in the left chest. The area was then prepped and draped in the normal sterile fashion. 1% Lidocaine was used for local anesthesia. Under ultrasound guidance a 19 gauge Yueh catheter  was introduced. Thoracentesis was performed. The catheter was removed and a dressing applied.  Complications:  None   Electronically Signed   By: Simonne Come M.D.   On: 08/05/2013 11:08    Assessment: 67 y.o. 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  (2) neoadjuvant chemotherapy consisted of 6 cycles of standard carboplatin, docetaxel, trastuzumab, with the trastuzumab held cycles 5 and 6 due to a drop in her borderline ejection fraction (from 45-50% at baseline to 40-45% January 2012)-- total trastuzumab treatment = 2 months  (3) status post right mastectomy and axillary lymph node dissection 12/01/2010, showing no residual invasive carcinoma in the breast, but residual tumor in 20 of 21 axillary lymph nodes. (Only one of the 20 positive lymph nodes showed a significant residual tumor, measuring 5 mm; the remaining lymph nodes, including the evidence of extracapsular extension, showed single and clusters a few neoplastic cells with extensive therapy related changes).  (4) postmastectomy radiation to the right chest wall, axilla, and supraclavicular region completed 03/07/2011  (5) additional 2 months of trastuzumab given February through April 2013, again discontinued because  of a drop in the ejection fraction and lateral S'  (6) on letrozole July 2012 to September 2013  (7) LEFT axillary adenopathy noted by Dr. Carolynne Edouard on exam July 2013, with biopsy of a left axillary node 04/17/2012 showing an invasive ductal carcinoma, estrogen receptor 91% positive, progesterone receptor 9% positive, with an MIB-1 of 45%, and no HER-2 amplification.  (8) PET scan 04/03/2012 showed multiple enlarged left axillary and small left supraclavicular lymph nodes that are moderately hypermetabolic, with some hypermetabolic activity within the left axillary tail. No other hypermetabolic lymph nodes identified. There was no involvement of the right axilla, mediastinum or right chest wall and no extrathoracic metastases were demonstrated.  (8) on exemestane and everolimus September through December 2013  (9) s/p RIGHT thoracentesis 08/01/2012, cytologically positive; s/p temporary Pleurx; effusion resolved after Abraxane therapy  (10) Abraxane started 08/16/2012, given day 1 and day 8 of each 21 day cycle, completed 6 cycles (12 doses) 12/06/2012, with excellent response  (11) fulvestrant started 12/19/2012, stopped 04/11/2013 with progression  (12) trastuzumab resumed 12/21/2011. We scheduled the doses 4 weeks apart to see if they caould be better tolerated. However echo 02/04/2013 again showed a drop in the EF and S', so Herceptin stopped (last dose 01/17/2013). Most recent echo, 04/18/2013, shows the ejection fraction to have recovered.  (13) status post left modified radical mastectomy 05/08/2013 for a pT4, pN2a [tumor extended over 15 cm, all 6 sampled lymph nodes were involved) invasive lobular breast cancer, grade 3, which was HER-2 negative. It was estrogen receptor positive at 72%, progesterone receptor negative. Posterior margin was broadly positive  (14) tamoxifen started 05/29/2013  (15) post-mastectomy irradiation to left chest wall and left  fossa, beginning 07/09/2013, interrupted  07/30/2013  (16) new baseline staging scans including a chest CT and PET scan were obtained 07/02/2013. These showed definite progression of disease,  when compared to previous scans in August 2013. 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 We are going to transfuse today. Will repeat CXR. Will resume chemotherapy with Abraxane, which she tolerated well last time (no PN) and was effective. I encouraged her to ambulate in halls with assistance and stay OOB as much as possible. Anticipate d/c to home DEC 12 AM   MAGRINAT,GUSTAV C, MD 08/07/2013  8:34 AM

## 2013-08-08 ENCOUNTER — Ambulatory Visit: Payer: PRIVATE HEALTH INSURANCE

## 2013-08-08 DIAGNOSIS — C50619 Malignant neoplasm of axillary tail of unspecified female breast: Secondary | ICD-10-CM

## 2013-08-08 DIAGNOSIS — N189 Chronic kidney disease, unspecified: Secondary | ICD-10-CM

## 2013-08-08 LAB — TYPE AND SCREEN
ABO/RH(D): A POS
Antibody Screen: NEGATIVE
Unit division: 0

## 2013-08-08 MED ORDER — OXYCODONE HCL 5 MG PO TABS
10.0000 mg | ORAL_TABLET | ORAL | Status: AC
  Administered 2013-08-08: 10 mg via ORAL

## 2013-08-08 NOTE — Progress Notes (Signed)
Physical Therapy Treatment Patient Details Name: Jeanette Morris MRN: [id number] DOB: Dec 06, 1945 Today's Date: 08/08/2013 Time: 4098-1191 PT Time Calculation (min): 29 min  PT Assessment / Plan / Recommendation  History of Present Illness 67 yo female admitted with pleural effuison. s/p thoracentesis 12/8. Hx of met breast cancer, bil mastectomies, bil UE lymphedema.    PT Comments   Progressing with mobility. Still has dyspnea with exertion. Did not remove O2 for ambulation. Pt states plan is for her to d/c on tomorrow.   Follow Up Recommendations  Home health PT;Supervision/Assistance - 24 hour     Does the patient have the potential to tolerate intense rehabilitation     Barriers to Discharge        Equipment Recommendations  Rolling walker with 5" wheels    Recommendations for Other Services OT consult  Frequency Min 3X/week   Progress towards PT Goals Progress towards PT goals: Progressing toward goals  Plan Current plan remains appropriate    Precautions / Restrictions Precautions Precautions: Fall Precaution Comments: BUE lymphedema Restrictions Weight Bearing Restrictions: No   Pertinent Vitals/Pain No c/o pain    Mobility  Bed Mobility Bed Mobility: Sit to Supine Sit to Supine: 4: Min assist Details for Bed Mobility Assistance: Assist for LEs.  Transfers Transfers: Sit to Stand;Stand to Sit Sit to Stand: 4: Min guard;From chair/3-in-1 Stand to Sit: 4: Min guard;To bed Details for Transfer Assistance: VCs safety, hand placement Ambulation/Gait Ambulation/Gait Assistance: 4: Min guard Ambulation Distance (Feet): 100 Feet Assistive device: Rolling walker Ambulation/Gait Assistance Details: slow gait speed. remained on 2L O2 for ambulation. fatigues fairly easily.  Gait Pattern: Step-through pattern    Exercises     PT Diagnosis:    PT Problem List:   PT Treatment Interventions:     PT Goals (current goals can now be found in the care plan section)     Visit Information  Last PT Received On: 08/08/13 Assistance Needed: +1 History of Present Illness: 67 yo female admitted with pleural effuison. s/p thoracentesis 12/8. Hx of met breast cancer, bil mastectomies, bil UE lymphedema.     Subjective Data      Cognition  Cognition Arousal/Alertness: Awake/alert Behavior During Therapy: WFL for tasks assessed/performed Overall Cognitive Status: Within Functional Limits for tasks assessed    Balance     End of Session PT - End of Session Equipment Utilized During Treatment: Gait belt;Oxygen Activity Tolerance: Patient limited by fatigue Patient left: in bed;with call bell/phone within reach   GP     Rebeca Alert, MPT Pager: [phone]

## 2013-08-08 NOTE — Progress Notes (Signed)
Jeanette Morris   DOB:1945/10/28   JX#:[id number]   OZH#:[id number]  Subjective: Jeanette Morris tolerated her blood transfusion and chemotherapy yesterday. She has not been getting OOB much but she did sit up "some."  Family in room.  Objective: middle aged [place] woman examined in bed Filed Vitals:   08/08/13 1305  BP: 158/70  Pulse: 73  Temp: 98 F (36.7 C)  Resp: 16    Body mass index is 39.64 kg/(m^2).  Intake/Output Summary (Last 24 hours) at 08/08/13 1643 Last data filed at 08/08/13 6295  Gross per 24 hour  Intake 1147.5 ml  Output   2150 ml  Net -1002.5 ml     Sclerae unicteric  Oropharynx no thrush, moist  No cervical or supraclavicular adenopathy  Lungs clear -- auscultated anterolaterally--poor excursion bilaterally  Heart regular rate and rhythm  Abdomen obese, benign  MSK bilateral UE edema  Neuro nonfocal, well oriented, positive affect  Breast exam: deferred  CBG (last 3)  No results found for this basename: GLUCAP,  in the last 72 hours   Labs:  Lab Results  Component Value Date   WBC 2.9* 08/07/2013   HGB 7.9* 08/07/2013   HCT 25.0* 08/07/2013   MCV 92.6 08/07/2013   PLT 187 08/07/2013   NEUTROABS 1.9 08/07/2013    @LASTCHEMISTRY @  Urine Studies No results found for this basename: UACOL, UAPR, USPG, UPH, UTP, UGL, UKET, UBIL, UHGB, UNIT, UROB, ULEU, UEPI, UWBC, URBC, UBAC, CAST, CRYS, UCOM, BILUA,  in the last 72 hours  Basic Metabolic Panel:  Recent Labs Lab 08/03/13 2100 08/04/13 0500 08/05/13 0503 08/06/13 0415 08/07/13 0530  NA 128* 131* 130* 130* 128*  K 4.3 4.2 4.6 4.8 5.0  CL 95* 97 96 96 94*  CO2 28 26 29 28 29   GLUCOSE 112* 102* 100* 99 96  BUN 20 18 23  25* 28*  CREATININE 1.19* 1.14* 1.51* 1.56* 1.70*  CALCIUM 8.8 8.6 8.5 8.5 8.5   GFR Estimated Creatinine Clearance: 36.8 ml/min (by C-G formula based on Cr of 1.7). Liver Function Tests:  Recent Labs Lab 08/07/13 0530  AST 33  ALT 24  ALKPHOS 75  BILITOT 0.3   PROT 6.9  ALBUMIN 2.2*   No results found for this basename: LIPASE, AMYLASE,  in the last 168 hours No results found for this basename: AMMONIA,  in the last 168 hours Coagulation profile No results found for this basename: INR, PROTIME,  in the last 168 hours  CBC:  Recent Labs Lab 08/03/13 2100 08/04/13 0500 08/05/13 0503 08/06/13 0415 08/07/13 0530  WBC 3.5* 3.8* 2.7* 3.4* 2.9*  NEUTROABS 2.2  --   --   --  1.9  HGB 9.3* 8.9* 8.2* 8.3* 7.9*  HCT 27.8* 27.4* 25.1* 25.4* 25.0*  MCV 90.0 90.7 91.6 91.4 92.6  PLT 216 201 193 192 187   Cardiac Enzymes: No results found for this basename: CKTOTAL, CKMB, CKMBINDEX, TROPONINI,  in the last 168 hours BNP: No components found with this basename: POCBNP,  CBG: No results found for this basename: GLUCAP,  in the last 168 hours D-Dimer No results found for this basename: DDIMER,  in the last 72 hours Hgb A1c No results found for this basename: HGBA1C,  in the last 72 hours Lipid Profile No results found for this basename: CHOL, HDL, LDLCALC, TRIG, CHOLHDL, LDLDIRECT,  in the last 72 hours Thyroid function studies No results found for this basename: TSH, T4TOTAL, FREET3, T3FREE, THYROIDAB,  in the last 72 hours Anemia  work up No results found for this basename: VITAMINB12, FOLATE, FERRITIN, TIBC, IRON, RETICCTPCT,  in the last 72 hours Microbiology Recent Results (from the past 240 hour(s))  CULTURE, BLOOD (ROUTINE X 2)     Status: None   Collection Time    08/03/13 11:45 PM      Result Value Range Status   Specimen Description BLOOD PORTA CATH   Final   Special Requests BOTTLES DRAWN AEROBIC AND ANAEROBIC 5CC   Final   Culture  Setup Time     Final   Value: 08/04/2013 13:55     Performed at [facility]   Culture     Final   Value:        BLOOD CULTURE RECEIVED NO GROWTH TO DATE CULTURE WILL BE HELD FOR 5 DAYS BEFORE ISSUING A FINAL NEGATIVE REPORT     Performed at [facility]   Report Status PENDING    Incomplete  CULTURE, BLOOD (ROUTINE X 2)     Status: None   Collection Time    08/04/13 12:01 AM      Result Value Range Status   Specimen Description BLOOD LEFT FOOT   Final   Special Requests BOTTLES DRAWN AEROBIC AND ANAEROBIC 5CC   Final   Culture  Setup Time     Final   Value: 08/04/2013 13:55     Performed at [facility]   Culture     Final   Value:        BLOOD CULTURE RECEIVED NO GROWTH TO DATE CULTURE WILL BE HELD FOR 5 DAYS BEFORE ISSUING A FINAL NEGATIVE REPORT     Performed at [facility]   Report Status PENDING   Incomplete      Studies:  Dg Chest 2 View  08/07/2013   CLINICAL DATA:  History of pleural effusion.  EXAM: CHEST  2 VIEW  COMPARISON:  Chest x-ray of August 05, 2013.  FINDINGS: The lungs are reasonably well inflated. The left hemidiaphragm remains obscured. There is blunting of the posterior costophrenic angles bilaterally with a moderate-sized effusion likely present on the left. The cardiopericardial silhouette is enlarged. The central pulmonary vascularity is engorged. The pulmonary interstitial markings are slightly more conspicuous today. There are surgical clips in the axillary regions. There is a left subclavian venous catheter in place whose tip lies in the region of the junction of the right and left brachiocephalic veins.  IMPRESSION: 1. There remains a small to moderate size left pleural effusion and small right pleural effusion. These findings do not appear does have significantly changed since the previous study. 2. The the cardiopericardial silhouette remains enlarged but the pulmonary vascularity is more engorged today in the pulmonary interstitial markings are more conspicuous.   Electronically Signed   By: David  [place]   On: 08/07/2013 10:29    Assessment: 67 y.o. [place] woman with stage 4 breast cancer  (1) status post RIGHT axillary lymph node biopsy 05/27/2010 for a clinical T4 N1, stage IIIB (inflammatory) invasive ductal  carcinoma, grade not stated, estrogen receptor 94% positive, progesterone receptor 64% positive, with an MIB-1 of 60%, and HER-2 amplification by FISH with a ratio of 7.5.  (2) neoadjuvant chemotherapy consisted of 6 cycles of standard carboplatin, docetaxel, trastuzumab, with the trastuzumab held cycles 5 and 6 due to a drop in her borderline ejection fraction (from 45-50% at baseline to 40-45% January 2012)-- total trastuzumab treatment = 2 months  (3) status post right mastectomy and axillary  lymph node dissection 12/01/2010, showing no residual invasive carcinoma in the breast, but residual tumor in 20 of 21 axillary lymph nodes. (Only one of the 20 positive lymph nodes showed a significant residual tumor, measuring 5 mm; the remaining lymph nodes, including the evidence of extracapsular extension, showed single and clusters a few neoplastic cells with extensive therapy related changes).  (4) postmastectomy radiation to the right chest wall, axilla, and supraclavicular region completed 03/07/2011  (5) additional 2 months of trastuzumab given February through April 2013, again discontinued because of a drop in the ejection fraction and lateral S'  (6) on letrozole July 2012 to September 2013  (7) LEFT axillary adenopathy noted by Dr. Carolynne Edouard on exam July 2013, with biopsy of a left axillary node 04/17/2012 showing an invasive ductal carcinoma, estrogen receptor 91% positive, progesterone receptor 9% positive, with an MIB-1 of 45%, and no HER-2 amplification.  (8) PET scan 04/03/2012 showed multiple enlarged left axillary and small left supraclavicular lymph nodes that are moderately hypermetabolic, with some hypermetabolic activity within the left axillary tail. No other hypermetabolic lymph nodes identified. There was no involvement of the right axilla, mediastinum or right chest wall and no extrathoracic metastases were demonstrated.  (8) on exemestane and everolimus September through December 2013  (9)  s/p RIGHT thoracentesis 08/01/2012, cytologically positive; s/p temporary Pleurx; effusion resolved after Abraxane therapy  (10) Abraxane started 08/16/2012, given day 1 and day 8 of each 21 day cycle, completed 6 cycles (12 doses) 12/06/2012, with excellent response  (11) fulvestrant started 12/19/2012, stopped 04/11/2013 with progression  (12) trastuzumab resumed 12/21/2011. We scheduled the doses 4 weeks apart to see if they caould be better tolerated. However echo 02/04/2013 again showed a drop in the EF and S', so Herceptin stopped (last dose 01/17/2013). Most recent echo, 04/18/2013, shows the ejection fraction to have recovered.  (13) status post left modified radical mastectomy 05/08/2013 for a pT4, pN2a [tumor extended over 15 cm, all 6 sampled lymph nodes were involved) invasive lobular breast cancer, grade 3, which was HER-2 negative. It was estrogen receptor positive at 72%, progesterone receptor negative. Posterior margin was broadly positive  (14) tamoxifen started 05/29/2013  (15) post-mastectomy irradiation to left chest wall and left [place] fossa, beginning 07/09/2013, interrupted 07/30/2013  (16) new baseline staging scans including a chest CT and PET scan were obtained 07/02/2013. These showed definite progression of disease,  when compared to previous scans in August 2013. 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 I suggested she might benefit from a brief stay in a skilled nursing facility where she could receive rehabilitation. She really does not like the idea. I asked her to think of herself today as being at home. She can eat, drink, and walk around some at least, then  she might be able to go home tomorrow. Otherwise we will have to return to the idea of a SNF.  Lowella Dell, MD 08/08/2013  4:43 PM

## 2013-08-08 NOTE — Progress Notes (Signed)
Pt would like to go to OP for lymphedema treatment. PLease order if you agree. Thanks, Lise Auer

## 2013-08-08 NOTE — Evaluation (Signed)
Occupational Therapy Evaluation Patient Details Name: Jeanette Morris MRN: [id number] DOB: 02/04/1946 Today's Date: 08/08/2013 Time: 7846-9629 OT Time Calculation (min): 24 min  OT Assessment / Plan / Recommendation History of present illness 67 yo female admitted with pleural effuison. s/p thoracentesis 12/8. Hx of met breast cancer, bil mastectomies, bil UE lymphedema.    Clinical Impression   Pt presents to OT with decreased I with ADL activity. Pts edema if BUE very limiting to pts ADL activity.  Pt is familiar with process and has been in the past.  Pt will benefit from skilled OT to address ADL activity in order for pt to successfully return home with husband    OT Assessment  Patient needs continued OT Services    Follow Up Recommendations  Home health OT;Supervision/Assistance - 24 hour;Outpatient OT;Other (comment) (needs OP lymphdema treatment- but may need HHOT f)       Equipment Recommendations  None recommended by OT       Frequency  Min 2X/week    Precautions / Restrictions Precautions Precautions: Fall Precaution Comments: BUE lymphedema       ADL  Grooming: Moderate assistance Where Assessed - Grooming: Unsupported sitting Upper Body Bathing: Moderate assistance Where Assessed - Upper Body Bathing: Unsupported sitting Lower Body Bathing: Maximal assistance Where Assessed - Lower Body Bathing: Supported sit to stand Upper Body Dressing: Moderate assistance Where Assessed - Upper Body Dressing: Unsupported sitting Lower Body Dressing: Maximal assistance Where Assessed - Lower Body Dressing: Supported sit to stand Toilet Transfer: Moderate assistance Toilet Transfer Method: Sit to Barista: Bedside commode Toileting - Clothing Manipulation and Hygiene: Maximal assistance Where Assessed - Toileting Clothing Manipulation and Hygiene: Standing ADL Comments: Pts lymphedema very limiting to ADL activity as it is limiting pts use of arms.  Pts has gone to OP clinic in the past and does have supplies at home.  Pt wants to go back to OP    OT Diagnosis: Generalized weakness;Other (comment) (lymphedema)  OT Problem List: Decreased strength;Decreased activity tolerance;Decreased range of motion;Impaired UE functional use   OT Goals(Current goals can be found in the care plan section) Acute Rehab OT Goals Patient Stated Goal: home soon OT Goal Formulation: With patient Time For Goal Achievement: 08/22/13  Visit Information  Last OT Received On: 08/08/13 Assistance Needed: +1 History of Present Illness: 67 yo female admitted with pleural effuison. s/p thoracentesis 12/8. Hx of met breast cancer, bil mastectomies, bil UE lymphedema.        Prior Functioning     Home Living Family/patient expects to be discharged to:: Private residence Living Arrangements: Spouse/significant other Available Help at Discharge: Family Type of Home: House Home Access: Stairs to enter Secretary/administrator of Steps: 2 Entrance Stairs-Rails: None Home Layout: One level Home Equipment: None Prior Function Level of Independence: Needs assistance Communication Communication: No difficulties         Vision/Perception Vision - History Baseline Vision: Wears glasses all the time   Cognition  Cognition Arousal/Alertness: Awake/alert Behavior During Therapy: WFL for tasks assessed/performed Overall Cognitive Status: Within Functional Limits for tasks assessed    Extremity/Trunk Assessment Upper Extremity Assessment Upper Extremity Assessment: RUE deficits/detail RUE Deficits / Details: lymphedema- limited AROM LUE Deficits / Details: lymphedema- limited AROM     Mobility Bed Mobility Bed Mobility: Supine to Sit Supine to Sit: HOB elevated;With rails;4: Min assist Details for Bed Mobility Assistance: Increased time. Assist for trunk to full upright Transfers Sit to Stand: 4: Min assist;From bed Stand  to Sit: 4: Min assist;To  chair/3-in-1 Details for Transfer Assistance: assist to rise, stabilize, control descent. vcs safety,hand placement           End of Session OT - End of Session Activity Tolerance: Patient tolerated treatment well Patient left: in chair Nurse Communication: Mobility status  GO     [name] 08/08/2013, 11:52 AM

## 2013-08-08 NOTE — Care Management Note (Signed)
Cm spoke with patient at the bedside concerning discharge planning. PT OT recommendations for [facility]. Per pt resides home with spouse and adult daughter. Pt offered choice for [facility]. Per pt choice [facility] to provide [facility] services at discharge. Pt request 3N1 and RW. [facility] rep Lanae Crumbly made aware.     Jeanette Morris Isauro Skelley,MSN,RN [phone]

## 2013-08-08 NOTE — Progress Notes (Signed)
CSW received referral to assist with [facility] needs.   Inappropriate CSW referral.  CSW reviewed chart and noted that [facility] met with pt today regarding HH needs. Please refer to [facility] note for further details.   No social work needs identified.   CSW signing off.   Please re-consult if social work needs arise.   Jacklynn Lewis, MSW, LCSWA  Clinical Social Work [phone]

## 2013-08-09 ENCOUNTER — Inpatient Hospital Stay (HOSPITAL_COMMUNITY): Payer: PRIVATE HEALTH INSURANCE

## 2013-08-09 ENCOUNTER — Ambulatory Visit: Payer: PRIVATE HEALTH INSURANCE

## 2013-08-09 DIAGNOSIS — R5381 Other malaise: Secondary | ICD-10-CM

## 2013-08-09 LAB — COMPREHENSIVE METABOLIC PANEL
ALT: 19 U/L (ref 0–35)
AST: 42 U/L — ABNORMAL HIGH (ref 0–37)
Albumin: 2.4 g/dL — ABNORMAL LOW (ref 3.5–5.2)
CO2: 32 mEq/L (ref 19–32)
Calcium: 8.7 mg/dL (ref 8.4–10.5)
Chloride: 96 mEq/L (ref 96–112)
Creatinine, Ser: 1.33 mg/dL — ABNORMAL HIGH (ref 0.50–1.10)
GFR calc non Af Amer: 40 mL/min — ABNORMAL LOW (ref >90)
Sodium: 132 mEq/L — ABNORMAL LOW (ref 135–145)

## 2013-08-09 LAB — CBC
MCH: 29.1 pg (ref 26.0–34.0)
MCHC: 32.2 g/dL (ref 30.0–36.0)
MCV: 90.3 fL (ref 78.0–100.0)
Platelets: 176 10*3/uL (ref 150–400)
RBC: 3.51 MIL/uL — ABNORMAL LOW (ref 3.87–5.11)
RDW: 13.3 % (ref 11.5–15.5)
WBC: 3.5 10*3/uL — ABNORMAL LOW (ref 4.0–10.5)

## 2013-08-09 MED ORDER — PROCHLORPERAZINE EDISYLATE 5 MG/ML IJ SOLN
10.0000 mg | Freq: Four times a day (QID) | INTRAMUSCULAR | Status: DC | PRN
  Administered 2013-08-09: 10 mg via INTRAVENOUS
  Filled 2013-08-09: qty 2

## 2013-08-09 NOTE — Progress Notes (Signed)
Ryllie Nieland   DOB:09/26/1945   RU#:[id number]   BJY#:[id number]  Subjective: Aryani gave it a good try yesterday, was OOB to chair for 2-3 hours and walks about 20 steps with the physical therapist per her account. howver she heard of the death of her nephew and that has "cast her down." This morning she is nauseated and very SOB, wearing 02; doesn't feel strong enough to get out of bed. She and her husband do not feel she is strong enough to go home. We discussed SNF. Husband in room  Objective: middle aged [place] American woman examined in bed Filed Vitals:   08/09/13 0455  BP: 145/62  Pulse: 78  Temp: 98 F (36.7 C)  Resp: 16    Body mass index is 39.64 kg/(m^2).  Intake/Output Summary (Last 24 hours) at 08/09/13 1032 Last data filed at 08/09/13 0455  Gross per 24 hour  Intake    930 ml  Output    450 ml  Net    480 ml     Sclerae unicteric  Oropharynx no thrush, moist  No cervical or supraclavicular adenopathy  Lungs auscultated anterolaterally--poor excursion bilaterally  Heart regular rate and rhythm  Abdomen obese, +BS  MSK bilateral UE edema  Neuro nonfocal, well oriented, positive affect  Breast exam: deferred  CBG (last 3)  No results found for this basename: GLUCAP,  in the last 72 hours   Labs:  Lab Results  Component Value Date   WBC 3.5* 08/09/2013   HGB 10.2* 08/09/2013   HCT 31.7* 08/09/2013   MCV 90.3 08/09/2013   PLT 176 08/09/2013   NEUTROABS 1.9 08/07/2013    @LASTCHEMISTRY @  Urine Studies No results found for this basename: UACOL, UAPR, USPG, UPH, UTP, UGL, UKET, UBIL, UHGB, UNIT, UROB, ULEU, UEPI, UWBC, URBC, UBAC, CAST, CRYS, UCOM, BILUA,  in the last 72 hours  Basic Metabolic Panel:  Recent Labs Lab 08/04/13 0500 08/05/13 0503 08/06/13 0415 08/07/13 0530 08/09/13 0550  NA 131* 130* 130* 128* 132*  K 4.2 4.6 4.8 5.0 5.1  CL 97 96 96 94* 96  CO2 26 29 28 29  32  GLUCOSE 102* 100* 99 96 100*  BUN 18 23 25* 28* 31*  CREATININE  1.14* 1.51* 1.56* 1.70* 1.33*  CALCIUM 8.6 8.5 8.5 8.5 8.7   GFR Estimated Creatinine Clearance: 47.1 ml/min (by C-G formula based on Cr of 1.33). Liver Function Tests:  Recent Labs Lab 08/07/13 0530 08/09/13 0550  AST 33 42*  ALT 24 19  ALKPHOS 75 64  BILITOT 0.3 0.5  PROT 6.9 7.1  ALBUMIN 2.2* 2.4*   No results found for this basename: LIPASE, AMYLASE,  in the last 168 hours No results found for this basename: AMMONIA,  in the last 168 hours Coagulation profile No results found for this basename: INR, PROTIME,  in the last 168 hours  CBC:  Recent Labs Lab 08/03/13 2100 08/04/13 0500 08/05/13 0503 08/06/13 0415 08/07/13 0530 08/09/13 0550  WBC 3.5* 3.8* 2.7* 3.4* 2.9* 3.5*  NEUTROABS 2.2  --   --   --  1.9  --   HGB 9.3* 8.9* 8.2* 8.3* 7.9* 10.2*  HCT 27.8* 27.4* 25.1* 25.4* 25.0* 31.7*  MCV 90.0 90.7 91.6 91.4 92.6 90.3  PLT 216 201 193 192 187 176   Cardiac Enzymes: No results found for this basename: CKTOTAL, CKMB, CKMBINDEX, TROPONINI,  in the last 168 hours BNP: No components found with this basename: POCBNP,  CBG: No results found for this  basename: GLUCAP,  in the last 168 hours D-Dimer No results found for this basename: DDIMER,  in the last 72 hours Hgb A1c No results found for this basename: HGBA1C,  in the last 72 hours Lipid Profile No results found for this basename: CHOL, HDL, LDLCALC, TRIG, CHOLHDL, LDLDIRECT,  in the last 72 hours Thyroid function studies No results found for this basename: TSH, T4TOTAL, FREET3, T3FREE, THYROIDAB,  in the last 72 hours Anemia work up No results found for this basename: VITAMINB12, FOLATE, FERRITIN, TIBC, IRON, RETICCTPCT,  in the last 72 hours Microbiology Recent Results (from the past 240 hour(s))  CULTURE, BLOOD (ROUTINE X 2)     Status: None   Collection Time    08/03/13 11:45 PM      Result Value Range Status   Specimen Description BLOOD PORTA CATH   Final   Special Requests BOTTLES DRAWN AEROBIC AND  ANAEROBIC 5CC   Final   Culture  Setup Time     Final   Value: 08/04/2013 13:55     Performed at [facility]   Culture     Final   Value:        BLOOD CULTURE RECEIVED NO GROWTH TO DATE CULTURE WILL BE HELD FOR 5 DAYS BEFORE ISSUING A FINAL NEGATIVE REPORT     Performed at [facility]   Report Status PENDING   Incomplete  CULTURE, BLOOD (ROUTINE X 2)     Status: None   Collection Time    08/04/13 12:01 AM      Result Value Range Status   Specimen Description BLOOD LEFT FOOT   Final   Special Requests BOTTLES DRAWN AEROBIC AND ANAEROBIC 5CC   Final   Culture  Setup Time     Final   Value: 08/04/2013 13:55     Performed at [facility]   Culture     Final   Value:        BLOOD CULTURE RECEIVED NO GROWTH TO DATE CULTURE WILL BE HELD FOR 5 DAYS BEFORE ISSUING A FINAL NEGATIVE REPORT     Performed at [facility]   Report Status PENDING   Incomplete      Studies:  No results found.  Assessment: 67 y.o. [place] woman with stage 4 breast cancer  (1) status post RIGHT axillary lymph node biopsy 05/27/2010 for a clinical T4 N1, stage IIIB (inflammatory) invasive ductal carcinoma, grade not stated, estrogen receptor 94% positive, progesterone receptor 64% positive, with an MIB-1 of 60%, and HER-2 amplification by FISH with a ratio of 7.5.  (2) neoadjuvant chemotherapy consisted of 6 cycles of standard carboplatin, docetaxel, trastuzumab, with the trastuzumab held cycles 5 and 6 due to a drop in her borderline ejection fraction (from 45-50% at baseline to 40-45% January 2012)-- total trastuzumab treatment = 2 months  (3) status post right mastectomy and axillary lymph node dissection 12/01/2010, showing no residual invasive carcinoma in the breast, but residual tumor in 20 of 21 axillary lymph nodes. (Only one of the 20 positive lymph nodes showed a significant residual tumor, measuring 5 mm; the remaining lymph nodes, including the evidence of extracapsular  extension, showed single and clusters a few neoplastic cells with extensive therapy related changes).  (4) postmastectomy radiation to the right chest wall, axilla, and supraclavicular region completed 03/07/2011  (5) additional 2 months of trastuzumab given February through April 2013, again discontinued because of a drop in the ejection fraction and lateral S'  (6) on  letrozole July 2012 to September 2013  (7) LEFT axillary adenopathy noted by Dr. Carolynne Edouard on exam July 2013, with biopsy of a left axillary node 04/17/2012 showing an invasive ductal carcinoma, estrogen receptor 91% positive, progesterone receptor 9% positive, with an MIB-1 of 45%, and no HER-2 amplification.  (8) PET scan 04/03/2012 showed multiple enlarged left axillary and small left supraclavicular lymph nodes that are moderately hypermetabolic, with some hypermetabolic activity within the left axillary tail. No other hypermetabolic lymph nodes identified. There was no involvement of the right axilla, mediastinum or right chest wall and no extrathoracic metastases were demonstrated.  (8) on exemestane and everolimus September through December 2013  (9) s/p RIGHT thoracentesis 08/01/2012, cytologically positive; s/p temporary Pleurx; effusion resolved after Abraxane therapy  (10) Abraxane started 08/16/2012, given day 1 and day 8 of each 21 day cycle, completed 6 cycles (12 doses) 12/06/2012, with excellent response  (11) fulvestrant started 12/19/2012, stopped 04/11/2013 with progression  (12) trastuzumab resumed 12/21/2011. We scheduled the doses 4 weeks apart to see if they caould be better tolerated. However echo 02/04/2013 again showed a drop in the EF and S', so Herceptin stopped (last dose 01/17/2013). Most recent echo, 04/18/2013, shows the ejection fraction to have recovered.  (13) status post left modified radical mastectomy 05/08/2013 for a pT4, pN2a [tumor extended over 15 cm, all 6 sampled lymph nodes were involved) invasive  lobular breast cancer, grade 3, which was HER-2 negative. It was estrogen receptor positive at 72%, progesterone receptor negative. Posterior margin was broadly positive  (14) tamoxifen started 05/29/2013  (15) post-mastectomy irradiation to left chest wall and left Elrosa fossa, beginning 07/09/2013, interrupted 07/30/2013  (16) new baseline staging scans including a chest CT and PET scan were obtained 07/02/2013. These showed definite progression of disease,  when compared to previous scans in August 2013. 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 I think she will need 2-3 weeks in a SNF for Rehab. The problem is she will need a second dose of chemo next weds DEC 17. I think it would be reasonable to aim for discharge to a SNF after that, most likely DEC 19. In the meantime we can continue PT here. I will reassess her effusions Monday and if there has been rapid reaccumulation we will ask IR to place a pleurx catheter to facilitate daily drainage. She will need wrapping of her R arm and a sleeve for her left. Patient is agreeable to this plan.  Lowella Dell, MD 08/09/2013  10:32 AM

## 2013-08-09 NOTE — Progress Notes (Signed)
OT Note:  Spoke to pt:  She will have husband bring lymphedema supplies and we will begin wrapping on Monday.  Pt has only had R lymphedema in the past, and now she is Bil, with right greater than left.  Will likely start with R to get that managed first.   When searching for SNF facilities, it would be helpful if they have a therapist trained in lymphedema management, if possible.  Marica Otter, OTR/L [phone] 08/09/2013

## 2013-08-09 NOTE — Care Management Note (Signed)
Cm received a phone call from pt's insurance payer [facility] concerning possible SNF placement. Per  [facility] rep insurance payer authorized approval for SNF placement.CSW made aware. Will continue to follow.   Roxy Manns Burnadette Baskett,MSN,RN [phone]

## 2013-08-09 NOTE — Progress Notes (Addendum)
Clinical Social Work Department CLINICAL SOCIAL WORK PLACEMENT NOTE 08/09/2013  Patient:  Jeanette Morris, Jeanette Morris  Account Number:  [id number] Admit date:  08/03/2013  Clinical Social Worker:  Jacelyn Grip  Date/time:  08/09/2013 04:30 PM  Clinical Social Work is seeking post-discharge placement for this patient at the following level of care:   SKILLED NURSING   (*CSW will update this form in Epic as items are completed)   08/09/2013  Patient/family provided with [facility] Department of Clinical Social Work's list of facilities offering this level of care within the geographic area requested by the patient (or if unable, by the patient's family).  08/09/2013  Patient/family informed of their freedom to choose among providers that offer the needed level of care, that participate in Medicare, Medicaid or managed care program needed by the patient, have an available bed and are willing to accept the patient.  08/09/2013  Patient/family informed of [facility]' ownership interest in [facility], as well as of the fact that they are under no obligation to receive care at this facility.  PASARR submitted to EDS on 08/09/2013 PASARR number received from [employer] on 08/09/2013  FL2 transmitted to all facilities in geographic area requested by pt/family on  08/09/2013 FL2 transmitted to all facilities within larger geographic area on   Patient informed that his/her managed care company has contracts with or will negotiate with  certain facilities, including the following:     Patient/family informed of bed offers received:  08/10/2013 Patient chooses bed at [facility] Physician recommends and patient chooses bed at    Patient to be transferred to  on  [facility] on 08/12/2013 Patient to be transferred to facility by ambulance Sharin Mons)  The following physician request were entered in Epic:   Additional Comments:   Jacklynn Lewis, MSW, LCSW Clinical  Social Work [phone]

## 2013-08-09 NOTE — Progress Notes (Signed)
Clinical Social Work Department BRIEF PSYCHOSOCIAL ASSESSMENT 08/09/2013  Patient:  Jeanette Morris, Jeanette Morris     Account Number:  [id number]     Admit date:  08/03/2013  Clinical Social Worker:  Jacelyn Grip  Date/Time:  08/09/2013 04:30 PM  Referred by:  Physician  Date Referred:  08/09/2013 Referred for  SNF Placement   Other Referral:   Interview type:  Patient Other interview type:   and patient husband    PSYCHOSOCIAL DATA Living Status:  HUSBAND Admitted from facility:   Level of care:   Primary support name:  Jeanette Morris Primary support relationship to patient:  SPOUSE Degree of support available:   strong    CURRENT CONCERNS Current Concerns  Post-Acute Placement   Other Concerns:    SOCIAL WORK ASSESSMENT / PLAN CSW received referral for New SNF placement.    CSW reviewed chart and spoke with RNCM who discussed that RNCM had spoken with pt insurance representative and pt insurance company will approve pt for SNF stay.    CSW met with pt at bedside and contacted pt husband via telephone and placed phone on speaker in order to have conversation together. CSW introduced self and explained role. Pt and pt spouse were anticipating CSW visit and aware of MD recommendation for short term rehab. CSW discussed process of SNF placement and that CSW would be able to get further information re: insurance coverage and possible copayments once SNF search was initiated. CSW discussed that even though MD at this time is discussing that pt may stay in hospital until chemotherapy on 12/19 and CSW discussed that pt insurance company may feel that pt could transition to a rehab facility and then return to outpt cancer center for chemotherapy. Pt and pt spouse expressed understanding in regard to this. CSW clarified pt and pt spouse questions and concerns. Pt agreeable to [facility] search.    CSW completed FL2 and initiated SNF search to [facility].    CSW to follow up  with pt and pt spouse re: bed offers.    CSW to continue to follow and facilitate pt discharge needs when pt medically ready for discharge.   Assessment/plan status:  Psychosocial Support/Ongoing Assessment of Needs Other assessment/ plan:   discharge planning   Information/referral to community resources:   [facility] list    PATIENT'S/FAMILY'S RESPONSE TO PLAN OF CARE: Pt alert and oriented x 4. Pt appears to be coping well with the discussion of rehab before returning home. Pt and pt spouse appear prepared to make a decision re: placement and appreciative of CSW support.    Jeanette Morris, MSW, LCSWA  Clinical Social Work [phone]

## 2013-08-09 NOTE — Telephone Encounter (Signed)
error 

## 2013-08-10 DIAGNOSIS — E871 Hypo-osmolality and hyponatremia: Secondary | ICD-10-CM

## 2013-08-10 DIAGNOSIS — J910 Malignant pleural effusion: Principal | ICD-10-CM

## 2013-08-10 LAB — CULTURE, BLOOD (ROUTINE X 2): Culture: NO GROWTH

## 2013-08-10 MED ORDER — LIDOCAINE-PRILOCAINE 2.5-2.5 % EX CREA
TOPICAL_CREAM | Freq: Once | CUTANEOUS | Status: AC
  Administered 2013-08-10: 06:00:00 via TOPICAL
  Filled 2013-08-10: qty 5

## 2013-08-10 NOTE — Progress Notes (Signed)
Provided Pt with bed offers.  Reviewed offers with Pt.  Pt asked CSW to contact her husband to discuss the offers with him, too.  LM for Pt's husband.  Providence Crosby, LCSWA Clinical Social Work [phone]

## 2013-08-10 NOTE — Progress Notes (Signed)
Progress Note:  Subjective: Comfortable at rest on oxygen No complaints  Vitals: Filed Vitals:   08/10/13 0618  BP: 128/66  Pulse: 85  Temp: 98.6 F (37 C)  Resp: 18   Wt Readings from Last 3 Encounters:  08/07/13 231 lb 0.7 oz (104.8 kg)  07/30/13 239 lb (108.41 kg)  07/21/13 236 lb 3.2 oz (107.14 kg)     PHYSICAL EXAM:  General NAD Head:WNL Eyes:WNL Throat: no exudate or ulcer Neck: Lymph Nodes: Lungs:decreased breath sounds both bases Breasts: bilateral mastectomies Cardiac:regular rhythm, no murmur Abdominal: soft, obese, non-tender Extremities:bilateral upper extremity lymphedema Vascular:  No cyanosis Neurologic grossly normal, alert & oriented Skin: no rash  Labs:   Recent Labs  08/09/13 0550  WBC 3.5*  HGB 10.2*  HCT 31.7*  PLT 176    Recent Labs  08/09/13 0550  NA 132*  K 5.1  CL 96  CO2 32  GLUCOSE 100*  BUN 31*  CREATININE 1.33*  CALCIUM 8.7      Images Studies/Results:   Dg Chest 2 View  08/09/2013   CLINICAL DATA:  Recurrent left pleural effusion. Assess left effusion.  EXAM: CHEST  2 VIEW  COMPARISON:  08/07/2013.  FINDINGS: Low volume chest. Recurrent left pleural effusion is present. Some of the fusion layer subpulmonic. The fusion is small and shows no significant interval change from the prior film 08/07/2013. Bilateral axillary dissections and probable bilateral mastectomies. Left subclavian Port-A-Cath is unchanged, with the tip at the confluence of the brachiocephalic veins in the upper SVC. Low volumes accentuate the size of the cardiopericardial silhouette. Atelectasis is present in the left lung, associated with effusion producing mid lung and basilar hazy opacity.  IMPRESSION: 1. No change in the small left pleural effusion which layers posteriorly. 2. Low volume chest.  No change from prior.   Electronically Signed   By: Andreas Newport M.D.   On: 08/09/2013 13:03     Patient Active Problem List   Diagnosis Date  Noted  . Pneumonia 08/03/2013  . CHF exacerbation 08/03/2013  . Pleural effusion 08/03/2013  . Breast cancer metastasized to bone 07/04/2013  . Breast cancer of upper-outer quadrant of left female breast 06/17/2013  . Breast cancer of upper-outer quadrant of right female breast 05/28/2013  . Malignant pleural effusion 08/07/2012  . Pleural effusion on right 07/31/2012  . AKI (acute kidney injury) 07/31/2012  . Healthcare-associated pneumonia 07/29/2012  . Cardiomyopathy 04/20/2012  . 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 Skin lesion of left lower limb 07/26/2011  . Essential hypertension, benign 04/04/2011    Assessment and Plan:  1. Metachronous primary bilateral breast cancers: initial inflammatory cancer right breast dx 9/11 ER positive/Her-2 positive. Neo-adjuvant chemo followed by mastectomy. 2nd primary left breast ca dx July, 2013  ER positive, Her-2 negative S/P left mastectomy Recurrence of right breast cancer  December 2013 with malignant right pleural effusion. Progression of left breast cancer with malignant left effusion, bone, left chest wall, multiple lymph nodes, left pectoral muscle involvement and recurrent right pleural effusion. 06/2013 Now receiving palliative chemo with abraxane. 1st dose 12/11; next dose planned for 12/17  2. Bilateral malignant pleural effusions  3. Hyperkalemia/hponatremia Adrenal insufficiency?  Check AM cortisol level  4. Deconditioning  Family present, status reviewed.    Deyon Chizek M 08/10/2013, 7:33 AM

## 2013-08-10 NOTE — Progress Notes (Signed)
Reviewed bed offers with Pt's husband via phone.  Discussed each facility's location and proximity to Pt's home.  Pt's husband to review offers with Pt's family and have a decision made by Monday.  CSW thanked Pt's husband for his time.  Providence Crosby, LCSWA Clinical Social Work [phone]

## 2013-08-11 ENCOUNTER — Encounter: Payer: Self-pay | Admitting: Radiation Oncology

## 2013-08-11 DIAGNOSIS — D638 Anemia in other chronic diseases classified elsewhere: Secondary | ICD-10-CM

## 2013-08-11 DIAGNOSIS — G893 Neoplasm related pain (acute) (chronic): Secondary | ICD-10-CM

## 2013-08-11 DIAGNOSIS — C801 Malignant (primary) neoplasm, unspecified: Secondary | ICD-10-CM

## 2013-08-11 LAB — BASIC METABOLIC PANEL
BUN: 31 mg/dL — ABNORMAL HIGH (ref 6–23)
CO2: 34 mEq/L — ABNORMAL HIGH (ref 19–32)
Creatinine, Ser: 1.26 mg/dL — ABNORMAL HIGH (ref 0.50–1.10)
GFR calc non Af Amer: 42 mL/min — ABNORMAL LOW (ref >90)
Glucose, Bld: 98 mg/dL (ref 70–99)
Potassium: 5.5 mEq/L — ABNORMAL HIGH (ref 3.5–5.1)
Sodium: 131 mEq/L — ABNORMAL LOW (ref 135–145)

## 2013-08-11 LAB — CORTISOL-AM, BLOOD: Cortisol - AM: 18.9 ug/dL (ref 4.3–22.4)

## 2013-08-11 NOTE — Progress Notes (Signed)
  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 estrogen receptor positive at 72%, progesterone receptor negative.  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  Narrative: The patient tolerated radiation treatment relatively well.   Early during the course of her treatment she began having problems with pain along the right chest region and shortness of breath. Patient did have a thoracentesis along the right side which helped her symptoms minimally. After discussion with medical oncology and the patient, it was felt best to stop the patient's radiation therapy and consider palliative chemotherapy  Plan: When necessary followup in radiation oncology.  The patient may at some point soon require admission to the hospital for her symptoms. -----------------------------------  Billie Lade, PhD, MD

## 2013-08-11 NOTE — Progress Notes (Signed)
  Radiation Oncology         [phone] ________________________________  Name: Jeanette Morris MRN: [id number]  Date: 08/11/2013  DOB: 1946/06/02  Special treatment procedure note-performed on November 11  On November 11 the patient underwent simulation and treatment planning for treatments directed at the left breast region. The patient has a prior history of right chest wall radiation therapy. Given the additional time in reviewing the patient's prior set up as it relates to her current treatment and the additional time involved as well as the risk for potential complications with overlapping fields, this constitutes a special treatment procedure.   -----------------------------------  Billie Lade, PhD, MD

## 2013-08-11 NOTE — Progress Notes (Signed)
Subjective: Ms. Omar is doing well today but still has SOB at baseline, increased with exertion. Husband was at the bed side. No other complaints except for persistent fatigue.  Objective: Vital signs in last 24 hours: Temp:  [98.2 F (36.8 C)-98.5 F (36.9 C)] 98.5 F (36.9 C) (12/14 0508) Pulse Rate:  [80-85] 85 (12/14 0508) Resp:  [14-18] 14 (12/14 0508) BP: (115-154)/(63-68) 135/63 mmHg (12/14 0508) SpO2:  [100 %] 100 % (12/14 0508)  Intake/Output from previous day: 12/13 0701 - 12/14 0700 In: 720 [P.O.:720] Out: 2150 [Urine:2150] Intake/Output this shift:    General appearance: alert, cooperative, fatigued and no distress Resp: clear to auscultation bilaterally Cardio: regular rate and rhythm, S1, S2 normal, no murmur, click, rub or gallop GI: soft, non-tender; bowel sounds normal; no masses,  no organomegaly Extremities: extremities normal, atraumatic, no cyanosis or edema  Lab Results:   Recent Labs  08/09/13 0550  WBC 3.5*  HGB 10.2*  HCT 31.7*  PLT 176   BMET  Recent Labs  08/09/13 0550 08/11/13 0615  NA 132* 131*  K 5.1 5.5*  CL 96 96  CO2 32 34*  GLUCOSE 100* 98  BUN 31* 31*  CREATININE 1.33* 1.26*  CALCIUM 8.7 8.8    Studies/Results: Dg Chest 2 View  08/09/2013   CLINICAL DATA:  Recurrent left pleural effusion. Assess left effusion.  EXAM: CHEST  2 VIEW  COMPARISON:  08/07/2013.  FINDINGS: Low volume chest. Recurrent left pleural effusion is present. Some of the fusion layer subpulmonic. The fusion is small and shows no significant interval change from the prior film 08/07/2013. Bilateral axillary dissections and probable bilateral mastectomies. Left subclavian Port-A-Cath is unchanged, with the tip at the confluence of the brachiocephalic veins in the upper SVC. Low volumes accentuate the size of the cardiopericardial silhouette. Atelectasis is present in the left lung, associated with effusion producing mid lung and basilar hazy opacity.   IMPRESSION: 1. No change in the small left pleural effusion which layers posteriorly. 2. Low volume chest.  No change from prior.   Electronically Signed   By: Andreas Newport M.D.   On: 08/09/2013 13:03    Medications: I have reviewed the patient's current medications.  Assessment/Plan: No change in current plan. 1. Metachronous primary bilateral breast cancers: initial inflammatory cancer right breast dx 9/11 ER positive/Her-2 positive. Neo-adjuvant chemo followed by mastectomy.  2nd primary left breast ca dx July, 2013 ER positive, Her-2 negative S/P left mastectomy  Recurrence of right breast cancer December 2013 with malignant right pleural effusion.  Progression of left breast cancer with malignant left effusion, bone, left chest wall, multiple lymph nodes, left pectoral muscle involvement and recurrent right pleural effusion. 06/2013  Now receiving palliative chemo with abraxane. 1st dose 12/11; next dose planned for 12/17 2.  moderately controlled pain  3.  Hyperkalemia. Will monitor with repeat Bmet. 4.  acute on chronic kidney injury  5.  severe deconditioning  6.  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 08/11/2013

## 2013-08-12 ENCOUNTER — Telehealth: Payer: Self-pay | Admitting: *Deleted

## 2013-08-12 ENCOUNTER — Telehealth: Payer: Self-pay | Admitting: Oncology

## 2013-08-12 ENCOUNTER — Ambulatory Visit: Payer: PRIVATE HEALTH INSURANCE

## 2013-08-12 ENCOUNTER — Encounter (INDEPENDENT_AMBULATORY_CARE_PROVIDER_SITE_OTHER): Payer: PRIVATE HEALTH INSURANCE | Admitting: General Surgery

## 2013-08-12 ENCOUNTER — Other Ambulatory Visit: Payer: Self-pay | Admitting: Oncology

## 2013-08-12 DIAGNOSIS — J90 Pleural effusion, not elsewhere classified: Secondary | ICD-10-CM

## 2013-08-12 DIAGNOSIS — I429 Cardiomyopathy, unspecified: Secondary | ICD-10-CM

## 2013-08-12 LAB — COMPREHENSIVE METABOLIC PANEL
Albumin: 2.3 g/dL — ABNORMAL LOW (ref 3.5–5.2)
Alkaline Phosphatase: 57 U/L (ref 39–117)
BUN: 29 mg/dL — ABNORMAL HIGH (ref 6–23)
Calcium: 8.5 mg/dL (ref 8.4–10.5)
Creatinine, Ser: 1.34 mg/dL — ABNORMAL HIGH (ref 0.50–1.10)
GFR calc Af Amer: 46 mL/min — ABNORMAL LOW (ref >90)
Glucose, Bld: 97 mg/dL (ref 70–99)
Potassium: 5.6 mEq/L — ABNORMAL HIGH (ref 3.5–5.1)
Total Protein: 6.5 g/dL (ref 6.0–8.3)

## 2013-08-12 LAB — CBC WITH DIFFERENTIAL/PLATELET
Basophils Relative: 0 % (ref 0–1)
Eosinophils Absolute: 0.1 10*3/uL (ref 0.0–0.7)
Eosinophils Relative: 3 % (ref 0–5)
HCT: 28.7 % — ABNORMAL LOW (ref 36.0–46.0)
Hemoglobin: 9.3 g/dL — ABNORMAL LOW (ref 12.0–15.0)
Lymphs Abs: 0.4 10*3/uL — ABNORMAL LOW (ref 0.7–4.0)
MCH: 29.8 pg (ref 26.0–34.0)
MCHC: 32.4 g/dL (ref 30.0–36.0)
MCV: 92 fL (ref 78.0–100.0)
Monocytes Absolute: 0.1 10*3/uL (ref 0.1–1.0)
Monocytes Relative: 5 % (ref 3–12)
Neutrophils Relative %: 76 % (ref 43–77)
RBC: 3.12 MIL/uL — ABNORMAL LOW (ref 3.87–5.11)
RDW: 12.9 % (ref 11.5–15.5)

## 2013-08-12 MED ORDER — SENNA 8.6 MG PO TABS: 1.0000 | ORAL_TABLET | Freq: Every day | ORAL | Status: DC | PRN

## 2013-08-12 MED ORDER — HEPARIN SOD (PORK) LOCK FLUSH 100 UNIT/ML IV SOLN
500.0000 [IU] | INTRAVENOUS | Status: AC | PRN
  Administered 2013-08-12: 500 [IU]

## 2013-08-12 MED ORDER — OXYCODONE HCL 5 MG PO TABS: 5.0000 mg | ORAL_TABLET | ORAL | Status: DC | PRN

## 2013-08-12 MED ORDER — ONDANSETRON HCL 8 MG PO TABS: 8.0000 mg | ORAL_TABLET | Freq: Three times a day (TID) | ORAL | Status: DC | PRN

## 2013-08-12 MED ORDER — CARVEDILOL 6.25 MG PO TABS: 6.2500 mg | ORAL_TABLET | Freq: Two times a day (BID) | ORAL | Status: DC

## 2013-08-12 MED ORDER — ACETAMINOPHEN 325 MG PO TABS: 650.0000 mg | ORAL_TABLET | Freq: Four times a day (QID) | ORAL | Status: DC | PRN

## 2013-08-12 MED ORDER — POLYETHYLENE GLYCOL 3350 17 G PO PACK: 17.0000 g | PACK | Freq: Every day | ORAL | Status: AC

## 2013-08-12 MED ORDER — OXYCODONE HCL ER 20 MG PO T12A: 20.0000 mg | EXTENDED_RELEASE_TABLET | Freq: Two times a day (BID) | ORAL | Status: DC

## 2013-08-12 NOTE — Progress Notes (Signed)
CSW received notification from RN that MD rounded on pt and pt medically ready for discharge today.  CSW met with pt and pt husband at bedside who confirmed choice for [facility].  CSW contacted [facility] who confirmed that facility had private room availability and could accept pt today.  CSW facilitated pt discharge needs including contacting facility, faxing pt discharge information via [facility], discussing with pt and pt husband at bedside, providing RN phone number to call report, and arranging ambulance transport for pt to [facility]  No further social work needs identified at this time.  CSW signing off.   Jacklynn Lewis, MSW, LCSW Clinical Social Work [phone]

## 2013-08-12 NOTE — Telephone Encounter (Signed)
Sent Jeanette Morris a staff message to add the chemo tx on 08/14/2013.

## 2013-08-12 NOTE — Care Management Note (Addendum)
Cm conducted utilization review. Per MD advisor, Pt no longer meets INPT criteria.MD made aware.    Roxy Manns Brookie Wayment,MSN,RN [phone]

## 2013-08-12 NOTE — Telephone Encounter (Signed)
Per staff message and POF I have scheduled appts.  JMW  

## 2013-08-12 NOTE — Progress Notes (Signed)
Patient stable at time of discharge. Reported off to [employer] at [facility].

## 2013-08-12 NOTE — Progress Notes (Signed)
Occupational Therapy Treatment Patient Details Name: Mellanie Bejarano MRN: [id number] DOB: [birth year] Today's Date: 08/12/2013 Time: 4782-9562 OT Time Calculation (min): 34 min  OT Assessment / Plan / Recommendation  History of present illness 67 yo female admitted with pleural effuison. s/p thoracentesis 12/8. Hx of met breast cancer, bil mastectomies, bil UE lymphedema.    OT comments  OT wrapped RUE this OT visit  Follow Up Recommendations  SNF;CIR;Home health OT;Other (comment) (depending on family A)                      Precautions / Restrictions Precautions Precautions: Fall Precaution Comments: BUE lymphedema Restrictions Weight Bearing Restrictions: No       ADL  ADL Comments: OT wrapped RUE with lymphedema.  RN aware.  Pt did well tolerating wrap - OT   wil recheck wrap later this afternoon.        OT Goals(current goals can now be found in the care plan section)    Visit Information  Last OT Received On: 08/12/13 History of Present Illness: 67 yo female admitted with pleural effuison. s/p thoracentesis 12/8. Hx of met breast cancer, bil mastectomies, bil UE lymphedema.           Cognition  Cognition Arousal/Alertness: Awake/alert Behavior During Therapy: WFL for tasks assessed/performed Overall Cognitive Status: Within Functional Limits for tasks assessed             End of Session OT - End of Session Activity Tolerance: Patient tolerated treatment well Patient left: in chair  GO     Artie Takayama, [place] Kung 08/12/2013, 11:42 AM

## 2013-08-12 NOTE — Telephone Encounter (Signed)
S/w the pt's dtr and she is aware of the pt's appt on 08/14/2013@12 :15pm. Per pt's dtr the pt is still in the hospital

## 2013-08-12 NOTE — Progress Notes (Signed)
[facility] called for 3:00p pickup (Service Request Id: [id number]). Husband & RN, Eber Jones aware.   Unice Bailey, LCSW [facility] Clinical Social Worker cell #: [phone]

## 2013-08-12 NOTE — Progress Notes (Signed)
CSW met with pt and pt husband at bedside to discuss SNF placement.  CSW clarified pt and pt husband questions re: SNF placement.   Pt and pt husband discussed that they are leaning toward [facility], but pt husband plans to go tour facility this morning.  Pt husband will notify this CSW after touring facility and discussing with pt if they are agreeable to accepting bed offer.  Pt appears to be coping well with plans to transition to SNF for rehabilitation. Pt husband is very supportive of pt and actively involved in pt care.  CSW to continue to follow to assist with pt discharge planning needs.    Jacklynn Lewis, MSW, LCSWA  Clinical Social Work [phone]

## 2013-08-12 NOTE — Progress Notes (Signed)
Occupational Therapy Treatment Patient Details Name: Mahitha Hickling MRN: [id number] DOB: [birth year] Today's Date: 08/12/2013 Time: 4782-9562 OT Time Calculation (min): 40 min  OT Assessment / Plan / Recommendation  History of present illness 67 yo female admitted with pleural effuison. s/p thoracentesis 12/8. Hx of met breast cancer, bil mastectomies, bil UE lymphedema.    OT comments  Per husband there SNF i which pt is going is familiar with lymphedema  Follow Up Recommendations  SNF       Equipment Recommendations  None recommended by OT          Progress towards OT Goals Progress towards OT goals: Progressing toward goals  Plan Discharge plan remains appropriate    Precautions / Restrictions Precautions Precautions: Fall Precaution Comments: BUE lymphedema Restrictions Weight Bearing Restrictions: No         OT Goals(current goals can now be found in the care plan section)    Visit Information  Last OT Received On: 08/12/13 History of Present Illness: 67 yo female admitted with pleural effuison. s/p thoracentesis 12/8. Hx of met breast cancer, bil mastectomies, bil UE lymphedema.           Cognition  Cognition Arousal/Alertness: Awake/alert Behavior During Therapy: WFL for tasks assessed/performed Overall Cognitive Status: Within Functional Limits for tasks assessed             End of Session OT - End of Session Activity Tolerance: Patient tolerated treatment well Patient left: in bed;with family/visitor present  GO     Panfilo Ketchum, [place] Kung 08/12/2013, 2:28 PM

## 2013-08-12 NOTE — Discharge Summary (Signed)
Physician Discharge Summary  Patient ID: Sammie Schermerhorn MRN: [id number] DOB/AGE: 69/07/1946 69 y.o.  Admit date: 08/03/2013 Discharge date: 08/12/2013  Primary Care Physician:  Egbert Garibaldi, NP   Discharge Diagnoses:  Acute on chronic shortness of breath, worsening pleural effusions, poorly controlled pain, poorly controlled nausea, severe deconditioning, severe constipation, anemia due to chemotherapy  Present on Admission:  . Pneumonia . CHF exacerbation . Breast cancer metastasized to bone . Pleural effusion  Discharge Medications:    Medication List    STOP taking these medications       HYDROmorphone 4 MG tablet  Commonly known as:  DILAUDID     spironolactone 25 MG tablet  Commonly known as:  ALDACTONE     tamoxifen 20 MG tablet  Commonly known as:  NOLVADEX      TAKE these medications       acetaminophen 325 MG tablet  Commonly known as:  TYLENOL  Take 2 tablets (650 mg total) by mouth every 6 (six) hours as needed for mild pain (or Fever >/= 101).     Calcium Carbonate-Vitamin D 600-400 MG-UNIT per tablet  Take 1 tablet by mouth every morning.     carvedilol 6.25 MG tablet  Commonly known as:  COREG  Take 1 tablet (6.25 mg total) by mouth 2 (two) times daily with a meal. She takes with a 12.5 mg tablet to equal her dose of 18.75 mg.     cyanocobalamin 500 MCG tablet  Take 500 mcg by mouth daily with breakfast.     ferrous sulfate 325 (65 FE) MG tablet  Take 1 tablet (325 mg total) by mouth daily with breakfast.     furosemide 20 MG tablet  Commonly known as:  LASIX  Take 1 tablet (20 mg total) by mouth daily.     gabapentin 300 MG capsule  Commonly known as:  NEURONTIN  Take 300 mg by mouth at bedtime.     ondansetron 8 MG tablet  Commonly known as:  ZOFRAN  Take 1 tablet (8 mg total) by mouth every 8 (eight) hours as needed for nausea or vomiting. Take two times a day starting the day after chemo for 2 days. Then take two times  a day as needed for nausea or vomiting.     OxyCODONE 20 mg T12a 12 hr tablet  Commonly known as:  OXYCONTIN  Take 1 tablet (20 mg total) by mouth every 12 (twelve) hours.     oxyCODONE 5 MG immediate release tablet  Commonly known as:  Oxy IR/ROXICODONE  Take 1 tablet (5 mg total) by mouth every 4 (four) hours as needed for severe pain.     polyethylene glycol packet  Commonly known as:  MIRALAX / GLYCOLAX  Take 17 g by mouth daily.     prochlorperazine 10 MG tablet  Commonly known as:  COMPAZINE  Take 1 tablet (10 mg total) by mouth every 6 (six) hours as needed (Nausea or vomiting).     senna 8.6 MG Tabs tablet  Commonly known as:  SENOKOT  Take 1 tablet (8.6 mg total) by mouth daily as needed for mild constipation.         Disposition and Follow-up: to SNF on 08/12/2914; to the [facility] 08/14/2013 at 12:15 PM for labs, visit, and chemotherapy  Significant Diagnostic Studies:  Dg Chest 1 View  08/05/2013   CLINICAL DATA:  Status post left thoracentesis.  EXAM: CHEST - 1 VIEW  COMPARISON:  Chest  radiographs 08/03/2013  FINDINGS: Left subclavian central venous catheter remains in place with tip overlying the mid SVC, unchanged. The cardiomediastinal silhouette is within normal limits. The patient has taken a slightly shallower inspiration than on the prior study. Left pleural effusion has decreased in size. Small right pleural effusion does not appear significantly changed. No definite pneumothorax is identified. Mild central pulmonary vascular congestion is unchanged. Surgical clips are again noted in both axillae.  IMPRESSION: Decreased size of left pleural effusion. No pneumothorax identified.   Electronically Signed   By: Sebastian Ache   On: 08/05/2013 11:12   Dg Chest 1 View  07/26/2013   CLINICAL DATA:  Pleural effusions, status post right thoracentesis  EXAM: CHEST - 1 VIEW  COMPARISON:  07/22/2013  FINDINGS: Postop changes of the axillary regions and breast  bilaterally. No pneumothorax following right thoracentesis. Trace amount of right pleural fluid remains. Small left effusion also noted. Scattered bibasilar atelectasis. Normal heart size and vascularity. No superimposed edema. Left subclavian port catheter tip at the innominate venous confluence. 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   Electronically Signed   By: Ruel Favors M.D.   On: 07/26/2013 11:47   Dg Chest 2 View  08/09/2013   CLINICAL DATA:  Recurrent left pleural effusion. Assess left effusion.  EXAM: CHEST  2 VIEW  COMPARISON:  08/07/2013.  FINDINGS: Low volume chest. Recurrent left pleural effusion is present. Some of the fusion layer subpulmonic. The fusion is small and shows no significant interval change from the prior film 08/07/2013. Bilateral axillary dissections and probable bilateral mastectomies. Left subclavian Port-A-Cath is unchanged, with the tip at the confluence of the brachiocephalic veins in the upper SVC. Low volumes accentuate the size of the cardiopericardial silhouette. Atelectasis is present in the left lung, associated with effusion producing mid lung and basilar hazy opacity.  IMPRESSION: 1. No change in the small left pleural effusion which layers posteriorly. 2. Low volume chest.  No change from prior.   Electronically Signed   By: Andreas Newport M.D.   On: 08/09/2013 13:03   Dg Chest 2 View  08/07/2013   CLINICAL DATA:  History of pleural effusion.  EXAM: CHEST  2 VIEW  COMPARISON:  Chest x-ray of August 05, 2013.  FINDINGS: The lungs are reasonably well inflated. The left hemidiaphragm remains obscured. There is blunting of the posterior costophrenic angles bilaterally with a moderate-sized effusion likely present on the left. The cardiopericardial silhouette is enlarged. The central pulmonary vascularity is engorged. The  pulmonary interstitial markings are slightly more conspicuous today. There are surgical clips in the axillary regions. There is a left subclavian venous catheter in place whose tip lies in the region of the junction of the right and left brachiocephalic veins.  IMPRESSION: 1. There remains a small to moderate size left pleural effusion and small right pleural effusion. These findings do not appear does have significantly changed since the previous study. 2. The the cardiopericardial silhouette remains enlarged but the pulmonary vascularity is more engorged today in the pulmonary interstitial markings are more conspicuous.   Electronically Signed   By: David  [place]   On: 08/07/2013 10:29   Dg Chest 2 View  08/03/2013   CLINICAL DATA:  Dyspnea.  History of smoking.  EXAM: CHEST  2 VIEW  COMPARISON:  Chest radiograph performed 07/26/2013  FINDINGS: The lungs are hypoexpanded. Moderate left and small right  pleural effusions are seen, with associated left basilar airspace opacification. Underlying vascular crowding and vascular congestion are seen. This may reflect pneumonia or possibly asymmetric pulmonary edema. No pneumothorax is identified.  The heart remains normal in size. A left-sided subclavian line is noted ending about the mid SVC. No acute osseous abnormalities are identified. Clips are seen at the axilla bilaterally.  IMPRESSION: Lungs hypoexpanded. Moderate left and small right pleural effusions, with associated left basilar airspace opacification. This may reflect pneumonia or possibly asymmetric pulmonary edema.   Electronically Signed   By: Roanna Raider M.D.   On: 08/03/2013 21:38   Dg Chest 2 View  07/22/2013   CLINICAL DATA:  Lymphedema in the arms.  EXAM: CHEST  2 VIEW  COMPARISON:  07/21/2013  FINDINGS: Low volume film. The cardio pericardial silhouette is enlarged. And vascular congestion noted without overt airspace pulmonary edema. Left Port-A-Cath remains in place with tip overlying the  innominate vein confluence. The tip of the catheter is directed laterally against the vessel wall.  Right base atelectasis or infiltrate noted. Tiny bilateral pleural effusions are again evident. Telemetry leads overlie the chest.  IMPRESSION: No substantial interval change in exam.   Electronically Signed   By: Kennith Center M.D.   On: 07/22/2013 11:56   Dg Chest 2 View  07/21/2013   CLINICAL DATA:  Shortness of breath, right pleural effusion.  EXAM: CHEST  2 VIEW  COMPARISON:  May 28, 2013.  FINDINGS: Stable cardiomediastinal silhouette. Surgical clips are noted in both axillary regions. Left subclavian Port-A-Cath is unchanged in position. Bilateral pleural effusions are noted which are slightly increased compared to prior exam. No pneumothorax is noted.  IMPRESSION: Mild bilateral pleural effusions are noted which are increased compared to prior exam.   Electronically Signed   By: Roque Lias M.D.   On: 07/21/2013 09:20   Ct Angio Chest Pe W/cm &/or Wo Cm  07/22/2013   CLINICAL DATA:  Short of breath.  History of breast carcinoma.  EXAM: CT ANGIOGRAPHY CHEST WITH CONTRAST  TECHNIQUE: Multidetector CT imaging of the chest was performed using the standard protocol during bolus administration of intravenous contrast. Multiplanar CT image reconstructions including MIPs were obtained to evaluate the vascular anatomy.  CONTRAST:  OMNIPAQUE IOHEXOL 350 MG/ML SOLN  COMPARISON:  12/18/2012  FINDINGS: The study is limited by some artifact from the arms across the posterior Chest and is somewhat some optimal bolus as well as motion which defects evaluation of the smaller segmental and subsegmental arteries. Allowing for this, there is no evidence of a pulmonary embolism.  There is heterogeneous enlargement of thyroid gland with substernal extension. This is predominantly of the left lobe. There are areas of low attenuation is well is calcification, all stable.  There is left chest wall skin thickening  underlying subcutaneous edema and areas of nodular soft tissue with more confluent soft tissue attenuation along the inferior pectoralis major. Shotty adenopathy is noted at the left neck base, the largest discrete node measuring 17 mm in short axis. There is also adenopathy at the right neck base with the largest discrete node measuring 16 mm in short axis. The largest area of subcutaneous nodular soft tissue in the left medial anterior chest measures 18 mm x 15 mm. The fat planes at the neck base and along the axilla There are infiltrated 5 reticular and hazy opacity, greater on the left.  A left mastectomy has been performed since the prior exam. There are changes from a  right mastectomy, stable.  The heart is mildly enlarged. There are coronary artery calcifications. There are mildly enlarged and prominent mediastinal lymph nodes. Reference measurement of 1 of the larger, right peritracheal node, is 14 mm in short axis. No hilar masses or discrete enlarged lymph nodes are seen. There are moderate bilateral pleural effusions with associated compressive atelectasis. There is no pulmonary edema. No discrete lung mass or nodule is seen.  There is aggressive type resorption of the 1st rib where it abuts an area of ill-defined soft tissue at the left neck base. This is new. Milder erosion is noted along the superior aspect of the posterior lateral 2nd rib. This is also new. There osteolytic lesions along the lower thoracic spine on the left.  Review of the MIP images confirms the above findings.  IMPRESSION: 1. Exam is limited as detailed above. Allowing for this, there is no evidence of a pulmonary embolus. 2. Metastatic breast carcinoma with ill-defined neck base adenopathy, abnormal left anterior chest wall soft tissue consistent with metastatic and/or locally invasive disease, and osteolytic lesions consistent with metastatic disease. 3. Moderate bilateral pleural effusions which could be malignant. No discrete  pleural based mass is seen. There is associated compressive atelectasis but no pulmonary edema. No pulmonary nodule or mass is seen. 4. Metastatic adenopathy, lytic bone lesions, left anterior chest wall abnormal soft tissue and the left pleural effusion are new from the prior study. The right pleural effusion has increased in size. Atelectasis is new.   Electronically Signed   By: Amie Portland M.D.   On: 07/22/2013 14:19   US Thoracentesis Asp Pleural Space W/img Guide  08/05/2013   CLINICAL DATA:  Left pleural effusion  EXAM: ULTRASOUND GUIDED left THORACENTESIS  COMPARISON:  None  FINDINGS: A total of approximately 700 cc of yellow fluid was removed. A fluid sample was notsent for laboratory analysis.  IMPRESSION: Successful ultrasound guided left thoracentesis yielding 700 cc of pleural fluid.  Read by: Beckey Downing PA-C  PROCEDURE: An ultrasound guided thoracentesis was thoroughly discussed with the patient and questions answered. The benefits, risks, alternatives and complications were also discussed. The patient understands and wishes to proceed with the procedure. Written consent was obtained.  Ultrasound was performed to localize and mark an adequate pocket of fluid in the left chest. The area was then prepped and draped in the normal sterile fashion. 1% Lidocaine was used for local anesthesia. Under ultrasound guidance a 19 gauge Yueh catheter was introduced. Thoracentesis was performed. The catheter was removed and a dressing applied.  Complications:  None   Electronically Signed   By: Simonne Come M.D.   On: 08/05/2013 11:08   US Thoracentesis Asp Pleural Space W/img Guide  07/26/2013   EXAM: ULTRASOUND GUIDED THERAPEUTIC RIGHT THORACENTESIS  COMPARISON:  PREVIOUS RIGHT THORACENTESIS ON 08/01/2012  FINDINGS: A total of approximately 600 cc's of yellow fluid was removed.  IMPRESSION: Successful ultrasound guided therapeutic right thoracentesis yielding 600 cc's of pleural fluid.  Read by: Jeananne Rama ,P.A.-C.  PROCEDURE: An ultrasound guided thoracentesis was thoroughly discussed with the patient and questions answered. The benefits, risks, alternatives and complications were also discussed. The patient understands and wishes to proceed with the procedure. Written consent was obtained.  Ultrasound was performed to localize and mark an adequate pocket of fluid in the right chest. The area was then prepped and draped in the normal sterile fashion. 1% Lidocaine was used for local anesthesia. Under ultrasound guidance a 19 gauge Yueh catheter was  introduced. Thoracentesis was performed. The catheter was removed and a dressing applied.  Complications:  none   Electronically Signed   By: Ruel Favors M.D.   On: 07/26/2013 12:14    Discharge Laboratory Values: Basic Metabolic Panel:  Recent Labs Lab 08/06/13 0415 08/07/13 0530 08/09/13 0550 08/11/13 0615 08/12/13 0600  NA 130* 128* 132* 131* 129*  K 4.8 5.0 5.1 5.5* 5.6*  CL 96 94* 96 96 93*  CO2 28 29 32 34* 33*  GLUCOSE 99 96 100* 98 97  BUN 25* 28* 31* 31* 29*  CREATININE 1.56* 1.70* 1.33* 1.26* 1.34*  CALCIUM 8.5 8.5 8.7 8.8 8.5   GFR Estimated Creatinine Clearance: 46.7 ml/min (by C-G formula based on Cr of 1.34). Liver Function Tests:  Recent Labs Lab 08/07/13 0530 08/09/13 0550 08/12/13 0600  AST 33 42* 42*  ALT 24 19 19   ALKPHOS 75 64 57  BILITOT 0.3 0.5 0.5  PROT 6.9 7.1 6.5  ALBUMIN 2.2* 2.4* 2.3*   No results found for this basename: LIPASE, AMYLASE,  in the last 168 hours No results found for this basename: AMMONIA,  in the last 168 hours Coagulation profile No results found for this basename: INR, PROTIME,  in the last 168 hours  CBC:  Recent Labs Lab 08/06/13 0415 08/07/13 0530 08/09/13 0550 08/12/13 0600  WBC 3.4* 2.9* 3.5* 2.7*  NEUTROABS  --  1.9  --  2.0  HGB 8.3* 7.9* 10.2* 9.3*  HCT 25.4* 25.0* 31.7* 28.7*  MCV 91.4 92.6 90.3 92.0  PLT 192 187 176 128*   Cardiac Enzymes: No results  found for this basename: CKTOTAL, CKMB, CKMBINDEX, TROPONINI,  in the last 168 hours BNP: No components found with this basename: POCBNP,  CBG: No results found for this basename: GLUCAP,  in the last 168 hours D-Dimer No results found for this basename: DDIMER,  in the last 72 hours Hgb A1c No results found for this basename: HGBA1C,  in the last 72 hours Lipid Profile No results found for this basename: CHOL, HDL, LDLCALC, TRIG, CHOLHDL, LDLDIRECT,  in the last 72 hours Thyroid function studies No results found for this basename: TSH, T4TOTAL, FREET3, T3FREE, THYROIDAB,  in the last 72 hours Anemia work up No results found for this basename: VITAMINB12, FOLATE, FERRITIN, TIBC, IRON, RETICCTPCT,  in the last 72 hours Microbiology Recent Results (from the past 240 hour(s))  CULTURE, BLOOD (ROUTINE X 2)     Status: None   Collection Time    08/03/13 11:45 PM      Result Value Range Status   Specimen Description BLOOD PORTA CATH   Final   Special Requests BOTTLES DRAWN AEROBIC AND ANAEROBIC 5CC   Final   Culture  Setup Time     Final   Value: 08/04/2013 13:55     Performed at [facility]   Culture     Final   Value: NO GROWTH 5 DAYS     Performed at [facility]   Report Status 08/10/2013 FINAL   Final  CULTURE, BLOOD (ROUTINE X 2)     Status: None   Collection Time    08/04/13 12:01 AM      Result Value Range Status   Specimen Description BLOOD LEFT FOOT   Final   Special Requests BOTTLES DRAWN AEROBIC AND ANAEROBIC 5CC   Final   Culture  Setup Time     Final   Value: 08/04/2013 13:55     Performed at [facility]   Culture     Final   Value: NO GROWTH 5 DAYS     Performed at [facility]   Report Status 08/10/2013 FINAL   Final     Brief H and P: For complete details please refer to admission H and P, but in brief, the patient was admitted with worsening SOB and poorly controlled pain. Fur further details see Hospital Course.  Physical  Exam at Discharge: BP 138/61  Pulse 81  Temp(Src) 98.4 F (36.9 C) (Oral)  Resp 18  Ht 5\' 4"  (1.626 m)  Wt 231 lb 0.7 oz (104.8 kg)  BMI 39.64 kg/m2  SpO2 100% Gen: older African American woman examined in bed Cardiovascular: RRR Respiratory:poor excursion bilaterally, no wheezes Gastrointestinal: +BS, NT Extremities:bilateral upper extremity edema    Hospital Course:  Patient was admitted with worsening shortness of breath, found to have a large left effusion which was tapped. She was started on O2 and referrals were made for OT and PT, with their assessments appended to this report. Patient's pain was initially controlled with IV narcotics but she was switched to oran oxycontin/oxycodone at discharge doses and her pain has been moderately to well-controlled on this regimen.  Despite the patient's very poor functional status she is very clear that she desires further treatment for her metastatic cancer, and given her good response and fair tolerance to Abraxane previously, we decided to resume her tyreatments on 08/07/2013. She tolerated that dose well, and she is scheduled to receive the day 8 dose in our office at the [facility] 12:15 PM DEC 17.  I have discussed the overall situation with the patient and her husband. They are eager for discharge today as she has a private room available and waiting on her discharge might put her in a shared room, which would be much l;ess desirable and possibly even dangerous given her possible low counts from chemo. She remains full code.   Principal Problem:   Pleural effusion Active Problems:   Cardiomyopathy due to chemotherapy   Breast cancer metastasized to bone   Pneumonia   CHF exacerbation  67 y.o. 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  (2) neoadjuvant chemotherapy consisted of 6 cycles of standard carboplatin, docetaxel, trastuzumab, with the trastuzumab held cycles 5 and 6 due to a drop in her borderline ejection fraction (from 45-50% at baseline to 40-45% January 2012)-- total trastuzumab treatment = 2 months  (3) status post right mastectomy and axillary lymph node dissection 12/01/2010, showing no residual invasive carcinoma in the breast, but residual tumor in 20 of 21 axillary lymph nodes. (Only one of the 20 positive lymph nodes showed a significant residual tumor, measuring 5 mm; the remaining lymph nodes, including the evidence of extracapsular extension, showed single and clusters a few neoplastic cells with extensive therapy related changes).  (4) postmastectomy radiation to the right chest wall, axilla, and supraclavicular region completed 03/07/2011  (5) additional 2 months of trastuzumab given February through April 2013, again discontinued because of a drop in the ejection fraction and lateral S'  (6) on letrozole July 2012 to September 2013  (7) LEFT axillary adenopathy noted by Dr. Carolynne Edouard on exam July 2013,  with biopsy of a left axillary node 04/17/2012 showing an invasive ductal carcinoma, estrogen receptor 91% positive, progesterone receptor 9% positive, with an MIB-1 of 45%, and no HER-2 amplification.  (8) PET scan 04/03/2012 showed multiple enlarged left axillary and small left supraclavicular lymph nodes that are moderately hypermetabolic, with some hypermetabolic activity within the left axillary tail. No other hypermetabolic lymph nodes identified. There was no involvement of the right axilla, mediastinum or right chest wall and no extrathoracic metastases were demonstrated.  (8) on exemestane and everolimus September through December 2013  (9) s/p RIGHT thoracentesis 08/01/2012, cytologically  positive; s/p temporary Pleurx; effusion resolved after Abraxane therapy  (10) Abraxane started 08/16/2012, given day 1 and day 8 of each 21 day cycle, completed 6 cycles (12 doses) 12/06/2012, with excellent response  (11) fulvestrant started 12/19/2012, stopped 04/11/2013 with progression  (12) trastuzumab resumed 12/21/2011. We scheduled the doses 4 weeks apart to see if they caould be better tolerated. However echo 02/04/2013 again showed a drop in the EF and S', so Herceptin stopped (last dose 01/17/2013). Most recent echo, 04/18/2013, shows the ejection fraction to have recovered.  (13) status post left modified radical mastectomy 05/08/2013 for a pT4, pN2a [tumor extended over 15 cm, all 6 sampled lymph nodes were involved) invasive lobular breast cancer, grade 3, which was HER-2 negative. It was estrogen receptor positive at 72%, progesterone receptor negative. Posterior margin was broadly positive  (14) tamoxifen started 05/29/2013  (15) post-mastectomy irradiation to left chest wall and left [place] fossa, beginning 07/09/2013, interrupted 07/30/2013  (16) new baseline staging scans including a chest CT and PET scan were obtained 07/02/2013. These showed definite progression of disease, when compared to previous scans in August 2013.  (17) s/p left thoracentesis 08/05/2013, cytology pending  (18) moderately controlled pain  (19) hyperkalemia  (20) acute on chronic kidney injury  (21) severe deconditioning  (22) anemia due to cancer and chronic illness  (23) Abraxane resumed 08/17/2013, next dose due 08/14/2013  Diet: regualr   Activity:  Fall precautions; physical and occupational therapy; daily wrap R upper extremity,use compression sleeve L UE  Condition at Discharge:   improved  Signed: Dr. Ruthann Cancer [phone]  08/12/2013, 1:37 PM  OT Assessment / Plan / Recommendation  History of present illness  67 yo female admitted with pleural effuison. s/p thoracentesis 12/8. Hx of  met breast cancer, bil mastectomies, bil UE lymphedema.   OT comments  OT wrapped RUE this OT visit   Follow Up Recommendations  SNF;CIR;Home health OT;Other (comment) (depending on family A)                     Precautions / Restrictions  Precautions  Precautions: Fall  Precaution Comments: BUE lymphedema  Restrictions  Weight Bearing Restrictions: No       ADL  ADL Comments: OT wrapped RUE with lymphedema. RN aware. Pt did well tolerating wrap - OT wil recheck wrap later this afternoon.   OT Goals(current goals can now be found in the care plan section)  Visit Information  Last OT Received On: 08/12/13  History of Present Illness: 67 yo female admitted with pleural effuison. s/p thoracentesis 12/8. Hx of met breast cancer, bil mastectomies, bil UE lymphedema.         Cognition  Cognition  Arousal/Alertness: Awake/alert  Behavior During Therapy: WFL for tasks assessed/performed  Overall Cognitive Status: Within Functional Limits for tasks assessed            End  of Session  OT - End of Session  Activity Tolerance: Patient tolerated treatment well  Patient left: in chair   GO  REDDING, Metro Kung  08/12/2013, 11:42 AM  PT Assessment / Plan / Recommendation    History of Present Illness  67 yo female admitted with pleural effuison. s/p thoracentesis 12/8. Hx of met breast cancer, bil mastectomies, bil UE lymphedema.    PT Comments  Progressing with mobility. Still has dyspnea with exertion. Did not remove O2 for ambulation. Pt states plan is for her to d/c on tomorrow.    Follow Up Recommendations  Home health PT;Supervision/Assistance - 24 hour    Does the patient have the potential to tolerate intense rehabilitation     Barriers to Discharge     Equipment Recommendations  Rolling walker with 5" wheels    Recommendations for Other Services  OT consult    Frequency  Min 3X/week    Progress towards PT Goals  Progress towards PT goals: Progressing toward goals    Plan   Current plan remains appropriate    Precautions / Restrictions  Precautions  Precautions: Fall  Precaution Comments: BUE lymphedema  Restrictions  Weight Bearing Restrictions: No    Pertinent Vitals/Pain  No c/o pain    Mobility  Bed Mobility  Bed Mobility: Sit to Supine  Sit to Supine: 4: Min assist  Details for Bed Mobility Assistance: Assist for LEs.  Transfers  Transfers: Sit to Stand;Stand to Sit  Sit to Stand: 4: Min guard;From chair/3-in-1  Stand to Sit: 4: Min guard;To bed  Details for Transfer Assistance: VCs safety, hand placement  Ambulation/Gait  Ambulation/Gait Assistance: 4: Min guard  Ambulation Distance (Feet): 100 Feet  Assistive device: Rolling walker  Ambulation/Gait Assistance Details: slow gait speed. remained on 2L O2 for ambulation. fatigues fairly easily.  Gait Pattern: Step-through pattern    Exercises     PT Diagnosis:  PT Problem List:  PT Treatment Interventions:  PT Goals (current goals can now be found in the care plan section)    Visit Information  Last PT Received On: 08/08/13  Assistance Needed: +1  History of Present Illness: 67 yo female admitted with pleural effuison. s/p thoracentesis 12/8. Hx of met breast cancer, bil mastectomies, bil UE lymphedema.    Subjective Data     Cognition  Cognition  Arousal/Alertness: Awake/alert  Behavior During Therapy: WFL for tasks assessed/performed  Overall Cognitive Status: Within Functional Limits for tasks assessed    Balance     End of Session  PT - End of Session  Equipment Utilized During Treatment: Gait belt;Oxygen  Activity Tolerance: Patient limited by fatigue  Patient left: in bed;with call bell/phone within reach    GP  Rebeca Alert, MPT  Pager: [phone]

## 2013-08-12 NOTE — Telephone Encounter (Signed)
x

## 2013-08-13 ENCOUNTER — Telehealth: Payer: Self-pay | Admitting: *Deleted

## 2013-08-13 ENCOUNTER — Encounter (INDEPENDENT_AMBULATORY_CARE_PROVIDER_SITE_OTHER): Payer: Self-pay

## 2013-08-13 ENCOUNTER — Ambulatory Visit: Payer: PRIVATE HEALTH INSURANCE

## 2013-08-13 ENCOUNTER — Other Ambulatory Visit: Payer: Self-pay | Admitting: Oncology

## 2013-08-13 ENCOUNTER — Ambulatory Visit (INDEPENDENT_AMBULATORY_CARE_PROVIDER_SITE_OTHER): Payer: PRIVATE HEALTH INSURANCE | Admitting: General Surgery

## 2013-08-13 ENCOUNTER — Encounter (INDEPENDENT_AMBULATORY_CARE_PROVIDER_SITE_OTHER): Payer: Self-pay | Admitting: General Surgery

## 2013-08-13 ENCOUNTER — Encounter (INDEPENDENT_AMBULATORY_CARE_PROVIDER_SITE_OTHER): Payer: PRIVATE HEALTH INSURANCE | Admitting: General Surgery

## 2013-08-13 VITALS — HR 88 | Temp 97.8°F | Resp 20

## 2013-08-13 DIAGNOSIS — C50919 Malignant neoplasm of unspecified site of unspecified female breast: Secondary | ICD-10-CM

## 2013-08-13 DIAGNOSIS — C7951 Secondary malignant neoplasm of bone: Secondary | ICD-10-CM

## 2013-08-13 DIAGNOSIS — C50912 Malignant neoplasm of unspecified site of left female breast: Secondary | ICD-10-CM

## 2013-08-13 DIAGNOSIS — C50412 Malignant neoplasm of upper-outer quadrant of left female breast: Secondary | ICD-10-CM

## 2013-08-13 DIAGNOSIS — C50419 Malignant neoplasm of upper-outer quadrant of unspecified female breast: Secondary | ICD-10-CM

## 2013-08-13 NOTE — Telephone Encounter (Signed)
Open by Noel Christmas

## 2013-08-13 NOTE — Progress Notes (Signed)
Subjective:     Patient ID: Jeanette Morris, female   DOB: July 18, 1946, 67 y.o.   MRN: [id number]  HPI The patient is a 67 year old black female who is 3 months status post left modified radical mastectomy for metastatic breast cancer. She feels terrible today and looks worse. She is in a wheelchair today and is not able to ambulate without assistance. She is having worsening pain in her arms from lymphedema.  Review of Systems  Constitutional: Positive for fatigue.  HENT: Negative.   Eyes: Negative.   Respiratory: Negative.   Cardiovascular: Positive for chest pain.  Gastrointestinal: Negative.   Endocrine: Negative.   Genitourinary: Negative.   Musculoskeletal: Positive for joint swelling and myalgias.  Skin: Negative.   Allergic/Immunologic: Negative.   Neurological: Negative.   Hematological: Negative.   Psychiatric/Behavioral: Negative.        Objective:   Physical Exam  Constitutional: She is oriented to person, place, and time. She appears well-developed and well-nourished.  HENT:  Head: Normocephalic and atraumatic.  Eyes: Conjunctivae and EOM are normal. Pupils are equal, round, and reactive to light.  Neck: Normal range of motion. Neck supple.  Cardiovascular: Normal rate, regular rhythm and normal heart sounds.   Pulmonary/Chest: Effort normal and breath sounds normal.  There is palpable nodularity along her left axilla and left chest wall consistent with the unresectable disease that we know she has  Abdominal: Soft. Bowel sounds are normal.  Musculoskeletal:  She has significant swelling of both upper extremities  Neurological: She is alert and oriented to person, place, and time.  Skin: Skin is warm and dry.  Psychiatric: She has a normal mood and affect. Her behavior is normal.       Assessment:     The patient is 3 months status post left modified radical mastectomy for metastatic breast cancer     Plan:     At this point I think she is doing poorly. It  is difficult to tell if she is feeling this way secondary to the chemotherapy or from the cancer. She will continue to followup with treatment by her medical oncologist. I will plan to see her back in 6 months

## 2013-08-13 NOTE — Patient Instructions (Signed)
Continue chemo and rehab

## 2013-08-14 ENCOUNTER — Ambulatory Visit: Payer: PRIVATE HEALTH INSURANCE

## 2013-08-14 ENCOUNTER — Ambulatory Visit (HOSPITAL_BASED_OUTPATIENT_CLINIC_OR_DEPARTMENT_OTHER): Payer: PRIVATE HEALTH INSURANCE

## 2013-08-14 ENCOUNTER — Other Ambulatory Visit (HOSPITAL_BASED_OUTPATIENT_CLINIC_OR_DEPARTMENT_OTHER): Payer: PRIVATE HEALTH INSURANCE

## 2013-08-14 ENCOUNTER — Telehealth: Payer: Self-pay | Admitting: Oncology

## 2013-08-14 ENCOUNTER — Telehealth: Payer: Self-pay | Admitting: *Deleted

## 2013-08-14 ENCOUNTER — Other Ambulatory Visit: Payer: Self-pay | Admitting: Oncology

## 2013-08-14 ENCOUNTER — Ambulatory Visit (HOSPITAL_BASED_OUTPATIENT_CLINIC_OR_DEPARTMENT_OTHER): Payer: PRIVATE HEALTH INSURANCE | Admitting: Oncology

## 2013-08-14 VITALS — BP 142/82 | HR 80 | Temp 98.7°F | Resp 18 | Ht 64.0 in | Wt 230.1 lb

## 2013-08-14 DIAGNOSIS — C50411 Malignant neoplasm of upper-outer quadrant of right female breast: Secondary | ICD-10-CM

## 2013-08-14 DIAGNOSIS — I890 Lymphedema, not elsewhere classified: Secondary | ICD-10-CM

## 2013-08-14 DIAGNOSIS — J910 Malignant pleural effusion: Secondary | ICD-10-CM

## 2013-08-14 DIAGNOSIS — C7951 Secondary malignant neoplasm of bone: Secondary | ICD-10-CM

## 2013-08-14 DIAGNOSIS — C50919 Malignant neoplasm of unspecified site of unspecified female breast: Secondary | ICD-10-CM

## 2013-08-14 DIAGNOSIS — C50412 Malignant neoplasm of upper-outer quadrant of left female breast: Secondary | ICD-10-CM

## 2013-08-14 DIAGNOSIS — C773 Secondary and unspecified malignant neoplasm of axilla and upper limb lymph nodes: Secondary | ICD-10-CM

## 2013-08-14 DIAGNOSIS — C50619 Malignant neoplasm of axillary tail of unspecified female breast: Secondary | ICD-10-CM

## 2013-08-14 DIAGNOSIS — Z5111 Encounter for antineoplastic chemotherapy: Secondary | ICD-10-CM

## 2013-08-14 DIAGNOSIS — Z170 Estrogen receptor positive status [ER+]: Secondary | ICD-10-CM

## 2013-08-14 LAB — CBC WITH DIFFERENTIAL/PLATELET
BASO%: 1 % (ref 0.0–2.0)
EOS%: 1.8 % (ref 0.0–7.0)
Eosinophils Absolute: 0 10*3/uL (ref 0.0–0.5)
HCT: 30.2 % — ABNORMAL LOW (ref 34.8–46.6)
LYMPH%: 19.1 % (ref 14.0–49.7)
MCHC: 32.7 g/dL (ref 31.5–36.0)
MCV: 90.6 fL (ref 79.5–101.0)
MONO#: 0.2 10*3/uL (ref 0.1–0.9)
MONO%: 9.4 % (ref 0.0–14.0)
NEUT#: 1.2 10*3/uL — ABNORMAL LOW (ref 1.5–6.5)
NEUT%: 68.7 % (ref 38.4–76.8)
Platelets: 161 10*3/uL (ref 145–400)
WBC: 1.7 10*3/uL — ABNORMAL LOW (ref 3.9–10.3)
lymph#: 0.3 10*3/uL — ABNORMAL LOW (ref 0.9–3.3)

## 2013-08-14 LAB — COMPREHENSIVE METABOLIC PANEL (CC13)
ALT: 14 U/L (ref 0–55)
Anion Gap: 5 mEq/L (ref 3–11)
BUN: 23 mg/dL (ref 7.0–26.0)
CO2: 34 mEq/L — ABNORMAL HIGH (ref 22–29)
Calcium: 9.1 mg/dL (ref 8.4–10.4)
Chloride: 94 mEq/L — ABNORMAL LOW (ref 98–109)
Creatinine: 1.3 mg/dL — ABNORMAL HIGH (ref 0.6–1.1)
Glucose: 120 mg/dl (ref 70–140)
Total Bilirubin: 0.36 mg/dL (ref 0.20–1.20)

## 2013-08-14 LAB — HOLD TUBE, BLOOD BANK

## 2013-08-14 MED ORDER — HEPARIN SOD (PORK) LOCK FLUSH 100 UNIT/ML IV SOLN
500.0000 [IU] | Freq: Once | INTRAVENOUS | Status: AC | PRN
  Administered 2013-08-14: 500 [IU]
  Filled 2013-08-14: qty 5

## 2013-08-14 MED ORDER — SODIUM CHLORIDE 0.9 % IJ SOLN
10.0000 mL | INTRAMUSCULAR | Status: DC | PRN
  Administered 2013-08-14: 10 mL
  Filled 2013-08-14: qty 10

## 2013-08-14 MED ORDER — ONDANSETRON 8 MG/50ML IVPB (CHCC)
8.0000 mg | Freq: Once | INTRAVENOUS | Status: AC
  Administered 2013-08-14: 8 mg via INTRAVENOUS

## 2013-08-14 MED ORDER — SODIUM CHLORIDE 0.9 % IV SOLN
Freq: Once | INTRAVENOUS | Status: AC
  Administered 2013-08-14: 15:00:00 via INTRAVENOUS

## 2013-08-14 MED ORDER — ONDANSETRON 8 MG/NS 50 ML IVPB
INTRAVENOUS | Status: AC
  Filled 2013-08-14: qty 8

## 2013-08-14 MED ORDER — PACLITAXEL PROTEIN-BOUND CHEMO INJECTION 100 MG
100.0000 mg/m2 | Freq: Once | INTRAVENOUS | Status: AC
  Administered 2013-08-14: 225 mg via INTRAVENOUS
  Filled 2013-08-14: qty 45

## 2013-08-14 MED ORDER — SODIUM CHLORIDE 0.9 % IV SOLN: Freq: Once | INTRAVENOUS | Status: DC

## 2013-08-14 MED ORDER — DEXAMETHASONE SODIUM PHOSPHATE 10 MG/ML IJ SOLN
10.0000 mg | Freq: Once | INTRAMUSCULAR | Status: AC
  Administered 2013-08-14: 10 mg via INTRAVENOUS

## 2013-08-14 MED ORDER — DEXAMETHASONE SODIUM PHOSPHATE 10 MG/ML IJ SOLN
INTRAMUSCULAR | Status: AC
  Filled 2013-08-14: qty 1

## 2013-08-14 NOTE — Telephone Encounter (Signed)
, °

## 2013-08-14 NOTE — Progress Notes (Signed)
ID: Jeanette Morris   DOB: 08-Jul-1946  MR#: [id number]  CSN#:[id number]  PCP: Egbert Garibaldi, NP GYN:  SUFelicity Pellegrini OTHER MD: Arvilla Meres, Antony Blackbird, Rexanne Mano  CHIEF COMPLAINT:  Metastatic Breast Cancer   HISTORY OF PRESENT ILLNESS: The patient undergoes annual screening mammography.  Her mammograms previously had fullness.  In August 2011 she presented with some firmness of her right breast. She also noted some skin thickening around her nipple and some tenderness.  She was subsequently seen for this on 05/13/2010 and was referred for bilateral mammogram and right breast ultrasound.  There was concern that this may be mastitis.  There were diffuse skin thickening, tissue edema, enlarged lymph node in right axilla, some subareolar distortion secondary to the previous surgery.  She was given a course of doxycycline for 10 days and asked to return. A follow-up ultrasound of the breast showed persistent abnormalities.  She was referred for biopsy.  She underwent biopsy of the right axilla on 05/27/2010 and it showed metastatic carcinoma consistent with breast primary.  This was ER and PR positive, HER-2 was positive with a ratio of 3.09.  The ER ratio is 34%, PR was 64%, proliferative index 60%.  She subsequently had a MRI scan of both breasts on 06/03/2010 and this showed a area of minimal enhancement in the right breast measuring 10.0 x 6.0 x 3.0 cm.  Enlarged lymph nodes were seen in the right axilla.  Largest node measuring 3.0 x 1.0 x 7.0 cm.  Intramammary lymph nodes were also seen in the outer left breast, the largest measuring 2.8 x 2.5 cm.  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   The patient's subsequent history is as detailed below.  INTERVAL HISTORY: Jeanette Morris returns today accompanied by her husband [name] for followup of her metastatic breast cancer. Today is day 8 cycle 1 of her first day 1 and day 8 q. 21 day  Abraxane cycle--the Abraxane has been resumed because of disease progression on anti-estrogens. Montgomery received her first dose in the hospital. She is now staying at [facility], where she is receiving rehabilitation.  REVIEW OF SYSTEMS: Jeanette Morris likes the SNF where she is staying, and she has a private room (this is necessary because she is cytopenic). Her pain is moderately controlled, but she is not calling for pain medicine until the pain is severe and I have encouraged her to call when the pain which is a 4 5/10. She also has had some nausea and has vomited twice since her hospital discharge. What is bothering her the most though is that her arms, which have significant lymphedema, are not being wrapped. Otherwise she is having no diarrhea or constipation, has pretty good bladder control, has had no fever, rash or bleeding, and says "I feel fine". She does have some shoulder discomfort right greater than left. Peripheral neuropathy has not changed. A detailed review of systems today was otherwise noncontributory  PAST MEDICAL HISTORY: Past Medical History  Diagnosis Date  . Hypertension   . Hearing loss     right ear  . Hyperlipidemia   . Arthritis   . Dysrhythmia     A fib with IV chemo treatments  . Shortness of breath     with exertion  . Tuberculosis     years ago-had 1 year tx  . 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  Breast cancer     (Rt) breast ca dx 9/11  . 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 Neuropathy   . History of radiation therapy 01/18/11-03/07/11    5940 cGy to Right chest wall, axilla and supraclavicular region    PAST SURGICAL HISTORY: Past Surgical History  Procedure Laterality Date  . Tubal ligation    . Portacath placement Left   . Abdominal hysterectomy    . Lung biopsy    . Lesion excision      L anterior thigh  . Breast surgery  12/01/10    ER+,PR+,Ki 67 64%,Her2 -; right breast lumpectomy  . Chest tube insertion  08/08/2012    Procedure:  INSERTION PLEURAL DRAINAGE CATHETER;  Surgeon: Alleen Borne, MD;  Location: [facility] OR;  Service: Thoracic;  Laterality: Right;  . Removal of pleural drainage catheter Right 10/10/2012    Procedure: REMOVAL OF PLEURAL DRAINAGE CATHETER;  Surgeon: Alleen Borne, MD;  Location: [facility] OR;  Service: Thoracic;  Laterality: Right;  . Mastectomy, radical Left 05/08/2013    Dr Carolynne Edouard  . Mastectomy modified radical Left 05/08/2013    Procedure: MASTECTOMY MODIFIED RADICAL;  Surgeon: Robyne Askew, MD;  Location: [facility] OR;  Service: General;  Laterality: Left;    FAMILY HISTORY No family history on file. The patient's father died in an automobile accident at age 71. The patient's mother died at age 33 with Alzheimer's disease. The patient had 8 brothers and 4 sisters. There is no history of breast or ovarian cancer in the family.  GYNECOLOGIC HISTORY: Menarche age 57, first live birth age 3. She is GX P7. She underwent menopause "more than 20 years ago". She never took hormone replacement.  SOCIAL HISTORY:  (Updated November 2014) She has been mostly a housewife. Her husband [name] used to be a cook at [employer]. He completed radiation therapy for prostate cancer under [facility] April of 2014. The patient's children are from an earlier marriage, and include Jeanette Morris, Jeanette Morris, [address], [address], Jeanette Morris, [place], and [employer]. The patient has 15 grandchildren and 1 great-grandchild. She attends [employer]  ADVANCED DIRECTIVES: Not in place  HEALTH MAINTENANCE: (Updated 07/04/2013) History  Substance Use Topics  . Smoking status: Former Smoker    Quit date: 06/30/1973  . Smokeless tobacco: Never Used  . Alcohol Use: No     Colonoscopy:  Not on file  PAP: Not on file  Bone density:  August 2012, Normal  Lipid panel: Not on file   No Known Allergies  Current Outpatient Prescriptions  Medication Sig Dispense Refill  . acetaminophen  (TYLENOL) 325 MG tablet Take 2 tablets (650 mg total) by mouth every 6 (six) hours as needed for mild pain (or Fever >/= 101).  100 tablet  12  . Calcium Carbonate-Vitamin D 600-400 MG-UNIT per tablet Take 1 tablet by mouth every morning.       . carvedilol (COREG) 6.25 MG tablet Take 1 tablet (6.25 mg total) by mouth 2 (two) times daily with a meal. She takes with a 12.5 mg tablet to equal her dose of 18.75 mg.  60 tablet  6  . cyanocobalamin 500 MCG tablet Take 500 mcg by mouth daily with breakfast.       . ferrous sulfate 325 (65 FE) MG tablet Take 1 tablet (325 mg total) by mouth daily with breakfast.  30 tablet  1  . furosemide (LASIX) 20 MG tablet Take 1 tablet (20  mg total) by mouth daily.  3 tablet  0  . gabapentin (NEURONTIN) 300 MG capsule Take 300 mg by mouth at bedtime.       . ondansetron (ZOFRAN) 8 MG tablet Take 1 tablet (8 mg total) by mouth every 8 (eight) hours as needed for nausea or vomiting. Take two times a day starting the day after chemo for 2 days. Then take two times a day as needed for nausea or vomiting.  30 tablet  1  . oxyCODONE (OXY IR/ROXICODONE) 5 MG immediate release tablet Take 1 tablet (5 mg total) by mouth every 4 (four) hours as needed for severe pain.  30 tablet  0  . OxyCODONE (OXYCONTIN) 20 mg T12A 12 hr tablet Take 1 tablet (20 mg total) by mouth every 12 (twelve) hours.  60 tablet  0  . polyethylene glycol (MIRALAX / GLYCOLAX) packet Take 17 g by mouth daily.  14 each  0  . prochlorperazine (COMPAZINE) 10 MG tablet Take 1 tablet (10 mg total) by mouth every 6 (six) hours as needed (Nausea or vomiting).  30 tablet  1  . senna (SENOKOT) 8.6 MG TABS tablet Take 1 tablet (8.6 mg total) by mouth daily as needed for mild constipation.  120 each  0  . tamoxifen (NOLVADEX) 20 MG tablet        No current facility-administered medications for this visit.   Facility-Administered Medications Ordered in Other Visits  Medication Dose Route Frequency Provider Last Rate  Last Dose  . dexamethasone (DECADRON) injection 10 mg  10 mg Intravenous Once Lowella Dell, MD      . heparin lock flush 100 unit/mL  500 Units Intracatheter Once PRN Lowella Dell, MD      . ondansetron (ZOFRAN) IVPB 8 mg  8 mg Intravenous Once Lowella Dell, MD      . PACLitaxel-protein bound (ABRAXANE) chemo infusion 225 mg  100 mg/m2 (Treatment Plan Actual) Intravenous Once Lowella Dell, MD      . sodium chloride 0.9 % injection 10 mL  10 mL Intracatheter PRN Lowella Dell, MD        OBJECTIVE:  African American woman examined in a wheelchair  Filed Vitals:   08/14/13 1243  BP: 142/82  Pulse: 80  Temp: 98.7 F (37.1 C)  Resp: 18     Body mass index is 39.48 kg/(m^2).    ECOG FS: 2  Filed Weights   08/14/13 1243  Weight: 230 lb 1.6 oz (104.373 kg)   Sclerae unicteric, pupils equal and reactive Oropharynx clear and moist-- no thrush No cervical or supraclavicular adenopathy Lungs no rales or rhonchi, poor excursion bilaterally Heart regular rate and rhythm Abd soft, obese, nontender, positive bowel sounds MSK no focal spinal tenderness, bilateral upper extremity lymphedema, grade 3 Neuro: nonfocal, well oriented, appropriate affect Breasts: Deferred    LAB RESULTS: Lab Results  Component Value Date   WBC 1.7* 08/14/2013   NEUTROABS 1.2* 08/14/2013   HGB 9.9* 08/14/2013   HCT 30.2* 08/14/2013   MCV 90.6 08/14/2013   PLT 161 08/14/2013      Chemistry      Component Value Date/Time   NA 132* 08/14/2013 1152   NA 129* 08/12/2013 0600   K 5.1 08/14/2013 1152   K 5.6* 08/12/2013 0600   CL 93* 08/12/2013 0600   CL 106 02/14/2013 1048   CO2 34* 08/14/2013 1152   CO2 33* 08/12/2013 0600   BUN 23.0 08/14/2013 1152  BUN 29* 08/12/2013 0600   CREATININE 1.3* 08/14/2013 1152   CREATININE 1.34* 08/12/2013 0600      Component Value Date/Time   CALCIUM 9.1 08/14/2013 1152   CALCIUM 8.5 08/12/2013 0600   ALKPHOS 52 08/14/2013 1152   ALKPHOS 57  08/12/2013 0600   AST 30 08/14/2013 1152   AST 42* 08/12/2013 0600   ALT 14 08/14/2013 1152   ALT 19 08/12/2013 0600   BILITOT 0.36 08/14/2013 1152   BILITOT 0.5 08/12/2013 0600       Lab Results  Component Value Date   LABCA2 95* 08/14/2012    STUDIES: Ct Chest W Contrast  07/02/2013   CLINICAL DATA:  Restaging bilateral breast cancer diagnosed in 2011. Radiation therapy completed. Chemotherapy ongoing. Patient reports shortness of breath.  EXAM: CT CHEST WITH CONTRAST  TECHNIQUE: Multidetector CT imaging of the chest was performed during intravenous contrast administration.  CONTRAST:  80mL OMNIPAQUE IOHEXOL 300 MG/ML  SOLN  COMPARISON:  PET-CT today and chest CT 12/18/2012.  FINDINGS: Compared with the prior study, there is new diffuse subcutaneous edema throughout the upper chest and visualized upper arms. There is new ill-defined nodularity in the left supraclavicular area, hypermetabolic on PET and worrisome for lymphadenopathy. A representative lesion measures 1.9 cm on image 1. There is also new nodularity anterior to the left pectoralis major muscle, measuring 1.7 cm on image 10, also hypermetabolic on PET. There is asymmetric nodular enlargement of the left pectoralis major muscle more inferiorly without well defined mass. Ill-defined increased nodal tissue is present within the left axilla. No right axillary masses are identified following lymph node dissection.  Asymmetric heterogeneous enlargement of the left thyroid lobe and resulting tracheal deviation to the right are stable, consistent with goiter. No enlarged mediastinal or hilar lymph nodes are identified. There is diffuse atherosclerosis of the aorta and coronary arteries.  There are enlarging moderate right greater than left pleural effusions with associated bibasilar atelectasis. No pulmonary nodules, endobronchial lesions or confluent airspace opacities are identified. There is minimal ground-glass opacity at the right apex  measuring 10 mm on image 11.  Images through the upper abdomen again demonstrate extensive calcification throughout the gallbladder wall. No focal liver lesions are identified.  There is a lytic metastasis involving the left aspect of the T11 vertebral body. There are suspected metastases involving the upper right ribs  IMPRESSION: 1. Diffuse soft tissue edema throughout the upper chest and bilateral arms, likely in part secondary to radiation therapy. Superimposed lymphedema is possible. 2. Increasing ill-defined nodularity is noted in the left supraclavicular, left axillary and left anterior chest wall regions worrisome for metastatic disease given the associated hypermetabolic activity on today's PET-CT. 3. No mediastinal adenopathy or pulmonary parenchymal metastases identified. 4. New bilateral pleural effusions and associated bibasilar atelectasis. The right effusion is potentially malignant as correlated with today's PET-CT. 5. Osseous metastases involving the upper right ribs and T11 vertebral body. 6. Grossly stable goiter and porcelain gallbladder.   Electronically Signed   By: Roxy Horseman M.D.   On: 07/02/2013 15:57   Nm Pet Image Restag (ps) Skull Base To Thigh  07/02/2013   CLINICAL DATA:  Subsequent treatment strategy for breast cancer. Restaging bilateral breast cancer diagnosed in 2011. Radiation therapy completed. Chemotherapy ongoing. Patient reports shortness of breath.  EXAM: NUCLEAR MEDICINE PET SKULL BASE TO THIGH  FASTING BLOOD GLUCOSE:  Value: 104mg /dl  TECHNIQUE: 96.0 mCi A-54 FDG was injected intravenously. CT data was obtained and used for attenuation correction and  anatomic localization only. (This was not acquired as a diagnostic CT examination.) Additional exam technical data entered on technologist worksheet.  COMPARISON:  Chest CT performed today and PET-CT 04/03/2012.  FINDINGS: The examination is mildly limited by body habitus.  NECK  There are multiple new ill-defined  hypermetabolic left cervical lymph nodes, confluent in the left supraclavicular area. SUV max is up to 12.6. There are smaller hypermetabolic supraclavicular lymph nodes on the right. Asymmetric goiter is again noted without significantly increased metabolic activity or significant change.  CHEST  There is diffusely increased hypermetabolic activity in the left axilla corresponding with increased soft tissue density between the surgical clips worrisome for confluent lymphadenopathy. There is also hypermetabolic activity within the left pectoralis muscle which demonstrates ill-defined nodular enlargement. There are several subcutaneous nodules within the left anterior upper chest which are new and hypermetabolic. Hypermetabolic soft tissue activity in the upper right arm is without definite corresponding nodal disease and may reflect myositis or physiologic muscular activity.  No discrete hypermetabolic mediastinal or hilar lymph nodes are identified. There is a new right pleural effusion with asymmetric hypermetabolic activity at the right apex, concerning for a malignant effusion. Some of this is likely due to underlying osseous metastatic disease in the ribs and upper thoracic spine.  ABDOMEN/PELVIS  Hepatic evaluation is limited by body habitus. No discrete hepatic metastases are identified. There is no evidence metastatic disease to the adrenal glands, spleen or pancreas. 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 There is ill-defined edema within the base of the mesentery with associated hypermetabolic activity. There is a hypermetabolic left external iliac node measuring 1.3 cm on image 179 (SUV max 9.4). No discrete enlarged abdominal lymph nodes or peritoneal nodules identified.  SKELETON  There is multifocal osseous activity consistent with metastatic disease. Asymmetric activity is present at the right lung apex, likely involving the upper right ribs and upper thoracic spine. There is a lytic hypermetabolic  lesion involving the left aspect of the T11 vertebral body. Additional metastases are present within the lumbar spine and bony pelvis.  IMPRESSION: 1. Findings are consistent with extensive upper left chest wall recurrence with multiple hypermetabolic lymph nodes in the neck and left axilla. There is probable involvement of the left pectoralis musculature. 2. Multifocal osseous metastases involving the ribs, spine and pelvis. Spinal involvement is limited by body habitus. If the patient has focal neurologic symptoms, further evaluation with MRI may be helpful. 3. Probable malignant right pleural effusion. 4. Atypical central mesenteric hypermetabolic activity without obvious associated nodal mass. This could be due to superimposed pancreatitis.   Electronically Signed   By: Roxy Horseman M.D.   On: 07/02/2013 15:54    ASSESSMENT: 67 y.o. [place] woman with stage 4 breast cancer  (1) status post RIGHT axillary lymph node biopsy 05/27/2010 for a clinical T4 N1, stage IIIB (inflammatory) invasive ductal carcinoma, grade not stated, estrogen receptor 94% positive, progesterone receptor 64% positive, with an MIB-1 of 60%, and HER-2 amplification by FISH with a ratio of 7.5.  (2) neoadjuvant chemotherapy consisted of 6 cycles of standard carboplatin, docetaxel, trastuzumab, with the trastuzumab held cycles 5 and 6 due to a drop in her borderline ejection fraction (from 45-50% at baseline to 40-45% January 2012)-- total trastuzumab treatment = 2 months  (3) status post right mastectomy and axillary lymph node dissection 12/01/2010, showing no residual invasive carcinoma in the breast, but residual tumor in 20 of 21 axillary lymph nodes. (Only one of the 20 positive lymph nodes showed a  significant residual tumor, measuring 5 mm; the remaining lymph nodes, including the evidence of extracapsular extension, showed single and clusters a few neoplastic cells with extensive therapy related changes).  (4)  postmastectomy radiation to the right chest wall, axilla, and supraclavicular region completed 03/07/2011  (5) additional 2 months of trastuzumab given February through April 2013, again discontinued because of a drop in the ejection fraction and lateral S'  (6) on letrozole July 2012 to September 2013  (7) LEFT axillary adenopathy noted by Dr. Carolynne Edouard on exam July 2013, with biopsy of a left axillary node 04/17/2012 showing an invasive ductal carcinoma, estrogen receptor 91% positive, progesterone receptor 9% positive, with an MIB-1 of 45%, and no HER-2 amplification.   (8) PET scan 04/03/2012 showed multiple enlarged left axillary and small left supraclavicular lymph nodes that are moderately hypermetabolic, with some hypermetabolic activity within the left axillary tail. No other hypermetabolic lymph nodes identified. There was no involvement of the right axilla, mediastinum or right chest wall and no extrathoracic metastases were demonstrated.  (8) on exemestane and everolimus September through December 2013  (9) s/p RIGHT thoracentesis 08/01/2012, cytologically positive; s/p temporary Pleurx; effusion resolved after Abraxane therapy  (10) Abraxane started 08/16/2012, given day 1 and day 8 of each 21 day cycle, completed 6 cycles (12 doses) 12/06/2012, with excellent response  (11) fulvestrant started 12/19/2012, stopped 04/11/2013 with progression  (12) trastuzumab resumed 12/21/2011. We scheduled the doses 4 weeks apart to see if they caould be better tolerated. However echo 02/04/2013 again showed a drop in the EF and S', so Herceptin stopped (last dose 01/17/2013). Most recent echo, 04/18/2013, shows the ejection fraction to have recovered.  (13) status post left modified radical mastectomy 05/08/2013 for a pT4, pN2a [tumor extended over 15 cm, all 6 sampled lymph nodes were involved) invasive lobular breast cancer, grade 3, which was HER-2 negative. It was estrogen receptor positive at 72%,  progesterone receptor negative. Posterior margin was broadly positive  (14) tamoxifen started 05/29/2013, discontinued December 2014 with progression  (15) post-mastectomy irradiation to left chest wall and left Bartlett fossa planned, beginning 07/09/2013  (16) new baseline staging scans including a chest CT and PET scan were obtained 07/02/2013 suggesting progression of disease, but that is when compared to previous scans in August 2013. However in December of 2014 there was obvious disease progression on the chest wall  (17) Abraxane resumed 08/07/2013, the plan being for treatments day 1 and day 8 of every 21 day cycle, with restaging after 2 full cycles.  PLAN:  I asked a number physical therapists to redo Georgeanne Nim is lymphedema wrapping today. This needs to be done at her SNF and if it cannot be done there she will need to be transported regularly to our physical rehabilitation facility. Lakiah also needs to return tomorrow for [employer].  I increased her pain medicine, wrote for ondansetron and Compazine to be given for the next 2 days, and then when necessary, wrote for the arm wrap orders, and place an order for the staff to call us if the patient's temperature which is under degrees or greater.  Santoria will return December 31 for day 1 cycle 2 of her Abraxane treatments. We are canceling the scan she had scheduled for January 6 and moving those back until she has completed at least 2 cycles of Abraxane. The skin lesions on the chest wall also can be directly examined for response.  Georgeanne Nim has a good understanding of this plan. She agrees with that.  She knows to call for any problems that may develop before her next visit here.  Lowella Dell, MD      08/14/2013

## 2013-08-14 NOTE — Patient Instructions (Signed)
[facility] Discharge Instructions for Patients Receiving Chemotherapy  Today you received the following chemotherapy agent; Abraxane.  To help prevent nausea and vomiting after your treatment, we encourage you to take your nausea medication.   If you develop nausea and vomiting that is not controlled by your nausea medication, call the clinic.   BELOW ARE SYMPTOMS THAT SHOULD BE REPORTED IMMEDIATELY:  *FEVER GREATER THAN 100.5 F  *CHILLS WITH OR WITHOUT FEVER  NAUSEA AND VOMITING THAT IS NOT CONTROLLED WITH YOUR NAUSEA MEDICATION  *UNUSUAL SHORTNESS OF BREATH  *UNUSUAL BRUISING OR BLEEDING  TENDERNESS IN MOUTH AND THROAT WITH OR WITHOUT PRESENCE OF ULCERS  *URINARY PROBLEMS  *BOWEL PROBLEMS  UNUSUAL RASH Items with * indicate a potential emergency and should be followed up as soon as possible.  Feel free to call the clinic you have any questions or concerns. The clinic phone number is [phone].    

## 2013-08-14 NOTE — Progress Notes (Signed)
1430 Per Dr Darnelle Catalan, ok to treat with current labs, anc @ 1.2.

## 2013-08-14 NOTE — Telephone Encounter (Signed)
Per staff message and POF I have scheduled appts.  JMW  

## 2013-08-15 ENCOUNTER — Ambulatory Visit: Payer: PRIVATE HEALTH INSURANCE

## 2013-08-15 ENCOUNTER — Ambulatory Visit (HOSPITAL_BASED_OUTPATIENT_CLINIC_OR_DEPARTMENT_OTHER): Payer: PRIVATE HEALTH INSURANCE

## 2013-08-15 VITALS — BP 190/99 | HR 78 | Temp 98.3°F

## 2013-08-15 DIAGNOSIS — Z5189 Encounter for other specified aftercare: Secondary | ICD-10-CM

## 2013-08-15 DIAGNOSIS — C50412 Malignant neoplasm of upper-outer quadrant of left female breast: Secondary | ICD-10-CM

## 2013-08-15 DIAGNOSIS — C50619 Malignant neoplasm of axillary tail of unspecified female breast: Secondary | ICD-10-CM

## 2013-08-15 DIAGNOSIS — C50411 Malignant neoplasm of upper-outer quadrant of right female breast: Secondary | ICD-10-CM

## 2013-08-15 DIAGNOSIS — C773 Secondary and unspecified malignant neoplasm of axilla and upper limb lymph nodes: Secondary | ICD-10-CM

## 2013-08-15 DIAGNOSIS — C7951 Secondary malignant neoplasm of bone: Secondary | ICD-10-CM

## 2013-08-15 MED ORDER — PEGFILGRASTIM INJECTION 6 MG/0.6ML
6.0000 mg | Freq: Once | SUBCUTANEOUS | Status: AC
  Administered 2013-08-15: 6 mg via SUBCUTANEOUS
  Filled 2013-08-15: qty 0.6

## 2013-08-16 ENCOUNTER — Ambulatory Visit: Payer: PRIVATE HEALTH INSURANCE

## 2013-08-16 ENCOUNTER — Other Ambulatory Visit: Payer: Self-pay | Admitting: Oncology

## 2013-08-19 ENCOUNTER — Ambulatory Visit: Payer: PRIVATE HEALTH INSURANCE

## 2013-08-19 ENCOUNTER — Ambulatory Visit: Payer: PRIVATE HEALTH INSURANCE | Admitting: Physical Therapy

## 2013-08-20 ENCOUNTER — Ambulatory Visit: Payer: PRIVATE HEALTH INSURANCE

## 2013-08-20 ENCOUNTER — Ambulatory Visit: Payer: PRIVATE HEALTH INSURANCE | Admitting: Radiation Oncology

## 2013-08-21 ENCOUNTER — Ambulatory Visit: Payer: PRIVATE HEALTH INSURANCE

## 2013-08-23 ENCOUNTER — Ambulatory Visit: Payer: PRIVATE HEALTH INSURANCE

## 2013-08-26 ENCOUNTER — Other Ambulatory Visit: Payer: Self-pay | Admitting: Oncology

## 2013-08-26 ENCOUNTER — Ambulatory Visit: Payer: PRIVATE HEALTH INSURANCE

## 2013-08-26 NOTE — Progress Notes (Unsigned)
ID: Jeanette Morris   DOB: 1946/08/25  MR#: [id number]  CSN#:[id number]  PCP: Egbert Garibaldi, NP GYN:  SUFelicity Pellegrini OTHER MD: Arvilla Meres, Antony Blackbird, Rexanne Mano  CHIEF COMPLAINT:  Metastatic Breast Cancer   HISTORY OF PRESENT ILLNESS: The patient undergoes annual screening mammography.  Her mammograms previously had fullness.  In August 2011 she presented with some firmness of her right breast. She also noted some skin thickening around her nipple and some tenderness.  She was subsequently seen for this on 05/13/2010 and was referred for bilateral mammogram and right breast ultrasound.  There was concern that this may be mastitis.  There were diffuse skin thickening, tissue edema, enlarged lymph node in right axilla, some subareolar distortion secondary to the previous surgery.  She was given a course of doxycycline for 10 days and asked to return. A follow-up ultrasound of the breast showed persistent abnormalities.  She was referred for biopsy.  She underwent biopsy of the right axilla on 05/27/2010 and it showed metastatic carcinoma consistent with breast primary.  This was ER and PR positive, HER-2 was positive with a ratio of 3.09.  The ER ratio is 34%, PR was 64%, proliferative index 60%.  She subsequently had a MRI scan of both breasts on 06/03/2010 and this showed a area of minimal enhancement in the right breast measuring 10.0 x 6.0 x 3.0 cm.  Enlarged lymph nodes were seen in the right axilla.  Largest node measuring 3.0 x 1.0 x 7.0 cm.  Intramammary lymph nodes were also seen in the outer left breast, the largest measuring 2.8 x 2.5 cm.  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   The patient's subsequent history is as detailed below.  INTERVAL HISTORY: Jeanette Morris returns today accompanied by her husband [name] for followup of her metastatic breast cancer. Today is day 8 cycle 1 of her first day 1 and day 8 q. 21 day  Abraxane cycle--the Abraxane has been resumed because of disease progression on anti-estrogens. Eirene received her first dose in the hospital. She is now staying at [facility], where she is receiving rehabilitation.  REVIEW OF SYSTEMS: [name] likes the SNF where she is staying, and she has a private room (this is necessary because she is cytopenic). Her pain is moderately controlled, but she is not calling for pain medicine until the pain is severe and I have encouraged her to call when the pain which is a 4 5/10. She also has had some nausea and has vomited twice since her hospital discharge. What is bothering her the most though is that her arms, which have significant lymphedema, are not being wrapped. Otherwise she is having no diarrhea or constipation, has pretty good bladder control, has had no fever, rash or bleeding, and says "I feel fine". She does have some shoulder discomfort right greater than left. Peripheral neuropathy has not changed. A detailed review of systems today was otherwise noncontributory  PAST MEDICAL HISTORY: Past Medical History  Diagnosis Date  . Hypertension   . Hearing loss     right ear  . Hyperlipidemia   . Arthritis   . Dysrhythmia     A fib with IV chemo treatments  . Shortness of breath     with exertion  . Tuberculosis     years ago-had 1 year tx  . 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  Breast cancer     (Rt) breast ca dx 9/11  . 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 Neuropathy   . History of radiation therapy 01/18/11-03/07/11    5940 cGy to Right chest wall, axilla and supraclavicular region    PAST SURGICAL HISTORY: Past Surgical History  Procedure Laterality Date  . Tubal ligation    . Portacath placement Left   . Abdominal hysterectomy    . Lung biopsy    . Lesion excision      L anterior thigh  . Breast surgery  12/01/10    ER+,PR+,Ki 67 64%,Her2 -; right breast lumpectomy  . Chest tube insertion  08/08/2012    Procedure:  INSERTION PLEURAL DRAINAGE CATHETER;  Surgeon: Alleen Borne, MD;  Location: [facility] OR;  Service: Thoracic;  Laterality: Right;  . Removal of pleural drainage catheter Right 10/10/2012    Procedure: REMOVAL OF PLEURAL DRAINAGE CATHETER;  Surgeon: Alleen Borne, MD;  Location: [facility] OR;  Service: Thoracic;  Laterality: Right;  . Mastectomy, radical Left 05/08/2013    Dr Carolynne Edouard  . Mastectomy modified radical Left 05/08/2013    Procedure: MASTECTOMY MODIFIED RADICAL;  Surgeon: Robyne Askew, MD;  Location: [facility] OR;  Service: General;  Laterality: Left;    FAMILY HISTORY No family history on file. The patient's father died in an automobile accident at age 48. The patient's mother died at age 25 with Alzheimer's disease. The patient had 8 brothers and 4 sisters. There is no history of breast or ovarian cancer in the family.  GYNECOLOGIC HISTORY: Menarche age 4, first live birth age 39. She is GX P7. She underwent menopause "more than 20 years ago". She never took hormone replacement.  SOCIAL HISTORY:  (Updated November 2014) She has been mostly a housewife. Her husband [name] used to be a cook at [employer]. He completed radiation therapy for prostate cancer under [facility] April of 2014. The patient's children are from an earlier marriage, and include Jeanette Morris, Jeanette Morris, [address], [address], Jeanette Morris, Jeanette Morris, and [employer]. The patient has 15 grandchildren and 1 great-grandchild. She attends [employer]  ADVANCED DIRECTIVES: Not in place  HEALTH MAINTENANCE: (Updated 07/04/2013) History  Substance Use Topics  . Smoking status: Former Smoker    Quit date: 06/30/1973  . Smokeless tobacco: Never Used  . Alcohol Use: No     Colonoscopy:  Not on file  PAP: Not on file  Bone density:  August 2012, Normal  Lipid panel: Not on file   No Known Allergies  Current Outpatient Prescriptions  Medication Sig Dispense Refill  . acetaminophen  (TYLENOL) 325 MG tablet Take 2 tablets (650 mg total) by mouth every 6 (six) hours as needed for mild pain (or Fever >/= 101).  100 tablet  12  . Calcium Carbonate-Vitamin D 600-400 MG-UNIT per tablet Take 1 tablet by mouth every morning.       . carvedilol (COREG) 6.25 MG tablet Take 1 tablet (6.25 mg total) by mouth 2 (two) times daily with a meal. She takes with a 12.5 mg tablet to equal her dose of 18.75 mg.  60 tablet  6  . cyanocobalamin 500 MCG tablet Take 500 mcg by mouth daily with breakfast.       . ferrous sulfate 325 (65 FE) MG tablet Take 1 tablet (325 mg total) by mouth daily with breakfast.  30 tablet  1  . furosemide (LASIX) 20 MG tablet Take 1 tablet (20  mg total) by mouth daily.  3 tablet  0  . gabapentin (NEURONTIN) 300 MG capsule Take 300 mg by mouth at bedtime.       . ondansetron (ZOFRAN) 8 MG tablet Take 1 tablet (8 mg total) by mouth every 8 (eight) hours as needed for nausea or vomiting. Take two times a day starting the day after chemo for 2 days. Then take two times a day as needed for nausea or vomiting.  30 tablet  1  . oxyCODONE (OXY IR/ROXICODONE) 5 MG immediate release tablet Take 1 tablet (5 mg total) by mouth every 4 (four) hours as needed for severe pain.  30 tablet  0  . OxyCODONE (OXYCONTIN) 20 mg T12A 12 hr tablet Take 1 tablet (20 mg total) by mouth every 12 (twelve) hours.  60 tablet  0  . polyethylene glycol (MIRALAX / GLYCOLAX) packet Take 17 g by mouth daily.  14 each  0  . prochlorperazine (COMPAZINE) 10 MG tablet Take 1 tablet (10 mg total) by mouth every 6 (six) hours as needed (Nausea or vomiting).  30 tablet  1  . senna (SENOKOT) 8.6 MG TABS tablet Take 1 tablet (8.6 mg total) by mouth daily as needed for mild constipation.  120 each  0  . tamoxifen (NOLVADEX) 20 MG tablet        No current facility-administered medications for this visit.    OBJECTIVE:  African American woman examined in a wheelchair  There were no vitals filed for this visit.    There is no weight on file to calculate BMI.    ECOG FS: 2  There were no vitals filed for this visit. Sclerae unicteric, pupils equal and reactive Oropharynx clear and moist-- no thrush No cervical or supraclavicular adenopathy Lungs no rales or rhonchi, poor excursion bilaterally Heart regular rate and rhythm Abd soft, obese, nontender, positive bowel sounds MSK no focal spinal tenderness, bilateral upper extremity lymphedema, grade 3 Neuro: nonfocal, well oriented, appropriate affect Breasts: Deferred    LAB RESULTS: Lab Results  Component Value Date   WBC 1.7* 08/14/2013   NEUTROABS 1.2* 08/14/2013   HGB 9.9* 08/14/2013   HCT 30.2* 08/14/2013   MCV 90.6 08/14/2013   PLT 161 08/14/2013      Chemistry      Component Value Date/Time   NA 132* 08/14/2013 1152   NA 129* 08/12/2013 0600   K 5.1 08/14/2013 1152   K 5.6* 08/12/2013 0600   CL 93* 08/12/2013 0600   CL 106 02/14/2013 1048   CO2 34* 08/14/2013 1152   CO2 33* 08/12/2013 0600   BUN 23.0 08/14/2013 1152   BUN 29* 08/12/2013 0600   CREATININE 1.3* 08/14/2013 1152   CREATININE 1.34* 08/12/2013 0600      Component Value Date/Time   CALCIUM 9.1 08/14/2013 1152   CALCIUM 8.5 08/12/2013 0600   ALKPHOS 52 08/14/2013 1152   ALKPHOS 57 08/12/2013 0600   AST 30 08/14/2013 1152   AST 42* 08/12/2013 0600   ALT 14 08/14/2013 1152   ALT 19 08/12/2013 0600   BILITOT 0.36 08/14/2013 1152   BILITOT 0.5 08/12/2013 0600       Lab Results  Component Value Date   LABCA2 95* 08/14/2012    STUDIES: Ct Chest W Contrast  07/02/2013   CLINICAL DATA:  Restaging bilateral breast cancer diagnosed in 2011. Radiation therapy completed. Chemotherapy ongoing. Patient reports shortness of breath.  EXAM: CT CHEST WITH CONTRAST  TECHNIQUE: Multidetector CT imaging of the  chest was performed during intravenous contrast administration.  CONTRAST:  80mL OMNIPAQUE IOHEXOL 300 MG/ML  SOLN  COMPARISON:  PET-CT today and chest CT  12/18/2012.  FINDINGS: Compared with the prior study, there is new diffuse subcutaneous edema throughout the upper chest and visualized upper arms. There is new ill-defined nodularity in the left supraclavicular area, hypermetabolic on PET and worrisome for lymphadenopathy. A representative lesion measures 1.9 cm on image 1. There is also new nodularity anterior to the left pectoralis major muscle, measuring 1.7 cm on image 10, also hypermetabolic on PET. There is asymmetric nodular enlargement of the left pectoralis major muscle more inferiorly without well defined mass. Ill-defined increased nodal tissue is present within the left axilla. No right axillary masses are identified following lymph node dissection.  Asymmetric heterogeneous enlargement of the left thyroid lobe and resulting tracheal deviation to the right are stable, consistent with goiter. No enlarged mediastinal or hilar lymph nodes are identified. There is diffuse atherosclerosis of the aorta and coronary arteries.  There are enlarging moderate right greater than left pleural effusions with associated bibasilar atelectasis. No pulmonary nodules, endobronchial lesions or confluent airspace opacities are identified. There is minimal ground-glass opacity at the right apex measuring 10 mm on image 11.  Images through the upper abdomen again demonstrate extensive calcification throughout the gallbladder wall. No focal liver lesions are identified.  There is a lytic metastasis involving the left aspect of the T11 vertebral body. There are suspected metastases involving the upper right ribs  IMPRESSION: 1. Diffuse soft tissue edema throughout the upper chest and bilateral arms, likely in part secondary to radiation therapy. Superimposed lymphedema is possible. 2. Increasing ill-defined nodularity is noted in the left supraclavicular, left axillary and left anterior chest wall regions worrisome for metastatic disease given the associated hypermetabolic  activity on today's PET-CT. 3. No mediastinal adenopathy or pulmonary parenchymal metastases identified. 4. New bilateral pleural effusions and associated bibasilar atelectasis. The right effusion is potentially malignant as correlated with today's PET-CT. 5. Osseous metastases involving the upper right ribs and T11 vertebral body. 6. Grossly stable goiter and porcelain gallbladder.   Electronically Signed   By: Roxy Horseman M.D.   On: 07/02/2013 15:57   Nm Pet Image Restag (ps) Skull Base To Thigh  07/02/2013   CLINICAL DATA:  Subsequent treatment strategy for breast cancer. Restaging bilateral breast cancer diagnosed in 2011. Radiation therapy completed. Chemotherapy ongoing. Patient reports shortness of breath.  EXAM: NUCLEAR MEDICINE PET SKULL BASE TO THIGH  FASTING BLOOD GLUCOSE:  Value: 104mg /dl  TECHNIQUE: 16.1 mCi W-96 FDG was injected intravenously. CT data was obtained and used for attenuation correction and anatomic localization only. (This was not acquired as a diagnostic CT examination.) Additional exam technical data entered on technologist worksheet.  COMPARISON:  Chest CT performed today and PET-CT 04/03/2012.  FINDINGS: The examination is mildly limited by body habitus.  NECK  There are multiple new ill-defined hypermetabolic left cervical lymph nodes, confluent in the left supraclavicular area. SUV max is up to 12.6. There are smaller hypermetabolic supraclavicular lymph nodes on the right. Asymmetric goiter is again noted without significantly increased metabolic activity or significant change.  CHEST  There is diffusely increased hypermetabolic activity in the left axilla corresponding with increased soft tissue density between the surgical clips worrisome for confluent lymphadenopathy. There is also hypermetabolic activity within the left pectoralis muscle which demonstrates ill-defined nodular enlargement. There are several subcutaneous nodules within the left anterior upper chest which are  new and  hypermetabolic. Hypermetabolic soft tissue activity in the upper right arm is without definite corresponding nodal disease and may reflect myositis or physiologic muscular activity.  No discrete hypermetabolic mediastinal or hilar lymph nodes are identified. There is a new right pleural effusion with asymmetric hypermetabolic activity at the right apex, concerning for a malignant effusion. Some of this is likely due to underlying osseous metastatic disease in the ribs and upper thoracic spine.  ABDOMEN/PELVIS  Hepatic evaluation is limited by body habitus. No discrete hepatic metastases are identified. There is no evidence metastatic disease to the adrenal glands, spleen or pancreas. 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 There is ill-defined edema within the base of the mesentery with associated hypermetabolic activity. There is a hypermetabolic left external iliac node measuring 1.3 cm on image 179 (SUV max 9.4). No discrete enlarged abdominal lymph nodes or peritoneal nodules identified.  SKELETON  There is multifocal osseous activity consistent with metastatic disease. Asymmetric activity is present at the right lung apex, likely involving the upper right ribs and upper thoracic spine. There is a lytic hypermetabolic lesion involving the left aspect of the T11 vertebral body. Additional metastases are present within the lumbar spine and bony pelvis.  IMPRESSION: 1. Findings are consistent with extensive upper left chest wall recurrence with multiple hypermetabolic lymph nodes in the neck and left axilla. There is probable involvement of the left pectoralis musculature. 2. Multifocal osseous metastases involving the ribs, spine and pelvis. Spinal involvement is limited by body habitus. If the patient has focal neurologic symptoms, further evaluation with MRI may be helpful. 3. Probable malignant right pleural effusion. 4. Atypical central mesenteric hypermetabolic activity without obvious associated nodal  mass. This could be due to superimposed pancreatitis.   Electronically Signed   By: Roxy Horseman M.D.   On: 07/02/2013 15:54    ASSESSMENT: 67 y.o. [place] woman with stage 4 breast cancer  (1) status post RIGHT axillary lymph node biopsy 05/27/2010 for a clinical T4 N1, stage IIIB (inflammatory) invasive ductal carcinoma, grade not stated, estrogen receptor 94% positive, progesterone receptor 64% positive, with an MIB-1 of 60%, and HER-2 amplification by FISH with a ratio of 7.5.  (2) neoadjuvant chemotherapy consisted of 6 cycles of standard carboplatin, docetaxel, trastuzumab, with the trastuzumab held cycles 5 and 6 due to a drop in her borderline ejection fraction (from 45-50% at baseline to 40-45% January 2012)-- total trastuzumab treatment = 2 months  (3) status post right mastectomy and axillary lymph node dissection 12/01/2010, showing no residual invasive carcinoma in the breast, but residual tumor in 20 of 21 axillary lymph nodes. (Only one of the 20 positive lymph nodes showed a significant residual tumor, measuring 5 mm; the remaining lymph nodes, including the evidence of extracapsular extension, showed single and clusters a few neoplastic cells with extensive therapy related changes).  (4) postmastectomy radiation to the right chest wall, axilla, and supraclavicular region completed 03/07/2011  (5) additional 2 months of trastuzumab given February through April 2013, again discontinued because of a drop in the ejection fraction and lateral S'  (6) on letrozole July 2012 to September 2013  (7) LEFT axillary adenopathy noted by Dr. Carolynne Edouard on exam July 2013, with biopsy of a left axillary node 04/17/2012 showing an invasive ductal carcinoma, estrogen receptor 91% positive, progesterone receptor 9% positive, with an MIB-1 of 45%, and no HER-2 amplification.   (8) PET scan 04/03/2012 showed multiple enlarged left axillary and small left supraclavicular lymph nodes that are moderately  hypermetabolic, with some hypermetabolic  activity within the left axillary tail. No other hypermetabolic lymph nodes identified. There was no involvement of the right axilla, mediastinum or right chest wall and no extrathoracic metastases were demonstrated.  (8) on exemestane and everolimus September through December 2013  (9) s/p RIGHT thoracentesis 08/01/2012, cytologically positive; s/p temporary Pleurx; effusion resolved after Abraxane therapy  (10) Abraxane started 08/16/2012, given day 1 and day 8 of each 21 day cycle, completed 6 cycles (12 doses) 12/06/2012, with excellent response  (11) fulvestrant started 12/19/2012, stopped 04/11/2013 with progression  (12) trastuzumab resumed 12/21/2011. We scheduled the doses 4 weeks apart to see if they caould be better tolerated. However echo 02/04/2013 again showed a drop in the EF and S', so Herceptin stopped (last dose 01/17/2013). Most recent echo, 04/18/2013, shows the ejection fraction to have recovered.  (13) status post left modified radical mastectomy 05/08/2013 for a pT4, pN2a [tumor extended over 15 cm, all 6 sampled lymph nodes were involved) invasive lobular breast cancer, grade 3, which was HER-2 negative. It was estrogen receptor positive at 72%, progesterone receptor negative. Posterior margin was broadly positive  (14) tamoxifen started 05/29/2013, discontinued December 2014 with progression  (15) post-mastectomy irradiation to left chest wall and left Gabbs fossa  November 19 through July 29, 2013;  10.8 gray in 6 fractions; discontinued because of poor functional status  (16) new baseline staging scans including a chest CT and PET scan were obtained 07/02/2013 suggesting progression of disease, but that is when compared to previous scans in August 2013. However in December of 2014 there was obvious disease progression on the chest wall  (17) Abraxane resumed 08/07/2013, the plan being for treatments day 1 and day 8 of every 21  day cycle, with restaging after 2 full cycles.  PLAN:  I asked a number physical therapists to redo Georgeanne Nim is lymphedema wrapping today. This needs to be done at her SNF and if it cannot be done there she will need to be transported regularly to our physical rehabilitation facility. Latasha also needs to return tomorrow for [employer].  I increased her pain medicine, wrote for ondansetron and Compazine to be given for the next 2 days, and then when necessary, wrote for the arm wrap orders, and place an order for the staff to call us if the patient's temperature which is under degrees or greater.  Jennilee will return December 31 for day 1 cycle 2 of her Abraxane treatments. We are canceling the scan she had scheduled for January 6 and moving those back until she has completed at least 2 cycles of Abraxane. The skin lesions on the chest wall also can be directly examined for response.  Georgeanne Nim has a good understanding of this plan. She agrees with that. She knows to call for any problems that may develop before her next visit here.  Lowella Dell, MD      08/26/2013

## 2013-08-27 ENCOUNTER — Ambulatory Visit: Payer: PRIVATE HEALTH INSURANCE | Attending: Oncology | Admitting: Physical Therapy

## 2013-08-27 ENCOUNTER — Other Ambulatory Visit: Payer: Self-pay | Admitting: Physician Assistant

## 2013-08-27 ENCOUNTER — Ambulatory Visit: Payer: PRIVATE HEALTH INSURANCE

## 2013-08-27 DIAGNOSIS — I890 Lymphedema, not elsewhere classified: Secondary | ICD-10-CM | POA: Insufficient documentation

## 2013-08-27 DIAGNOSIS — C50919 Malignant neoplasm of unspecified site of unspecified female breast: Secondary | ICD-10-CM

## 2013-08-27 DIAGNOSIS — C50412 Malignant neoplasm of upper-outer quadrant of left female breast: Secondary | ICD-10-CM

## 2013-08-27 DIAGNOSIS — C50411 Malignant neoplasm of upper-outer quadrant of right female breast: Secondary | ICD-10-CM

## 2013-08-27 DIAGNOSIS — IMO0001 Reserved for inherently not codable concepts without codable children: Secondary | ICD-10-CM | POA: Insufficient documentation

## 2013-08-27 DIAGNOSIS — M25519 Pain in unspecified shoulder: Secondary | ICD-10-CM | POA: Insufficient documentation

## 2013-08-28 ENCOUNTER — Encounter: Payer: Self-pay | Admitting: Physician Assistant

## 2013-08-28 ENCOUNTER — Telehealth: Payer: Self-pay | Admitting: *Deleted

## 2013-08-28 ENCOUNTER — Ambulatory Visit: Payer: PRIVATE HEALTH INSURANCE

## 2013-08-28 ENCOUNTER — Ambulatory Visit (HOSPITAL_BASED_OUTPATIENT_CLINIC_OR_DEPARTMENT_OTHER): Payer: PRIVATE HEALTH INSURANCE

## 2013-08-28 ENCOUNTER — Ambulatory Visit (HOSPITAL_BASED_OUTPATIENT_CLINIC_OR_DEPARTMENT_OTHER): Payer: PRIVATE HEALTH INSURANCE | Admitting: Physician Assistant

## 2013-08-28 ENCOUNTER — Other Ambulatory Visit (HOSPITAL_BASED_OUTPATIENT_CLINIC_OR_DEPARTMENT_OTHER): Payer: PRIVATE HEALTH INSURANCE

## 2013-08-28 ENCOUNTER — Telehealth: Payer: Self-pay | Admitting: Oncology

## 2013-08-28 VITALS — BP 149/80 | HR 67 | Temp 98.5°F | Resp 18 | Ht 64.0 in | Wt 228.7 lb

## 2013-08-28 DIAGNOSIS — C50412 Malignant neoplasm of upper-outer quadrant of left female breast: Secondary | ICD-10-CM

## 2013-08-28 DIAGNOSIS — C7951 Secondary malignant neoplasm of bone: Secondary | ICD-10-CM

## 2013-08-28 DIAGNOSIS — C50619 Malignant neoplasm of axillary tail of unspecified female breast: Secondary | ICD-10-CM

## 2013-08-28 DIAGNOSIS — C50411 Malignant neoplasm of upper-outer quadrant of right female breast: Secondary | ICD-10-CM

## 2013-08-28 DIAGNOSIS — C50919 Malignant neoplasm of unspecified site of unspecified female breast: Secondary | ICD-10-CM

## 2013-08-28 DIAGNOSIS — I890 Lymphedema, not elsewhere classified: Secondary | ICD-10-CM

## 2013-08-28 DIAGNOSIS — K5900 Constipation, unspecified: Secondary | ICD-10-CM

## 2013-08-28 DIAGNOSIS — R944 Abnormal results of kidney function studies: Secondary | ICD-10-CM

## 2013-08-28 DIAGNOSIS — M7989 Other specified soft tissue disorders: Secondary | ICD-10-CM

## 2013-08-28 DIAGNOSIS — C773 Secondary and unspecified malignant neoplasm of axilla and upper limb lymph nodes: Secondary | ICD-10-CM

## 2013-08-28 DIAGNOSIS — Z5111 Encounter for antineoplastic chemotherapy: Secondary | ICD-10-CM

## 2013-08-28 DIAGNOSIS — J910 Malignant pleural effusion: Secondary | ICD-10-CM

## 2013-08-28 DIAGNOSIS — J90 Pleural effusion, not elsewhere classified: Secondary | ICD-10-CM

## 2013-08-28 HISTORY — DX: Lymphedema, not elsewhere classified: I89.0

## 2013-08-28 LAB — CBC WITH DIFFERENTIAL/PLATELET
BASO%: 0.6 % (ref 0.0–2.0)
EOS%: 0.7 % (ref 0.0–7.0)
Eosinophils Absolute: 0 10*3/uL (ref 0.0–0.5)
MCH: 29.5 pg (ref 25.1–34.0)
MCHC: 32.6 g/dL (ref 31.5–36.0)
RDW: 13.7 % (ref 11.2–14.5)
WBC: 6 10*3/uL (ref 3.9–10.3)
lymph#: 1.1 10*3/uL (ref 0.9–3.3)

## 2013-08-28 LAB — COMPREHENSIVE METABOLIC PANEL (CC13)
ALT: 27 U/L (ref 0–55)
AST: 41 U/L — ABNORMAL HIGH (ref 5–34)
Albumin: 2.5 g/dL — ABNORMAL LOW (ref 3.5–5.0)
Anion Gap: 10 mEq/L (ref 3–11)
CO2: 30 mEq/L — ABNORMAL HIGH (ref 22–29)
Calcium: 8.5 mg/dL (ref 8.4–10.4)
Chloride: 93 mEq/L — ABNORMAL LOW (ref 98–109)
Glucose: 105 mg/dl (ref 70–140)
Potassium: 4.5 mEq/L (ref 3.5–5.1)
Sodium: 133 mEq/L — ABNORMAL LOW (ref 136–145)
Total Protein: 6.7 g/dL (ref 6.4–8.3)

## 2013-08-28 MED ORDER — SODIUM CHLORIDE 0.9 % IJ SOLN
10.0000 mL | INTRAMUSCULAR | Status: DC | PRN
  Administered 2013-08-28: 10 mL
  Filled 2013-08-28: qty 10

## 2013-08-28 MED ORDER — SODIUM CHLORIDE 0.9 % IV SOLN
Freq: Once | INTRAVENOUS | Status: AC
  Administered 2013-08-28: 13:00:00 via INTRAVENOUS

## 2013-08-28 MED ORDER — HEPARIN SOD (PORK) LOCK FLUSH 100 UNIT/ML IV SOLN
500.0000 [IU] | Freq: Once | INTRAVENOUS | Status: AC | PRN
  Administered 2013-08-28: 500 [IU]
  Filled 2013-08-28: qty 5

## 2013-08-28 MED ORDER — DEXAMETHASONE SODIUM PHOSPHATE 10 MG/ML IJ SOLN
INTRAMUSCULAR | Status: AC
  Filled 2013-08-28: qty 1

## 2013-08-28 MED ORDER — ONDANSETRON 8 MG/50ML IVPB (CHCC)
8.0000 mg | Freq: Once | INTRAVENOUS | Status: AC
  Administered 2013-08-28: 8 mg via INTRAVENOUS

## 2013-08-28 MED ORDER — PACLITAXEL PROTEIN-BOUND CHEMO INJECTION 100 MG
100.0000 mg/m2 | Freq: Once | INTRAVENOUS | Status: AC
  Administered 2013-08-28: 225 mg via INTRAVENOUS
  Filled 2013-08-28: qty 45

## 2013-08-28 MED ORDER — ONDANSETRON 8 MG/NS 50 ML IVPB
INTRAVENOUS | Status: AC
  Filled 2013-08-28: qty 8

## 2013-08-28 MED ORDER — DEXAMETHASONE SODIUM PHOSPHATE 10 MG/ML IJ SOLN
10.0000 mg | Freq: Once | INTRAMUSCULAR | Status: AC
  Administered 2013-08-28: 10 mg via INTRAVENOUS

## 2013-08-28 NOTE — Progress Notes (Signed)
ID: [name]   DOB: 08-15-1946  MR#: [id number]  XBJ#:[id number]  PCP: Egbert Garibaldi, NP GYN:  SUFelicity Pellegrini OTHER MD: Arvilla Meres, Antony Blackbird, Rexanne Mano  CHIEF COMPLAINT:  Metastatic Breast Cancer   HISTORY OF PRESENT ILLNESS: The patient undergoes annual screening mammography.  Her mammograms previously had fullness.  In August 2011 she presented with some firmness of her right breast. She also noted some skin thickening around her nipple and some tenderness.  She was subsequently seen for this on 05/13/2010 and was referred for bilateral mammogram and right breast ultrasound.  There was concern that this may be mastitis.  There were diffuse skin thickening, tissue edema, enlarged lymph node in right axilla, some subareolar distortion secondary to the previous surgery.  She was given a course of doxycycline for 10 days and asked to return. A follow-up ultrasound of the breast showed persistent abnormalities.  She was referred for biopsy.  She underwent biopsy of the right axilla on 05/27/2010 and it showed metastatic carcinoma consistent with breast primary.  This was ER and PR positive, HER-2 was positive with a ratio of 3.09.  The ER ratio is 34%, PR was 64%, proliferative index 60%.  She subsequently had a MRI scan of both breasts on 06/03/2010 and this showed a area of minimal enhancement in the right breast measuring 10.0 x 6.0 x 3.0 cm.  Enlarged lymph nodes were seen in the right axilla.  Largest node measuring 3.0 x 1.0 x 7.0 cm.  Intramammary lymph nodes were also seen in the outer left breast, the largest measuring 2.8 x 2.5 cm.  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   The patient's subsequent history is as detailed below.  INTERVAL HISTORY: Jeanette Morris returns today accompanied by her husband Jeanette Morris for followup of her metastatic breast cancer. She's currently receiving Abraxane given on days one and 8 of  each 21 day cycle, with Neulasta on day 2. She is due for day 1 cycle 2 of therapy today.   She is currently at the [facility], where she is receiving rehabilitation, but she plans to be discharged home tomorrow, January 1, she tells me. She has a walker and has been doing quite a bit of physical therapy. She still feels very tired and weak, however, and walks only short distances.  The swelling in her upper extremities and the pain in the right shoulder are the biggest complaints at this point. She is having better pain control with the OxyContin and oxycodone, and I do not think she asks for pain medication as frequently as she should. Here in the office today she tells me her pain is approximately a 7 on a scale of 1-10. I reminded her that she will have to ask for the pain medication, but that she should do so long before it reaches a level of 7.  REVIEW OF SYSTEMS: Jeanette Morris tells me that otherwise she actually "feels pretty good". She's had no known fevers. She denies any skin changes and has had no abnormal bruising or bleeding. Her appetite is reduced, and she has some occasional nausea, especially when she first wakes up in the morning. Thus far, her antinausea medication has been effective, but again I do not think she requests it frequently enough.  She's been a little more constipated than usual, especially since the recent increase in her pain medication. She's had no increased cough or  phlegm production, but she does get short of breath with even minor exertion. She's also been propping her head up slightly to sleep. She denies any chest pain or palpitations. She's had some increased swelling in her ankles, and this improves in the morning and worsens throughout the day. It is equal on both the right and the left.   Otherwise a detailed review of systems is noncontributory.   PAST MEDICAL HISTORY: Past Medical History  Diagnosis Date  . Hypertension   . Hearing loss      right ear  . Hyperlipidemia   . Arthritis   . Dysrhythmia     A fib with IV chemo treatments  . Shortness of breath     with exertion  . Tuberculosis     years ago-had 1 year tx  . 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 Breast cancer     (Rt) breast ca dx 9/11  . 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 Neuropathy   . History of radiation therapy 01/18/11-03/07/11    5940 cGy to Right chest wall, axilla and supraclavicular region  . Lymphedema of arm 08/28/2013    PAST SURGICAL HISTORY: Past Surgical History  Procedure Laterality Date  . Tubal ligation    . Portacath placement Left   . Abdominal hysterectomy    . Lung biopsy    . Lesion excision      L anterior thigh  . Breast surgery  12/01/10    ER+,PR+,Ki 67 64%,Her2 -; right breast lumpectomy  . Chest tube insertion  08/08/2012    Procedure: INSERTION PLEURAL DRAINAGE CATHETER;  Surgeon: Alleen Borne, MD;  Location: [facility] OR;  Service: Thoracic;  Laterality: Right;  . Removal of pleural drainage catheter Right 10/10/2012    Procedure: REMOVAL OF PLEURAL DRAINAGE CATHETER;  Surgeon: Alleen Borne, MD;  Location: [facility] OR;  Service: Thoracic;  Laterality: Right;  . Mastectomy, radical Left 05/08/2013    Dr Carolynne Edouard  . Mastectomy modified radical Left 05/08/2013    Procedure: MASTECTOMY MODIFIED RADICAL;  Surgeon: Robyne Askew, MD;  Location: [facility] OR;  Service: General;  Laterality: Left;    FAMILY HISTORY History reviewed. No pertinent family history. The patient's father died in an automobile accident at age 63. The patient's mother died at age 73 with Alzheimer's disease. The patient had 8 brothers and 4 sisters. There is no history of breast or ovarian cancer in the family.  GYNECOLOGIC HISTORY: Menarche age 15, first live birth age 27. She is GX P7. She underwent menopause "more than 20 years ago". She never took hormone replacement.  SOCIAL HISTORY:  (Updated November 2014) She has been mostly a housewife. Her husband Jeanette Morris used to  be a cook at [employer]. He completed radiation therapy for prostate cancer under [facility] April of 2014. The patient's children are from an earlier marriage, and include Jeanette Morris, Jeanette Morris, [address], [address], Jeanette Morris, [place], and [employer]. The patient has 15 grandchildren and 1 great-grandchild. She attends [employer]  ADVANCED DIRECTIVES: Not in place  HEALTH MAINTENANCE: (Updated 07/04/2013) History  Substance Use Topics  . Smoking status: Former Smoker    Quit date: 06/30/1973  . Smokeless tobacco: Never Used  . Alcohol Use: No     Colonoscopy:  Not on file  PAP: Not on file  Bone density:  August 2012, Normal  Lipid panel: Not on file   No Known Allergies  Current Outpatient Prescriptions  Medication Sig Dispense Refill  . Calcium Carbonate-Vitamin D 600-400 MG-UNIT per tablet Take 1 tablet by mouth every morning.       . carvedilol (COREG) 6.25 MG tablet Take 1 tablet (6.25 mg total) by mouth 2 (two) times daily with a meal. She takes with a 12.5 mg tablet to equal her dose of 18.75 mg.  60 tablet  6  . cyanocobalamin 500 MCG tablet Take 500 mcg by mouth daily with breakfast.       . ferrous sulfate 325 (65 FE) MG tablet Take 1 tablet (325 mg total) by mouth daily with breakfast.  30 tablet  1  . gabapentin (NEURONTIN) 300 MG capsule Take 300 mg by mouth at bedtime.       . ondansetron (ZOFRAN) 8 MG tablet Take 1 tablet (8 mg total) by mouth every 8 (eight) hours as needed for nausea or vomiting. Take two times a day starting the day after chemo for 2 days. Then take two times a day as needed for nausea or vomiting.  30 tablet  1  . oxyCODONE (OXY IR/ROXICODONE) 5 MG immediate release tablet Take 1 tablet (5 mg total) by mouth every 4 (four) hours as needed for severe pain.  30 tablet  0  . OxyCODONE (OXYCONTIN) 20 mg T12A 12 hr tablet Take 1 tablet (20 mg total) by mouth every 12 (twelve) hours.  60 tablet  0  .  senna (SENOKOT) 8.6 MG TABS tablet Take 1 tablet (8.6 mg total) by mouth daily as needed for mild constipation.  120 each  0  . tamoxifen (NOLVADEX) 20 MG tablet       . acetaminophen (TYLENOL) 325 MG tablet Take 2 tablets (650 mg total) by mouth every 6 (six) hours as needed for mild pain (or Fever >/= 101).  100 tablet  12  . furosemide (LASIX) 20 MG tablet Take 1 tablet (20 mg total) by mouth daily.  3 tablet  0  . polyethylene glycol (MIRALAX / GLYCOLAX) packet Take 17 g by mouth daily.  14 each  0  . prochlorperazine (COMPAZINE) 10 MG tablet Take 1 tablet (10 mg total) by mouth every 6 (six) hours as needed (Nausea or vomiting).  30 tablet  1   No current facility-administered medications for this visit.   Facility-Administered Medications Ordered in Other Visits  Medication Dose Route Frequency Provider Last Rate Last Dose  . heparin lock flush 100 unit/mL  500 Units Intracatheter Once PRN Lowella Dell, MD      . PACLitaxel-protein bound (ABRAXANE) chemo infusion 225 mg  100 mg/m2 (Treatment Plan Actual) Intravenous Once Lowella Dell, MD      . sodium chloride 0.9 % injection 10 mL  10 mL Intracatheter PRN Lowella Dell, MD        OBJECTIVE:  African American woman examined in a wheelchair  Filed Vitals:   08/28/13 1134  BP: 149/80  Pulse: 67  Temp: 98.5 F (36.9 C)  Resp: 18     Body mass index is 39.24 kg/(m^2).    ECOG FS: 2  Filed Weights   08/28/13 1134  Weight: 228 lb 11.2 oz (103.738 kg)  Physical Exam: HEENT:  Sclerae anicteric.  Oropharynx clear but dry. No ulcerations and no evidence of oropharyngeal candidiasis  NODES:  No cervical or supraclavicular lymphadenopathy palpated.  BREAST EXAM: Patient is status post bilateral mastectomies with some palpable nodularity on the left chest wall, but no obvious skin  changes. LUNGS:  Clear to auscultation bilaterally, but with slightly decreased breath sounds in the left base .  No wheezes or rhonchi HEART:   Regular rate and rhythm. No murmur  ABDOMEN:  Soft,  obese, nontender.  Positive bowel sounds.  MSK:  Limited range of motion bilaterally in the upper extremities do to excessive swelling. No increased joint swelling.   EXTREMITIES:   lymphedema bilaterally in the upper extremities, grade 3. Left upper extremity is wrapped. 1+ pitting edema bilaterally in the lower extremities, equal bilaterally.   SKIN:  Benign with no obvious skin changes or rashes.  NEURO:  Nonfocal. Well oriented.  Appropriate  affect.    LAB RESULTS: Lab Results  Component Value Date   WBC 6.0 08/28/2013   NEUTROABS 4.2 08/28/2013   HGB 9.3* 08/28/2013   HCT 28.6* 08/28/2013   MCV 90.6 08/28/2013   PLT 144* 08/28/2013      Chemistry      Component Value Date/Time   NA 133* 08/28/2013 1121   NA 129* 08/12/2013 0600   K 4.5 08/28/2013 1121   K 5.6* 08/12/2013 0600   CL 93* 08/12/2013 0600   CL 106 02/14/2013 1048   CO2 30* 08/28/2013 1121   CO2 33* 08/12/2013 0600   BUN 17.4 08/28/2013 1121   BUN 29* 08/12/2013 0600   CREATININE 1.9* 08/28/2013 1121   CREATININE 1.34* 08/12/2013 0600      Component Value Date/Time   CALCIUM 8.5 08/28/2013 1121   CALCIUM 8.5 08/12/2013 0600   ALKPHOS 115 08/28/2013 1121   ALKPHOS 57 08/12/2013 0600   AST 41* 08/28/2013 1121   AST 42* 08/12/2013 0600   ALT 27 08/28/2013 1121   ALT 19 08/12/2013 0600   BILITOT 0.34 08/28/2013 1121   BILITOT 0.5 08/12/2013 0600       Lab Results  Component Value Date   LABCA2 95* 08/14/2012    STUDIES: Ct Chest W Contrast  07/02/2013   CLINICAL DATA:  Restaging bilateral breast cancer diagnosed in 2011. Radiation therapy completed. Chemotherapy ongoing. Patient reports shortness of breath.  EXAM: CT CHEST WITH CONTRAST  TECHNIQUE: Multidetector CT imaging of the chest was performed during intravenous contrast administration.  CONTRAST:  80mL OMNIPAQUE IOHEXOL 300 MG/ML  SOLN  COMPARISON:  PET-CT today and chest CT 12/18/2012.   FINDINGS: Compared with the prior study, there is new diffuse subcutaneous edema throughout the upper chest and visualized upper arms. There is new ill-defined nodularity in the left supraclavicular area, hypermetabolic on PET and worrisome for lymphadenopathy. A representative lesion measures 1.9 cm on image 1. There is also new nodularity anterior to the left pectoralis major muscle, measuring 1.7 cm on image 10, also hypermetabolic on PET. There is asymmetric nodular enlargement of the left pectoralis major muscle more inferiorly without well defined mass. Ill-defined increased nodal tissue is present within the left axilla. No right axillary masses are identified following lymph node dissection.  Asymmetric heterogeneous enlargement of the left thyroid lobe and resulting tracheal deviation to the right are stable, consistent with goiter. No enlarged mediastinal or hilar lymph nodes are identified. There is diffuse atherosclerosis of the aorta and coronary arteries.  There are enlarging moderate right greater than left pleural effusions with associated bibasilar atelectasis. No pulmonary nodules, endobronchial lesions or confluent airspace opacities are identified. There is minimal ground-glass opacity at the right apex measuring 10 mm on image 11.  Images through the upper abdomen again demonstrate extensive calcification throughout the gallbladder wall. No focal  liver lesions are identified.  There is a lytic metastasis involving the left aspect of the T11 vertebral body. There are suspected metastases involving the upper right ribs  IMPRESSION: 1. Diffuse soft tissue edema throughout the upper chest and bilateral arms, likely in part secondary to radiation therapy. Superimposed lymphedema is possible. 2. Increasing ill-defined nodularity is noted in the left supraclavicular, left axillary and left anterior chest wall regions worrisome for metastatic disease given the associated hypermetabolic activity on  today's PET-CT. 3. No mediastinal adenopathy or pulmonary parenchymal metastases identified. 4. New bilateral pleural effusions and associated bibasilar atelectasis. The right effusion is potentially malignant as correlated with today's PET-CT. 5. Osseous metastases involving the upper right ribs and T11 vertebral body. 6. Grossly stable goiter and porcelain gallbladder.   Electronically Signed   By: Roxy Horseman M.D.   On: 07/02/2013 15:57   Nm Pet Image Restag (ps) Skull Base To Thigh  07/02/2013   CLINICAL DATA:  Subsequent treatment strategy for breast cancer. Restaging bilateral breast cancer diagnosed in 2011. Radiation therapy completed. Chemotherapy ongoing. Patient reports shortness of breath.  EXAM: NUCLEAR MEDICINE PET SKULL BASE TO THIGH  FASTING BLOOD GLUCOSE:  Value: 104mg /dl  TECHNIQUE: 16.1 mCi W-96 FDG was injected intravenously. CT data was obtained and used for attenuation correction and anatomic localization only. (This was not acquired as a diagnostic CT examination.) Additional exam technical data entered on technologist worksheet.  COMPARISON:  Chest CT performed today and PET-CT 04/03/2012.  FINDINGS: The examination is mildly limited by body habitus.  NECK  There are multiple new ill-defined hypermetabolic left cervical lymph nodes, confluent in the left supraclavicular area. SUV max is up to 12.6. There are smaller hypermetabolic supraclavicular lymph nodes on the right. Asymmetric goiter is again noted without significantly increased metabolic activity or significant change.  CHEST  There is diffusely increased hypermetabolic activity in the left axilla corresponding with increased soft tissue density between the surgical clips worrisome for confluent lymphadenopathy. There is also hypermetabolic activity within the left pectoralis muscle which demonstrates ill-defined nodular enlargement. There are several subcutaneous nodules within the left anterior upper chest which are new and  hypermetabolic. Hypermetabolic soft tissue activity in the upper right arm is without definite corresponding nodal disease and may reflect myositis or physiologic muscular activity.  No discrete hypermetabolic mediastinal or hilar lymph nodes are identified. There is a new right pleural effusion with asymmetric hypermetabolic activity at the right apex, concerning for a malignant effusion. Some of this is likely due to underlying osseous metastatic disease in the ribs and upper thoracic spine.  ABDOMEN/PELVIS  Hepatic evaluation is limited by body habitus. No discrete hepatic metastases are identified. There is no evidence metastatic disease to the adrenal glands, spleen or pancreas. 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 There is ill-defined edema within the base of the mesentery with associated hypermetabolic activity. There is a hypermetabolic left external iliac node measuring 1.3 cm on image 179 (SUV max 9.4). No discrete enlarged abdominal lymph nodes or peritoneal nodules identified.  SKELETON  There is multifocal osseous activity consistent with metastatic disease. Asymmetric activity is present at the right lung apex, likely involving the upper right ribs and upper thoracic spine. There is a lytic hypermetabolic lesion involving the left aspect of the T11 vertebral body. Additional metastases are present within the lumbar spine and bony pelvis.  IMPRESSION: 1. Findings are consistent with extensive upper left chest wall recurrence with multiple hypermetabolic lymph nodes in the neck and left axilla. There  is probable involvement of the left pectoralis musculature. 2. Multifocal osseous metastases involving the ribs, spine and pelvis. Spinal involvement is limited by body habitus. If the patient has focal neurologic symptoms, further evaluation with MRI may be helpful. 3. Probable malignant right pleural effusion. 4. Atypical central mesenteric hypermetabolic activity without obvious associated nodal mass.  This could be due to superimposed pancreatitis.   Electronically Signed   By: Roxy Horseman M.D.   On: 07/02/2013 15:54    ASSESSMENT: 67 y.o. [place] woman with stage 4 breast cancer  (1) status post RIGHT axillary lymph node biopsy 05/27/2010 for a clinical T4 N1, stage IIIB (inflammatory) invasive ductal carcinoma, grade not stated, estrogen receptor 94% positive, progesterone receptor 64% positive, with an MIB-1 of 60%, and HER-2 amplification by FISH with a ratio of 7.5.  (2) neoadjuvant chemotherapy consisted of 6 cycles of standard carboplatin, docetaxel, trastuzumab, with the trastuzumab held cycles 5 and 6 due to a drop in her borderline ejection fraction (from 45-50% at baseline to 40-45% January 2012)-- total trastuzumab treatment = 2 months  (3) status post right mastectomy and axillary lymph node dissection 12/01/2010, showing no residual invasive carcinoma in the breast, but residual tumor in 20 of 21 axillary lymph nodes. (Only one of the 20 positive lymph nodes showed a significant residual tumor, measuring 5 mm; the remaining lymph nodes, including the evidence of extracapsular extension, showed single and clusters a few neoplastic cells with extensive therapy related changes).  (4) postmastectomy radiation to the right chest wall, axilla, and supraclavicular region completed 03/07/2011  (5) additional 2 months of trastuzumab given February through April 2013, again discontinued because of a drop in the ejection fraction and lateral S'  (6) on letrozole July 2012 to September 2013  (7) LEFT axillary adenopathy noted by Dr. Carolynne Edouard on exam July 2013, with biopsy of a left axillary node 04/17/2012 showing an invasive ductal carcinoma, estrogen receptor 91% positive, progesterone receptor 9% positive, with an MIB-1 of 45%, and no HER-2 amplification.   (8) PET scan 04/03/2012 showed multiple enlarged left axillary and small left supraclavicular lymph nodes that are moderately  hypermetabolic, with some hypermetabolic activity within the left axillary tail. No other hypermetabolic lymph nodes identified. There was no involvement of the right axilla, mediastinum or right chest wall and no extrathoracic metastases were demonstrated.  (8) on exemestane and everolimus September through December 2013  (9) s/p RIGHT thoracentesis 08/01/2012, cytologically positive; s/p temporary Pleurx; effusion resolved after Abraxane therapy  (10) Abraxane started 08/16/2012, given day 1 and day 8 of each 21 day cycle, completed 6 cycles (12 doses) 12/06/2012, with excellent response  (11) fulvestrant started 12/19/2012, stopped 04/11/2013 with progression  (12) trastuzumab resumed 12/21/2011. We scheduled the doses 4 weeks apart to see if they caould be better tolerated. However echo 02/04/2013 again showed a drop in the EF and S', so Herceptin stopped (last dose 01/17/2013). Most recent echo, 04/18/2013, shows the ejection fraction to have recovered.  (13) status post left modified radical mastectomy 05/08/2013 for a pT4, pN2a [tumor extended over 15 cm, all 6 sampled lymph nodes were involved) invasive lobular breast cancer, grade 3, which was HER-2 negative. It was estrogen receptor positive at 72%, progesterone receptor negative. Posterior margin was broadly positive  (14) tamoxifen started 05/29/2013, discontinued December 2014 with progression  (15) post-mastectomy irradiation to left chest wall and left Troup fossa planned, beginning 07/09/2013  (16) new baseline staging scans including a chest CT and PET scan were obtained  07/02/2013 suggesting progression of disease, but that is when compared to previous scans in August 2013. However in December of 2014 there was obvious disease progression on the chest wall  (17) Abraxane resumed 08/07/2013, the plan being for treatments day 1 and day 8 of every 21 day cycle, with restaging after 2 full cycles.  PLAN:  Nylah will receive her  day 1 cycle 2 dose of Abraxane today as scheduled, then again next week on January 7. Once she has completed these 2 cycles of Abraxane, we are requesting a repeat chest CT and PET scan (approximately January 13) to assess response to therapy or progression of disease. She will see Dr. Darnelle Catalan soon thereafter to review those results and discuss her treatment plan.   She already has all of her pain medications and antinausea medications and understands that she should take these medications at the first sign of pain/nausea for prevention, rather than only treating it when it becomes extreme.  Her serum creatinine was elevated at 1.9 today, up from 1.3 on 08/14/2013. I have sent a copy of this to the physician at her nursing facility, along with in order to encourage oral fluids and measure input/output. This will be followed closely, and will be repeated when she returns next week.  We will also follow her closely for any increased anemia, and can treat with blood transfusions if it becomes necessary. Currently, however, her hemoglobin is stable when compared to the last 2 weeks.  I do believe that Tasmine is a fall risk at home, and I think it would be a good idea to consider a physical therapy and occupational therapy consult once she is discharged from the nursing facility. Jeanette Morris tells me he will mention it to the doctors and nurses there, but we can certainly help him with that as well if necessary.    Anjelita and [place] both voice understanding and agreement with this plan today, and they both know to call with any changes or problems prior to her appointment here next Wednesday.   Jadin Creque, PA-C     08/28/2013

## 2013-08-28 NOTE — Patient Instructions (Signed)
[facility] Discharge Instructions for Patients Receiving Chemotherapy  Today you received the following chemotherapy agents: Abraxane   To help prevent nausea and vomiting after your treatment, we encourage you to take your nausea medication as needed.   If you develop nausea and vomiting that is not controlled by your nausea medication, call the clinic.   BELOW ARE SYMPTOMS THAT SHOULD BE REPORTED IMMEDIATELY:  *FEVER GREATER THAN 100.5 F  *CHILLS WITH OR WITHOUT FEVER  NAUSEA AND VOMITING THAT IS NOT CONTROLLED WITH YOUR NAUSEA MEDICATION  *UNUSUAL SHORTNESS OF BREATH  *UNUSUAL BRUISING OR BLEEDING  TENDERNESS IN MOUTH AND THROAT WITH OR WITHOUT PRESENCE OF ULCERS  *URINARY PROBLEMS  *BOWEL PROBLEMS  UNUSUAL RASH Items with * indicate a potential emergency and should be followed up as soon as possible.  Feel free to call the clinic you have any questions or concerns. The clinic phone number is [phone].    

## 2013-08-28 NOTE — Telephone Encounter (Signed)
Per staff message and POF I have scheduled appts.  JMW  

## 2013-08-30 ENCOUNTER — Ambulatory Visit: Payer: PRIVATE HEALTH INSURANCE | Attending: Oncology | Admitting: Physical Therapy

## 2013-08-30 ENCOUNTER — Ambulatory Visit: Payer: PRIVATE HEALTH INSURANCE

## 2013-08-30 DIAGNOSIS — IMO0001 Reserved for inherently not codable concepts without codable children: Secondary | ICD-10-CM | POA: Insufficient documentation

## 2013-08-30 DIAGNOSIS — I890 Lymphedema, not elsewhere classified: Secondary | ICD-10-CM | POA: Insufficient documentation

## 2013-08-30 DIAGNOSIS — M25519 Pain in unspecified shoulder: Secondary | ICD-10-CM | POA: Insufficient documentation

## 2013-09-02 ENCOUNTER — Ambulatory Visit: Payer: PRIVATE HEALTH INSURANCE

## 2013-09-02 ENCOUNTER — Ambulatory Visit: Payer: PRIVATE HEALTH INSURANCE | Admitting: Physical Therapy

## 2013-09-03 ENCOUNTER — Encounter (HOSPITAL_COMMUNITY): Payer: PRIVATE HEALTH INSURANCE

## 2013-09-03 ENCOUNTER — Ambulatory Visit (HOSPITAL_COMMUNITY): Payer: PRIVATE HEALTH INSURANCE

## 2013-09-03 ENCOUNTER — Ambulatory Visit: Payer: PRIVATE HEALTH INSURANCE | Admitting: Physical Therapy

## 2013-09-03 ENCOUNTER — Ambulatory Visit: Payer: PRIVATE HEALTH INSURANCE

## 2013-09-04 ENCOUNTER — Encounter: Payer: Self-pay | Admitting: Physician Assistant

## 2013-09-04 ENCOUNTER — Other Ambulatory Visit: Payer: Self-pay | Admitting: Physician Assistant

## 2013-09-04 ENCOUNTER — Ambulatory Visit (HOSPITAL_BASED_OUTPATIENT_CLINIC_OR_DEPARTMENT_OTHER): Payer: PRIVATE HEALTH INSURANCE | Admitting: Physician Assistant

## 2013-09-04 ENCOUNTER — Telehealth: Payer: Self-pay | Admitting: *Deleted

## 2013-09-04 ENCOUNTER — Encounter: Payer: Self-pay | Admitting: Oncology

## 2013-09-04 ENCOUNTER — Ambulatory Visit: Payer: PRIVATE HEALTH INSURANCE

## 2013-09-04 ENCOUNTER — Other Ambulatory Visit (HOSPITAL_BASED_OUTPATIENT_CLINIC_OR_DEPARTMENT_OTHER): Payer: PRIVATE HEALTH INSURANCE

## 2013-09-04 ENCOUNTER — Ambulatory Visit (HOSPITAL_BASED_OUTPATIENT_CLINIC_OR_DEPARTMENT_OTHER): Payer: PRIVATE HEALTH INSURANCE

## 2013-09-04 VITALS — BP 146/85 | HR 90 | Temp 98.7°F | Resp 18 | Ht 64.0 in | Wt 226.0 lb

## 2013-09-04 DIAGNOSIS — K5900 Constipation, unspecified: Secondary | ICD-10-CM

## 2013-09-04 DIAGNOSIS — C50411 Malignant neoplasm of upper-outer quadrant of right female breast: Secondary | ICD-10-CM

## 2013-09-04 DIAGNOSIS — J910 Malignant pleural effusion: Secondary | ICD-10-CM

## 2013-09-04 DIAGNOSIS — C50919 Malignant neoplasm of unspecified site of unspecified female breast: Secondary | ICD-10-CM | POA: Insufficient documentation

## 2013-09-04 DIAGNOSIS — C50619 Malignant neoplasm of axillary tail of unspecified female breast: Secondary | ICD-10-CM

## 2013-09-04 DIAGNOSIS — C7952 Secondary malignant neoplasm of bone marrow: Secondary | ICD-10-CM

## 2013-09-04 DIAGNOSIS — Z5111 Encounter for antineoplastic chemotherapy: Secondary | ICD-10-CM

## 2013-09-04 DIAGNOSIS — C773 Secondary and unspecified malignant neoplasm of axilla and upper limb lymph nodes: Secondary | ICD-10-CM

## 2013-09-04 DIAGNOSIS — R110 Nausea: Secondary | ICD-10-CM

## 2013-09-04 DIAGNOSIS — C50412 Malignant neoplasm of upper-outer quadrant of left female breast: Secondary | ICD-10-CM

## 2013-09-04 DIAGNOSIS — M25559 Pain in unspecified hip: Secondary | ICD-10-CM

## 2013-09-04 DIAGNOSIS — R7989 Other specified abnormal findings of blood chemistry: Secondary | ICD-10-CM

## 2013-09-04 DIAGNOSIS — C7951 Secondary malignant neoplasm of bone: Secondary | ICD-10-CM

## 2013-09-04 DIAGNOSIS — D630 Anemia in neoplastic disease: Secondary | ICD-10-CM

## 2013-09-04 DIAGNOSIS — I10 Essential (primary) hypertension: Secondary | ICD-10-CM

## 2013-09-04 DIAGNOSIS — I890 Lymphedema, not elsewhere classified: Secondary | ICD-10-CM

## 2013-09-04 DIAGNOSIS — J90 Pleural effusion, not elsewhere classified: Secondary | ICD-10-CM

## 2013-09-04 LAB — CBC WITH DIFFERENTIAL/PLATELET
BASO%: 0.9 % (ref 0.0–2.0)
Basophils Absolute: 0 10*3/uL (ref 0.0–0.1)
EOS%: 1.3 % (ref 0.0–7.0)
Eosinophils Absolute: 0 10*3/uL (ref 0.0–0.5)
HCT: 25.9 % — ABNORMAL LOW (ref 34.8–46.6)
HGB: 8.5 g/dL — ABNORMAL LOW (ref 11.6–15.9)
LYMPH%: 33.5 % (ref 14.0–49.7)
MCH: 29.4 pg (ref 25.1–34.0)
MCHC: 32.7 g/dL (ref 31.5–36.0)
MCV: 90 fL (ref 79.5–101.0)
MONO#: 0.2 10*3/uL (ref 0.1–0.9)
MONO%: 9.1 % (ref 0.0–14.0)
NEUT#: 1.3 10*3/uL — ABNORMAL LOW (ref 1.5–6.5)
NEUT%: 55.2 % (ref 38.4–76.8)
Platelets: 163 10*3/uL (ref 145–400)
RBC: 2.88 10*6/uL — ABNORMAL LOW (ref 3.70–5.45)
RDW: 13.8 % (ref 11.2–14.5)
WBC: 2.4 10*3/uL — ABNORMAL LOW (ref 3.9–10.3)
lymph#: 0.8 10*3/uL — ABNORMAL LOW (ref 0.9–3.3)

## 2013-09-04 LAB — COMPREHENSIVE METABOLIC PANEL (CC13)
ALBUMIN: 2.6 g/dL — AB (ref 3.5–5.0)
ALK PHOS: 128 U/L (ref 40–150)
ALT: 45 U/L (ref 0–55)
ANION GAP: 5 meq/L (ref 3–11)
AST: 63 U/L — ABNORMAL HIGH (ref 5–34)
BUN: 17.1 mg/dL (ref 7.0–26.0)
CO2: 30 meq/L — AB (ref 22–29)
Calcium: 8.8 mg/dL (ref 8.4–10.4)
Chloride: 99 mEq/L (ref 98–109)
Creatinine: 1.1 mg/dL (ref 0.6–1.1)
GLUCOSE: 104 mg/dL (ref 70–140)
POTASSIUM: 4.7 meq/L (ref 3.5–5.1)
SODIUM: 134 meq/L — AB (ref 136–145)
TOTAL PROTEIN: 6.9 g/dL (ref 6.4–8.3)
Total Bilirubin: 0.5 mg/dL (ref 0.20–1.20)

## 2013-09-04 MED ORDER — HEPARIN SOD (PORK) LOCK FLUSH 100 UNIT/ML IV SOLN
500.0000 [IU] | Freq: Once | INTRAVENOUS | Status: AC | PRN
  Administered 2013-09-04: 500 [IU]
  Filled 2013-09-04: qty 5

## 2013-09-04 MED ORDER — DEXAMETHASONE SODIUM PHOSPHATE 10 MG/ML IJ SOLN
10.0000 mg | Freq: Once | INTRAMUSCULAR | Status: AC
  Administered 2013-09-04: 10 mg via INTRAVENOUS

## 2013-09-04 MED ORDER — SODIUM CHLORIDE 0.9 % IJ SOLN
10.0000 mL | INTRAMUSCULAR | Status: DC | PRN
  Administered 2013-09-04: 10 mL
  Filled 2013-09-04: qty 10

## 2013-09-04 MED ORDER — PACLITAXEL PROTEIN-BOUND CHEMO INJECTION 100 MG
100.0000 mg/m2 | Freq: Once | INTRAVENOUS | Status: AC
  Administered 2013-09-04: 225 mg via INTRAVENOUS
  Filled 2013-09-04: qty 45

## 2013-09-04 MED ORDER — PROCHLORPERAZINE MALEATE 10 MG PO TABS: 10.0000 mg | ORAL_TABLET | Freq: Four times a day (QID) | ORAL | Status: DC | PRN

## 2013-09-04 MED ORDER — ONDANSETRON 8 MG/50ML IVPB (CHCC)
8.0000 mg | Freq: Once | INTRAVENOUS | Status: AC
  Administered 2013-09-04: 8 mg via INTRAVENOUS

## 2013-09-04 MED ORDER — ONDANSETRON 8 MG/NS 50 ML IVPB
INTRAVENOUS | Status: AC
  Filled 2013-09-04: qty 8

## 2013-09-04 MED ORDER — SODIUM CHLORIDE 0.9 % IV SOLN
Freq: Once | INTRAVENOUS | Status: AC
  Administered 2013-09-04: 13:00:00 via INTRAVENOUS

## 2013-09-04 MED ORDER — DEXAMETHASONE SODIUM PHOSPHATE 10 MG/ML IJ SOLN
INTRAMUSCULAR | Status: AC
  Filled 2013-09-04: qty 1

## 2013-09-04 NOTE — Telephone Encounter (Signed)
appts made and printed...td 

## 2013-09-04 NOTE — Progress Notes (Signed)
ID: Jeanette Morris   DOB: 03-19-1946  MR#: [id number]  HSV#:[id number]  PCP: Egbert Garibaldi, NP GYN:  SUFelicity Pellegrini OTHER MD: Arvilla Meres, Antony Blackbird, Rexanne Mano  CHIEF COMPLAINT:  Metastatic Breast Cancer   HISTORY OF PRESENT ILLNESS: The patient undergoes annual screening mammography.  Her mammograms previously had fullness.  In August 2011 she presented with some firmness of her right breast. She also noted some skin thickening around her nipple and some tenderness.  She was subsequently seen for this on 05/13/2010 and was referred for bilateral mammogram and right breast ultrasound.  There was concern that this may be mastitis.  There were diffuse skin thickening, tissue edema, enlarged lymph node in right axilla, some subareolar distortion secondary to the previous surgery.  She was given a course of doxycycline for 10 days and asked to return. A follow-up ultrasound of the breast showed persistent abnormalities.  She was referred for biopsy.  She underwent biopsy of the right axilla on 05/27/2010 and it showed metastatic carcinoma consistent with breast primary.  This was ER and PR positive, HER-2 was positive with a ratio of 3.09.  The ER ratio is 34%, PR was 64%, proliferative index 60%.  She subsequently had a MRI scan of both breasts on 06/03/2010 and this showed a area of minimal enhancement in the right breast measuring 10.0 x 6.0 x 3.0 cm.  Enlarged lymph nodes were seen in the right axilla.  Largest node measuring 3.0 x 1.0 x 7.0 cm.  Intramammary lymph nodes were also seen in the outer left breast, the largest measuring 2.8 x 2.5 cm.  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   The patient's subsequent history is as detailed below.  INTERVAL HISTORY: Jeanette Morris returns today accompanied by her husband [name] for followup of her metastatic breast cancer. She's currently being treated with Abraxane, given on days 1  and 8 of each 21 day cycle, with Neulasta on day 9. She is due for day 8 cycle 2 of therapy today.   Jeanette Morris was discharged from [facility] and returned home on New Years day.  She is doing well at home and tells me it is "beautiful to be back home". When discharged, she tells me it was requested that home health evaluate Frederika with both physical therapy and occupational therapy. It has not yet been scheduled.    She is on home oxygen via nasal cannula, and this has helped her shortness of breath significantly. She has a walker that she uses occasionally, but is trying to walk short distances on her own. She still feels weak and tired.  The swelling in her upper extremities and the pain in the right shoulder continued to be Daley's biggest complaints. She continues on OxyContin twice daily, and takes oxycodone as needed. She's doing a little better job with maintaining pain control, and tells me her pain is a 4 or 5 on a scale of 1-10.  She has had some mild constipation for which she has used MiraLAX affectively. She continues to have some intermittent nausea but no emesis. She takes prochlorperazine appropriately and affectively as needed, and requests a refill on that medication today.   REVIEW OF SYSTEMS: Lanay tells me "feels good".  She denies any fevers or chills. She's had no rashes, bruising, or abnormal bleeding. Her appetite is fair. She is keeping herself well hydrated, drinking 3-4 16 ounce bottles of  water daily. Her serum creatinine has been elevated and we are following that closely. She denies any dysuria or hematuria. She has no significant cough, and her shortness of breath is stable to improved. She's had no chest pain or palpitations. She denies any abnormal headaches or dizziness. Other than the pain in the right shoulder, she denies any new myalgias, arthralgias, or bony pain. The lymphedema continues to be an issue, and her right upper extremity is wrapped. She  no longer has any signs of swelling, however, in the lower extremities. She's had some slight numbness and tingling isolated to the right fingers, and likely associated with the lymphedema. She's had no additional signs of peripheral neuropathy elsewhere.    Otherwise a detailed review of systems is noncontributory.   PAST MEDICAL HISTORY: Past Medical History  Diagnosis Date  . Hypertension   . Hearing loss     right ear  . Hyperlipidemia   . Arthritis   . Dysrhythmia     A fib with IV chemo treatments  . Shortness of breath     with exertion  . Tuberculosis     years ago-had 1 year tx  . 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 Breast cancer     (Rt) breast ca dx 9/11  . 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 Neuropathy   . History of radiation therapy 01/18/11-03/07/11    5940 cGy to Right chest wall, axilla and supraclavicular region  . Lymphedema of arm 08/28/2013    PAST SURGICAL HISTORY: Past Surgical History  Procedure Laterality Date  . Tubal ligation    . Portacath placement Left   . Abdominal hysterectomy    . Lung biopsy    . Lesion excision      L anterior thigh  . Breast surgery  12/01/10    ER+,PR+,Ki 67 64%,Her2 -; right breast lumpectomy  . Chest tube insertion  08/08/2012    Procedure: INSERTION PLEURAL DRAINAGE CATHETER;  Surgeon: Alleen Borne, MD;  Location: [facility] OR;  Service: Thoracic;  Laterality: Right;  . Removal of pleural drainage catheter Right 10/10/2012    Procedure: REMOVAL OF PLEURAL DRAINAGE CATHETER;  Surgeon: Alleen Borne, MD;  Location: [facility] OR;  Service: Thoracic;  Laterality: Right;  . Mastectomy, radical Left 05/08/2013    Dr Carolynne Edouard  . Mastectomy modified radical Left 05/08/2013    Procedure: MASTECTOMY MODIFIED RADICAL;  Surgeon: Robyne Askew, MD;  Location: [facility] OR;  Service: General;  Laterality: Left;    FAMILY HISTORY No family history on file. The patient's father died in an automobile accident at age 52. The patient's mother died at  age 78 with Alzheimer's disease. The patient had 8 brothers and 4 sisters. There is no history of breast or ovarian cancer in the family.  GYNECOLOGIC HISTORY: Menarche age 53, first live birth age 51. She is GX P7. She underwent menopause "more than 20 years ago". She never took hormone replacement.  SOCIAL HISTORY:  (Updated November 2014) She has been mostly a housewife. Her husband [name] used to be a cook at [employer]. He completed radiation therapy for prostate cancer under [facility] April of 2014. The patient's children are from an earlier marriage, and include Kerri Perches, [place], [address], [address], Danielle [place], [place], and [employer]. The patient has 15 grandchildren and 1 great-grandchild. She attends [employer]  ADVANCED DIRECTIVES: Not in place  HEALTH MAINTENANCE: (Updated 07/04/2013)  History  Substance Use Topics  . Smoking status: Former Smoker    Quit date: 06/30/1973  . Smokeless tobacco: Never Used  . Alcohol Use: No     Colonoscopy:  Not on file  PAP: Not on file  Bone density:  August 2012, Normal  Lipid panel: Not on file   No Known Allergies  Current Outpatient Prescriptions  Medication Sig Dispense Refill  . Calcium Carbonate-Vitamin D 600-400 MG-UNIT per tablet Take 1 tablet by mouth every morning.       . carvedilol (COREG) 6.25 MG tablet Take 1 tablet (6.25 mg total) by mouth 2 (two) times daily with a meal. She takes with a 12.5 mg tablet to equal her dose of 18.75 mg.  60 tablet  6  . cyanocobalamin 500 MCG tablet Take 500 mcg by mouth daily with breakfast.       . ferrous sulfate 325 (65 FE) MG tablet Take 1 tablet (325 mg total) by mouth daily with breakfast.  30 tablet  1  . furosemide (LASIX) 20 MG tablet Take 1 tablet (20 mg total) by mouth daily.  3 tablet  0  . gabapentin (NEURONTIN) 300 MG capsule Take 300 mg by mouth at bedtime.       . ondansetron (ZOFRAN) 8 MG tablet Take 1 tablet (8 mg  total) by mouth every 8 (eight) hours as needed for nausea or vomiting. Take two times a day starting the day after chemo for 2 days. Then take two times a day as needed for nausea or vomiting.  30 tablet  1  . oxyCODONE (OXY IR/ROXICODONE) 5 MG immediate release tablet Take 1 tablet (5 mg total) by mouth every 4 (four) hours as needed for severe pain.  30 tablet  0  . OxyCODONE (OXYCONTIN) 20 mg T12A 12 hr tablet Take 1 tablet (20 mg total) by mouth every 12 (twelve) hours.  60 tablet  0  . polyethylene glycol (MIRALAX / GLYCOLAX) packet Take 17 g by mouth daily.  14 each  0  . prochlorperazine (COMPAZINE) 10 MG tablet Take 1 tablet (10 mg total) by mouth every 6 (six) hours as needed (Nausea or vomiting).  60 tablet  1  . senna (SENOKOT) 8.6 MG TABS tablet Take 1 tablet (8.6 mg total) by mouth daily as needed for mild constipation.  120 each  0  . tamoxifen (NOLVADEX) 20 MG tablet        No current facility-administered medications for this visit.    OBJECTIVE:  African American woman examined in a wheelchair  Filed Vitals:   09/04/13 1213  BP: 146/85  Pulse: 90  Temp: 98.7 F (37.1 C)  Resp: 18     Body mass index is 38.77 kg/(m^2).    ECOG FS: 2  Filed Weights   09/04/13 1213  Weight: 226 lb (102.513 kg)  Physical Exam: HEENT:  Sclerae anicteric.  Oropharynx clear and moist. No ulcerations and no evidence of oropharyngeal candidiasis  NODES:  No cervical or supraclavicular lymphadenopathy palpated.  BREAST EXAM: Patient is status post bilateral mastectomies with some palpable nodularity on the left chest wall which is stable, but no obvious skin changes. LUNGS:  Clear to auscultation bilaterally, but with slightly decreased breath sounds in the left base .  No wheezes or rhonchi HEART:  Regular rate and rhythm. No murmur  ABDOMEN:  Soft,  obese, nontender.  Positive bowel sounds.  MSK:  No focal spinal tenderness to palpation.  Limited range of motion bilaterally  in the upper  extremities do to excessive swelling.  EXTREMITIES:   lymphedema bilaterally in the upper extremities, grade 3. Right upper extremity is wrapped. No edema in the lower extremities SKIN:  Benign with no obvious skin changes or rashes. No nail dyscrasia NEURO:  Nonfocal. Well oriented.  Appropriate  affect.    LAB RESULTS: Lab Results  Component Value Date   WBC 6.0 08/28/2013   NEUTROABS 4.2 08/28/2013   HGB 9.3* 08/28/2013   HCT 28.6* 08/28/2013   MCV 90.6 08/28/2013   PLT 144* 08/28/2013      Chemistry      Component Value Date/Time   NA 133* 08/28/2013 1121   NA 129* 08/12/2013 0600   K 4.5 08/28/2013 1121   K 5.6* 08/12/2013 0600   CL 93* 08/12/2013 0600   CL 106 02/14/2013 1048   CO2 30* 08/28/2013 1121   CO2 33* 08/12/2013 0600   BUN 17.4 08/28/2013 1121   BUN 29* 08/12/2013 0600   CREATININE 1.9* 08/28/2013 1121   CREATININE 1.34* 08/12/2013 0600      Component Value Date/Time   CALCIUM 8.5 08/28/2013 1121   CALCIUM 8.5 08/12/2013 0600   ALKPHOS 115 08/28/2013 1121   ALKPHOS 57 08/12/2013 0600   AST 41* 08/28/2013 1121   AST 42* 08/12/2013 0600   ALT 27 08/28/2013 1121   ALT 19 08/12/2013 0600   BILITOT 0.34 08/28/2013 1121   BILITOT 0.5 08/12/2013 0600       Lab Results  Component Value Date   LABCA2 95* 08/14/2012    STUDIES: Ct Chest W Contrast  07/02/2013   CLINICAL DATA:  Restaging bilateral breast cancer diagnosed in 2011. Radiation therapy completed. Chemotherapy ongoing. Patient reports shortness of breath.  EXAM: CT CHEST WITH CONTRAST  TECHNIQUE: Multidetector CT imaging of the chest was performed during intravenous contrast administration.  CONTRAST:  70mL OMNIPAQUE IOHEXOL 300 MG/ML  SOLN  COMPARISON:  PET-CT today and chest CT 12/18/2012.  FINDINGS: Compared with the prior study, there is new diffuse subcutaneous edema throughout the upper chest and visualized upper arms. There is new ill-defined nodularity in the left supraclavicular area,  hypermetabolic on PET and worrisome for lymphadenopathy. A representative lesion measures 1.9 cm on image 1. There is also new nodularity anterior to the left pectoralis major muscle, measuring 1.7 cm on image 10, also hypermetabolic on PET. There is asymmetric nodular enlargement of the left pectoralis major muscle more inferiorly without well defined mass. Ill-defined increased nodal tissue is present within the left axilla. No right axillary masses are identified following lymph node dissection.  Asymmetric heterogeneous enlargement of the left thyroid lobe and resulting tracheal deviation to the right are stable, consistent with goiter. No enlarged mediastinal or hilar lymph nodes are identified. There is diffuse atherosclerosis of the aorta and coronary arteries.  There are enlarging moderate right greater than left pleural effusions with associated bibasilar atelectasis. No pulmonary nodules, endobronchial lesions or confluent airspace opacities are identified. There is minimal ground-glass opacity at the right apex measuring 10 mm on image 11.  Images through the upper abdomen again demonstrate extensive calcification throughout the gallbladder wall. No focal liver lesions are identified.  There is a lytic metastasis involving the left aspect of the T11 vertebral body. There are suspected metastases involving the upper right ribs  IMPRESSION: 1. Diffuse soft tissue edema throughout the upper chest and bilateral arms, likely in part secondary to radiation therapy. Superimposed lymphedema is possible. 2. Increasing ill-defined nodularity is noted  in the left supraclavicular, left axillary and left anterior chest wall regions worrisome for metastatic disease given the associated hypermetabolic activity on today's PET-CT. 3. No mediastinal adenopathy or pulmonary parenchymal metastases identified. 4. New bilateral pleural effusions and associated bibasilar atelectasis. The right effusion is potentially malignant  as correlated with today's PET-CT. 5. Osseous metastases involving the upper right ribs and T11 vertebral body. 6. Grossly stable goiter and porcelain gallbladder.   Electronically Signed   By: Camie Patience M.D.   On: 07/02/2013 15:57   Nm Pet Image Restag (ps) Skull Base To Thigh  07/02/2013   CLINICAL DATA:  Subsequent treatment strategy for breast cancer. Restaging bilateral breast cancer diagnosed in 2011. Radiation therapy completed. Chemotherapy ongoing. Patient reports shortness of breath.  EXAM: NUCLEAR MEDICINE PET SKULL BASE TO THIGH  FASTING BLOOD GLUCOSE:  Value: $Remov'104mg'YVYSrZ$ /dl  TECHNIQUE: 15.7 mCi F-18 FDG was injected intravenously. CT data was obtained and used for attenuation correction and anatomic localization only. (This was not acquired as a diagnostic CT examination.) Additional exam technical data entered on technologist worksheet.  COMPARISON:  Chest CT performed today and PET-CT 04/03/2012.  FINDINGS: The examination is mildly limited by body habitus.  NECK  There are multiple new ill-defined hypermetabolic left cervical lymph nodes, confluent in the left supraclavicular area. SUV max is up to 12.6. There are smaller hypermetabolic supraclavicular lymph nodes on the right. Asymmetric goiter is again noted without significantly increased metabolic activity or significant change.  CHEST  There is diffusely increased hypermetabolic activity in the left axilla corresponding with increased soft tissue density between the surgical clips worrisome for confluent lymphadenopathy. There is also hypermetabolic activity within the left pectoralis muscle which demonstrates ill-defined nodular enlargement. There are several subcutaneous nodules within the left anterior upper chest which are new and hypermetabolic. Hypermetabolic soft tissue activity in the upper right arm is without definite corresponding nodal disease and may reflect myositis or physiologic muscular activity.  No discrete hypermetabolic  mediastinal or hilar lymph nodes are identified. There is a new right pleural effusion with asymmetric hypermetabolic activity at the right apex, concerning for a malignant effusion. Some of this is likely due to underlying osseous metastatic disease in the ribs and upper thoracic spine.  ABDOMEN/PELVIS  Hepatic evaluation is limited by body habitus. No discrete hepatic metastases are identified. There is no evidence metastatic disease to the adrenal glands, spleen or pancreas. 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 There is ill-defined edema within the base of the mesentery with associated hypermetabolic activity. There is a hypermetabolic left external iliac node measuring 1.3 cm on image 179 (SUV max 9.4). No discrete enlarged abdominal lymph nodes or peritoneal nodules identified.  SKELETON  There is multifocal osseous activity consistent with metastatic disease. Asymmetric activity is present at the right lung apex, likely involving the upper right ribs and upper thoracic spine. There is a lytic hypermetabolic lesion involving the left aspect of the T11 vertebral body. Additional metastases are present within the lumbar spine and bony pelvis.  IMPRESSION: 1. Findings are consistent with extensive upper left chest wall recurrence with multiple hypermetabolic lymph nodes in the neck and left axilla. There is probable involvement of the left pectoralis musculature. 2. Multifocal osseous metastases involving the ribs, spine and pelvis. Spinal involvement is limited by body habitus. If the patient has focal neurologic symptoms, further evaluation with MRI may be helpful. 3. Probable malignant right pleural effusion. 4. Atypical central mesenteric hypermetabolic activity without obvious associated nodal mass. This could be  due to superimposed pancreatitis.   Electronically Signed   By: Camie Patience M.D.   On: 07/02/2013 15:54    ASSESSMENT: 68 y.o. [place] woman with stage 4 breast cancer  (1) status post  RIGHT axillary lymph node biopsy 05/27/2010 for a clinical T4 N1, stage IIIB (inflammatory) invasive ductal carcinoma, grade not stated, estrogen receptor 94% positive, progesterone receptor 64% positive, with an MIB-1 of 60%, and HER-2 amplification by FISH with a ratio of 7.5.  (2) neoadjuvant chemotherapy consisted of 6 cycles of standard carboplatin, docetaxel, trastuzumab, with the trastuzumab held cycles 5 and 6 due to a drop in her borderline ejection fraction (from 45-50% at baseline to 40-45% January 2012)-- total trastuzumab treatment = 2 months  (3) status post right mastectomy and axillary lymph node dissection 12/01/2010, showing no residual invasive carcinoma in the breast, but residual tumor in 20 of 21 axillary lymph nodes. (Only one of the 20 positive lymph nodes showed a significant residual tumor, measuring 5 mm; the remaining lymph nodes, including the evidence of extracapsular extension, showed single and clusters a few neoplastic cells with extensive therapy related changes).  (4) postmastectomy radiation to the right chest wall, axilla, and supraclavicular region completed 03/07/2011  (5) additional 2 months of trastuzumab given February through April 2013, again discontinued because of a drop in the ejection fraction and lateral S'  (6) on letrozole July 2012 to September 2013  (7) LEFT axillary adenopathy noted by Dr. Marlou Starks on exam July 2013, with biopsy of a left axillary node 04/17/2012 showing an invasive ductal carcinoma, estrogen receptor 91% positive, progesterone receptor 9% positive, with an MIB-1 of 45%, and no HER-2 amplification.   (8) PET scan 04/03/2012 showed multiple enlarged left axillary and small left supraclavicular lymph nodes that are moderately hypermetabolic, with some hypermetabolic activity within the left axillary tail. No other hypermetabolic lymph nodes identified. There was no involvement of the right axilla, mediastinum or right chest wall and no  extrathoracic metastases were demonstrated.  (8) on exemestane and everolimus September through December 2013  (9) s/p RIGHT thoracentesis 08/01/2012, cytologically positive; s/p temporary Pleurx; effusion resolved after Abraxane therapy  (10) Abraxane started 08/16/2012, given day 1 and day 8 of each 21 day cycle, completed 6 cycles (12 doses) 12/06/2012, with excellent response  (11) fulvestrant started 12/19/2012, stopped 04/11/2013 with progression  (12) trastuzumab resumed 12/21/2011. We scheduled the doses 4 weeks apart to see if they caould be better tolerated. However echo 02/04/2013 again showed a drop in the EF and S', so Herceptin stopped (last dose 01/17/2013). Most recent echo, 04/18/2013, shows the ejection fraction to have recovered.  (13) status post left modified radical mastectomy 05/08/2013 for a pT4, pN2a [tumor extended over 15 cm, all 6 sampled lymph nodes were involved) invasive lobular breast cancer, grade 3, which was HER-2 negative. It was estrogen receptor positive at 72%, progesterone receptor negative. Posterior margin was broadly positive  (14) tamoxifen started 05/29/2013, discontinued December 2014 with progression  (15) post-mastectomy irradiation to left chest wall and left [place] fossa planned, beginning 07/09/2013  (16) new baseline staging scans including a chest CT and PET scan were obtained 07/02/2013 suggesting progression of disease, but that is when compared to previous scans in August 2013. However in December of 2014 there was obvious disease progression on the chest wall  (17) Abraxane resumed 08/07/2013, the plan being for treatments day 1 and day 8 of every 21 day cycle, with restaging after 2 full cycles.  PLAN:  Briceyda will receive her day 8 cycle 2 dose of Abraxane today as scheduled, and will return tomorrow on day 9 for her Neulasta injection for granulocyte support. She'll see Dr. Jana Hakim next week for assessment of chemotoxicity and followup.  She's also scheduled for restaging scans on January 16 to assess response to chemotherapy thus far.   I will plan on seeing her the following week on January 21 when she returns for day 1 cycle 3. At that time we'll review her scan results, and also review her treatment plan. We will continue to follow Tyrina very closely, and she and [name] know to call if there are any changes or problems prior to her next scheduled appointment.    Shakir Petrosino, PA-C     09/04/2013

## 2013-09-04 NOTE — Progress Notes (Signed)
We will treat Jeanette Morris today with a slightly low ANC of 1.3. We'll also continue to follow her very closely for her anemia, today with a hemoglobin of 8.5. She is already scheduled to return next week and we will go ahead and draw a type and hold at that appointment. If her hemoglobin has dropped further, or if she is more symptomatic of her anemia, we may need to consider blood transfusion at that time.  I have spoken to her infusion nurse, Cloyd Stagers, who will relay these instructions to [place]. I also asked that Caria contact us if she feels increasingly weak or tired, or has an increased amount of shortness of breath.  Micah Flesher, PA-C 09/04/2013

## 2013-09-04 NOTE — Progress Notes (Signed)
Per Dr. Jana Hakim, okay to tx with WBC-2.4, Hgb-8.5, ANC-1.3

## 2013-09-04 NOTE — Patient Instructions (Signed)
[facility] Discharge Instructions for Patients Receiving Chemotherapy  Today you received the following chemotherapy agents: abraxane  To help prevent nausea and vomiting after your treatment, we encourage you to take your nausea medication.  Take it as often as prescribed.     If you develop nausea and vomiting that is not controlled by your nausea medication, call the clinic. If it is after clinic hours your family physician or the after hours number for the clinic or go to the Emergency Department.   BELOW ARE SYMPTOMS THAT SHOULD BE REPORTED IMMEDIATELY:  *FEVER GREATER THAN 100.5 F  *CHILLS WITH OR WITHOUT FEVER  NAUSEA AND VOMITING THAT IS NOT CONTROLLED WITH YOUR NAUSEA MEDICATION  *UNUSUAL SHORTNESS OF BREATH  *UNUSUAL BRUISING OR BLEEDING  TENDERNESS IN MOUTH AND THROAT WITH OR WITHOUT PRESENCE OF ULCERS  *URINARY PROBLEMS  *BOWEL PROBLEMS  UNUSUAL RASH Items with * indicate a potential emergency and should be followed up as soon as possible.  Feel free to call the clinic you have any questions or concerns. The clinic phone number is [phone].   I have been informed and understand all the instructions given to me. I know to contact the clinic, my physician, or go to the Emergency Department if any problems should occur. I do not have any questions at this time, but understand that I may call the clinic during office hours   should I have any questions or need assistance in obtaining follow up care.    __________________________________________  _____________  __________ Signature of Patient or Authorized Representative            Date                   Time    __________________________________________ Nurse's Signature   

## 2013-09-05 ENCOUNTER — Ambulatory Visit: Payer: PRIVATE HEALTH INSURANCE

## 2013-09-05 ENCOUNTER — Telehealth: Payer: Self-pay | Admitting: Oncology

## 2013-09-05 ENCOUNTER — Telehealth: Payer: Self-pay | Admitting: *Deleted

## 2013-09-05 ENCOUNTER — Other Ambulatory Visit: Payer: PRIVATE HEALTH INSURANCE

## 2013-09-05 ENCOUNTER — Ambulatory Visit: Payer: PRIVATE HEALTH INSURANCE | Admitting: Oncology

## 2013-09-05 ENCOUNTER — Ambulatory Visit (HOSPITAL_BASED_OUTPATIENT_CLINIC_OR_DEPARTMENT_OTHER): Payer: PRIVATE HEALTH INSURANCE

## 2013-09-05 VITALS — BP 179/88 | HR 98 | Temp 98.2°F

## 2013-09-05 DIAGNOSIS — C7951 Secondary malignant neoplasm of bone: Secondary | ICD-10-CM

## 2013-09-05 DIAGNOSIS — C50619 Malignant neoplasm of axillary tail of unspecified female breast: Secondary | ICD-10-CM

## 2013-09-05 DIAGNOSIS — C7952 Secondary malignant neoplasm of bone marrow: Secondary | ICD-10-CM

## 2013-09-05 DIAGNOSIS — C773 Secondary and unspecified malignant neoplasm of axilla and upper limb lymph nodes: Secondary | ICD-10-CM

## 2013-09-05 DIAGNOSIS — J910 Malignant pleural effusion: Secondary | ICD-10-CM

## 2013-09-05 DIAGNOSIS — C50411 Malignant neoplasm of upper-outer quadrant of right female breast: Secondary | ICD-10-CM

## 2013-09-05 DIAGNOSIS — C50412 Malignant neoplasm of upper-outer quadrant of left female breast: Secondary | ICD-10-CM

## 2013-09-05 DIAGNOSIS — C50919 Malignant neoplasm of unspecified site of unspecified female breast: Secondary | ICD-10-CM

## 2013-09-05 MED ORDER — PEGFILGRASTIM INJECTION 6 MG/0.6ML
6.0000 mg | Freq: Once | SUBCUTANEOUS | Status: AC
  Administered 2013-09-05: 6 mg via SUBCUTANEOUS
  Filled 2013-09-05: qty 0.6

## 2013-09-05 NOTE — Patient Instructions (Signed)

## 2013-09-05 NOTE — Telephone Encounter (Signed)
Per staff phone I have adjusted 1/21 appt

## 2013-09-06 ENCOUNTER — Ambulatory Visit: Payer: PRIVATE HEALTH INSURANCE

## 2013-09-09 ENCOUNTER — Ambulatory Visit: Payer: PRIVATE HEALTH INSURANCE

## 2013-09-09 ENCOUNTER — Encounter: Payer: Self-pay | Admitting: Oncology

## 2013-09-10 ENCOUNTER — Ambulatory Visit: Payer: PRIVATE HEALTH INSURANCE | Admitting: Physical Therapy

## 2013-09-12 ENCOUNTER — Ambulatory Visit (HOSPITAL_BASED_OUTPATIENT_CLINIC_OR_DEPARTMENT_OTHER): Payer: PRIVATE HEALTH INSURANCE | Admitting: Oncology

## 2013-09-12 ENCOUNTER — Ambulatory Visit: Payer: PRIVATE HEALTH INSURANCE

## 2013-09-12 ENCOUNTER — Other Ambulatory Visit (HOSPITAL_BASED_OUTPATIENT_CLINIC_OR_DEPARTMENT_OTHER): Payer: PRIVATE HEALTH INSURANCE

## 2013-09-12 ENCOUNTER — Ambulatory Visit (HOSPITAL_COMMUNITY)
Admission: RE | Admit: 2013-09-12 | Discharge: 2013-09-12 | Disposition: A | Discharge status: Home / Self Care | Payer: PRIVATE HEALTH INSURANCE | Source: Ambulatory Visit | Attending: Oncology | Admitting: Oncology

## 2013-09-12 ENCOUNTER — Ambulatory Visit (HOSPITAL_BASED_OUTPATIENT_CLINIC_OR_DEPARTMENT_OTHER): Payer: PRIVATE HEALTH INSURANCE

## 2013-09-12 VITALS — BP 146/84 | HR 96 | Temp 98.6°F | Resp 18 | Ht 64.0 in | Wt 218.8 lb

## 2013-09-12 DIAGNOSIS — D649 Anemia, unspecified: Secondary | ICD-10-CM

## 2013-09-12 DIAGNOSIS — J90 Pleural effusion, not elsewhere classified: Secondary | ICD-10-CM

## 2013-09-12 DIAGNOSIS — C773 Secondary and unspecified malignant neoplasm of axilla and upper limb lymph nodes: Secondary | ICD-10-CM

## 2013-09-12 DIAGNOSIS — C50411 Malignant neoplasm of upper-outer quadrant of right female breast: Secondary | ICD-10-CM

## 2013-09-12 DIAGNOSIS — C50919 Malignant neoplasm of unspecified site of unspecified female breast: Secondary | ICD-10-CM

## 2013-09-12 DIAGNOSIS — Z95828 Presence of other vascular implants and grafts: Secondary | ICD-10-CM

## 2013-09-12 DIAGNOSIS — C50619 Malignant neoplasm of axillary tail of unspecified female breast: Secondary | ICD-10-CM

## 2013-09-12 DIAGNOSIS — C50419 Malignant neoplasm of upper-outer quadrant of unspecified female breast: Secondary | ICD-10-CM | POA: Insufficient documentation

## 2013-09-12 DIAGNOSIS — J910 Malignant pleural effusion: Secondary | ICD-10-CM

## 2013-09-12 DIAGNOSIS — C7952 Secondary malignant neoplasm of bone marrow: Secondary | ICD-10-CM

## 2013-09-12 DIAGNOSIS — D630 Anemia in neoplastic disease: Secondary | ICD-10-CM

## 2013-09-12 DIAGNOSIS — C50412 Malignant neoplasm of upper-outer quadrant of left female breast: Secondary | ICD-10-CM

## 2013-09-12 DIAGNOSIS — C7951 Secondary malignant neoplasm of bone: Secondary | ICD-10-CM

## 2013-09-12 DIAGNOSIS — Z170 Estrogen receptor positive status [ER+]: Secondary | ICD-10-CM

## 2013-09-12 DIAGNOSIS — Z452 Encounter for adjustment and management of vascular access device: Secondary | ICD-10-CM

## 2013-09-12 LAB — CBC WITH DIFFERENTIAL/PLATELET
BASO%: 1 % (ref 0.0–2.0)
BASOS ABS: 0.1 10*3/uL (ref 0.0–0.1)
EOS ABS: 0.1 10*3/uL (ref 0.0–0.5)
EOS%: 0.7 % (ref 0.0–7.0)
HEMATOCRIT: 25 % — AB (ref 34.8–46.6)
HEMOGLOBIN: 8.2 g/dL — AB (ref 11.6–15.9)
LYMPH%: 11.8 % — AB (ref 14.0–49.7)
MCH: 29.7 pg (ref 25.1–34.0)
MCHC: 32.7 g/dL (ref 31.5–36.0)
MCV: 90.8 fL (ref 79.5–101.0)
MONO#: 0.8 10*3/uL (ref 0.1–0.9)
MONO%: 9.8 % (ref 0.0–14.0)
NEUT%: 76.7 % (ref 38.4–76.8)
NEUTROS ABS: 6.3 10*3/uL (ref 1.5–6.5)
PLATELETS: 243 10*3/uL (ref 145–400)
RBC: 2.75 10*6/uL — ABNORMAL LOW (ref 3.70–5.45)
RDW: 14.1 % (ref 11.2–14.5)
WBC: 8.2 10*3/uL (ref 3.9–10.3)
lymph#: 1 10*3/uL (ref 0.9–3.3)

## 2013-09-12 LAB — COMPREHENSIVE METABOLIC PANEL (CC13)
ALK PHOS: 106 U/L (ref 40–150)
ALT: 19 U/L (ref 0–55)
AST: 23 U/L (ref 5–34)
Albumin: 2.7 g/dL — ABNORMAL LOW (ref 3.5–5.0)
Anion Gap: 6 mEq/L (ref 3–11)
BILIRUBIN TOTAL: 0.3 mg/dL (ref 0.20–1.20)
BUN: 12.4 mg/dL (ref 7.0–26.0)
CO2: 31 mEq/L — ABNORMAL HIGH (ref 22–29)
CREATININE: 1.4 mg/dL — AB (ref 0.6–1.1)
Calcium: 8.7 mg/dL (ref 8.4–10.4)
Chloride: 97 mEq/L — ABNORMAL LOW (ref 98–109)
GLUCOSE: 118 mg/dL (ref 70–140)
Potassium: 4.5 mEq/L (ref 3.5–5.1)
SODIUM: 134 meq/L — AB (ref 136–145)
TOTAL PROTEIN: 6.9 g/dL (ref 6.4–8.3)

## 2013-09-12 LAB — HOLD TUBE, BLOOD BANK

## 2013-09-12 MED ORDER — HEPARIN SOD (PORK) LOCK FLUSH 100 UNIT/ML IV SOLN
500.0000 [IU] | Freq: Once | INTRAVENOUS | Status: AC
  Administered 2013-09-12: 500 [IU] via INTRAVENOUS
  Filled 2013-09-12: qty 5

## 2013-09-12 MED ORDER — SODIUM CHLORIDE 0.9 % IJ SOLN
10.0000 mL | INTRAMUSCULAR | Status: DC | PRN
  Administered 2013-09-12: 10 mL via INTRAVENOUS
  Filled 2013-09-12: qty 10

## 2013-09-12 NOTE — Progress Notes (Signed)
ID: Jeanette Morris   DOB: [birth year]  MR#: [id number]  CSN#:[id number]  PCP: [name], NP GYN:  [facility] Age OTHER MD: Glori Bickers, Gery Pray, Arvid Right  CHIEF COMPLAINT:  Metastatic Breast Cancer   HISTORY OF PRESENT ILLNESS: The patient undergoes annual screening mammography.  Her mammograms previously had fullness.  In August 2011 she presented with some firmness of her right breast. She also noted some skin thickening around her nipple and some tenderness.  She was subsequently seen for this on 05/13/2010 and was referred for bilateral mammogram and right breast ultrasound.  There was concern that this may be mastitis.  There were diffuse skin thickening, tissue edema, enlarged lymph node in right axilla, some subareolar distortion secondary to the previous surgery.  She was given a course of doxycycline for 10 days and asked to return. A follow-up ultrasound of the breast showed persistent abnormalities.  She was referred for biopsy.  She underwent biopsy of the right axilla on 05/27/2010 and it showed metastatic carcinoma consistent with breast primary.  This was ER and PR positive, HER-2 was positive with a ratio of 3.09.  The ER ratio is 34%, PR was 64%, proliferative index 60%.  She subsequently had a MRI scan of both breasts on 06/03/2010 and this showed a area of minimal enhancement in the right breast measuring 10.0 x 6.0 x 3.0 cm.  Enlarged lymph nodes were seen in the right axilla.  Largest node measuring 3.0 x 1.0 x 7.0 cm.  Intramammary lymph nodes were also seen in the outer left breast, the largest measuring 2.8 x 2.5 cm.  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   The patient's subsequent history is as detailed below.  INTERVAL HISTORY: Jeanette Morris returns today accompanied by her husband [name] for followup of her metastatic breast cancer. She's currently being treated with Abraxane, given on days 1  and 8 of each 21 day cycle, with Neulasta on day 9. Today is day 16 cycle 2.  REVIEW OF SYSTEMS: [name] is tolerating chemotherapy well. She has minimal nausea, which apparently is not so much related to the treatment as to sensitization. Certain foods or even the path of oxygen she gets from her portable make her a little bit nauseated. She gags but does not vomit. She feels tired but she is not more tired after chemotherapy than before. She has minimal peripheral neuropathy symptoms which may be related more to the swelling of her arm and then to actual neuropathy. She is currently undergoing physical therapy on Tuesdays and Thursdays. Bowel movements are fine. She has good bladder control. She has had no significant pain, no fever, and no bleeding. She does have some arthritis symptoms here in there they have not changed in intensity or frequency. A detailed review of systems today was otherwise stable   PAST MEDICAL HISTORY: Past Medical History  Diagnosis Date  . Hypertension   . Hearing loss     right ear  . Hyperlipidemia   . Arthritis   . Dysrhythmia     A fib with IV chemo treatments  . Shortness of breath     with exertion  . Tuberculosis     years ago-had 1 year tx  . 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 Breast cancer     (Rt) breast ca dx 9/11  . 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  Neuropathy   . History of radiation therapy 01/18/11-03/07/11    5940 cGy to Right chest wall, axilla and supraclavicular region  . Lymphedema of arm 08/28/2013    PAST SURGICAL HISTORY: Past Surgical History  Procedure Laterality Date  . Tubal ligation    . Portacath placement Left   . Abdominal hysterectomy    . Lung biopsy    . Lesion excision      L anterior thigh  . Breast surgery  12/01/10    ER+,PR+,Ki 67 64%,Her2 -; right breast lumpectomy  . Chest tube insertion  08/08/2012    Procedure: INSERTION PLEURAL DRAINAGE CATHETER;  Surgeon: Gaye Pollack, MD;  Location: [place] [facility];  Service:  Thoracic;  Laterality: Right;  . Removal of pleural drainage catheter Right 10/10/2012    Procedure: REMOVAL OF PLEURAL DRAINAGE CATHETER;  Surgeon: Gaye Pollack, MD;  Location: [place];  Service: Thoracic;  Laterality: Right;  . Mastectomy, radical Left 05/08/2013    Dr Marlou Starks  . Mastectomy modified radical Left 05/08/2013    Procedure: MASTECTOMY MODIFIED RADICAL;  Surgeon: Merrie Roof, MD;  Location: [place];  Service: General;  Laterality: Left;    FAMILY HISTORY No family history on file. The patient's father died in an automobile accident at age 80. The patient's mother died at age 62 with Alzheimer's disease. The patient had 8 brothers and 4 sisters. There is no history of breast or ovarian cancer in the family.  GYNECOLOGIC HISTORY: Menarche age 68, first live birth age 62. She is GX P7. She underwent menopause "more than 20 years ago". She never took hormone replacement.  SOCIAL HISTORY:  (Updated November 2014) She has been mostly a housewife. Her husband [name] used to be a cook at [employer]. He completed radiation therapy for prostate cancer under Arloa Koh April of 2014. The patient's children are from an earlier marriage, and include Evlyn Kanner, [place], [place], [place], Danielle [place], [place], and [employer]. The patient has 15 grandchildren and 1 great-grandchild. She attends [employer]  ADVANCED DIRECTIVES: Not in place  HEALTH MAINTENANCE: (Updated 07/04/2013) History  Substance Use Topics  . Smoking status: Former Smoker    Quit date: 06/30/1973  . Smokeless tobacco: Never Used  . Alcohol Use: No     Colonoscopy:  Not on file  PAP: Not on file  Bone density:  August 2012, Normal  Lipid panel: Not on file   No Known Allergies    Objective: Middle-aged [place] American woman examined in a wheelchair. She is wearing a nasal cannula in carrying a portable oxygen tach    Filed Vitals:   09/12/13 1331  BP:  146/84  Pulse: 96  Temp: 98.6 F (37 C)  Resp: 18     Body mass index is 37.54 kg/(m^2).    ECOG FS: 2  Filed Weights   09/12/13 1331  Weight: 218 lb 12.8 oz (99.247 kg)   Sclerae unicteric, bilateral arcus senilis Oropharynx clear and moist-- no thrush No cervical or supraclavicular adenopathy Lungs no rales or rhonchi Heart regular rate and rhythm Abd soft, nontender, positive bowel sounds MSK no focal spinal tenderness, no upper extremity lymphedema Neuro: nonfocal, well oriented, appropriate affect Breasts: Deferred     LAB RESULTS: Lab Results  Component Value Date   WBC 8.2 09/12/2013   NEUTROABS 6.3 09/12/2013   HGB 8.2* 09/12/2013   HCT 25.0* 09/12/2013   MCV 90.8 09/12/2013   PLT 243 09/12/2013  Chemistry      Component Value Date/Time   NA 134* 09/04/2013 1117   NA 129* 08/12/2013 0600   K 4.7 09/04/2013 1117   K 5.6* 08/12/2013 0600   CL 93* 08/12/2013 0600   CL 106 02/14/2013 1048   CO2 30* 09/04/2013 1117   CO2 33* 08/12/2013 0600   BUN 17.1 09/04/2013 1117   BUN 29* 08/12/2013 0600   CREATININE 1.1 09/04/2013 1117   CREATININE 1.34* 08/12/2013 0600      Component Value Date/Time   CALCIUM 8.8 09/04/2013 1117   CALCIUM 8.5 08/12/2013 0600   ALKPHOS 128 09/04/2013 1117   ALKPHOS 57 08/12/2013 0600   AST 63* 09/04/2013 1117   AST 42* 08/12/2013 0600   ALT 45 09/04/2013 1117   ALT 19 08/12/2013 0600   BILITOT 0.50 09/04/2013 1117   BILITOT 0.5 08/12/2013 0600       Lab Results  Component Value Date   LABCA2 95* 08/14/2012    STUDIES: No results found.  ASSESSMENT: 68 y.o.  woman with stage IV breast cancer  (1) status post RIGHT axillary lymph node biopsy 05/27/2010 for a clinical T4 N1, stage IIIB (inflammatory) invasive ductal carcinoma, grade not stated, estrogen receptor 94% positive, progesterone receptor 64% positive, with an MIB-1 of 60%, and HER-2 amplification by [facility] with a ratio of 7.5.  (2) neoadjuvant chemotherapy consisted of 6  cycles of standard carboplatin, docetaxel, trastuzumab, with the trastuzumab held cycles 5 and 6 due to a drop in her borderline ejection fraction (from 45-50% at baseline to 40-45% January 2012)-- total trastuzumab treatment = 2 months  (3) status post right mastectomy and axillary lymph node dissection 12/01/2010, showing no residual invasive carcinoma in the breast, but residual tumor in 20 of 21 axillary lymph nodes. (Only one of the 20 positive lymph nodes showed a significant residual tumor, measuring 5 mm; the remaining lymph nodes, including the evidence of extracapsular extension, showed single and clusters a few neoplastic cells with extensive therapy related changes).  (4) postmastectomy radiation to the right chest wall, axilla, and supraclavicular region completed 03/07/2011  (5) additional 2 months of trastuzumab given February through April 2013, again discontinued because of a drop in the ejection fraction and lateral S'  (6) on letrozole July 2012 to September 2013  (7) LEFT axillary adenopathy noted by Dr. Marlou Starks on exam July 2013, with biopsy of a left axillary node 04/17/2012 showing an invasive ductal carcinoma, estrogen receptor 91% positive, progesterone receptor 9% positive, with an MIB-1 of 45%, and no HER-2 amplification.   (8) PET scan 04/03/2012 showed multiple enlarged left axillary and small left supraclavicular lymph nodes that are moderately hypermetabolic, with some hypermetabolic activity within the left axillary tail. No other hypermetabolic lymph nodes identified. There was no involvement of the right axilla, mediastinum or right chest wall and no extrathoracic metastases were demonstrated.  (8) on exemestane and everolimus September through December 2013  (9) s/p RIGHT thoracentesis 08/01/2012, cytologically positive; s/p temporary Pleurx; effusion resolved after Abraxane therapy  (10) Abraxane started 08/16/2012, given day 1 and day 8 of each 21 day cycle,  completed 6 cycles (12 doses) 12/06/2012, with excellent response  (11) fulvestrant started 12/19/2012, stopped 04/11/2013 with progression  (12) trastuzumab resumed 12/21/2011. We scheduled the doses 4 weeks apart to see if they caould be better tolerated. However echo 02/04/2013 again showed a drop in the EF and S', so Herceptin stopped (last dose 01/17/2013). Most recent echo, 04/18/2013, shows the ejection fraction to  have recovered.  (13) status post left modified radical mastectomy 05/08/2013 for a pT4, pN2a [tumor extended over 15 cm, all 6 sampled lymph nodes were involved) invasive lobular breast cancer, grade 3, which was HER-2 negative. It was estrogen receptor positive at 72%, progesterone receptor negative. Posterior margin was broadly positive  (14) tamoxifen started 05/29/2013, discontinued December 2014 with progression  (15) post-mastectomy irradiation to left chest wall and left [place] fossa planned, beginning 07/09/2013  (16) new baseline staging scans including a chest CT and PET scan were obtained 07/02/2013 suggesting progression of disease, but that is when compared to previous scans in August 2013. However in December of 2014 there was obvious disease progression on the chest wall  (17) Abraxane resumed 08/07/2013, the plan being for treatments day 1 and day 8 of every 21 day cycle, with restaging after 2 full cycles.  PLAN:  Mykiah is tolerating the treatments well. The question is whether they are working. She has a CT scan tomorrow which will help [place] answer that question.  Her anemia is progressing and I have set her up for transfusion tomorrow. If we do that after the CT scan and I will be able to give her the results.  Otherwise she will return next week hopefully for the beginning of cycle 3. If we are having a good response the plan will be to do 3 additional cycles before repeating a CT scan.  Allysa has a good understanding of this plan. She agrees with that. She  knows a goal of treatment is control. She will call with any problems that may develop before her next visit here.  Chauncey Cruel, MD     09/12/2013

## 2013-09-12 NOTE — Patient Instructions (Signed)

## 2013-09-13 ENCOUNTER — Ambulatory Visit (HOSPITAL_BASED_OUTPATIENT_CLINIC_OR_DEPARTMENT_OTHER): Payer: PRIVATE HEALTH INSURANCE

## 2013-09-13 ENCOUNTER — Encounter (HOSPITAL_COMMUNITY)
Admission: RE | Admit: 2013-09-13 | Discharge: 2013-09-13 | Disposition: A | Discharge status: Home / Self Care | Payer: PRIVATE HEALTH INSURANCE | Source: Ambulatory Visit | Attending: Physician Assistant | Admitting: Physician Assistant

## 2013-09-13 ENCOUNTER — Encounter (HOSPITAL_COMMUNITY): Payer: Self-pay

## 2013-09-13 ENCOUNTER — Ambulatory Visit (HOSPITAL_COMMUNITY)
Admission: RE | Admit: 2013-09-13 | Discharge: 2013-09-13 | Disposition: A | Discharge status: Home / Self Care | Payer: PRIVATE HEALTH INSURANCE | Source: Ambulatory Visit | Attending: Physician Assistant | Admitting: Physician Assistant

## 2013-09-13 VITALS — BP 137/80 | HR 76 | Temp 98.4°F | Resp 16

## 2013-09-13 DIAGNOSIS — I2510 Atherosclerotic heart disease of native coronary artery without angina pectoris: Secondary | ICD-10-CM | POA: Insufficient documentation

## 2013-09-13 DIAGNOSIS — S2239XA Fracture of one rib, unspecified side, initial encounter for closed fracture: Secondary | ICD-10-CM | POA: Insufficient documentation

## 2013-09-13 DIAGNOSIS — C50412 Malignant neoplasm of upper-outer quadrant of left female breast: Secondary | ICD-10-CM

## 2013-09-13 DIAGNOSIS — C50919 Malignant neoplasm of unspecified site of unspecified female breast: Secondary | ICD-10-CM | POA: Insufficient documentation

## 2013-09-13 DIAGNOSIS — C50419 Malignant neoplasm of upper-outer quadrant of unspecified female breast: Secondary | ICD-10-CM | POA: Insufficient documentation

## 2013-09-13 DIAGNOSIS — J910 Malignant pleural effusion: Secondary | ICD-10-CM

## 2013-09-13 DIAGNOSIS — C7952 Secondary malignant neoplasm of bone marrow: Secondary | ICD-10-CM

## 2013-09-13 DIAGNOSIS — J90 Pleural effusion, not elsewhere classified: Secondary | ICD-10-CM | POA: Insufficient documentation

## 2013-09-13 DIAGNOSIS — C50411 Malignant neoplasm of upper-outer quadrant of right female breast: Secondary | ICD-10-CM

## 2013-09-13 DIAGNOSIS — I517 Cardiomegaly: Secondary | ICD-10-CM | POA: Insufficient documentation

## 2013-09-13 DIAGNOSIS — I890 Lymphedema, not elsewhere classified: Secondary | ICD-10-CM | POA: Insufficient documentation

## 2013-09-13 DIAGNOSIS — D649 Anemia, unspecified: Secondary | ICD-10-CM

## 2013-09-13 DIAGNOSIS — S42023A Displaced fracture of shaft of unspecified clavicle, initial encounter for closed fracture: Secondary | ICD-10-CM | POA: Insufficient documentation

## 2013-09-13 DIAGNOSIS — C7951 Secondary malignant neoplasm of bone: Secondary | ICD-10-CM | POA: Insufficient documentation

## 2013-09-13 DIAGNOSIS — C779 Secondary and unspecified malignant neoplasm of lymph node, unspecified: Secondary | ICD-10-CM

## 2013-09-13 DIAGNOSIS — X58XXXA Exposure to other specified factors, initial encounter: Secondary | ICD-10-CM | POA: Insufficient documentation

## 2013-09-13 DIAGNOSIS — Z79899 Other long term (current) drug therapy: Secondary | ICD-10-CM | POA: Insufficient documentation

## 2013-09-13 DIAGNOSIS — M799 Soft tissue disorder, unspecified: Secondary | ICD-10-CM | POA: Insufficient documentation

## 2013-09-13 DIAGNOSIS — E042 Nontoxic multinodular goiter: Secondary | ICD-10-CM | POA: Insufficient documentation

## 2013-09-13 DIAGNOSIS — C770 Secondary and unspecified malignant neoplasm of lymph nodes of head, face and neck: Secondary | ICD-10-CM | POA: Insufficient documentation

## 2013-09-13 LAB — GLUCOSE, CAPILLARY: Glucose-Capillary: 112 mg/dL — ABNORMAL HIGH (ref 70–99)

## 2013-09-13 LAB — PREPARE RBC (CROSSMATCH)

## 2013-09-13 MED ORDER — FLUDEOXYGLUCOSE F - 18 (FDG) INJECTION
16.4000 | Freq: Once | INTRAVENOUS | Status: AC | PRN
  Administered 2013-09-13: 16.4 via INTRAVENOUS

## 2013-09-13 MED ORDER — IOHEXOL 300 MG/ML  SOLN
80.0000 mL | Freq: Once | INTRAMUSCULAR | Status: AC | PRN
  Administered 2013-09-13: 80 mL via INTRAVENOUS

## 2013-09-13 MED ORDER — SODIUM CHLORIDE 0.9 % IV SOLN
250.0000 mL | Freq: Once | INTRAVENOUS | Status: AC
  Administered 2013-09-13: 250 mL via INTRAVENOUS

## 2013-09-13 NOTE — Patient Instructions (Signed)
Blood Transfusion Information WHAT IS A BLOOD TRANSFUSION? A transfusion is the replacement of blood or some of its parts. Blood is made up of multiple cells which provide different functions.  Red blood cells carry oxygen and are used for blood loss replacement.  White blood cells fight against infection.  Platelets control bleeding.  Plasma helps clot blood.  Other blood products are available for specialized needs, such as hemophilia or other clotting disorders. BEFORE THE TRANSFUSION  Who gives blood for transfusions?   You may be able to donate blood to be used at a later date on yourself (autologous donation).  Relatives can be asked to donate blood. This is generally not any safer than if you have received blood from a stranger. The same precautions are taken to ensure safety when a relative's blood is donated.  Healthy volunteers who are fully evaluated to make sure their blood is safe. This is blood bank blood. Transfusion therapy is the safest it has ever been in the practice of medicine. Before blood is taken from a donor, a complete history is taken to make sure that person has no history of diseases nor engages in risky social behavior (examples are intravenous drug use or sexual activity with multiple partners). The donor's travel history is screened to minimize risk of transmitting infections, such as malaria. The donated blood is tested for signs of infectious diseases, such as HIV and hepatitis. The blood is then tested to be sure it is compatible with you in order to minimize the chance of a transfusion reaction. If you or a relative donates blood, this is often done in anticipation of surgery and is not appropriate for emergency situations. It takes many days to process the donated blood. RISKS AND COMPLICATIONS Although transfusion therapy is very safe and saves many lives, the main dangers of transfusion include:   Getting an infectious disease.  Developing a  transfusion reaction. This is an allergic reaction to something in the blood you were given. Every precaution is taken to prevent this. The decision to have a blood transfusion has been considered carefully by your caregiver before blood is given. Blood is not given unless the benefits outweigh the risks. AFTER THE TRANSFUSION  Right after receiving a blood transfusion, you will usually feel much better and more energetic. This is especially true if your red blood cells have gotten low (anemic). The transfusion raises the level of the red blood cells which carry oxygen, and this usually causes an energy increase.  The nurse administering the transfusion will monitor you carefully for complications. HOME CARE INSTRUCTIONS  No special instructions are needed after a transfusion. You may find your energy is better. Speak with your caregiver about any limitations on activity for underlying diseases you may have. SEEK MEDICAL CARE IF:   Your condition is not improving after your transfusion.  You develop redness or irritation at the intravenous (IV) site. SEEK IMMEDIATE MEDICAL CARE IF:  Any of the following symptoms occur over the next 12 hours:  Shaking chills.  You have a temperature by mouth above 102 F (38.9 C), not controlled by medicine.  Chest, back, or muscle pain.  People around you feel you are not acting correctly or are confused.  Shortness of breath or difficulty breathing.  Dizziness and fainting.  You get a rash or develop hives.  You have a decrease in urine output.  Your urine turns a dark color or changes to pink, red, or brown. Any of the following   symptoms occur over the next 10 days:  You have a temperature by mouth above 102 F (38.9 C), not controlled by medicine.  Shortness of breath.  Weakness after normal activity.  The white part of the eye turns yellow (jaundice).  You have a decrease in the amount of urine or are urinating less often.  Your  urine turns a dark color or changes to pink, red, or brown. Document Released: 08/12/2000 Document Revised: 11/07/2011 Document Reviewed: 03/31/2008 Exit[employer] Patient Information 2014 [employer].  

## 2013-09-14 ENCOUNTER — Ambulatory Visit (HOSPITAL_BASED_OUTPATIENT_CLINIC_OR_DEPARTMENT_OTHER): Payer: PRIVATE HEALTH INSURANCE

## 2013-09-14 VITALS — BP 160/84 | HR 79 | Temp 97.8°F | Resp 18

## 2013-09-14 DIAGNOSIS — C50411 Malignant neoplasm of upper-outer quadrant of right female breast: Secondary | ICD-10-CM

## 2013-09-14 DIAGNOSIS — C7951 Secondary malignant neoplasm of bone: Secondary | ICD-10-CM

## 2013-09-14 DIAGNOSIS — C50919 Malignant neoplasm of unspecified site of unspecified female breast: Secondary | ICD-10-CM

## 2013-09-14 DIAGNOSIS — J90 Pleural effusion, not elsewhere classified: Secondary | ICD-10-CM

## 2013-09-14 DIAGNOSIS — C50412 Malignant neoplasm of upper-outer quadrant of left female breast: Secondary | ICD-10-CM

## 2013-09-14 DIAGNOSIS — J910 Malignant pleural effusion: Secondary | ICD-10-CM

## 2013-09-14 DIAGNOSIS — D649 Anemia, unspecified: Secondary | ICD-10-CM

## 2013-09-14 MED ORDER — HEPARIN SOD (PORK) LOCK FLUSH 100 UNIT/ML IV SOLN
500.0000 [IU] | Freq: Every day | INTRAVENOUS | Status: AC | PRN
  Administered 2013-09-14: 500 [IU]
  Filled 2013-09-14: qty 5

## 2013-09-14 MED ORDER — SODIUM CHLORIDE 0.9 % IV SOLN
250.0000 mL | Freq: Once | INTRAVENOUS | Status: AC
  Administered 2013-09-14: 250 mL via INTRAVENOUS

## 2013-09-14 MED ORDER — SODIUM CHLORIDE 0.9 % IJ SOLN
10.0000 mL | INTRAMUSCULAR | Status: AC | PRN
Start: 2013-09-14 — End: 2013-09-14
  Administered 2013-09-14: 10 mL
  Filled 2013-09-14: qty 10

## 2013-09-14 NOTE — Patient Instructions (Signed)
Blood Transfusion Information WHAT IS A BLOOD TRANSFUSION? A transfusion is the replacement of blood or some of its parts. Blood is made up of multiple cells which provide different functions.  Red blood cells carry oxygen and are used for blood loss replacement.  White blood cells fight against infection.  Platelets control bleeding.  Plasma helps clot blood.  Other blood products are available for specialized needs, such as hemophilia or other clotting disorders. BEFORE THE TRANSFUSION  Who gives blood for transfusions?   You may be able to donate blood to be used at a later date on yourself (autologous donation).  Relatives can be asked to donate blood. This is generally not any safer than if you have received blood from a stranger. The same precautions are taken to ensure safety when a relative's blood is donated.  Healthy volunteers who are fully evaluated to make sure their blood is safe. This is blood bank blood. Transfusion therapy is the safest it has ever been in the practice of medicine. Before blood is taken from a donor, a complete history is taken to make sure that person has no history of diseases nor engages in risky social behavior (examples are intravenous drug use or sexual activity with multiple partners). The donor's travel history is screened to minimize risk of transmitting infections, such as malaria. The donated blood is tested for signs of infectious diseases, such as HIV and hepatitis. The blood is then tested to be sure it is compatible with you in order to minimize the chance of a transfusion reaction. If you or a relative donates blood, this is often done in anticipation of surgery and is not appropriate for emergency situations. It takes many days to process the donated blood. RISKS AND COMPLICATIONS Although transfusion therapy is very safe and saves many lives, the main dangers of transfusion include:   Getting an infectious disease.  Developing a  transfusion reaction. This is an allergic reaction to something in the blood you were given. Every precaution is taken to prevent this. The decision to have a blood transfusion has been considered carefully by your caregiver before blood is given. Blood is not given unless the benefits outweigh the risks. AFTER THE TRANSFUSION  Right after receiving a blood transfusion, you will usually feel much better and more energetic. This is especially true if your red blood cells have gotten low (anemic). The transfusion raises the level of the red blood cells which carry oxygen, and this usually causes an energy increase.  The nurse administering the transfusion will monitor you carefully for complications. HOME CARE INSTRUCTIONS  No special instructions are needed after a transfusion. You may find your energy is better. Speak with your caregiver about any limitations on activity for underlying diseases you may have. SEEK MEDICAL CARE IF:   Your condition is not improving after your transfusion.  You develop redness or irritation at the intravenous (IV) site. SEEK IMMEDIATE MEDICAL CARE IF:  Any of the following symptoms occur over the next 12 hours:  Shaking chills.  You have a temperature by mouth above 102 F (38.9 C), not controlled by medicine.  Chest, back, or muscle pain.  People around you feel you are not acting correctly or are confused.  Shortness of breath or difficulty breathing.  Dizziness and fainting.  You get a rash or develop hives.  You have a decrease in urine output.  Your urine turns a dark color or changes to pink, red, or brown. Any of the following   symptoms occur over the next 10 days:  You have a temperature by mouth above 102 F (38.9 C), not controlled by medicine.  Shortness of breath.  Weakness after normal activity.  The white part of the eye turns yellow (jaundice).  You have a decrease in the amount of urine or are urinating less often.  Your  urine turns a dark color or changes to pink, red, or brown. Document Released: 08/12/2000 Document Revised: 11/07/2011 Document Reviewed: 03/31/2008 Exit[employer] Patient Information 2014 [employer].  

## 2013-09-16 LAB — TYPE AND SCREEN
ABO/RH(D): A POS
ANTIBODY SCREEN: NEGATIVE
UNIT DIVISION: 0
Unit division: 0

## 2013-09-17 ENCOUNTER — Telehealth: Payer: Self-pay | Admitting: *Deleted

## 2013-09-17 ENCOUNTER — Ambulatory Visit: Payer: PRIVATE HEALTH INSURANCE

## 2013-09-17 ENCOUNTER — Emergency Department (HOSPITAL_COMMUNITY): Payer: PRIVATE HEALTH INSURANCE

## 2013-09-17 ENCOUNTER — Observation Stay (HOSPITAL_COMMUNITY)
Admission: EM | Admit: 2013-09-17 | Discharge: 2013-09-23 | Disposition: A | Discharge status: Home / Self Care | Payer: PRIVATE HEALTH INSURANCE | Attending: Family Medicine | Admitting: Family Medicine

## 2013-09-17 ENCOUNTER — Encounter (HOSPITAL_COMMUNITY): Payer: Self-pay | Admitting: Emergency Medicine

## 2013-09-17 DIAGNOSIS — Z87891 Personal history of nicotine dependence: Secondary | ICD-10-CM | POA: Diagnosis not present

## 2013-09-17 DIAGNOSIS — J90 Pleural effusion, not elsewhere classified: Secondary | ICD-10-CM | POA: Diagnosis not present

## 2013-09-17 DIAGNOSIS — C50919 Malignant neoplasm of unspecified site of unspecified female breast: Secondary | ICD-10-CM

## 2013-09-17 DIAGNOSIS — R110 Nausea: Secondary | ICD-10-CM

## 2013-09-17 DIAGNOSIS — J189 Pneumonia, unspecified organism: Secondary | ICD-10-CM

## 2013-09-17 DIAGNOSIS — E785 Hyperlipidemia, unspecified: Secondary | ICD-10-CM | POA: Insufficient documentation

## 2013-09-17 DIAGNOSIS — T451X5A Adverse effect of antineoplastic and immunosuppressive drugs, initial encounter: Secondary | ICD-10-CM | POA: Diagnosis not present

## 2013-09-17 DIAGNOSIS — R209 Unspecified disturbances of skin sensation: Secondary | ICD-10-CM | POA: Diagnosis not present

## 2013-09-17 DIAGNOSIS — J910 Malignant pleural effusion: Secondary | ICD-10-CM

## 2013-09-17 DIAGNOSIS — I428 Other cardiomyopathies: Secondary | ICD-10-CM | POA: Diagnosis not present

## 2013-09-17 DIAGNOSIS — R109 Unspecified abdominal pain: Principal | ICD-10-CM

## 2013-09-17 DIAGNOSIS — G5600 Carpal tunnel syndrome, unspecified upper limb: Secondary | ICD-10-CM | POA: Diagnosis not present

## 2013-09-17 DIAGNOSIS — Z9010 Acquired absence of unspecified breast and nipple: Secondary | ICD-10-CM | POA: Diagnosis not present

## 2013-09-17 DIAGNOSIS — C773 Secondary and unspecified malignant neoplasm of axilla and upper limb lymph nodes: Secondary | ICD-10-CM | POA: Diagnosis not present

## 2013-09-17 DIAGNOSIS — I427 Cardiomyopathy due to drug and external agent: Secondary | ICD-10-CM

## 2013-09-17 DIAGNOSIS — I10 Essential (primary) hypertension: Secondary | ICD-10-CM | POA: Diagnosis not present

## 2013-09-17 DIAGNOSIS — I509 Heart failure, unspecified: Secondary | ICD-10-CM | POA: Diagnosis not present

## 2013-09-17 DIAGNOSIS — I4581 Long QT syndrome: Secondary | ICD-10-CM | POA: Diagnosis not present

## 2013-09-17 DIAGNOSIS — L989 Disorder of the skin and subcutaneous tissue, unspecified: Secondary | ICD-10-CM

## 2013-09-17 DIAGNOSIS — N1330 Unspecified hydronephrosis: Secondary | ICD-10-CM | POA: Diagnosis not present

## 2013-09-17 DIAGNOSIS — I890 Lymphedema, not elsewhere classified: Secondary | ICD-10-CM

## 2013-09-17 DIAGNOSIS — D72829 Elevated white blood cell count, unspecified: Secondary | ICD-10-CM

## 2013-09-17 DIAGNOSIS — C7951 Secondary malignant neoplasm of bone: Secondary | ICD-10-CM | POA: Insufficient documentation

## 2013-09-17 DIAGNOSIS — C7952 Secondary malignant neoplasm of bone marrow: Secondary | ICD-10-CM

## 2013-09-17 DIAGNOSIS — Z79899 Other long term (current) drug therapy: Secondary | ICD-10-CM | POA: Insufficient documentation

## 2013-09-17 DIAGNOSIS — C50419 Malignant neoplasm of upper-outer quadrant of unspecified female breast: Secondary | ICD-10-CM | POA: Diagnosis not present

## 2013-09-17 DIAGNOSIS — C50412 Malignant neoplasm of upper-outer quadrant of left female breast: Secondary | ICD-10-CM

## 2013-09-17 DIAGNOSIS — C50411 Malignant neoplasm of upper-outer quadrant of right female breast: Secondary | ICD-10-CM

## 2013-09-17 DIAGNOSIS — R7989 Other specified abnormal findings of blood chemistry: Secondary | ICD-10-CM

## 2013-09-17 DIAGNOSIS — I429 Cardiomyopathy, unspecified: Secondary | ICD-10-CM

## 2013-09-17 DIAGNOSIS — N179 Acute kidney failure, unspecified: Secondary | ICD-10-CM

## 2013-09-17 LAB — COMPREHENSIVE METABOLIC PANEL
ALT: 11 U/L (ref 0–35)
AST: 27 U/L (ref 0–37)
Albumin: 2.7 g/dL — ABNORMAL LOW (ref 3.5–5.2)
Alkaline Phosphatase: 100 U/L (ref 39–117)
BUN: 12 mg/dL (ref 6–23)
CALCIUM: 9 mg/dL (ref 8.4–10.5)
CO2: 30 mEq/L (ref 19–32)
CREATININE: 1.37 mg/dL — AB (ref 0.50–1.10)
Chloride: 94 mEq/L — ABNORMAL LOW (ref 96–112)
GFR calc non Af Amer: 38 mL/min — ABNORMAL LOW (ref >90)
GFR, EST AFRICAN AMERICAN: 44 mL/min — AB (ref >90)
GLUCOSE: 132 mg/dL — AB (ref 70–99)
Potassium: 5.2 mEq/L (ref 3.7–5.3)
SODIUM: 133 meq/L — AB (ref 137–147)
TOTAL PROTEIN: 7 g/dL (ref 6.0–8.3)
Total Bilirubin: 0.2 mg/dL — ABNORMAL LOW (ref 0.3–1.2)

## 2013-09-17 LAB — URINALYSIS, ROUTINE W REFLEX MICROSCOPIC
Bilirubin Urine: NEGATIVE
Glucose, UA: NEGATIVE mg/dL
HGB URINE DIPSTICK: NEGATIVE
Ketones, ur: NEGATIVE mg/dL
LEUKOCYTES UA: NEGATIVE
NITRITE: NEGATIVE
Protein, ur: NEGATIVE mg/dL
SPECIFIC GRAVITY, URINE: 1.014 (ref 1.005–1.030)
UROBILINOGEN UA: 0.2 mg/dL (ref 0.0–1.0)
pH: 6 (ref 5.0–8.0)

## 2013-09-17 LAB — CBC
HCT: 33.4 % — ABNORMAL LOW (ref 36.0–46.0)
HEMOGLOBIN: 10.9 g/dL — AB (ref 12.0–15.0)
MCH: 29.3 pg (ref 26.0–34.0)
MCHC: 32.6 g/dL (ref 30.0–36.0)
MCV: 89.8 fL (ref 78.0–100.0)
PLATELETS: 234 10*3/uL (ref 150–400)
RBC: 3.72 MIL/uL — ABNORMAL LOW (ref 3.87–5.11)
RDW: 15.3 % (ref 11.5–15.5)
WBC: 11.8 10*3/uL — ABNORMAL HIGH (ref 4.0–10.5)

## 2013-09-17 LAB — CG4 I-STAT (LACTIC ACID): LACTIC ACID, VENOUS: 0.51 mmol/L (ref 0.5–2.2)

## 2013-09-17 LAB — LIPASE, BLOOD: Lipase: 22 U/L (ref 11–59)

## 2013-09-17 MED ORDER — IOHEXOL 300 MG/ML  SOLN
100.0000 mL | Freq: Once | INTRAMUSCULAR | Status: AC | PRN
  Administered 2013-09-17: 100 mL via INTRAVENOUS

## 2013-09-17 MED ORDER — OXYCODONE HCL ER 20 MG PO T12A
20.0000 mg | EXTENDED_RELEASE_TABLET | Freq: Two times a day (BID) | ORAL | Status: DC
  Administered 2013-09-17 – 2013-09-23 (×12): 20 mg via ORAL
  Filled 2013-09-17 (×7): qty 1
  Filled 2013-09-17: qty 2
  Filled 2013-09-17 (×4): qty 1

## 2013-09-17 MED ORDER — GABAPENTIN 300 MG PO CAPS
300.0000 mg | ORAL_CAPSULE | Freq: Every day | ORAL | Status: DC
  Administered 2013-09-17 – 2013-09-22 (×6): 300 mg via ORAL
  Filled 2013-09-17 (×7): qty 1

## 2013-09-17 MED ORDER — CARVEDILOL 6.25 MG PO TABS
18.7500 mg | ORAL_TABLET | Freq: Two times a day (BID) | ORAL | Status: DC
  Administered 2013-09-18 – 2013-09-23 (×11): 18.75 mg via ORAL
  Filled 2013-09-17: qty 3
  Filled 2013-09-17 (×12): qty 1

## 2013-09-17 MED ORDER — CALCIUM CARBONATE-VITAMIN D 500-200 MG-UNIT PO TABS
1.0000 | ORAL_TABLET | Freq: Every morning | ORAL | Status: DC
  Administered 2013-09-18 – 2013-09-23 (×6): 1 via ORAL
  Filled 2013-09-17 (×7): qty 1

## 2013-09-17 MED ORDER — MORPHINE SULFATE 4 MG/ML IJ SOLN
4.0000 mg | Freq: Once | INTRAMUSCULAR | Status: AC
  Administered 2013-09-17: 4 mg via INTRAVENOUS
  Filled 2013-09-17: qty 1

## 2013-09-17 MED ORDER — SODIUM CHLORIDE 0.9 % IV SOLN
INTRAVENOUS | Status: DC
  Administered 2013-09-17 – 2013-09-20 (×4): via INTRAVENOUS

## 2013-09-17 MED ORDER — ONDANSETRON HCL 4 MG PO TABS
8.0000 mg | ORAL_TABLET | Freq: Three times a day (TID) | ORAL | Status: DC | PRN
  Administered 2013-09-18 – 2013-09-21 (×6): 8 mg via ORAL
  Filled 2013-09-17 (×7): qty 2

## 2013-09-17 MED ORDER — HEPARIN SODIUM (PORCINE) 5000 UNIT/ML IJ SOLN
5000.0000 [IU] | Freq: Three times a day (TID) | INTRAMUSCULAR | Status: DC
  Administered 2013-09-17 – 2013-09-23 (×18): 5000 [IU] via SUBCUTANEOUS
  Filled 2013-09-17 (×20): qty 1

## 2013-09-17 MED ORDER — FERROUS SULFATE 325 (65 FE) MG PO TABS
325.0000 mg | ORAL_TABLET | Freq: Every day | ORAL | Status: DC
  Administered 2013-09-18 – 2013-09-23 (×6): 325 mg via ORAL
  Filled 2013-09-17 (×7): qty 1

## 2013-09-17 MED ORDER — ONDANSETRON HCL 4 MG/2ML IJ SOLN
4.0000 mg | Freq: Once | INTRAMUSCULAR | Status: AC
  Administered 2013-09-17: 4 mg via INTRAVENOUS
  Filled 2013-09-17: qty 2

## 2013-09-17 MED ORDER — HYDROMORPHONE HCL PF 1 MG/ML IJ SOLN
1.0000 mg | Freq: Once | INTRAMUSCULAR | Status: AC
  Administered 2013-09-17: 1 mg via INTRAVENOUS
  Filled 2013-09-17: qty 1

## 2013-09-17 MED ORDER — CYANOCOBALAMIN 500 MCG PO TABS
500.0000 ug | ORAL_TABLET | Freq: Every day | ORAL | Status: DC
  Administered 2013-09-18 – 2013-09-23 (×6): 500 ug via ORAL
  Filled 2013-09-17 (×8): qty 1

## 2013-09-17 MED ORDER — POLYETHYLENE GLYCOL 3350 17 G PO PACK
17.0000 g | PACK | Freq: Every day | ORAL | Status: DC
  Administered 2013-09-18 – 2013-09-23 (×4): 17 g via ORAL
  Filled 2013-09-17 (×6): qty 1

## 2013-09-17 MED ORDER — POTASSIUM CHLORIDE CRYS ER 20 MEQ PO TBCR
20.0000 meq | EXTENDED_RELEASE_TABLET | Freq: Every day | ORAL | Status: DC
  Administered 2013-09-18 – 2013-09-23 (×6): 20 meq via ORAL
  Filled 2013-09-17 (×6): qty 1

## 2013-09-17 MED ORDER — HYDROMORPHONE HCL PF 1 MG/ML IJ SOLN
1.0000 mg | INTRAMUSCULAR | Status: DC | PRN
  Administered 2013-09-17 – 2013-09-21 (×16): 1 mg via INTRAVENOUS
  Administered 2013-09-21: 2 mg via INTRAVENOUS
  Administered 2013-09-22 – 2013-09-23 (×5): 1 mg via INTRAVENOUS
  Administered 2013-09-23: 2 mg via INTRAVENOUS
  Administered 2013-09-23: 1 mg via INTRAVENOUS
  Filled 2013-09-17 (×6): qty 1
  Filled 2013-09-17: qty 2
  Filled 2013-09-17 (×5): qty 1
  Filled 2013-09-17: qty 2
  Filled 2013-09-17 (×9): qty 1
  Filled 2013-09-17: qty 2
  Filled 2013-09-17 (×2): qty 1

## 2013-09-17 MED ORDER — SODIUM CHLORIDE 0.9 % IJ SOLN
3.0000 mL | Freq: Two times a day (BID) | INTRAMUSCULAR | Status: DC
  Administered 2013-09-19 – 2013-09-23 (×6): 3 mL via INTRAVENOUS

## 2013-09-17 NOTE — Progress Notes (Signed)
   CARE MANAGEMENT ED NOTE 09/17/2013  Patient:  Jeanette Morris, Jeanette Morris   Account Number:  [id number]  Date Initiated:  09/17/2013  Documentation initiated by:  Livia Snellen  Subjective/Objective Assessment:   Patient presents today from via EMS with a chief complaint of LUQ abdominal pain that has been present since Sunday 09/15/13     Subjective/Objective Assessment Detail:   patient with history of breast cancer     Action/Plan:   Action/Plan Detail:   Anticipated DC Date:       Status Recommendation to Physician:   Result of Recommendation:    Other ED Services  Consult [employer]  Other  CM consult    Choice offered to / List presented to:            Status of service:  Completed, signed off  ED Comments:   ED Comments Detail:  EDCM consulted to see patinet regarding home health needs. Patient reports she lives at home with her husband and her daughter.  Patient has an aide from the agency [employer] who helps with cookink, cleaning and ADL's.  Patient's daughter does the laundry.  Patient has a walker, cane and shower chair at home.  Patient also wears oxygen at home continuously at 2 liters.  Patient receives her oxygen from [facility].  Patient's husband's phone number is [phone].  No further EDCM needs at this time.

## 2013-09-17 NOTE — ED Provider Notes (Signed)
CSN: [id number]     Arrival date & time 09/17/13  1529 History   First MD Initiated Contact with Patient 09/17/13 1552     Chief Complaint  Patient presents with  . Abdominal Pain   (Consider location/radiation/quality/duration/timing/severity/associated sxs/prior Treatment) Patient is a 68 y.o. female presenting with abdominal pain. The history is provided by the patient.  Abdominal Pain Pain location:  L flank and LUQ Pain quality: sharp   Pain radiates to:  Does not radiate Pain severity:  Moderate Onset quality:  Sudden Timing:  Constant Progression:  Worsening Chronicity:  New Context: not awakening from sleep, not eating, not sick contacts and not trauma   Relieved by:  Lying down Worsened by:  Movement Ineffective treatments:  None tried Associated symptoms: nausea and vomiting   Associated symptoms: no chest pain, no chills, no cough, no diarrhea and no fever     Past Medical History  Diagnosis Date  . Hypertension   . Hearing loss     right ear  . Hyperlipidemia   . Arthritis   . Dysrhythmia     A fib with IV chemo treatments  . Shortness of breath     with exertion  . Tuberculosis     years ago-had 1 year tx  . 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 Breast cancer     (Rt) breast ca dx 9/11  . 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 Neuropathy   . History of radiation therapy 01/18/11-03/07/11    5940 cGy to Right chest wall, axilla and supraclavicular region  . Lymphedema of arm 08/28/2013   Past Surgical History  Procedure Laterality Date  . Tubal ligation    . Portacath placement Left   . Abdominal hysterectomy    . Lung biopsy    . Lesion excision      L anterior thigh  . Breast surgery  12/01/10    ER+,PR+,Ki 67 64%,Her2 -; right breast lumpectomy  . Chest tube insertion  08/08/2012    Procedure: INSERTION PLEURAL DRAINAGE CATHETER;  Surgeon: Gaye Pollack, MD;  Location: [place] [facility];  Service: Thoracic;  Laterality: Right;  . Removal of pleural drainage  catheter Right 10/10/2012    Procedure: REMOVAL OF PLEURAL DRAINAGE CATHETER;  Surgeon: Gaye Pollack, MD;  Location: [place];  Service: Thoracic;  Laterality: Right;  . Mastectomy, radical Left 05/08/2013    Dr Marlou Starks  . Mastectomy modified radical Left 05/08/2013    Procedure: MASTECTOMY MODIFIED RADICAL;  Surgeon: Merrie Roof, MD;  Location: [place];  Service: General;  Laterality: Left;   No family history on file. History  Substance Use Topics  . Smoking status: Former Smoker    Quit date: 06/30/1973  . Smokeless tobacco: Never Used  . Alcohol Use: No   OB History   Grav Para Term Preterm Abortions TAB SAB Ect Mult Living                 Review of Systems  Constitutional: Negative for fever and chills.  Respiratory: Negative for cough.   Cardiovascular: Negative for chest pain.  Gastrointestinal: Positive for nausea, vomiting and abdominal pain. Negative for diarrhea.  All other systems reviewed and are negative.    Allergies  Review of patient's allergies indicates no known allergies.  Home Medications   Current Outpatient Rx  Name  Route  Sig  Dispense  Refill  . Calcium Carbonate-Vitamin D 600-400 MG-UNIT per tablet  Oral   Take 1 tablet by mouth every morning.          . carvedilol (COREG) 6.25 MG tablet   Oral   Take 1 tablet (6.25 mg total) by mouth 2 (two) times daily with a meal. She takes with a 12.5 mg tablet to equal her dose of 18.75 mg.   60 tablet   6   . cyanocobalamin 500 MCG tablet   Oral   Take 500 mcg by mouth daily with breakfast.          . ferrous sulfate 325 (65 FE) MG tablet   Oral   Take 1 tablet (325 mg total) by mouth daily with breakfast.   30 tablet   1   . gabapentin (NEURONTIN) 300 MG capsule   Oral   Take 300 mg by mouth at bedtime.          . ondansetron (ZOFRAN) 8 MG tablet   Oral   Take 1 tablet (8 mg total) by mouth every 8 (eight) hours as needed for nausea or vomiting. Take two times a day starting the day  after chemo for 2 days. Then take two times a day as needed for nausea or vomiting.   30 tablet   1   . oxyCODONE (OXY IR/ROXICODONE) 5 MG immediate release tablet   Oral   Take 1 tablet (5 mg total) by mouth every 4 (four) hours as needed for severe pain.   30 tablet   0   . OxyCODONE (OXYCONTIN) 20 mg T12A 12 hr tablet   Oral   Take 1 tablet (20 mg total) by mouth every 12 (twelve) hours.   60 tablet   0   . polyethylene glycol (MIRALAX / GLYCOLAX) packet   Oral   Take 17 g by mouth daily.   14 each   0   . potassium chloride SA (K-DUR,KLOR-CON) 20 MEQ tablet   Oral   Take 20 mEq by mouth daily.          Marland Kitchen PRESCRIPTION MEDICATION      PACLitaxel-protein bound (ABRAXANE) chemo infusion 225 mg 100 mg/m2  2.18 m2 (Treatment Plan Actual)  Once 09/04/2013          BP 167/80  Pulse 66  Temp(Src) 98.7 F (37.1 C) (Oral)  Resp 18  SpO2 99% Physical Exam  Nursing note and vitals reviewed. Constitutional: She is oriented to person, place, and time. She appears well-developed and well-nourished. No distress.  HENT:  Head: Normocephalic and atraumatic.  Eyes: EOM are normal. Pupils are equal, round, and reactive to light.  Neck: Normal range of motion. Neck supple.  Cardiovascular: Normal rate and regular rhythm.  Exam reveals no friction rub.   No murmur heard. Pulmonary/Chest: Effort normal and breath sounds normal. No respiratory distress. She has no wheezes. She has no rales.  Abdominal: Soft. She exhibits no distension. There is tenderness (LUQ, L flank). There is no rebound.  Musculoskeletal: Normal range of motion. She exhibits no edema.  Neurological: She is alert and oriented to person, place, and time.  Skin: She is not diaphoretic.    ED Course  Procedures (including critical care time) Labs Review Labs Reviewed  CBC  COMPREHENSIVE METABOLIC PANEL  LIPASE, BLOOD  URINALYSIS, ROUTINE W REFLEX MICROSCOPIC   Imaging Review Dg Chest 2 View  09/17/2013    CLINICAL DATA:  Left upper quadrant pain.  EXAM: CHEST  2 VIEW  COMPARISON:  08/09/2013  FINDINGS: Port-A-Cath tip in  the upper SVC and in a stable horizontal orientation. Evidence for small bilateral pleural effusions. Subtle densities in the right upper lung could represent asymmetric airspace disease. Heart size is upper limits of normal but unchanged. Surgical clips in the axilla bilaterally. 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  IMPRESSION: Small bilateral pleural effusions.  Subtle lung densities, particularly on the right side. Findings could represent mild edema.   Electronically Signed   By: Markus Daft M.D.   On: 09/17/2013 17:16   Ct Abdomen Pelvis W Contrast  09/17/2013   CLINICAL DATA:  Left-sided abdominal pain. Vomiting. Breast carcinoma.  EXAM: CT ABDOMEN AND PELVIS WITH CONTRAST  TECHNIQUE: Multidetector CT imaging of the abdomen and pelvis was performed using the standard protocol following bolus administration of intravenous contrast.  CONTRAST:  174m OMNIPAQUE IOHEXOL 300 MG/ML  SOLN  COMPARISON:  PET-CT on 09/13/2013  FINDINGS: Images through the lung bases show no significant change in small to moderate bilateral pleural effusions.  Mild diffuse biliary ductal dilatation is again seen as well as porcelain gallbladder. No evidence of acute cholecystitis. No liver masses are identified. The pancreas, spleen, adrenal glands, and left kidney are normal in appearance. Severe right hydronephrosis is again demonstrated, however no obstructing calculus or mass is visualized, and this is stable since previous study. A congenital right UPJ obstruction cannot be excluded.  No abdominal or pelvic soft tissue masses or lymphadenopathy identified. No evidence of acute inflammatory process or abnormal fluid collections. Colonic diverticulosis noted, however there is no evidence of diverticulitis. Tiny amount of free fluid noted in pelvic cul-de-sac, which is  nonspecific. Mild diffuse body wall edema noted.  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 No other suspicious bone lesions identified.  IMPRESSION: No acute findings within the abdomen or pelvis.  Stable severe right hydronephrosis. Etiology not apparent by CT. Congenital UPJ obstruction cannot be excluded.  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   Electronically Signed   By: JEarle GellM.D.   On: 09/17/2013 18:57    EKG Interpretation    Date/Time:    Ventricular Rate:    PR Interval:    QRS Duration:   QT Interval:    QTC Calculation:   R Axis:     Text Interpretation:              MDM   1. Abdominal pain    68year old female presents as sharp left-sided abdominal pain. Began 2 days ago. It is worse with movement round, relieved with rest. Associated nausea and vomiting. His vomiting twice a day, NElmyra Rickstimes yesterday. Denies any fever. Denies any urinary symptoms, hematuria, dysuria. Denies any diarrhea. She's had no cough, shortness breath, chest pain. Of note she did have a thoracentesis previously for pleural effusions due to her metastatic breast cancer. She's currently on chemotherapy for metastatic breast cancer. Vitals are stable. She has left-sided belly pain on exam pulse my hand away during exam. Her lungs are clear. She is on Lanoxin chronically. We will check a chest x-ray and CT scan of her abdomen. Also check basic labs. Fluids, pain medicine, anti-emetics given.  Labs normal. Patient's CT is normal. Patient still in pain after IV pain meds and nausea medications. Patient stated she spoke with her oncologist instructed her to come in for admission. I spoke with the on-call oncologist who stated  that they like her admitted and he can see her in the morning. Hospitalist admitting.  Osvaldo Shipper, MD 09/18/13 [phone]

## 2013-09-17 NOTE — ED Notes (Signed)
Patient presents today from via EMS with a chief complaint of LUQ abdominal pain that has been present since Sunday 09/15/13. Patient reports associated symptoms of nausea and vomiting and has taken oxycodone 5mg  without relief. Patient reports last bowel movement was today at 1400 and was normal.

## 2013-09-17 NOTE — H&P (Signed)
Hospitalist Admission History and Physical  Patient name: Jeanette Morris Medical record number: [id number] Date of birth: 1945-12-16 Age: 68 y.o. Gender: female  Primary Care Provider: [name], NP  Chief Complaint: abdominal pain, metastatic breast cancer  History of Present Illness:This is a 68 y.o. year old female with prior history of metastatic breast cancer s/p mastectomy, on chemotherapy, chemotherapy induced cardiomyopathy, chronic pleural effusions presents with severe lower abdominal pain. Pt states that she has had progressively LLQ abdominal pain over the past 2-3 days that came to the point of being intractable despite home po narcotics. Pt has had some secondary nausea. No fevers, chills, vomiting, diarrhea, dysuria. Has been able to hold down some liquids.  Pt contacted her oncologist, Dr. Jana Hakim about the issue. Office contact recommended pt come to ER for further evaluation.  In the ER, a CT abd and pelvis was obtained showing stable R sided hydronephrosis,  Lytic bone lesions in lower thoracic spine and bilateral ribs-negative for any acute findings. CXR showed small bilateral pleural effusions and stable lung densities on R in comparison to previous imaging. WBC @ 11.8. Cr @ 1.37. hgb @ 10.9.  UA non indicative of infection. Heme-Onc was consulted by EDP with recs for inpt admission and am consult.    Patient Active Problem List   Diagnosis Date Noted  . Abdominal pain 09/17/2013  . Elevated serum creatinine 09/04/2013  . Breast cancer metastasized to multiple sites 09/04/2013  . Lymphedema of arm 08/28/2013  . Pneumonia 08/03/2013  . CHF exacerbation 08/03/2013  . Pleural effusion 08/03/2013  . Breast cancer metastasized to bone 07/04/2013  . Breast cancer of upper-outer quadrant of left female breast 06/17/2013  . Breast cancer of upper-outer quadrant of right female breast 05/28/2013  . Malignant pleural effusion 08/07/2012  . Pleural effusion on right  07/31/2012  . AKI (acute kidney injury) 07/31/2012  . Healthcare-associated pneumonia 07/29/2012  . Cardiomyopathy 04/20/2012  . 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 Skin lesion of left lower limb 07/26/2011  . Essential hypertension, benign 04/04/2011   Past Medical History: Past Medical History  Diagnosis Date  . Hypertension   . Hearing loss     right ear  . Hyperlipidemia   . Arthritis   . Dysrhythmia     A fib with IV chemo treatments  . Shortness of breath     with exertion  . Tuberculosis     years ago-had 1 year tx  . 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 Breast cancer     (Rt) breast ca dx 9/11  . 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 Neuropathy   . History of radiation therapy 01/18/11-03/07/11    5940 cGy to Right chest wall, axilla and supraclavicular region  . Lymphedema of arm 08/28/2013    Past Surgical History: Past Surgical History  Procedure Laterality Date  . Tubal ligation    . Portacath placement Left   . Abdominal hysterectomy    . Lung biopsy    . Lesion excision      L anterior thigh  . Breast surgery  12/01/10    ER+,PR+,Ki 67 64%,Her2 -; right breast lumpectomy  . Chest tube insertion  08/08/2012    Procedure: INSERTION PLEURAL DRAINAGE CATHETER;  Surgeon: Gaye Pollack, MD;  Location: [place] [facility];  Service: Thoracic;  Laterality: Right;  . Removal of pleural drainage catheter Right 10/10/2012    Procedure: REMOVAL OF PLEURAL DRAINAGE CATHETER;  Surgeon:  Gaye Pollack, MD;  Location: [place];  Service: Thoracic;  Laterality: Right;  . Mastectomy, radical Left 05/08/2013    Dr Marlou Starks  . Mastectomy modified radical Left 05/08/2013    Procedure: MASTECTOMY MODIFIED RADICAL;  Surgeon: Merrie Roof, MD;  Location: [place] [facility];  Service: General;  Laterality: Left;    Social History: History   Social History  . Marital Status: Married    Spouse Name: N/A    Number of Children: N/A  . Years of Education: N/A   Social History Main Topics  .  Smoking status: Former Smoker    Quit date: 06/30/1973  . Smokeless tobacco: Never Used  . Alcohol Use: No  . Drug Use: No  . Sexual Activity: None   Other Topics Concern  . None   Social History Narrative  . None    Family History: History reviewed. No pertinent family history.  Allergies: No Known Allergies  Current Facility-Administered Medications  Medication Dose Route Frequency Provider Last Rate Last Dose  . 0.9 %  sodium chloride infusion   Intravenous Continuous Shanda Howells, MD      . Derrill Memo ON 09/18/2013] Calcium Carbonate-Vitamin D 600-400 MG-UNIT 1 tablet  1 tablet Oral q morning - 10a Shanda Howells, MD      . Derrill Memo ON 09/18/2013] carvedilol (COREG) tablet 6.25 mg  6.25 mg Oral BID WC Shanda Howells, MD      . Derrill Memo ON 09/18/2013] cyanocobalamin tablet 500 mcg  500 mcg Oral Q breakfast Shanda Howells, MD      . Derrill Memo ON 09/18/2013] ferrous sulfate tablet 325 mg  325 mg Oral Q breakfast Shanda Howells, MD      . gabapentin (NEURONTIN) capsule 300 mg  300 mg Oral QHS Shanda Howells, MD      . heparin injection 5,000 Units  5,000 Units Subcutaneous Q8H Shanda Howells, MD      . HYDROmorphone (DILAUDID) injection 1-2 mg  1-2 mg Intravenous Q2H PRN Shanda Howells, MD      . ondansetron [facility]) tablet 8 mg  8 mg Oral Q8H PRN Shanda Howells, MD      . OxyCODONE (OXYCONTIN) 12 hr tablet 20 mg  20 mg Oral Q12H Shanda Howells, MD      . Derrill Memo ON 09/18/2013] polyethylene glycol (MIRALAX / GLYCOLAX) packet 17 g  17 g Oral Daily Shanda Howells, MD      . Derrill Memo ON 09/18/2013] potassium chloride SA (K-DUR,KLOR-CON) CR tablet 20 mEq  20 mEq Oral Daily Shanda Howells, MD      . sodium chloride 0.9 % injection 3 mL  3 mL Intravenous Q12H Shanda Howells, MD       Current Outpatient Prescriptions  Medication Sig Dispense Refill  . Calcium Carbonate-Vitamin D 600-400 MG-UNIT per tablet Take 1 tablet by mouth every morning.       . carvedilol (COREG) 6.25 MG tablet Take 1 tablet (6.25 mg total)  by mouth 2 (two) times daily with a meal. She takes with a 12.5 mg tablet to equal her dose of 18.75 mg.  60 tablet  6  . cyanocobalamin 500 MCG tablet Take 500 mcg by mouth daily with breakfast.       . ferrous sulfate 325 (65 FE) MG tablet Take 1 tablet (325 mg total) by mouth daily with breakfast.  30 tablet  1  . gabapentin (NEURONTIN) 300 MG capsule Take 300 mg by mouth at bedtime.       . ondansetron (ZOFRAN) 8  MG tablet Take 1 tablet (8 mg total) by mouth every 8 (eight) hours as needed for nausea or vomiting. Take two times a day starting the day after chemo for 2 days. Then take two times a day as needed for nausea or vomiting.  30 tablet  1  . oxyCODONE (OXY IR/ROXICODONE) 5 MG immediate release tablet Take 1 tablet (5 mg total) by mouth every 4 (four) hours as needed for severe pain.  30 tablet  0  . OxyCODONE (OXYCONTIN) 20 mg T12A 12 hr tablet Take 1 tablet (20 mg total) by mouth every 12 (twelve) hours.  60 tablet  0  . polyethylene glycol (MIRALAX / GLYCOLAX) packet Take 17 g by mouth daily.  14 each  0  . potassium chloride SA (K-DUR,KLOR-CON) 20 MEQ tablet Take 20 mEq by mouth daily.       Marland Kitchen PRESCRIPTION MEDICATION PACLitaxel-protein bound (ABRAXANE) chemo infusion 225 mg 100 mg/m2  2.18 m2 (Treatment Plan Actual)  Once 09/04/2013       Review Of Systems: 12 point ROS negative except as noted above in HPI.  Physical Exam: Filed Vitals:   09/17/13 1813  BP: 146/63  Pulse: 66  Temp:   Resp: 21    General: alert, cooperative and morbidly obese HEENT: PERRLA and extra ocular movement intact Heart: S1, S2 normal, no murmur, rub or gallop, regular rate and rhythm Lungs: clear to auscultation, no wheezes or rales and unlabored breathing Abdomen: obese abdomen, + L sided TTP  Extremities: extremities normal, atraumatic, no cyanosis or edema Skin:no rashes, no ecchymoses, s/p bilateral mastectomy  Neurology: normal without focal findings  Labs and Imaging: Lab Results   Component Value Date/Time   NA 133* 09/17/2013  4:45 PM   NA 134* 09/12/2013 12:50 PM   K 5.2 09/17/2013  4:45 PM   K 4.5 09/12/2013 12:50 PM   CL 94* 09/17/2013  4:45 PM   CL 106 02/14/2013 10:48 AM   CO2 30 09/17/2013  4:45 PM   CO2 31* 09/12/2013 12:50 PM   BUN 12 09/17/2013  4:45 PM   BUN 12.4 09/12/2013 12:50 PM   CREATININE 1.37* 09/17/2013  4:45 PM   CREATININE 1.4* 09/12/2013 12:50 PM   GLUCOSE 132* 09/17/2013  4:45 PM   GLUCOSE 118 09/12/2013 12:50 PM   GLUCOSE 109* 02/14/2013 10:48 AM   Lab Results  Component Value Date   WBC 11.8* 09/17/2013   HGB 10.9* 09/17/2013   HCT 33.4* 09/17/2013   MCV 89.8 09/17/2013   PLT 234 09/17/2013   Urinalysis    Component Value Date/Time   COLORURINE YELLOW 09/17/2013 1658   APPEARANCEUR CLEAR 09/17/2013 1658   LABSPEC 1.014 09/17/2013 1658   PHURINE 6.0 09/17/2013 1658   GLUCOSEU NEGATIVE 09/17/2013 1658   HGBUR NEGATIVE 09/17/2013 [place] 09/17/2013 [place] 09/17/2013 1658   PROTEINUR NEGATIVE 09/17/2013 1658   UROBILINOGEN 0.2 09/17/2013 1658   NITRITE NEGATIVE 09/17/2013 1658   LEUKOCYTESUR NEGATIVE 09/17/2013 1658       Dg Chest 2 View  09/17/2013   CLINICAL DATA:  Left upper quadrant pain.  EXAM: CHEST  2 VIEW  COMPARISON:  08/09/2013  FINDINGS: Port-A-Cath tip in the upper SVC and in a stable horizontal orientation. Evidence for small bilateral pleural effusions. Subtle densities in the right upper lung could represent asymmetric airspace disease. Heart size is upper limits of normal but unchanged. Surgical clips in the axilla bilaterally. Again noted is a calcified gallbladder  consistent with a porcelain gallbladder.  IMPRESSION: Small bilateral pleural effusions.  Subtle lung densities, particularly on the right side. Findings could represent mild edema.   Electronically Signed   By: Markus Daft M.D.   On: 09/17/2013 17:16   Ct Abdomen Pelvis W Contrast  09/17/2013   CLINICAL DATA:  Left-sided abdominal pain.  Vomiting. Breast carcinoma.  EXAM: CT ABDOMEN AND PELVIS WITH CONTRAST  TECHNIQUE: Multidetector CT imaging of the abdomen and pelvis was performed using the standard protocol following bolus administration of intravenous contrast.  CONTRAST:  148m OMNIPAQUE IOHEXOL 300 MG/ML  SOLN  COMPARISON:  PET-CT on 09/13/2013  FINDINGS: Images through the lung bases show no significant change in small to moderate bilateral pleural effusions.  Mild diffuse biliary ductal dilatation is again seen as well as porcelain gallbladder. No evidence of acute cholecystitis. No liver masses are identified. The pancreas, spleen, adrenal glands, and left kidney are normal in appearance. Severe right hydronephrosis is again demonstrated, however no obstructing calculus or mass is visualized, and this is stable since previous study. A congenital right UPJ obstruction cannot be excluded.  No abdominal or pelvic soft tissue masses or lymphadenopathy identified. No evidence of acute inflammatory process or abnormal fluid collections. Colonic diverticulosis noted, however there is no evidence of diverticulitis. Tiny amount of free fluid noted in pelvic cul-de-sac, which is nonspecific. Mild diffuse body wall edema noted.  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 No other suspicious bone lesions identified.  IMPRESSION: No acute findings within the abdomen or pelvis.  Stable severe right hydronephrosis. Etiology not apparent by CT. Congenital UPJ obstruction cannot be excluded.  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   Electronically Signed   By: JEarle GellM.D.   On: 09/17/2013 18:57     Assessment and Plan: BToshiko Kemleris a 68y.o. year old female presenting with abdominal pain   Abdominal Pain: no acute findings on imaging today. Pain may likely be secondary to metastatic disease. IV  dilaudid prn for pain. Noted R sided hydronephrosis. Likely unrelated to current presentation as this has been a chronic issue. Will follow closely Hem-Onc consult in am.   Leukocytosis: unclear etiology. No overt signs of infection currently. Urine culture. Will hydrate and reassess. Consider broad spectrum abx if WBC persists despite hydratrion in setting of metastatic disease.   CHF: No clinical signs of volume overload. Mildly dry on exam. Will continue to closely follow.    FEN/GI: Mildly dry on exam. Gentle hydration in setting of baseline cardiomyopathy. Cr though elevated, seems to be at baseline. Hydrate and reassess. Will defer issue of hydronephrosis to am team and H-O as this seems to be a chronic issue.   Prophylaxis: subq heparin  Disposition: pending further evaluation  Code Status:Full Code.         SShanda HowellsMD  Pager: [phone]

## 2013-09-17 NOTE — ED Notes (Signed)
Bed: CZ66 Expected date:  Expected time:  Means of arrival:  Comments: EMS-left side pain, cancer pt

## 2013-09-18 ENCOUNTER — Other Ambulatory Visit: Payer: PRIVATE HEALTH INSURANCE

## 2013-09-18 ENCOUNTER — Ambulatory Visit: Payer: PRIVATE HEALTH INSURANCE | Admitting: Physician Assistant

## 2013-09-18 ENCOUNTER — Encounter: Payer: Self-pay | Admitting: Radiation Oncology

## 2013-09-18 ENCOUNTER — Ambulatory Visit: Payer: PRIVATE HEALTH INSURANCE

## 2013-09-18 ENCOUNTER — Ambulatory Visit
Admit: 2013-09-18 | Discharge: 2013-09-18 | Disposition: A | Discharge status: Home / Self Care | Payer: PRIVATE HEALTH INSURANCE | Attending: Radiation Oncology | Admitting: Radiation Oncology

## 2013-09-18 DIAGNOSIS — C7951 Secondary malignant neoplasm of bone: Secondary | ICD-10-CM

## 2013-09-18 DIAGNOSIS — C773 Secondary and unspecified malignant neoplasm of axilla and upper limb lymph nodes: Secondary | ICD-10-CM

## 2013-09-18 DIAGNOSIS — Z923 Personal history of irradiation: Secondary | ICD-10-CM

## 2013-09-18 DIAGNOSIS — C7952 Secondary malignant neoplasm of bone marrow: Secondary | ICD-10-CM

## 2013-09-18 DIAGNOSIS — C50919 Malignant neoplasm of unspecified site of unspecified female breast: Secondary | ICD-10-CM

## 2013-09-18 DIAGNOSIS — C779 Secondary and unspecified malignant neoplasm of lymph node, unspecified: Secondary | ICD-10-CM

## 2013-09-18 DIAGNOSIS — R1012 Left upper quadrant pain: Secondary | ICD-10-CM

## 2013-09-18 DIAGNOSIS — R109 Unspecified abdominal pain: Secondary | ICD-10-CM | POA: Diagnosis not present

## 2013-09-18 LAB — CBC WITH DIFFERENTIAL/PLATELET
BASOS PCT: 0 % (ref 0–1)
Basophils Absolute: 0 10*3/uL (ref 0.0–0.1)
EOS ABS: 0 10*3/uL (ref 0.0–0.7)
EOS PCT: 0 % (ref 0–5)
HCT: 32.8 % — ABNORMAL LOW (ref 36.0–46.0)
HEMOGLOBIN: 10.4 g/dL — AB (ref 12.0–15.0)
Lymphocytes Relative: 11 % — ABNORMAL LOW (ref 12–46)
Lymphs Abs: 1.5 10*3/uL (ref 0.7–4.0)
MCH: 29 pg (ref 26.0–34.0)
MCHC: 31.7 g/dL (ref 30.0–36.0)
MCV: 91.4 fL (ref 78.0–100.0)
MONOS PCT: 7 % (ref 3–12)
Monocytes Absolute: 1 10*3/uL (ref 0.1–1.0)
NEUTROS PCT: 82 % — AB (ref 43–77)
Neutro Abs: 11.2 10*3/uL — ABNORMAL HIGH (ref 1.7–7.7)
PLATELETS: 230 10*3/uL (ref 150–400)
RBC: 3.59 MIL/uL — ABNORMAL LOW (ref 3.87–5.11)
RDW: 15.5 % (ref 11.5–15.5)
WBC: 13.7 10*3/uL — ABNORMAL HIGH (ref 4.0–10.5)

## 2013-09-18 LAB — COMPREHENSIVE METABOLIC PANEL
ALBUMIN: 2.7 g/dL — AB (ref 3.5–5.2)
ALT: 10 U/L (ref 0–35)
AST: 23 U/L (ref 0–37)
Alkaline Phosphatase: 96 U/L (ref 39–117)
BILIRUBIN TOTAL: 0.3 mg/dL (ref 0.3–1.2)
BUN: 12 mg/dL (ref 6–23)
CO2: 31 meq/L (ref 19–32)
Calcium: 8.9 mg/dL (ref 8.4–10.5)
Chloride: 96 mEq/L (ref 96–112)
Creatinine, Ser: 1.36 mg/dL — ABNORMAL HIGH (ref 0.50–1.10)
GFR calc Af Amer: 45 mL/min — ABNORMAL LOW (ref >90)
GFR, EST NON AFRICAN AMERICAN: 39 mL/min — AB (ref >90)
Glucose, Bld: 79 mg/dL (ref 70–99)
Potassium: 5 mEq/L (ref 3.7–5.3)
SODIUM: 135 meq/L — AB (ref 137–147)
Total Protein: 6.8 g/dL (ref 6.0–8.3)

## 2013-09-18 MED ORDER — RESOURCE INSTANT PROTEIN PO PWD PACKET
1.0000 | Freq: Two times a day (BID) | ORAL | Status: DC
  Administered 2013-09-18 – 2013-09-23 (×5): 6 g via ORAL
  Filled 2013-09-18 (×12): qty 6

## 2013-09-18 MED ORDER — BENEPROTEIN PO POWD: 1.0000 | Freq: Two times a day (BID) | ORAL | Status: DC

## 2013-09-18 NOTE — Progress Notes (Addendum)
INITIAL NUTRITION ASSESSMENT  DOCUMENTATION CODES Per approved criteria  -Obesity Unspecified   INTERVENTION: Recommend Beneprotein BID Recommend MagicCup once daily  NUTRITION DIAGNOSIS: Increased nutrient need (protein/kcal) related to increased demand for nutrient as evidenced by stage 3 sacral ulcer, chronic disease-breast cancer w/mets.   Goal: Pt to meet >/= 90% of their estimated nutrition needs   Monitor:  Total protein/energy intake, GI profile, labs, weights, skin integrity  Reason for Assessment: MST  68 y.o. female  Admitting Dx: <principal problem not specified>  ASSESSMENT: 68 y.o. year old female with prior history of metastatic breast cancer s/p mastectomy, on chemotherapy, chemotherapy induced cardiomyopathy, chronic pleural effusions presents with severe lower abdominal pain. Pt states that she has had progressively LLQ abdominal pain over the past 2-3 days that came to the point of being intractable despite home po narcotics. Pt has had some secondary nausea. No fevers, chills, vomiting, diarrhea, dysuria. Has been able to hold down some liquids.   -Pt with severe abd pain that has inhibited PO intake and resulted in unintentional wt loss of 14 lbs in two-three month period -Diet recall showed pt with eggs/toast/grits/bacon for breakfast and daughter provides pt with [employer] sandwiches for lunch and dinner as daughter is employed there.  -Declined Ensure supplement as is causes her to have loose stools and aggravates stomach pain -Trialed [employer], pt dislikes as she thought it to be too sweet, even after mixed with soft drinks -Was willing to try Beneprotein supplement, encouraged to mix in with soups, mashed potatoes or applesauce. Also in agreement to try MagicCup supplement as pt enjoys ice cream/sherbert -Current PO intake 25% of meals  Nutrition Focused Physical Exam:  Subcutaneous Fat:  Orbital Region: WNL Upper Arm Region: WNL Thoracic and  Lumbar Region: WNL  Muscle:  Temple Region: moderate Clavicle Bone Region: WNL Clavicle and Acromion Bone Region: WNL Scapular Bone Region: WNL Dorsal Hand: WNL Patellar Region: WNL Anterior Thigh Region: WNL Posterior Calf Region: moderate  Edema: N/A   Height: Ht Readings from Last 1 Encounters:  09/17/13 5\' 4"  (1.626 m)    Weight: Wt Readings from Last 1 Encounters:  09/17/13 216 lb 1.6 oz (98.022 kg)    Ideal Body Weight: 120 lbs  % Ideal Body Weight: 180%  Wt Readings from Last 10 Encounters:  09/17/13 216 lb 1.6 oz (98.022 kg)  09/12/13 218 lb 12.8 oz (99.247 kg)  09/04/13 226 lb (102.513 kg)  08/28/13 228 lb 11.2 oz (103.738 kg)  08/14/13 230 lb 1.6 oz (104.373 kg)  08/07/13 231 lb 0.7 oz (104.8 kg)  07/30/13 239 lb (108.41 kg)  07/21/13 236 lb 3.2 oz (107.14 kg)  07/19/13 234 lb (106.142 kg)  07/19/13 234 lb 14.4 oz (106.55 kg)    Usual Body Weight: 230 lbs  % Usual Body Weight: 94%  BMI:  Body mass index is 37.08 kg/(m^2). Obesity II  Estimated Nutritional Needs: Kcal: 1900-2100 Protein: 115-125 Fluid: >/=2100 ml/daily  Skin: Stage 3 sacral ulcer  Diet Order: General  EDUCATION NEEDS: -No education needs identified at this time   Intake/Output Summary (Last 24 hours) at 09/18/13 1447 Last data filed at 09/18/13 1442  Gross per 24 hour  Intake    600 ml  Output    200 ml  Net    400 ml    Last BM: none during admit   Labs:   Recent Labs Lab 09/12/13 1250 09/17/13 1645 09/18/13 0333  NA 134* 133* 135*  K 4.5 5.2 5.0  CL  --  94* 96  CO2 31* 30 31  BUN 12.$Remov'4 12 12  'OetEtf$ CREATININE 1.4* 1.37* 1.36*  CALCIUM 8.7 9.0 8.9  GLUCOSE 118 132* 79    CBG (last 3)  No results found for this basename: GLUCAP,  in the last 72 hours  Scheduled Meds: . calcium-vitamin D  1 tablet Oral q morning - 10a  . carvedilol  18.75 mg Oral BID WC  . cyanocobalamin  500 mcg Oral Q breakfast  . ferrous sulfate  325 mg Oral Q breakfast  .  gabapentin  300 mg Oral QHS  . heparin  5,000 Units Subcutaneous Q8H  . OxyCODONE  20 mg Oral Q12H  . polyethylene glycol  17 g Oral Daily  . potassium chloride SA  20 mEq Oral Daily  . sodium chloride  3 mL Intravenous Q12H    Continuous Infusions: . sodium chloride 75 mL/hr at 09/17/13 2231    Past Medical History  Diagnosis Date  . Hypertension   . Hearing loss     right ear  . Hyperlipidemia   . Arthritis   . Dysrhythmia     A fib with IV chemo treatments  . Shortness of breath     with exertion  . 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 Breast cancer     (Rt) breast ca dx 9/11  . 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 Neuropathy   . History of radiation therapy 01/18/11-03/07/11    5940 cGy to Right chest wall, axilla and supraclavicular region  . Lymphedema of arm 08/28/2013  . Tuberculosis     15 yrs ago- treated for 1 yeat    Past Surgical History  Procedure Laterality Date  . Tubal ligation    . Portacath placement Left   . Abdominal hysterectomy    . Lung biopsy    . Lesion excision      L anterior thigh  . Breast surgery  12/01/10    ER+,PR+,Ki 67 64%,Her2 -; right breast lumpectomy  . Chest tube insertion  08/08/2012    Procedure: INSERTION PLEURAL DRAINAGE CATHETER;  Surgeon: Gaye Pollack, MD;  Location: [facility];  Service: Thoracic;  Laterality: Right;  . Removal of pleural drainage catheter Right 10/10/2012    Procedure: REMOVAL OF PLEURAL DRAINAGE CATHETER;  Surgeon: Gaye Pollack, MD;  Location: [facility];  Service: Thoracic;  Laterality: Right;  . Mastectomy, radical Left 05/08/2013    Dr Marlou Starks  . Mastectomy modified radical Left 05/08/2013    Procedure: MASTECTOMY MODIFIED RADICAL;  Surgeon: Merrie Roof, MD;  Location: [facility];  Service: General;  Laterality: Left;    Atlee Abide MS RD LDN Clinical Dietitian [phone]

## 2013-09-18 NOTE — Progress Notes (Signed)
UR completed 

## 2013-09-18 NOTE — Progress Notes (Signed)
[facility] PROGRESS NOTE  Emmajean Ratledge WJX:[id number] DOB: 1946/07/13 DOA: 09/17/2013 PCP: [name], NP  Assessment/Plan:  Abdominal/Thoracic pain - Oncology on board. Current discomfort consistent with local chest wall involvement radiology oncology has been consulted and will be available for pain control options moving forward.  Leukocytosis - Could be stress reaction from principal problem. Patient has been afebrile  Code Status: full Family Communication: None Disposition Plan: Per oncology   Consultants:  Oncology  Oncology radiology   Antibiotics:  None  HPI/Subjective: Pt has no new complaints.  Objective: Filed Vitals:   09/18/13 1441  BP: 139/48  Pulse: 64  Temp: 98 F (36.7 C)  Resp: 18    Intake/Output Summary (Last 24 hours) at 09/18/13 1838 Last data filed at 09/18/13 1702  Gross per 24 hour  Intake    600 ml  Output    500 ml  Net    100 ml   Filed Weights   09/17/13 2319  Weight: 98.022 kg (216 lb 1.6 oz)    Exam:   General:  Pt in NAD, Alert and awake  Cardiovascular: RRR, no MRG  Respiratory: CTA BL, no wheezes  Abdomen: soft, NT, ND  Musculoskeletal: no cyanosis or clubbing   Data Reviewed: Basic Metabolic Panel:  Recent Labs Lab 09/12/13 1250 09/17/13 1645 09/18/13 0333  NA 134* 133* 135*  K 4.5 5.2 5.0  CL  --  94* 96  CO2 31* 30 31  GLUCOSE 118 132* 79  BUN 12.4 12 12   CREATININE 1.4* 1.37* 1.36*  CALCIUM 8.7 9.0 8.9   Liver Function Tests:  Recent Labs Lab 09/12/13 1250 09/17/13 1645 09/18/13 0333  AST 23 27 23   ALT 19 11 10   ALKPHOS 106 100 96  BILITOT 0.30 0.2* 0.3  PROT 6.9 7.0 6.8  ALBUMIN 2.7* 2.7* 2.7*    Recent Labs Lab 09/17/13 1645  LIPASE 22   No results found for this basename: AMMONIA,  in the last 168 hours CBC:  Recent Labs Lab 09/12/13 1249 09/17/13 1645 09/18/13 0333  WBC 8.2 11.8* 13.7*  NEUTROABS 6.3  --  11.2*  HGB 8.2* 10.9* 10.4*  HCT 25.0*  33.4* 32.8*  MCV 90.8 89.8 91.4  PLT 243 234 230   Cardiac Enzymes: No results found for this basename: CKTOTAL, CKMB, CKMBINDEX, TROPONINI,  in the last 168 hours BNP (last 3 results)  Recent Labs  08/03/13 2100  PROBNP 612.9*   CBG:  Recent Labs Lab 09/13/13 1121  GLUCAP 112*    No results found for this or any previous visit (from the past 240 hour(s)).   Studies: Dg Chest 2 View  09/17/2013   CLINICAL DATA:  Left upper quadrant pain.  EXAM: CHEST  2 VIEW  COMPARISON:  08/09/2013  FINDINGS: Port-A-Cath tip in the upper SVC and in a stable horizontal orientation. Evidence for small bilateral pleural effusions. Subtle densities in the right upper lung could represent asymmetric airspace disease. Heart size is upper limits of normal but unchanged. Surgical clips in the axilla bilaterally. 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  IMPRESSION: Small bilateral pleural effusions.  Subtle lung densities, particularly on the right side. Findings could represent mild edema.   Electronically Signed   By: Markus Daft M.D.   On: 09/17/2013 17:16   Ct Abdomen Pelvis W Contrast  09/17/2013   CLINICAL DATA:  Left-sided abdominal pain. Vomiting. Breast carcinoma.  EXAM: CT ABDOMEN AND PELVIS WITH CONTRAST  TECHNIQUE:  Multidetector CT imaging of the abdomen and pelvis was performed using the standard protocol following bolus administration of intravenous contrast.  CONTRAST:  150mL OMNIPAQUE IOHEXOL 300 MG/ML  SOLN  COMPARISON:  PET-CT on 09/13/2013  FINDINGS: Images through the lung bases show no significant change in small to moderate bilateral pleural effusions.  Mild diffuse biliary ductal dilatation is again seen as well as porcelain gallbladder. No evidence of acute cholecystitis. No liver masses are identified. The pancreas, spleen, adrenal glands, and left kidney are normal in appearance. Severe right hydronephrosis is again demonstrated, however no  obstructing calculus or mass is visualized, and this is stable since previous study. A congenital right UPJ obstruction cannot be excluded.  No abdominal or pelvic soft tissue masses or lymphadenopathy identified. No evidence of acute inflammatory process or abnormal fluid collections. Colonic diverticulosis noted, however there is no evidence of diverticulitis. Tiny amount of free fluid noted in pelvic cul-de-sac, which is nonspecific. Mild diffuse body wall edema noted.  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 No other suspicious bone lesions identified.  IMPRESSION: No acute findings within the abdomen or pelvis.  Stable severe right hydronephrosis. Etiology not apparent by CT. Congenital UPJ obstruction cannot be excluded.  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   Electronically Signed   By: Earle Gell M.D.   On: 09/17/2013 18:57    Scheduled Meds: . calcium-vitamin D  1 tablet Oral q morning - 10a  . carvedilol  18.75 mg Oral BID WC  . cyanocobalamin  500 mcg Oral Q breakfast  . ferrous sulfate  325 mg Oral Q breakfast  . gabapentin  300 mg Oral QHS  . heparin  5,000 Units Subcutaneous Q8H  . OxyCODONE  20 mg Oral Q12H  . polyethylene glycol  17 g Oral Daily  . potassium chloride SA  20 mEq Oral Daily  . protein supplement  1 scoop Oral BID WC  . sodium chloride  3 mL Intravenous Q12H   Continuous Infusions: . sodium chloride 75 mL/hr at 09/17/13 2231    Active Problems:    Time spent: > 35 minutes    Velvet Bathe  [facility] Pager [phone]. If 7PM-7AM, please contact night-coverage at [email or web address], password [facility] 09/18/2013, 6:38 PM  LOS: 1 day

## 2013-09-18 NOTE — Consult Note (Signed)
[place]  [phone] [phone]     ID: Jeanette Morris OB: Feb 26, 1946  MR#: [id number]  HBZ#:[id number]  PCP: [name], NP GYN:   SU: [facility] OTHER MD: Gery Pray  CHIEF COMPLAINT: "I just couldn't take the pain"  INTERVAL HISTORY: Jeanette Morris developed worsening LUQ pain 2 days PTA. This was not associated with nausea, vomiting or change in bowel habits. Despite using the available pain medication the problem worsened and she presented to the ED yesterday where CT of the abd/ pelvis failed to show an obvious cause. She was admitted for pain control and further evaluation.  REVIEW OF SYSTEMS: Jeanette Morris is able to put on finger right on the spot where she hurts, indicating a chest wall/ rib cage locus. She denies pleurisy. She had a normal BM yesterday. There have been no unusual headaches, visual changes, or dizzyness. A detailed ROS today was otherwise stable  HISTORY OF PRESENT ILLNESS: The patient undergoes annual screening mammography. Her mammograms previously had fullness. In August 2011 she presented with some firmness of her right breast. She also noted some skin thickening around her nipple and some tenderness. She was subsequently seen for this on 05/13/2010 and was referred for bilateral mammogram and right breast ultrasound. There was concern that this may be mastitis. There were diffuse skin thickening, tissue edema, enlarged lymph node in right axilla, some subareolar distortion secondary to the previous surgery. She was given a course of doxycycline for 10 days and asked to return. A follow-up ultrasound of the breast showed persistent abnormalities. She was referred for biopsy. She underwent biopsy of the right axilla on 05/27/2010 and it showed metastatic carcinoma consistent with breast primary. This was ER and PR positive, HER-2 was positive with a ratio of 3.09. The ER ratio is 34%, PR was 64%, proliferative index 60%. She  subsequently had a MRI scan of both breasts on 06/03/2010 and this showed a area of minimal enhancement in the right breast measuring 10.0 x 6.0 x 3.0 cm. Enlarged lymph nodes were seen in the right axilla. Largest node measuring 3.0 x 1.0 x 7.0 cm. Intramammary lymph nodes were also seen in the outer left breast, the largest measuring 2.8 x 2.5 cm. 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  The patient's subsequent history is as detailed below.   PAST MEDICAL HISTORY: Past Medical History  Diagnosis Date  . Hypertension   . Hearing loss     right ear  . Hyperlipidemia   . Arthritis   . Dysrhythmia     A fib with IV chemo treatments  . Shortness of breath     with exertion  . 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 Breast cancer     (Rt) breast ca dx 9/11  . 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 Neuropathy   . History of radiation therapy 01/18/11-03/07/11    5940 cGy to Right chest wall, axilla and supraclavicular region  . Lymphedema of arm 08/28/2013  . Tuberculosis     15 yrs ago- treated for 1 yeat    PAST SURGICAL HISTORY: Past Surgical History  Procedure Laterality Date  . Tubal ligation    . Portacath placement Left   . Abdominal hysterectomy    . Lung biopsy    . Lesion excision      L anterior thigh  . Breast surgery  12/01/10    ER+,PR+,Ki 67 64%,Her2 -; right breast lumpectomy  . Chest tube insertion  08/08/2012    Procedure: INSERTION PLEURAL DRAINAGE CATHETER;  Surgeon: Gaye Pollack, MD;  Location: [facility];  Service: Thoracic;  Laterality: Right;  . Removal of pleural drainage catheter Right 10/10/2012    Procedure: REMOVAL OF PLEURAL DRAINAGE CATHETER;  Surgeon: Gaye Pollack, MD;  Location: [facility];  Service: Thoracic;  Laterality: Right;  . Mastectomy, radical Left 05/08/2013    Dr Marlou Starks  . Mastectomy modified radical Left 05/08/2013    Procedure: MASTECTOMY MODIFIED RADICAL;  Surgeon: Merrie Roof,  MD;  Location: [facility];  Service: General;  Laterality: Left;    FAMILY HISTORY History reviewed. No pertinent family history. The patient's father died in an automobile accident at age 52. The patient's mother died at age 68 with Alzheimer's disease. The patient had 8 brothers and 4 sisters. There is no history of breast or ovarian cancer in the family.  GYNECOLOGIC HISTORY:  Menarche age 65, first live birth age 65. She is GX P7. She underwent menopause "more than 20 years ago". She never took hormone replacement.  SOCIAL HISTORY:  She has been mostly a housewife. Her husband [name] used to be a cook at [employer]. He completed radiation therapy for prostate cancer under [facility]lo[facility] April of 2014. The patient's children are from an earlier marriage, and include Evlyn Kanner, [place], [place], [place], Danielle [place], [place], and [employer]. The patient has 15 grandchildren and 1 great-grandchild. She attends [employer]     ADVANCED DIRECTIVES: not in place   HEALTH MAINTENANCE: History  Substance Use Topics  . Smoking status: Former Smoker    Quit date: 06/30/1973  . Smokeless tobacco: Never Used  . Alcohol Use: No     Colonoscopy:  PAP:  Bone density:  Lipid panel:  No Known Allergies  Current Facility-Administered Medications  Medication Dose Route Frequency Provider Last Rate Last Dose  . 0.9 %  sodium chloride infusion   Intravenous Continuous Shanda Howells, MD 75 mL/hr at 09/17/13 2231    . calcium-vitamin D (OSCAL WITH D) 500-200 MG-UNIT per tablet 1 tablet  1 tablet Oral q morning - 10a Shanda Howells, MD   1 tablet at 09/18/13 1007  . carvedilol (COREG) tablet 18.75 mg  18.75 mg Oral BID WC Shanda Howells, MD   18.75 mg at 09/18/13 0748  . cyanocobalamin tablet 500 mcg  500 mcg Oral Q breakfast Shanda Howells, MD   500 mcg at 09/18/13 0748  . ferrous sulfate tablet 325 mg  325 mg Oral Q breakfast Shanda Howells, MD   325 mg  at 09/18/13 0748  . gabapentin (NEURONTIN) capsule 300 mg  300 mg Oral QHS Shanda Howells, MD   300 mg at 09/17/13 2336  . heparin injection 5,000 Units  5,000 Units Subcutaneous Q8H Shanda Howells, MD   5,000 Units at 09/18/13 0703  . HYDROmorphone (DILAUDID) injection 1-2 mg  1-2 mg Intravenous Q2H PRN Shanda Howells, MD   1 mg at 09/18/13 0748  . ondansetron (ZOFRAN) tablet 8 mg  8 mg Oral Q8H PRN Shanda Howells, MD   8 mg at 09/18/13 0839  . OxyCODONE (OXYCONTIN) 12 hr tablet 20 mg  20 mg Oral Q12H Shanda Howells, MD   20 mg at 09/18/13 1007  . polyethylene glycol (MIRALAX / GLYCOLAX) packet 17 g  17 g Oral Daily Shanda Howells, MD   [phone]  g at 09/18/13 1009  . potassium chloride SA (K-DUR,KLOR-CON) CR tablet 20 mEq  20 mEq Oral Daily Shanda Howells, MD   20 mEq at 09/18/13 1007  . sodium chloride 0.9 % injection 3 mL  3 mL Intravenous Q12H Shanda Howells, MD        OBJECTIVE: middle aged African American woman exmained in bed Filed Vitals:   09/18/13 0455  BP: 124/56  Pulse: 72  Temp: 98 F (36.7 C)  Resp: 18     Body mass index is 37.08 kg/(m^2).    ECOG FS:2 - Symptomatic, <50% confined to bed  Ocular: Sclerae unicteric, bilateral arcus senilis Lymphatic: No cervical or supraclavicular adenopathy Lungs no rales or rhonchi, auscultated anterolaterally Heart regular rate and rhythm, no murmur appreciated Abd soft, positive bowel sounds; there is focal tenderness to moderate palpation at the site where the patient localizes her pain; there is no mass, erythema or rash at the site Neuro: non-focal, well-oriented, appropriate affect Breasts: deferred   LAB RESULTS:  CMP     Component Value Date/Time   NA 135* 09/18/2013 0333   NA 134* 09/12/2013 1250   K 5.0 09/18/2013 0333   K 4.5 09/12/2013 1250   CL 96 09/18/2013 0333   CL 106 02/14/2013 1048   CO2 31 09/18/2013 0333   CO2 31* 09/12/2013 1250   GLUCOSE 79 09/18/2013 0333   GLUCOSE 118 09/12/2013 1250   GLUCOSE 109* 02/14/2013 1048   BUN  12 09/18/2013 0333   BUN 12.4 09/12/2013 1250   CREATININE 1.36* 09/18/2013 0333   CREATININE 1.4* 09/12/2013 1250   CALCIUM 8.9 09/18/2013 0333   CALCIUM 8.7 09/12/2013 1250   PROT 6.8 09/18/2013 0333   PROT 6.9 09/12/2013 1250   ALBUMIN 2.7* 09/18/2013 0333   ALBUMIN 2.7* 09/12/2013 1250   AST 23 09/18/2013 0333   AST 23 09/12/2013 1250   ALT 10 09/18/2013 0333   ALT 19 09/12/2013 1250   ALKPHOS 96 09/18/2013 0333   ALKPHOS 106 09/12/2013 1250   BILITOT 0.3 09/18/2013 0333   BILITOT 0.30 09/12/2013 1250   GFRNONAA 39* 09/18/2013 0333   GFRAA 45* 09/18/2013 0333    I No results found for this basename: SPEP, UPEP,  kappa and lambda light chains    Lab Results  Component Value Date   WBC 13.7* 09/18/2013   NEUTROABS 11.2* 09/18/2013   HGB 10.4* 09/18/2013   HCT 32.8* 09/18/2013   MCV 91.4 09/18/2013   PLT 230 09/18/2013    _0 @  Lab Results  Component Value Date   LABCA2 95* 08/14/2012    No components found with this basename: [id number]    No results found for this basename: INR,  in the last 168 hours  Urinalysis    Component Value Date/Time   COLORURINE YELLOW 09/17/2013 [place] 09/17/2013 1658   LABSPEC 1.014 09/17/2013 1658   PHURINE 6.0 09/17/2013 [place] 09/17/2013 [place] 09/17/2013 [place] 09/17/2013 [place] 09/17/2013 [place] 09/17/2013 1658   UROBILINOGEN 0.2 09/17/2013 1658   NITRITE NEGATIVE 09/17/2013 1658   LEUKOCYTESUR NEGATIVE 09/17/2013 1658    STUDIES: Dg Chest 2 View  09/17/2013   CLINICAL DATA:  Left upper quadrant pain.  EXAM: CHEST  2 VIEW  COMPARISON:  08/09/2013  FINDINGS: Port-A-Cath tip in the upper SVC and in a stable horizontal orientation. Evidence for small bilateral pleural effusions. Subtle densities  in the right upper lung could represent asymmetric airspace disease. Heart size is upper limits of normal but unchanged. Surgical clips in the  axilla bilaterally. 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  IMPRESSION: Small bilateral pleural effusions.  Subtle lung densities, particularly on the right side. Findings could represent mild edema.   Electronically Signed   By: Markus Daft M.D.   On: 09/17/2013 17:16   Ct Chest W Contrast  09/13/2013   CLINICAL DATA:  History of right breast cancer diagnosed in 2011 and left breast cancer diagnosed in 2014. Chemotherapy in progress. Bilateral arm lymphedema and shortness of breath.  EXAM: CT CHEST WITH CONTRAST  TECHNIQUE: Multidetector CT imaging of the chest was performed during intravenous contrast administration.  CONTRAST:  39m OMNIPAQUE IOHEXOL 300 MG/ML  SOLN  COMPARISON:  NM PET IMAGE RESTAG (PS) SKULL BASE TO THIGH dated 09/13/2013; CT ANGIO CHEST W/CM &/OR WO/CM dated 07/22/2013  FINDINGS: Left subclavian Port-A-Cath tip is unchanged in the upper SVC. 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  There is residual supraclavicular soft tissue fullness bilaterally, hypermetabolic on today's PET-CT and consistent with residual metastatic adenopathy. This has improved from the prior studies, although is not well defined on CT. On the left, this measures approximately 3.5 x 2.4 cm on image 6. A 2.0 cm soft tissue nodule adjacent to the left clavicle on image number 1 is hypermetabolic on PET. The subcutaneous nodularity throughout the left chest wall and axilla has improved. There is less asymmetric enlargement of the left pectoralis muscle.  There are no enlarged mediastinal, hilar or internal mammary lymph nodes. Small bilateral pleural effusions are present. There is no pericardial effusion. The heart is enlarged. There are diffuse coronary artery calcifications.  Mild dependent atelectasis is present in both lower lobes. There are no suspicious pulmonary nodules.  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  There is no adrenal mass.  Multiple lytic metastases involving the lower thoracic spine have progressed. These are most prominent within the left aspects of T9 through T12 vertebral bodies. There is destruction of the left eleventh rib at the costovertebral junction. There is adjacent left paraspinal tumor extending into the neural foramina. No gross intraspinal extension is identified. Multiple rib metastases are demonstrated with a probable pathologic fracture of the right seventh rib posteriorly. There is also comminuted and displaced fracture of the medial right clavicle.  IMPRESSION: 1. Interval improvement in left chest wall tumor and bilateral supraclavicular nodal metastases. 2. No mediastinal adenopathy or pulmonary metastases identified. 3. Small bilateral pleural effusions without nodularity or hypermetabolism on today's PET-CT. 4. Multifocal osseous metastatic disease with progression, especially in the lower thoracic spine and left paraspinal soft tissues. Rib and right clavicle fractures are noted.   Electronically Signed   By: BCamie PatienceM.D.   On: 09/13/2013 14:50   Ct Abdomen Pelvis W Contrast  09/17/2013   CLINICAL DATA:  Left-sided abdominal pain. Vomiting. Breast carcinoma.  EXAM: CT ABDOMEN AND PELVIS WITH CONTRAST  TECHNIQUE: Multidetector CT imaging of the abdomen and pelvis was performed using the standard protocol following bolus administration of intravenous contrast.  CONTRAST:  1072mOMNIPAQUE IOHEXOL 300 MG/ML  SOLN  COMPARISON:  PET-CT on 09/13/2013  FINDINGS: Images through the lung bases show no significant change in small to moderate bilateral pleural effusions.  Mild diffuse biliary ductal dilatation is again seen as well as porcelain gallbladder. No evidence of acute cholecystitis. No liver  masses are identified. The pancreas, spleen, adrenal glands, and left kidney are normal in appearance. Severe right hydronephrosis is again demonstrated, however no obstructing calculus or  mass is visualized, and this is stable since previous study. A congenital right UPJ obstruction cannot be excluded.  No abdominal or pelvic soft tissue masses or lymphadenopathy identified. No evidence of acute inflammatory process or abnormal fluid collections. Colonic diverticulosis noted, however there is no evidence of diverticulitis. Tiny amount of free fluid noted in pelvic cul-de-sac, which is nonspecific. Mild diffuse body wall edema noted.  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 No other suspicious bone lesions identified.  IMPRESSION: No acute findings within the abdomen or pelvis.  Stable severe right hydronephrosis. Etiology not apparent by CT. Congenital UPJ obstruction cannot be excluded.  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   Electronically Signed   By: Earle Gell M.D.   On: 09/17/2013 18:57   Nm Pet Image Restag (ps) Skull Base To Thigh  09/13/2013   CLINICAL DATA:  Subsequent treatment strategy for bilateral breast cancer.  EXAM: NUCLEAR MEDICINE PET SKULL BASE TO THIGH  FASTING BLOOD GLUCOSE:  Value: 130m/dl  TECHNIQUE: 16.4 mCi F-18 FDG was injected intravenously. CT data was obtained and used for attenuation correction and anatomic localization only. (This was not acquired as a diagnostic CT examination.) Additional exam technical data entered on technologist worksheet.  COMPARISON:  NM PET IMAGE RESTAG (PS) SKULL BASE TO THIGH dated 07/02/2013  FINDINGS: NECK  There is persistent but improved bilateral supraclavicular hypermetabolic adenopathy.These are ill-defined and somewhat difficult to measure, although measure approximately 2.2 cm on the right (image 45, SUV max 8.4) and 1.9 cm on the left (image 49, SUV max 7.0). There is no upper cervical adenopathy. Asymmetric goiter appears stable without abnormal metabolic activity.  CHEST  There  has been interval improvement in the multifocal activity within the left chest wall and axilla. There is a residual 2.5 cm component adjacent to the left clavicle on image 41. There is no mediastinal or hilar hypermetabolic nodal activity. Diffuse muscular activity is again noted within the upper right arm, similar to the prior study. Substernal extension of goiter, coronary artery disease and bilateral pleural effusions are again noted. No residual abnormal activity is seen within the right pleural space.  ABDOMEN/PELVIS  There is no hypermetabolic activity within the liver, adrenal glands, spleen or pancreas. There is no significant residual central mesenteric hypermetabolic activity. The left external iliac node noted previously has resolved. There is new dilatation of the right renal pelvis and proximal ureter without high-grade ureteral obstruction. Porcelain gallbladder again noted.  SKELETON  Multifocal osseous metastases are again noted. The upper thoracic and rib activity noted previously has improved. However, there is worsening activity within the lower thoracic spine. This corresponds with enlarging lytic lesions involving the left aspects of the T9, T10 and T11 vertebral bodies and posterior elements (CT images 82- 100). There is left paraspinal soft tissue tumor as well as possible epidural tumor, difficult to assess given streak artifact from the patient's arms. Additional lesions are present within the ribs and bony pelvis.  IMPRESSION: 1. There has been improvement in the left chest wall activity and multiple nodal metastasis involving the left axilla and supraclavicular stations bilaterally. 2. Persistent bilateral pleural effusions without associated abnormal metabolic activity. 3. Fluctuating osseous metastases with improvement in the upper thoracic  spine and upper right chest wall. There is worsening disease in the lower left thoracic spine with the potential for epidural tumor in this region.  Again, spinal assessment is limited by body habitus. If the patient has focal neurologic symptoms, further evaluation with MRI may be helpful.   Electronically Signed   By: Camie Patience M.D.   On: 09/13/2013 13:58    ASSESSMENT: 68 y.o. [place] woman with stage IV breast cancer  (1) status post RIGHT axillary lymph node biopsy 05/27/2010 for a clinical T4 N1, stage IIIB (inflammatory) invasive ductal carcinoma, grade not stated, estrogen receptor 94% positive, progesterone receptor 64% positive, with an MIB-1 of 60%, and HER-2 amplification by [facility] with a ratio of 7.5.  (2) neoadjuvant chemotherapy consisted of 6 cycles of standard carboplatin, docetaxel, trastuzumab, with the trastuzumab held cycles 5 and 6 due to a drop in her borderline ejection fraction (from 45-50% at baseline to 40-45% January 2012)-- total trastuzumab treatment = 2 months  (3) status post right mastectomy and axillary lymph node dissection 12/01/2010, showing no residual invasive carcinoma in the breast, but residual tumor in 20 of 21 axillary lymph nodes. (Only one of the 20 positive lymph nodes showed a significant residual tumor, measuring 5 mm; the remaining lymph nodes, including the evidence of extracapsular extension, showed single and clusters a few neoplastic cells with extensive therapy related changes).  (4) postmastectomy radiation to the right chest wall, axilla, and supraclavicular region completed 03/07/2011  (5) additional 2 months of trastuzumab given February through April 2013, again discontinued because of a drop in the ejection fraction and lateral S'  (6) on letrozole July 2012 to September 2013  (7) LEFT axillary adenopathy noted by Dr. Marlou Starks on exam July 2013, with biopsy of a left axillary node 04/17/2012 showing an invasive ductal carcinoma, estrogen receptor 91% positive, progesterone receptor 9% positive, with an MIB-1 of 45%, and no HER-2 amplification.  (8) PET scan 04/03/2012 showed multiple  enlarged left axillary and small left supraclavicular lymph nodes that are moderately hypermetabolic, with some hypermetabolic activity within the left axillary tail. No other hypermetabolic lymph nodes identified. There was no involvement of the right axilla, mediastinum or right chest wall and no extrathoracic metastases were demonstrated.  (8) on exemestane and everolimus September through December 2013  (9) s/p RIGHT thoracentesis 08/01/2012, cytologically positive; s/p temporary Pleurx; effusion resolved after Abraxane therapy  (10) Abraxane started 08/16/2012, given day 1 and day 8 of each 21 day cycle, completed 6 cycles (12 doses) 12/06/2012, with excellent response  (11) fulvestrant started 12/19/2012, stopped 04/11/2013 with progression  (12) trastuzumab resumed 12/21/2011. We scheduled the doses 4 weeks apart to see if they caould be better tolerated. However echo 02/04/2013 again showed a drop in the EF and S', so Herceptin stopped (last dose 01/17/2013). Most recent echo, 04/18/2013, shows the ejection fraction to have recovered.  (13) status post left modified radical mastectomy 05/08/2013 for a pT4, pN2a [tumor extended over 15 cm, all 6 sampled lymph nodes were involved) invasive lobular breast cancer, grade 3, which was HER-2 negative. It was estrogen receptor positive at 72%, progesterone receptor negative. Posterior margin was broadly positive  (14) tamoxifen started 05/29/2013, discontinued December 2014 with progression  (15) post-mastectomy irradiation to left chest wall and left [place]iska fossa planned, beginning 07/09/2013  (16) new baseline staging scans including a chest CT and PET scan were obtained 07/02/2013 suggesting progression of disease, but that is when compared to previous scans in August 2013. However  in December of 2014 there was obvious disease progression on the chest wall  (17) Abraxane resumed 08/07/2013, the plan being for treatments day 1 and day 8 of every 21 day  cycle, with restaging after 2 full cycles.   PLAN: Jeanette Morris's pain is consistent with local chest wall involvement--chest wall or possibly rib cage. It seems less likely to be referred from the spine.  I have consulted Dr Sondra Come from [facility] to see if he feels focal treatment to the area in question might be helpful in terms of pain control.  Jeanette Morris is responding to her chemotherapy and is due for treatment tomorrow-- however we will hold off on her chemo until the current problem improves, and specifically, if she is to receive radiation, until those treatments are completed.  Appreciate your care to this patient!  Chauncey Cruel, MD   09/18/2013 12:32 PM

## 2013-09-18 NOTE — Progress Notes (Signed)
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 estrogen receptor positive at 72%, progesterone receptor negative.  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 Patient most recently completed an abbreviated course of radiation therapy directed at the left chest wall axillary and supraclavicular region. She completed 10.8 gray in 6 fractions. Patient elected to discontinue her treatment and was switched to palliative chemotherapy.  The patient is been having pain in the left lower anterior chest region. This worsened significantly on Sunday and earlier in the week the patient was admitted to the hospital for pain related to this issue.  Radiation oncology has been consult for consideration for treatment.                         ALLERGIES:  has No Known Allergies.  Meds: No current facility-administered medications for this encounter.   No current outpatient prescriptions on file.   Facility-Administered Medications Ordered in Other Encounters  Medication Dose Route Frequency Provider Last Rate Last Dose  . 0.9 %  sodium chloride infusion   Intravenous Continuous Shanda Howells, MD 75 mL/hr at 09/17/13 2231    . calcium-vitamin D (OSCAL WITH D) 500-200 MG-UNIT per tablet 1 tablet  1 tablet Oral q morning - 10a Shanda Howells, MD   1 tablet at 09/18/13 1007  . carvedilol (COREG) tablet 18.75 mg  18.75 mg Oral BID WC Shanda Howells, MD   18.75 mg at 09/18/13 1708  . cyanocobalamin  tablet 500 mcg  500 mcg Oral Q breakfast Shanda Howells, MD   500 mcg at 09/18/13 0748  . ferrous sulfate tablet 325 mg  325 mg Oral Q breakfast Shanda Howells, MD   325 mg at 09/18/13 0748  . gabapentin (NEURONTIN) capsule 300 mg  300 mg Oral QHS Shanda Howells, MD   300 mg at 09/17/13 2336  . heparin injection 5,000 Units  5,000 Units Subcutaneous Q8H Shanda Howells, MD   5,000 Units at 09/18/13 1450  . HYDROmorphone (DILAUDID) injection 1-2 mg  1-2 mg Intravenous Q2H PRN Shanda Howells, MD   1 mg at 09/18/13 1714  . ondansetron (ZOFRAN) tablet 8 mg  8 mg Oral Q8H PRN Shanda Howells, MD   8 mg at 09/18/13 2035  . OxyCODONE (OXYCONTIN) 12 hr tablet 20 mg  20 mg Oral Q12H Shanda Howells, MD   20 mg at 09/18/13 1007  . polyethylene glycol (MIRALAX / GLYCOLAX) packet 17 g  17 g Oral Daily Shanda Howells, MD   17 g at 09/18/13 1009  . potassium chloride SA (K-DUR,KLOR-CON) CR tablet 20 mEq  20 mEq Oral Daily Shanda Howells, MD   20 mEq at 09/18/13 1007  . protein supplement (RESOURCE BENEPROTEIN) powder packet 6 g  1 scoop Oral BID WC Velvet Bathe, MD   6 g at 09/18/13 1708  . sodium chloride 0.9 % injection 3 mL  3 mL Intravenous Q12H Shanda Howells, MD  Physical Findings: The patient is in no acute distress. Patient is alert and oriented.  vitals were not taken for this visit..  She is lying in a hospital bed. She is accompanied by her husband this evening. She complains of pain in the left lower anterior chest region. Palpation in this area reveals some discomfort, it does not appear to be related to a rib. I am unable to palpate a subcutaneous metastasis.  Lab Findings: Lab Results  Component Value Date   WBC 13.7* 09/18/2013   HGB 10.4* 09/18/2013   HCT 32.8* 09/18/2013   MCV 91.4 09/18/2013   PLT 230 09/18/2013      Radiographic Findings: Dg Chest 2 View  09/17/2013   CLINICAL DATA:  Left upper quadrant pain.  EXAM: CHEST  2 VIEW  COMPARISON:  08/09/2013  FINDINGS: Port-A-Cath tip in the  upper SVC and in a stable horizontal orientation. Evidence for small bilateral pleural effusions. Subtle densities in the right upper lung could represent asymmetric airspace disease. Heart size is upper limits of normal but unchanged. Surgical clips in the axilla bilaterally. 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  IMPRESSION: Small bilateral pleural effusions.  Subtle lung densities, particularly on the right side. Findings could represent mild edema.   Electronically Signed   By: Markus Daft M.D.   On: 09/17/2013 17:16   Ct Chest W Contrast  09/13/2013   CLINICAL DATA:  History of right breast cancer diagnosed in 2011 and left breast cancer diagnosed in 2014. Chemotherapy in progress. Bilateral arm lymphedema and shortness of breath.  EXAM: CT CHEST WITH CONTRAST  TECHNIQUE: Multidetector CT imaging of the chest was performed during intravenous contrast administration.  CONTRAST:  47m OMNIPAQUE IOHEXOL 300 MG/ML  SOLN  COMPARISON:  NM PET IMAGE RESTAG (PS) SKULL BASE TO THIGH dated 09/13/2013; CT ANGIO CHEST W/CM &/OR WO/CM dated 07/22/2013  FINDINGS: Left subclavian Port-A-Cath tip is unchanged in the upper SVC. 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  There is residual supraclavicular soft tissue fullness bilaterally, hypermetabolic on today's PET-CT and consistent with residual metastatic adenopathy. This has improved from the prior studies, although is not well defined on CT. On the left, this measures approximately 3.5 x 2.4 cm on image 6. A 2.0 cm soft tissue nodule adjacent to the left clavicle on image number 1 is hypermetabolic on PET. The subcutaneous nodularity throughout the left chest wall and axilla has improved. There is less asymmetric enlargement of the left pectoralis muscle.  There are no enlarged mediastinal, hilar or internal mammary lymph nodes. Small bilateral pleural effusions are  present. There is no pericardial effusion. The heart is enlarged. There are diffuse coronary artery calcifications.  Mild dependent atelectasis is present in both lower lobes. There are no suspicious pulmonary nodules.  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 There is no adrenal mass.  Multiple lytic metastases involving the lower thoracic spine have progressed. These are most prominent within the left aspects of T9 through T12 vertebral bodies. There is destruction of the left eleventh rib at the costovertebral junction. There is adjacent left paraspinal tumor extending into the neural foramina. No gross intraspinal extension is identified. Multiple rib metastases are demonstrated with a probable pathologic fracture of the right seventh rib posteriorly. There is also comminuted and displaced fracture of the medial right clavicle.  IMPRESSION: 1. Interval improvement in left chest wall tumor and bilateral supraclavicular nodal metastases. 2. No mediastinal adenopathy  or pulmonary metastases identified. 3. Small bilateral pleural effusions without nodularity or hypermetabolism on today's PET-CT. 4. Multifocal osseous metastatic disease with progression, especially in the lower thoracic spine and left paraspinal soft tissues. Rib and right clavicle fractures are noted.   Electronically Signed   By: Camie Patience M.D.   On: 09/13/2013 14:50   Ct Abdomen Pelvis W Contrast  09/17/2013   CLINICAL DATA:  Left-sided abdominal pain. Vomiting. Breast carcinoma.  EXAM: CT ABDOMEN AND PELVIS WITH CONTRAST  TECHNIQUE: Multidetector CT imaging of the abdomen and pelvis was performed using the standard protocol following bolus administration of intravenous contrast.  CONTRAST:  116m OMNIPAQUE IOHEXOL 300 MG/ML  SOLN  COMPARISON:  PET-CT on 09/13/2013  FINDINGS: Images through the lung bases show no significant change in small to moderate bilateral pleural effusions.  Mild diffuse biliary ductal dilatation is again seen as  well as porcelain gallbladder. No evidence of acute cholecystitis. No liver masses are identified. The pancreas, spleen, adrenal glands, and left kidney are normal in appearance. Severe right hydronephrosis is again demonstrated, however no obstructing calculus or mass is visualized, and this is stable since previous study. A congenital right UPJ obstruction cannot be excluded.  No abdominal or pelvic soft tissue masses or lymphadenopathy identified. No evidence of acute inflammatory process or abnormal fluid collections. Colonic diverticulosis noted, however there is no evidence of diverticulitis. Tiny amount of free fluid noted in pelvic cul-de-sac, which is nonspecific. Mild diffuse body wall edema noted.  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 No other suspicious bone lesions identified.  IMPRESSION: No acute findings within the abdomen or pelvis.  Stable severe right hydronephrosis. Etiology not apparent by CT. Congenital UPJ obstruction cannot be excluded.  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   Electronically Signed   By: JEarle GellM.D.   On: 09/17/2013 18:57   Nm Pet Image Restag (ps) Skull Base To Thigh  09/13/2013   CLINICAL DATA:  Subsequent treatment strategy for bilateral breast cancer.  EXAM: NUCLEAR MEDICINE PET SKULL BASE TO THIGH  FASTING BLOOD GLUCOSE:  Value: 1151mdl  TECHNIQUE: 16.4 mCi F-18 FDG was injected intravenously. CT data was obtained and used for attenuation correction and anatomic localization only. (This was not acquired as a diagnostic CT examination.) Additional exam technical data entered on technologist worksheet.  COMPARISON:  NM PET IMAGE RESTAG (PS) SKULL BASE TO THIGH dated 07/02/2013  FINDINGS: NECK  There is persistent but improved bilateral supraclavicular hypermetabolic adenopathy.These are ill-defined and somewhat  difficult to measure, although measure approximately 2.2 cm on the right (image 45, SUV max 8.4) and 1.9 cm on the left (image 49, SUV max 7.0). There is no upper cervical adenopathy. Asymmetric goiter appears stable without abnormal metabolic activity.  CHEST  There has been interval improvement in the multifocal activity within the left chest wall and axilla. There is a residual 2.5 cm component adjacent to the left clavicle on image 41. There is no mediastinal or hilar hypermetabolic nodal activity. Diffuse muscular activity is again noted within the upper right arm, similar to the prior study. Substernal extension of goiter, coronary artery disease and bilateral pleural effusions are again noted. No residual abnormal activity is seen within the right pleural space.  ABDOMEN/PELVIS  There is no hypermetabolic activity within the liver, adrenal glands, spleen or pancreas. There is no significant residual central mesenteric hypermetabolic  activity. The left external iliac node noted previously has resolved. There is new dilatation of the right renal pelvis and proximal ureter without high-grade ureteral obstruction. Porcelain gallbladder again noted.  SKELETON  Multifocal osseous metastases are again noted. The upper thoracic and rib activity noted previously has improved. However, there is worsening activity within the lower thoracic spine. This corresponds with enlarging lytic lesions involving the left aspects of the T9, T10 and T11 vertebral bodies and posterior elements (CT images 82- 100). There is left paraspinal soft tissue tumor as well as possible epidural tumor, difficult to assess given streak artifact from the patient's arms. Additional lesions are present within the ribs and bony pelvis.  IMPRESSION: 1. There has been improvement in the left chest wall activity and multiple nodal metastasis involving the left axilla and supraclavicular stations bilaterally. 2. Persistent bilateral pleural effusions  without associated abnormal metabolic activity. 3. Fluctuating osseous metastases with improvement in the upper thoracic spine and upper right chest wall. There is worsening disease in the lower left thoracic spine with the potential for epidural tumor in this region. Again, spinal assessment is limited by body habitus. If the patient has focal neurologic symptoms, further evaluation with MRI may be helpful.   Electronically Signed   By: Camie Patience M.D.   On: 09/13/2013 13:58    Impression:  The patient would be a candidate for a short course of radiation therapy directed at her area of discomfort along the left anterior chest. Patient will be brought down to radiation oncology for planning tomorrow. I anticipate approximately 3 treatments.  If a lesion along the chest area cannot be identified on planning CT may have to consider that her disease in the left paraspinal area thoracic area may possibly be causing her symptoms.    ____________________________________ Blair Promise, MD

## 2013-09-18 NOTE — Progress Notes (Signed)
Central tele notified RN that patient had 22 beats of v-tach. Pt was sleeping at this time, denied any distress. K+is 5.0 this am, b/p is 124/56, 72,18, 100% sat, temp 98. Paged Dr. Ernestina Patches, awaiting his call back. Will cont to monitor patient.

## 2013-09-19 ENCOUNTER — Encounter: Payer: PRIVATE HEALTH INSURANCE | Admitting: Physical Therapy

## 2013-09-19 ENCOUNTER — Ambulatory Visit: Payer: PRIVATE HEALTH INSURANCE | Admitting: Radiation Oncology

## 2013-09-19 ENCOUNTER — Ambulatory Visit
Admit: 2013-09-19 | Discharge: 2013-09-19 | Disposition: A | Discharge status: Home / Self Care | Payer: PRIVATE HEALTH INSURANCE | Attending: Radiation Oncology | Admitting: Radiation Oncology

## 2013-09-19 DIAGNOSIS — C773 Secondary and unspecified malignant neoplasm of axilla and upper limb lymph nodes: Secondary | ICD-10-CM | POA: Insufficient documentation

## 2013-09-19 DIAGNOSIS — R109 Unspecified abdominal pain: Secondary | ICD-10-CM | POA: Diagnosis not present

## 2013-09-19 DIAGNOSIS — C7951 Secondary malignant neoplasm of bone: Secondary | ICD-10-CM | POA: Insufficient documentation

## 2013-09-19 DIAGNOSIS — C7952 Secondary malignant neoplasm of bone marrow: Secondary | ICD-10-CM

## 2013-09-19 DIAGNOSIS — Z510 Encounter for antineoplastic radiation therapy: Secondary | ICD-10-CM | POA: Insufficient documentation

## 2013-09-19 DIAGNOSIS — C50919 Malignant neoplasm of unspecified site of unspecified female breast: Secondary | ICD-10-CM | POA: Insufficient documentation

## 2013-09-19 DIAGNOSIS — I10 Essential (primary) hypertension: Secondary | ICD-10-CM

## 2013-09-19 DIAGNOSIS — C771 Secondary and unspecified malignant neoplasm of intrathoracic lymph nodes: Secondary | ICD-10-CM | POA: Insufficient documentation

## 2013-09-19 DIAGNOSIS — N179 Acute kidney failure, unspecified: Secondary | ICD-10-CM

## 2013-09-19 DIAGNOSIS — Z79899 Other long term (current) drug therapy: Secondary | ICD-10-CM | POA: Insufficient documentation

## 2013-09-19 DIAGNOSIS — C50412 Malignant neoplasm of upper-outer quadrant of left female breast: Secondary | ICD-10-CM

## 2013-09-19 LAB — URINE CULTURE
Colony Count: NO GROWTH
Culture: NO GROWTH

## 2013-09-19 LAB — CBC WITH DIFFERENTIAL/PLATELET
BASOS PCT: 0 % (ref 0–1)
Basophils Absolute: 0 10*3/uL (ref 0.0–0.1)
Eosinophils Absolute: 0 10*3/uL (ref 0.0–0.7)
Eosinophils Relative: 0 % (ref 0–5)
HCT: 31.3 % — ABNORMAL LOW (ref 36.0–46.0)
Hemoglobin: 9.6 g/dL — ABNORMAL LOW (ref 12.0–15.0)
LYMPHS ABS: 1.5 10*3/uL (ref 0.7–4.0)
Lymphocytes Relative: 14 % (ref 12–46)
MCH: 28.1 pg (ref 26.0–34.0)
MCHC: 30.7 g/dL (ref 30.0–36.0)
MCV: 91.5 fL (ref 78.0–100.0)
Monocytes Absolute: 0.9 10*3/uL (ref 0.1–1.0)
Monocytes Relative: 8 % (ref 3–12)
NEUTROS PCT: 78 % — AB (ref 43–77)
Neutro Abs: 8.5 10*3/uL — ABNORMAL HIGH (ref 1.7–7.7)
PLATELETS: 179 10*3/uL (ref 150–400)
RBC: 3.42 MIL/uL — ABNORMAL LOW (ref 3.87–5.11)
RDW: 15.3 % (ref 11.5–15.5)
WBC: 10.9 10*3/uL — ABNORMAL HIGH (ref 4.0–10.5)

## 2013-09-19 LAB — COMPREHENSIVE METABOLIC PANEL
ALBUMIN: 2.4 g/dL — AB (ref 3.5–5.2)
ALK PHOS: 85 U/L (ref 39–117)
ALT: 8 U/L (ref 0–35)
AST: 19 U/L (ref 0–37)
BUN: 11 mg/dL (ref 6–23)
CHLORIDE: 96 meq/L (ref 96–112)
CO2: 29 meq/L (ref 19–32)
CREATININE: 1.35 mg/dL — AB (ref 0.50–1.10)
Calcium: 8.5 mg/dL (ref 8.4–10.5)
GFR calc Af Amer: 45 mL/min — ABNORMAL LOW (ref >90)
GFR, EST NON AFRICAN AMERICAN: 39 mL/min — AB (ref >90)
Glucose, Bld: 107 mg/dL — ABNORMAL HIGH (ref 70–99)
POTASSIUM: 5 meq/L (ref 3.7–5.3)
Sodium: 132 mEq/L — ABNORMAL LOW (ref 137–147)
Total Bilirubin: 0.3 mg/dL (ref 0.3–1.2)
Total Protein: 6.4 g/dL (ref 6.0–8.3)

## 2013-09-19 NOTE — Progress Notes (Signed)
[facility] PROGRESS NOTE  Jeanette Morris XVQ:[id number] DOB: 1945-10-04 DOA: 09/17/2013 PCP: [name], NP  Assessment/Plan:  Abdominal/Thoracic pain - Oncology on board. Current discomfort consistent with local chest wall involvement, radiology oncology and plans for 3 treatments of short course radiation therapy directed at area of discomfort. - Otherwise continue supportive therapy: Pain control and anti-emetics as needed  Leukocytosis - Could be stress reaction from principal problem. Patient has been afebrile and currently trending down off of antibiotics.  Code Status: full Family Communication: None Disposition Plan: Per oncology   Consultants:  Oncology  Oncology radiology   Antibiotics:  None  HPI/Subjective: Patient still complaining of some discomfort. No new complaints overnight.  Objective: Filed Vitals:   09/19/13 1500  BP: 133/53  Pulse: 66  Temp: 98.1 F (36.7 C)  Resp: 20    Intake/Output Summary (Last 24 hours) at 09/19/13 1725 Last data filed at 09/19/13 0751  Gross per 24 hour  Intake 1626.75 ml  Output    950 ml  Net 676.75 ml   Filed Weights   09/17/13 2319  Weight: 98.022 kg (216 lb 1.6 oz)    Exam:   General:  Pt in NAD, Alert and awake  Cardiovascular: RRR, no MRG  Respiratory: CTA BL, no wheezes  Abdomen: soft, NT, ND  Musculoskeletal: no cyanosis or clubbing   Data Reviewed: Basic Metabolic Panel:  Recent Labs Lab 09/17/13 1645 09/18/13 0333 09/19/13 0615  NA 133* 135* 132*  K 5.2 5.0 5.0  CL 94* 96 96  CO2 30 31 29   GLUCOSE 132* 79 107*  BUN 12 12 11   CREATININE 1.37* 1.36* 1.35*  CALCIUM 9.0 8.9 8.5   Liver Function Tests:  Recent Labs Lab 09/17/13 1645 09/18/13 0333 09/19/13 0615  AST 27 23 19   ALT 11 10 8   ALKPHOS 100 96 85  BILITOT 0.2* 0.3 0.3  PROT 7.0 6.8 6.4  ALBUMIN 2.7* 2.7* 2.4*    Recent Labs Lab 09/17/13 1645  LIPASE 22   No results found for this basename:  AMMONIA,  in the last 168 hours CBC:  Recent Labs Lab 09/17/13 1645 09/18/13 0333 09/19/13 0615  WBC 11.8* 13.7* 10.9*  NEUTROABS  --  11.2* 8.5*  HGB 10.9* 10.4* 9.6*  HCT 33.4* 32.8* 31.3*  MCV 89.8 91.4 91.5  PLT 234 230 179   Cardiac Enzymes: No results found for this basename: CKTOTAL, CKMB, CKMBINDEX, TROPONINI,  in the last 168 hours BNP (last 3 results)  Recent Labs  08/03/13 2100  PROBNP 612.9*   CBG:  Recent Labs Lab 09/13/13 1121  GLUCAP 112*    Recent Results (from the past 240 hour(s))  URINE CULTURE     Status: None   Collection Time    09/17/13 10:24 PM      Result Value Range Status   Specimen Description URINE, RANDOM   Final   Special Requests NONE   Final   Culture  Setup Time     Final   Value: 09/18/2013 01:45     Performed at [place]     Final   Value: NO GROWTH     Performed at [facility]   Culture     Final   Value: NO GROWTH     Performed at [facility]   Report Status 09/19/2013 FINAL   Final     Studies: Ct Abdomen Pelvis W Contrast  09/17/2013   CLINICAL DATA:  Left-sided  abdominal pain. Vomiting. Breast carcinoma.  EXAM: CT ABDOMEN AND PELVIS WITH CONTRAST  TECHNIQUE: Multidetector CT imaging of the abdomen and pelvis was performed using the standard protocol following bolus administration of intravenous contrast.  CONTRAST:  158mL OMNIPAQUE IOHEXOL 300 MG/ML  SOLN  COMPARISON:  PET-CT on 09/13/2013  FINDINGS: Images through the lung bases show no significant change in small to moderate bilateral pleural effusions.  Mild diffuse biliary ductal dilatation is again seen as well as porcelain gallbladder. No evidence of acute cholecystitis. No liver masses are identified. The pancreas, spleen, adrenal glands, and left kidney are normal in appearance. Severe right hydronephrosis is again demonstrated, however no obstructing calculus or mass is visualized, and this is stable since previous  study. A congenital right UPJ obstruction cannot be excluded.  No abdominal or pelvic soft tissue masses or lymphadenopathy identified. No evidence of acute inflammatory process or abnormal fluid collections. Colonic diverticulosis noted, however there is no evidence of diverticulitis. Tiny amount of free fluid noted in pelvic cul-de-sac, which is nonspecific. Mild diffuse body wall edema noted.  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 No other suspicious bone lesions identified.  IMPRESSION: No acute findings within the abdomen or pelvis.  Stable severe right hydronephrosis. Etiology not apparent by CT. Congenital UPJ obstruction cannot be excluded.  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   Electronically Signed   By: Earle Gell M.D.   On: 09/17/2013 18:57    Scheduled Meds: . calcium-vitamin D  1 tablet Oral q morning - 10a  . carvedilol  18.75 mg Oral BID WC  . cyanocobalamin  500 mcg Oral Q breakfast  . ferrous sulfate  325 mg Oral Q breakfast  . gabapentin  300 mg Oral QHS  . heparin  5,000 Units Subcutaneous Q8H  . OxyCODONE  20 mg Oral Q12H  . polyethylene glycol  17 g Oral Daily  . potassium chloride SA  20 mEq Oral Daily  . protein supplement  1 scoop Oral BID WC  . sodium chloride  3 mL Intravenous Q12H   Continuous Infusions: . sodium chloride 75 mL/hr at 09/19/13 1159    Active Problems:    Time spent: > 35 minutes    Velvet Bathe  [facility] Pager [phone]. If 7PM-7AM, please contact night-coverage at [email or web address], password [email or web address] 09/19/2013, 5:25 PM  LOS: 2 days

## 2013-09-19 NOTE — Progress Notes (Signed)
Jeanette Morris   DOB:May 11, 1946   EX#:[id number]   SWH#:[id number]  Subjective: feeling a little better, though pain about the same; last BM 48 h ago; siting in recliner several hours/day. No family in room   Objective: middle aged African American woman examined in bed  Filed Vitals:   09/19/13 0507  BP: 131/56  Pulse: 76  Temp: 98.6 F (37 C)  Resp: 18    Body mass index is 37.08 kg/(m^2).  Intake/Output Summary (Last 24 hours) at 09/19/13 0817 Last data filed at 09/19/13 0751  Gross per 24 hour  Intake 2226.75 ml  Output   1450 ml  Net 776.75 ml     Sclerae unicteric  No cervical or [place] adenopathy  Lungs clear -- no rales or rhonchi  Heart regular rate and rhythm  Abdomen obese,+BS  MSK no focal spinal tenderness but pain in LUQ/ lower rib cage as before  Neuro nonfocal, well oriented, positive affect  Breast exam: deferred  CBG (last 3)  No results found for this basename: GLUCAP,  in the last 72 hours   Labs:  Lab Results  Component Value Date   WBC 10.9* 09/19/2013   HGB 9.6* 09/19/2013   HCT 31.3* 09/19/2013   MCV 91.5 09/19/2013   PLT 179 09/19/2013   NEUTROABS 8.5* 09/19/2013    '@LASTCHEMISTRY'$ @  Urine Studies No results found for this basename: UACOL, UAPR, USPG, UPH, UTP, UGL, UKET, UBIL, UHGB, UNIT, UROB, ULEU, UEPI, UWBC, URBC, UBAC, CAST, CRYS, UCOM, BILUA,  in the last 72 hours  Basic Metabolic Panel:  Recent Labs Lab 09/12/13 1250  09/17/13 1645 09/18/13 0333 09/19/13 0615  NA 134*  --  133* 135* 132*  K 4.5  < > 5.2 5.0 5.0  CL  --   --  94* 96 96  CO2 31*  --  $R'30 31 29  'DF$ GLUCOSE 118  --  132* 79 107*  BUN 12.4  --  $R'12 12 11  'KN$ CREATININE 1.4*  --  1.37* 1.36* 1.35*  CALCIUM 8.7  --  9.0 8.9 8.5  < > = values in this interval not displayed. GFR Estimated Creatinine Clearance: 44.7 ml/min (by C-G formula based on Cr of 1.35). Liver Function Tests:  Recent Labs Lab 09/12/13 1250 09/17/13 1645 09/18/13 0333 09/19/13 0615  AST $Re'23 27 23 19   'sCT$ ALT $R'19 11 10 8  'ba$ ALKPHOS 106 100 96 85  BILITOT 0.30 0.2* 0.3 0.3  PROT 6.9 7.0 6.8 6.4  ALBUMIN 2.7* 2.7* 2.7* 2.4*    Recent Labs Lab 09/17/13 1645  LIPASE 22   No results found for this basename: AMMONIA,  in the last 168 hours Coagulation profile No results found for this basename: INR, PROTIME,  in the last 168 hours  CBC:  Recent Labs Lab 09/12/13 1249 09/17/13 1645 09/18/13 0333 09/19/13 0615  WBC 8.2 11.8* 13.7* 10.9*  NEUTROABS 6.3  --  11.2* 8.5*  HGB 8.2* 10.9* 10.4* 9.6*  HCT 25.0* 33.4* 32.8* 31.3*  MCV 90.8 89.8 91.4 91.5  PLT 243 234 230 179   Cardiac Enzymes: No results found for this basename: CKTOTAL, CKMB, CKMBINDEX, TROPONINI,  in the last 168 hours BNP: No components found with this basename: POCBNP,  CBG:  Recent Labs Lab 09/13/13 1121  GLUCAP 112*   D-Dimer No results found for this basename: DDIMER,  in the last 72 hours Hgb A1c No results found for this basename: HGBA1C,  in the last 72 hours Lipid Profile No results found  for this basename: CHOL, HDL, LDLCALC, TRIG, CHOLHDL, LDLDIRECT,  in the last 72 hours Thyroid function studies No results found for this basename: TSH, T4TOTAL, FREET3, T3FREE, THYROIDAB,  in the last 72 hours Anemia work up No results found for this basename: VITAMINB12, FOLATE, FERRITIN, TIBC, IRON, RETICCTPCT,  in the last 72 hours Microbiology Recent Results (from the past 240 hour(s))  URINE CULTURE     Status: None   Collection Time    09/17/13 10:24 PM      Result Value Range Status   Specimen Description URINE, RANDOM   Final   Special Requests NONE   Final   Culture  Setup Time     Final   Value: 09/18/2013 01:45     Performed at [place] [facility]     Final   Value: NO GROWTH     Performed at [facility]   Culture     Final   Value: NO GROWTH     Performed at [facility]   Report Status 09/19/2013 FINAL   Final      Studies:  Dg Chest 2  View  09/17/2013   CLINICAL DATA:  Left upper quadrant pain.  EXAM: CHEST  2 VIEW  COMPARISON:  08/09/2013  FINDINGS: Port-A-Cath tip in the upper SVC and in a stable horizontal orientation. Evidence for small bilateral pleural effusions. Subtle densities in the right upper lung could represent asymmetric airspace disease. Heart size is upper limits of normal but unchanged. Surgical clips in the axilla bilaterally. 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  IMPRESSION: Small bilateral pleural effusions.  Subtle lung densities, particularly on the right side. Findings could represent mild edema.   Electronically Signed   By: Markus Daft M.D.   On: 09/17/2013 17:16   Ct Abdomen Pelvis W Contrast  09/17/2013   CLINICAL DATA:  Left-sided abdominal pain. Vomiting. Breast carcinoma.  EXAM: CT ABDOMEN AND PELVIS WITH CONTRAST  TECHNIQUE: Multidetector CT imaging of the abdomen and pelvis was performed using the standard protocol following bolus administration of intravenous contrast.  CONTRAST:  140mL OMNIPAQUE IOHEXOL 300 MG/ML  SOLN  COMPARISON:  PET-CT on 09/13/2013  FINDINGS: Images through the lung bases show no significant change in small to moderate bilateral pleural effusions.  Mild diffuse biliary ductal dilatation is again seen as well as porcelain gallbladder. No evidence of acute cholecystitis. No liver masses are identified. The pancreas, spleen, adrenal glands, and left kidney are normal in appearance. Severe right hydronephrosis is again demonstrated, however no obstructing calculus or mass is visualized, and this is stable since previous study. A congenital right UPJ obstruction cannot be excluded.  No abdominal or pelvic soft tissue masses or lymphadenopathy identified. No evidence of acute inflammatory process or abnormal fluid collections. Colonic diverticulosis noted, however there is no evidence of diverticulitis. Tiny amount of free fluid noted in pelvic  cul-de-sac, which is nonspecific. Mild diffuse body wall edema noted.  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 No other suspicious bone lesions identified.  IMPRESSION: No acute findings within the abdomen or pelvis.  Stable severe right hydronephrosis. Etiology not apparent by CT. Congenital UPJ obstruction cannot be excluded.  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   Electronically Signed   By: Earle Gell M.D.   On: 09/17/2013 18:57  Assessment: 68 y.o. 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  (2) neoadjuvant chemotherapy consisted of 6 cycles of standard carboplatin, docetaxel, trastuzumab, with the trastuzumab held cycles 5 and 6 due to a drop in her borderline ejection fraction (from 45-50% at baseline to 40-45% January 2012)-- total trastuzumab treatment = 2 months  (3) status post right mastectomy and axillary lymph node dissection 12/01/2010, showing no residual invasive carcinoma in the breast, but residual tumor in 20 of 21 axillary lymph nodes. (Only one of the 20 positive lymph nodes showed a significant residual tumor, measuring 5 mm; the remaining lymph nodes, including the evidence of extracapsular extension, showed single and clusters a few neoplastic cells with extensive therapy related changes).  (4) postmastectomy radiation to the right chest wall, axilla, and supraclavicular region completed 03/07/2011  (5) additional 2 months of trastuzumab given February through April 2013, again discontinued because of a drop in the ejection fraction and  lateral S'  (6) on letrozole July 2012 to September 2013  (7) LEFT axillary adenopathy noted by Dr. Marlou Starks on exam July 2013, with biopsy of a left axillary node 04/17/2012 showing an invasive ductal carcinoma, estrogen receptor 91% positive, progesterone receptor 9% positive, with an MIB-1 of 45%, and no HER-2 amplification.  (8) PET scan 04/03/2012 showed multiple enlarged left axillary and small left supraclavicular lymph nodes that are moderately hypermetabolic, with some hypermetabolic activity within the left axillary tail. No other hypermetabolic lymph nodes identified. There was no involvement of the right axilla, mediastinum or right chest wall and no extrathoracic metastases were demonstrated.  (8) on exemestane and everolimus September through December 2013  (9) s/p RIGHT thoracentesis 08/01/2012, cytologically positive; s/p temporary Pleurx; effusion resolved after Abraxane therapy  (10) Abraxane started 08/16/2012, given day 1 and day 8 of each 21 day cycle, completed 6 cycles (12 doses) 12/06/2012, with excellent response  (11) fulvestrant started 12/19/2012, stopped 04/11/2013 with progression  (12) trastuzumab resumed 12/21/2011. We scheduled the doses 4 weeks apart to see if they caould be better tolerated. However echo 02/04/2013 again showed a drop in the EF and S', so Herceptin stopped (last dose 01/17/2013). Most recent echo, 04/18/2013, shows the ejection fraction to have recovered.  (13) status post left modified radical mastectomy 05/08/2013 for a pT4, pN2a [tumor extended over 15 cm, all 6 sampled lymph nodes were involved) invasive lobular breast cancer, grade 3, which was HER-2 negative. It was estrogen receptor positive at 72%, progesterone receptor negative. Posterior margin was broadly positive  (14) tamoxifen started 05/29/2013, discontinued December 2014 with progression  (15) post-mastectomy irradiation to left chest wall and left [place] fossa planned, beginning 07/09/2013   (16) new baseline staging scans including a chest CT and PET scan were obtained 07/02/2013 suggesting progression of disease, but that is when compared to previous scans in August 2013. However in December of 2014 there was obvious disease progression on the chest wall  (17) Abraxane resumed 08/07/2013   Plan: Dr Clabe Seal plan noted. Jeanette Morris is scheduled for her next chemotherapy 1/28 and already has labs and visit scheduled for teat date. Will sign off at this point. Please let me know if I can be of further help.   Chauncey Cruel, MD 09/19/2013  8:17 AM

## 2013-09-19 NOTE — Progress Notes (Signed)
At beginning of shift, patient complained of numbness in digits 3-5 on R hand.  She stated, "It feels like my hand is asleep, like I hit my funny bone."  RUE repositioned.  Pulse 2+ on RUE, no increased swelling noted.  Capillary refill was <3 seconds.  Pt slept throughout the night.  At this time on 1/22/ 0600, patient states the hand continues to feel numb.  No increased swelling/edema noted.  2+ pulse noted.  Jonette Eva, NP notified.  Jonette Eva stated she will discuss with primary physician in AM.  Pt then reported that this has been an ongoing issue "for quite some time".  Jonette Eva was informed of this new information.  Pt requested for a wash cloth to squeeze in the R hand which is her typical treatment for this pain at home.  Wash cloth given per request as well as 1 mg IV dilaudid.  No other concerns at this time.  Will continue to monitor.  Vitals stable.  Patient resting comfortably.  Iantha Fallen RN 6:12 AM 09/19/2013

## 2013-09-19 NOTE — Progress Notes (Signed)
  Radiation Oncology         [phone] ________________________________  Name: Jeanette Morris MRN: [id number]  Date: 09/19/2013  DOB: 02-04-1946  SIMULATION AND TREATMENT PLANNING NOTE - INPATIENT  DIAGNOSIS:  Metastatic breast cancer  NARRATIVE:  The patient was brought to the [facility].  Identity was confirmed.  All relevant records and images related to the planned course of therapy were reviewed.  The patient freely provided informed written consent to proceed with treatment after reviewing the details related to the planned course of therapy. The consent form was witnessed and verified by the simulation staff.  Then, the patient was set-up in a stable reproducible  supine position for radiation therapy.  CT images were obtained.  Surface markings were placed.  The CT images were loaded into the planning software.  Then the target and avoidance structures were contoured.  Treatment planning then occurred.  The radiation prescription was entered and confirmed.  Then, I designed and supervised the construction of a total of 2 medically necessary complex treatment devices.  I have requested : Electron Plan.  I have ordered: A special port plan  PLAN:  The patient will receive 12 Gy in 3 fractions using 12 MeV electrons prescribed to the 100% isodose line, directed at the painful subcutaneous area using a custom electron cutout field.  ________________________________  -----------------------------------  Blair Promise, PhD, MD

## 2013-09-20 ENCOUNTER — Ambulatory Visit
Admit: 2013-09-20 | Discharge: 2013-09-20 | Disposition: A | Discharge status: Home / Self Care | Payer: PRIVATE HEALTH INSURANCE | Attending: Radiation Oncology | Admitting: Radiation Oncology

## 2013-09-20 DIAGNOSIS — C800 Disseminated malignant neoplasm, unspecified: Secondary | ICD-10-CM

## 2013-09-20 DIAGNOSIS — R109 Unspecified abdominal pain: Secondary | ICD-10-CM | POA: Diagnosis not present

## 2013-09-20 LAB — CBC WITH DIFFERENTIAL/PLATELET
Basophils Absolute: 0 10*3/uL (ref 0.0–0.1)
Basophils Relative: 0 % (ref 0–1)
Eosinophils Absolute: 0 10*3/uL (ref 0.0–0.7)
Eosinophils Relative: 0 % (ref 0–5)
HEMATOCRIT: 29.3 % — AB (ref 36.0–46.0)
Hemoglobin: 9.3 g/dL — ABNORMAL LOW (ref 12.0–15.0)
LYMPHS PCT: 13 % (ref 12–46)
Lymphs Abs: 1.2 10*3/uL (ref 0.7–4.0)
MCH: 29.1 pg (ref 26.0–34.0)
MCHC: 31.7 g/dL (ref 30.0–36.0)
MCV: 91.6 fL (ref 78.0–100.0)
MONO ABS: 1 10*3/uL (ref 0.1–1.0)
Monocytes Relative: 11 % (ref 3–12)
NEUTROS ABS: 6.9 10*3/uL (ref 1.7–7.7)
Neutrophils Relative %: 76 % (ref 43–77)
Platelets: 176 10*3/uL (ref 150–400)
RBC: 3.2 MIL/uL — ABNORMAL LOW (ref 3.87–5.11)
RDW: 15.4 % (ref 11.5–15.5)
WBC: 9.1 10*3/uL (ref 4.0–10.5)

## 2013-09-20 LAB — COMPREHENSIVE METABOLIC PANEL
ALBUMIN: 2.2 g/dL — AB (ref 3.5–5.2)
ALK PHOS: 76 U/L (ref 39–117)
ALT: 7 U/L (ref 0–35)
AST: 18 U/L (ref 0–37)
BUN: 10 mg/dL (ref 6–23)
CALCIUM: 8.5 mg/dL (ref 8.4–10.5)
CO2: 29 mEq/L (ref 19–32)
Chloride: 98 mEq/L (ref 96–112)
Creatinine, Ser: 1.36 mg/dL — ABNORMAL HIGH (ref 0.50–1.10)
GFR calc Af Amer: 45 mL/min — ABNORMAL LOW (ref >90)
GFR calc non Af Amer: 39 mL/min — ABNORMAL LOW (ref >90)
Glucose, Bld: 90 mg/dL (ref 70–99)
POTASSIUM: 5 meq/L (ref 3.7–5.3)
SODIUM: 133 meq/L — AB (ref 137–147)
TOTAL PROTEIN: 6 g/dL (ref 6.0–8.3)
Total Bilirubin: 0.3 mg/dL (ref 0.3–1.2)

## 2013-09-20 MED ORDER — SODIUM CHLORIDE 0.9 % IJ SOLN
10.0000 mL | INTRAMUSCULAR | Status: DC | PRN
  Administered 2013-09-20 – 2013-09-23 (×4): 10 mL

## 2013-09-20 MED ORDER — ONDANSETRON HCL 4 MG/2ML IJ SOLN
4.0000 mg | Freq: Once | INTRAMUSCULAR | Status: AC
  Administered 2013-09-20: 4 mg via INTRAVENOUS
  Filled 2013-09-20: qty 2

## 2013-09-20 NOTE — Progress Notes (Signed)
Notified from CCM that patient in complete HB. Patient asymptomatic. BP stable. Pulse 77. Pt complains 4/10 pain located on left abdomen. Will continue to monitor and print strip for MD. MD notified.

## 2013-09-20 NOTE — Progress Notes (Signed)
Spoke to the cancer center and the patient is scheduled for radiation 1/23 at 5:35pm, Monday 1/26 at 12:50pm and Tuesday 1/27 at 12:00.

## 2013-09-20 NOTE — Progress Notes (Signed)
[facility] PROGRESS NOTE  Jeanette Morris IDP:[id number] DOB: 04/22/1946 DOA: 09/17/2013 PCP: [name], NP  Assessment/Plan:  Abdominal/Thoracic pain - Oncology on board. Current discomfort consistent with local chest wall involvement, radiology oncology and plans for 3 treatments of short course radiation therapy directed at area of discomfort. First treatment today 09/20/13. Other treatments scheduled for Monday and Tuesday. - Otherwise continue supportive therapy: Pain control and anti-emetics as needed  Leukocytosis - Resolved  Numbness to Right 2-4 digit - probable carpel tunnel syndrome - Pt encouraged to wear splint - PT/OT following - pt to follow up with PCP upon discharge for further evaluation and workup   Code Status: full Family Communication: None at bedside Disposition Plan: Per oncology    Consultants:  Oncology  Oncology radiology  Antibiotics:  None  HPI/Subjective: Patient still complaining of some abdominal discomfort.  Objective: Filed Vitals:   09/20/13 0706  BP: 147/61  Pulse: 77  Temp: 98.7 F (37.1 C)  Resp: 18    Intake/Output Summary (Last 24 hours) at 09/20/13 1452 Last data filed at 09/20/13 1303  Gross per 24 hour  Intake   1575 ml  Output   2150 ml  Net   -575 ml   Filed Weights   09/17/13 2319  Weight: 98.022 kg (216 lb 1.6 oz)    Exam:   General:  Pt in NAD, Alert, awake, and oreinted  Cardiovascular: RRR, no MRG  Respiratory: CTA BL, no wheezes  Abdomen: soft, ND  Musculoskeletal: no cyanosis, edema, or clubbing.   Data Reviewed: Basic Metabolic Panel:  Recent Labs Lab 09/17/13 1645 09/18/13 0333 09/19/13 0615 09/20/13 0415  NA 133* 135* 132* 133*  K 5.2 5.0 5.0 5.0  CL 94* 96 96 98  CO2 30 31 29 29   GLUCOSE 132* 79 107* 90  BUN 12 12 11 10   CREATININE 1.37* 1.36* 1.35* 1.36*  CALCIUM 9.0 8.9 8.5 8.5   Liver Function Tests:  Recent Labs Lab 09/17/13 1645 09/18/13 0333  09/19/13 0615 09/20/13 0415  AST 27 23 19 18   ALT 11 10 8 7   ALKPHOS 100 96 85 76  BILITOT 0.2* 0.3 0.3 0.3  PROT 7.0 6.8 6.4 6.0  ALBUMIN 2.7* 2.7* 2.4* 2.2*    Recent Labs Lab 09/17/13 1645  LIPASE 22   No results found for this basename: AMMONIA,  in the last 168 hours CBC:  Recent Labs Lab 09/17/13 1645 09/18/13 0333 09/19/13 0615 09/20/13 0415  WBC 11.8* 13.7* 10.9* 9.1  NEUTROABS  --  11.2* 8.5* 6.9  HGB 10.9* 10.4* 9.6* 9.3*  HCT 33.4* 32.8* 31.3* 29.3*  MCV 89.8 91.4 91.5 91.6  PLT 234 230 179 176   Cardiac Enzymes: No results found for this basename: CKTOTAL, CKMB, CKMBINDEX, TROPONINI,  in the last 168 hours BNP (last 3 results)  Recent Labs  08/03/13 2100  PROBNP 612.9*   CBG: No results found for this basename: GLUCAP,  in the last 168 hours  Recent Results (from the past 240 hour(s))  URINE CULTURE     Status: None   Collection Time    09/17/13 10:24 PM      Result Value Range Status   Specimen Description URINE, RANDOM   Final   Special Requests NONE   Final   Culture  Setup Time     Final   Value: 09/18/2013 01:45     Performed at [facility]     Final   Value:  NO GROWTH     Performed at [facility]   Culture     Final   Value: NO GROWTH     Performed at [facility]   Report Status 09/19/2013 FINAL   Final     Studies: No results found.  Scheduled Meds: . calcium-vitamin D  1 tablet Oral q morning - 10a  . carvedilol  18.75 mg Oral BID WC  . cyanocobalamin  500 mcg Oral Q breakfast  . ferrous sulfate  325 mg Oral Q breakfast  . gabapentin  300 mg Oral QHS  . heparin  5,000 Units Subcutaneous Q8H  . OxyCODONE  20 mg Oral Q12H  . polyethylene glycol  17 g Oral Daily  . potassium chloride SA  20 mEq Oral Daily  . protein supplement  1 scoop Oral BID WC  . sodium chloride  3 mL Intravenous Q12H   Continuous Infusions:   Active Problems:    Time spent: > 35 minutes   Larwance Sachs  [facility] Pager [phone]. If 7PM-7AM, please contact night-coverage at [email or web address], password [facility] 09/20/2013, 2:52 PM  LOS: 3 days

## 2013-09-20 NOTE — Progress Notes (Signed)
PT Cancellation Note  Patient Details Name: Jeanette Morris MRN: [id number] DOB: 09-09-1945   Cancelled Treatment:    Reason Eval/Treat Not Completed: Pain limiting ability to participate. Pt reports she's not up to walking due to abdominal pain. RN aware. Will follow.   Blondell Reveal Kistler 09/20/2013, 2:02 PM [phone]

## 2013-09-20 NOTE — Progress Notes (Signed)
COURTESY NOTE: Rad Onc plan noted, will complete XRT 1/27 (3 doses). While in hosp a PT/OT eval may be useful and have written for that. She also c/o numbness R 2-4 digits. This is not clearly radial or ulnar in distribution. It may be due to CTS. Have written for R wrist splint.  Will follow w. You on 1/26. Please let my partners know if we can be of help this weekend

## 2013-09-20 NOTE — Progress Notes (Signed)
Paged ortho tech to have splint placed.  Lenise Herald

## 2013-09-20 NOTE — Progress Notes (Signed)
[facility] Therapy Treatment Record Phone [phone]   [id number] Therapy was administered to Jeanette Morris on: 09/20/2013  2:42 PM and was treatment # 1 out of a planned course of 3 treatments.

## 2013-09-21 DIAGNOSIS — I509 Heart failure, unspecified: Secondary | ICD-10-CM

## 2013-09-21 DIAGNOSIS — R109 Unspecified abdominal pain: Secondary | ICD-10-CM | POA: Diagnosis not present

## 2013-09-21 LAB — CBC WITH DIFFERENTIAL/PLATELET
Basophils Absolute: 0 10*3/uL (ref 0.0–0.1)
Basophils Relative: 0 % (ref 0–1)
EOS ABS: 0 10*3/uL (ref 0.0–0.7)
EOS PCT: 0 % (ref 0–5)
HCT: 31.4 % — ABNORMAL LOW (ref 36.0–46.0)
Hemoglobin: 9.6 g/dL — ABNORMAL LOW (ref 12.0–15.0)
Lymphocytes Relative: 13 % (ref 12–46)
Lymphs Abs: 1.1 10*3/uL (ref 0.7–4.0)
MCH: 28.2 pg (ref 26.0–34.0)
MCHC: 30.6 g/dL (ref 30.0–36.0)
MCV: 92.4 fL (ref 78.0–100.0)
Monocytes Absolute: 0.8 10*3/uL (ref 0.1–1.0)
Monocytes Relative: 10 % (ref 3–12)
Neutro Abs: 6.1 10*3/uL (ref 1.7–7.7)
Neutrophils Relative %: 76 % (ref 43–77)
PLATELETS: 163 10*3/uL (ref 150–400)
RBC: 3.4 MIL/uL — ABNORMAL LOW (ref 3.87–5.11)
RDW: 15.2 % (ref 11.5–15.5)
WBC: 8.1 10*3/uL (ref 4.0–10.5)

## 2013-09-21 LAB — COMPREHENSIVE METABOLIC PANEL
ALBUMIN: 2.4 g/dL — AB (ref 3.5–5.2)
ALT: 6 U/L (ref 0–35)
AST: 18 U/L (ref 0–37)
Alkaline Phosphatase: 77 U/L (ref 39–117)
BILIRUBIN TOTAL: 0.3 mg/dL (ref 0.3–1.2)
BUN: 10 mg/dL (ref 6–23)
CHLORIDE: 98 meq/L (ref 96–112)
CO2: 29 mEq/L (ref 19–32)
Calcium: 9.2 mg/dL (ref 8.4–10.5)
Creatinine, Ser: 1.33 mg/dL — ABNORMAL HIGH (ref 0.50–1.10)
GFR calc Af Amer: 46 mL/min — ABNORMAL LOW (ref >90)
GFR calc non Af Amer: 40 mL/min — ABNORMAL LOW (ref >90)
Glucose, Bld: 96 mg/dL (ref 70–99)
POTASSIUM: 5.1 meq/L (ref 3.7–5.3)
Sodium: 132 mEq/L — ABNORMAL LOW (ref 137–147)
Total Protein: 6.5 g/dL (ref 6.0–8.3)

## 2013-09-21 MED ORDER — DRONABINOL 2.5 MG PO CAPS
2.5000 mg | ORAL_CAPSULE | Freq: Two times a day (BID) | ORAL | Status: DC
  Administered 2013-09-21 – 2013-09-23 (×4): 2.5 mg via ORAL
  Filled 2013-09-21 (×4): qty 1

## 2013-09-21 MED ORDER — METOCLOPRAMIDE HCL 5 MG/ML IJ SOLN
5.0000 mg | Freq: Three times a day (TID) | INTRAMUSCULAR | Status: DC | PRN
  Administered 2013-09-21 – 2013-09-22 (×2): 5 mg via INTRAVENOUS
  Filled 2013-09-21 (×2): qty 2

## 2013-09-21 NOTE — Progress Notes (Signed)
[facility] PROGRESS NOTE  Jeanette Morris GGY:[id number] DOB: 07-13-1946 DOA: 09/17/2013 PCP: [name], NP  Assessment/Plan:  Abdominal/Thoracic pain - Oncology on board. Current discomfort consistent with local chest wall involvement, radiology oncology and plans for 3 treatments of short course radiation therapy directed at area of discomfort. First treatment today 09/20/13. Other treatments scheduled for Monday and Tuesday. - More than likely will plan on discharging next am 09/22/13 unless otherwise indicated by oncologist or oncology/radiology   Leukocytosis - Resolved  Numbness to Right 2-4 digit - probable carpel tunnel syndrome - Pt encouraged to wear splint - PT/OT following - pt to follow up with PCP upon discharge for further evaluation and workup   Nausea - Currently patient on zofran but patient has prolonged QT interval as such will discontinue zofran - place on Marinol and Reglan at this juncture and reassess. - Should patient's mental status be affected by the marinol will plan on discontinuing.  Code Status: full Family Communication: None at bedside Disposition Plan: Per oncology    Consultants:  Oncology  Oncology radiology  Antibiotics:  None  HPI/Subjective: Patient still complaining of some nausea. No acute issues reported overnight.  Objective: Filed Vitals:   09/21/13 0414  BP: 141/60  Pulse: 72  Temp: 98.6 F (37 C)  Resp: 16    Intake/Output Summary (Last 24 hours) at 09/21/13 1154 Last data filed at 09/20/13 1855  Gross per 24 hour  Intake    360 ml  Output    400 ml  Net    -40 ml   Filed Weights   09/17/13 2319  Weight: 98.022 kg (216 lb 1.6 oz)    Exam:   General:  Pt in NAD, Alert, awake, and oreinted  Cardiovascular: RRR, no MRG  Respiratory: CTA BL, no wheezes  Abdomen: soft, ND  Musculoskeletal: no cyanosis, edema, or clubbing.   Data Reviewed: Basic Metabolic Panel:  Recent Labs Lab  09/17/13 1645 09/18/13 0333 09/19/13 0615 09/20/13 0415 09/21/13 0444  NA 133* 135* 132* 133* 132*  K 5.2 5.0 5.0 5.0 5.1  CL 94* 96 96 98 98  CO2 30 31 29 29 29   GLUCOSE 132* 79 107* 90 96  BUN 12 12 11 10 10   CREATININE 1.37* 1.36* 1.35* 1.36* 1.33*  CALCIUM 9.0 8.9 8.5 8.5 9.2   Liver Function Tests:  Recent Labs Lab 09/17/13 1645 09/18/13 0333 09/19/13 0615 09/20/13 0415 09/21/13 0444  AST 27 23 19 18 18   ALT 11 10 8 7 6   ALKPHOS 100 96 85 76 77  BILITOT 0.2* 0.3 0.3 0.3 0.3  PROT 7.0 6.8 6.4 6.0 6.5  ALBUMIN 2.7* 2.7* 2.4* 2.2* 2.4*    Recent Labs Lab 09/17/13 1645  LIPASE 22   No results found for this basename: AMMONIA,  in the last 168 hours CBC:  Recent Labs Lab 09/17/13 1645 09/18/13 0333 09/19/13 0615 09/20/13 0415 09/21/13 0444  WBC 11.8* 13.7* 10.9* 9.1 8.1  NEUTROABS  --  11.2* 8.5* 6.9 6.1  HGB 10.9* 10.4* 9.6* 9.3* 9.6*  HCT 33.4* 32.8* 31.3* 29.3* 31.4*  MCV 89.8 91.4 91.5 91.6 92.4  PLT 234 230 179 176 163   Cardiac Enzymes: No results found for this basename: CKTOTAL, CKMB, CKMBINDEX, TROPONINI,  in the last 168 hours BNP (last 3 results)  Recent Labs  08/03/13 2100  PROBNP 612.9*   CBG: No results found for this basename: GLUCAP,  in the last 168 hours  Recent Results (from the past  240 hour(s))  URINE CULTURE     Status: None   Collection Time    09/17/13 10:24 PM      Result Value Range Status   Specimen Description URINE, RANDOM   Final   Special Requests NONE   Final   Culture  Setup Time     Final   Value: 09/18/2013 01:45     Performed at [facility]     Final   Value: NO GROWTH     Performed at [facility]   Culture     Final   Value: NO GROWTH     Performed at [facility]   Report Status 09/19/2013 FINAL   Final     Studies: No results found.  Scheduled Meds: . calcium-vitamin D  1 tablet Oral q morning - 10a  . carvedilol  18.75 mg Oral BID WC  .  cyanocobalamin  500 mcg Oral Q breakfast  . ferrous sulfate  325 mg Oral Q breakfast  . gabapentin  300 mg Oral QHS  . heparin  5,000 Units Subcutaneous Q8H  . OxyCODONE  20 mg Oral Q12H  . polyethylene glycol  17 g Oral Daily  . potassium chloride SA  20 mEq Oral Daily  . protein supplement  1 scoop Oral BID WC  . sodium chloride  3 mL Intravenous Q12H   Continuous Infusions:   Active Problems:    Time spent: > 35 minutes   Velvet Bathe  [facility] Pager [phone]. If 7PM-7AM, please contact night-coverage at [email or web address], password [facility] 09/21/2013, 11:54 AM  LOS: 4 days

## 2013-09-21 NOTE — Evaluation (Signed)
Physical Therapy Evaluation Patient Details Name: Jeanette Morris MRN: [id number] DOB: 06-Sep-1945 Today's Date: 09/21/2013 Time: 7494-4967 PT Time Calculation (min): 81 min  PT Assessment / Plan / Recommendation History of Present Illness  This is a 68 y.o. 68 year old female with prior history of metastatic breast cancer s/p mastectomy, on chemotherapy, chemotherapy induced cardiomyopathy, chronic pleural effusions presents with severe lower abdominal pain.   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  She wants to return home and continue outpatient PT at [facility] for lymphedema management.    PT Assessment  Patient needs continued PT services    Follow Up Recommendations  Outpatient PT (pt had been coming to outpatient cancer rehab [phone])    Does the patient have the potential to tolerate intense rehabilitation      Barriers to Discharge        Equipment Recommendations  None recommended by PT    Recommendations for Other Services     Frequency Min 3X/week    Precautions / Restrictions Precautions Precautions: Fall Precaution Comments: pt limited by pain in ribs and she has limited reserve...she needs to sit down quickly Restrictions Weight Bearing Restrictions: No   Pertinent Vitals/Pain Pain in left side.  Pt received pain meds prior to mobilty today      Mobility  Bed Mobility Overal bed mobility: Needs Assistance Bed Mobility: Supine to Sit Supine to sit: Min assist Transfers Overall transfer level: Needs assistance Equipment used: Rolling walker (2 wheeled) Transfers: Sit to/from [facility] Sit to Stand: Min guard Stand pivot transfers: Min guard General transfer comment: Pt transferred to BCS to void and BM . total assist for hygiene, Ambulation/Gait Ambulation/Gait assistance: Min guard Ambulation Distance (Feet): 50 Feet  (x2) Assistive device: Rolling walker (2 wheeled) Gait Pattern/deviations: Decreased step length - right;Decreased step length - left;Trunk flexed;Shuffle Gait velocity interpretation: Below normal speed for age/gender General Gait Details: pt leans forward with swollen arms resting on RW, Needs another person for O2 tank and IV pole and to pull up chair    Exercises General Exercises - Lower Extremity Ankle Circles/Pumps: AROM;Both;10 reps;Supine Quad Sets: AROM;Both;5 reps;Supine Gluteal Sets: AROM;Both;10 reps;Supine Short Arc Quad: AROM;Both;10 reps;Supine Hip ABduction/ADduction: AROM;Both;10 reps;Supine   PT Diagnosis: Difficulty walking;Generalized weakness;Acute pain  PT Problem List: Decreased strength;Decreased activity tolerance;Pain PT Treatment Interventions: Gait training;Functional mobility training;Stair training;Therapeutic activities;Therapeutic exercise;Balance training     PT Goals(Current goals can be found in the care plan section) Acute Rehab PT Goals Patient Stated Goal: to go home , pt does not want a wheelchair at home PT Goal Formulation: With patient/family Time For Goal Achievement: 09/28/13 Potential to Achieve Goals: Good  Visit Information  Last PT Received On: 09/21/13 History of Present Illness: This is a 68 y.o. 68 year old female with prior history of metastatic breast cancer s/p mastectomy, on chemotherapy, chemotherapy induced cardiomyopathy, chronic pleural effusions presents with severe lower abdominal pain.        Prior [place] expects to be discharged to:: Private residence Living Arrangements: Spouse/significant other Available Help at Discharge: Family Type of Home: House Home Access: Stairs to enter Technical brewer of Steps: 2 Entrance Stairs-Rails: None Home Layout: One level Home Equipment: Walker - 2 wheels Additional Comments: Oxygen 24/7 2 liters.  Pt had been coming to outpatient cancer  rehab for compression wrapping for right arm Prior Function Level of  Independence: Needs assistance Communication Communication: No difficulties Dominant Hand: Right    Cognition  Cognition Arousal/Alertness: Awake/alert Behavior During Therapy: WFL for tasks assessed/performed Overall Cognitive Status: Within Functional Limits for tasks assessed    Extremity/Trunk Assessment Upper Extremity Assessment Upper Extremity Assessment: RUE deficits/detail;LUE deficits/detail RUE Deficits / Details: stage 3 lymphedema in arm with pain near collar bone with pain and numbness in hand LUE Deficits / Details: stage 3 lymphededma in left arm, but no pain Lower Extremity Assessment Lower Extremity Assessment: Overall WFL for tasks assessed;Generalized weakness Cervical / Trunk Assessment Cervical / Trunk Assessment: Other exceptions Cervical / Trunk Exceptions: obese   Balance Balance Overall balance assessment: Modified Independent  End of Session PT - End of Session Equipment Utilized During Treatment: Oxygen Activity Tolerance: Patient limited by pain Patient left: in chair;with nursing/sitter in room Nurse Communication: Mobility status  GP Functional Assessment Tool Used: clincial judgement Functional Limitation: Mobility: Walking and moving around Mobility: Walking and Moving Around Current Status ([id number]): At least 40 percent but less than 60 percent impaired, limited or restricted Mobility: Walking and Moving Around Goal Status [phone]): At least 20 percent but less than 40 percent impaired, limited or restricted   Jeanette Morris Levo 09/21/2013, 11:03 AM

## 2013-09-22 DIAGNOSIS — G5600 Carpal tunnel syndrome, unspecified upper limb: Secondary | ICD-10-CM

## 2013-09-22 DIAGNOSIS — R110 Nausea: Secondary | ICD-10-CM | POA: Diagnosis present

## 2013-09-22 DIAGNOSIS — D72829 Elevated white blood cell count, unspecified: Secondary | ICD-10-CM | POA: Diagnosis present

## 2013-09-22 DIAGNOSIS — J189 Pneumonia, unspecified organism: Secondary | ICD-10-CM

## 2013-09-22 DIAGNOSIS — R109 Unspecified abdominal pain: Secondary | ICD-10-CM | POA: Diagnosis not present

## 2013-09-22 LAB — CBC WITH DIFFERENTIAL/PLATELET
Basophils Absolute: 0 10*3/uL (ref 0.0–0.1)
Basophils Relative: 0 % (ref 0–1)
Eosinophils Absolute: 0 10*3/uL (ref 0.0–0.7)
Eosinophils Relative: 1 % (ref 0–5)
HEMATOCRIT: 28.7 % — AB (ref 36.0–46.0)
HEMOGLOBIN: 8.9 g/dL — AB (ref 12.0–15.0)
LYMPHS ABS: 0.9 10*3/uL (ref 0.7–4.0)
LYMPHS PCT: 14 % (ref 12–46)
MCH: 28.3 pg (ref 26.0–34.0)
MCHC: 31 g/dL (ref 30.0–36.0)
MCV: 91.4 fL (ref 78.0–100.0)
MONOS PCT: 13 % — AB (ref 3–12)
Monocytes Absolute: 0.8 10*3/uL (ref 0.1–1.0)
NEUTROS ABS: 4.8 10*3/uL (ref 1.7–7.7)
Neutrophils Relative %: 73 % (ref 43–77)
Platelets: 158 10*3/uL (ref 150–400)
RBC: 3.14 MIL/uL — ABNORMAL LOW (ref 3.87–5.11)
RDW: 15.1 % (ref 11.5–15.5)
WBC: 6.7 10*3/uL (ref 4.0–10.5)

## 2013-09-22 LAB — COMPREHENSIVE METABOLIC PANEL
ALT: 6 U/L (ref 0–35)
AST: 16 U/L (ref 0–37)
Albumin: 2.2 g/dL — ABNORMAL LOW (ref 3.5–5.2)
Alkaline Phosphatase: 66 U/L (ref 39–117)
BUN: 10 mg/dL (ref 6–23)
CALCIUM: 8 mg/dL — AB (ref 8.4–10.5)
CO2: 28 mEq/L (ref 19–32)
Chloride: 102 mEq/L (ref 96–112)
Creatinine, Ser: 1.18 mg/dL — ABNORMAL HIGH (ref 0.50–1.10)
GFR calc non Af Amer: 46 mL/min — ABNORMAL LOW (ref >90)
GFR, EST AFRICAN AMERICAN: 53 mL/min — AB (ref >90)
Glucose, Bld: 101 mg/dL — ABNORMAL HIGH (ref 70–99)
Potassium: 4.8 mEq/L (ref 3.7–5.3)
SODIUM: 137 meq/L (ref 137–147)
TOTAL PROTEIN: 5.9 g/dL — AB (ref 6.0–8.3)
Total Bilirubin: 0.3 mg/dL (ref 0.3–1.2)

## 2013-09-22 MED ORDER — METOCLOPRAMIDE HCL 5 MG PO TABS
5.0000 mg | ORAL_TABLET | Freq: Three times a day (TID) | ORAL | Status: DC
  Administered 2013-09-22 – 2013-09-23 (×4): 5 mg via ORAL
  Filled 2013-09-22 (×6): qty 1

## 2013-09-22 NOTE — Evaluation (Signed)
Occupational Therapy Evaluation Patient Details Name: Jeanette Morris MRN: [id number] DOB: March 29, 1946 Today's Date: 09/22/2013 Time: 8657-8469 OT Time Calculation (min): 38 min  OT Assessment / Plan / Recommendation History of present illness This is a 68 y.o. year old female with prior history of metastatic breast cancer s/p mastectomy, on chemotherapy, chemotherapy induced cardiomyopathy, chronic pleural effusions presents with severe lower abdominal pain.    Clinical Impression   Patient evaluated by Occupational Therapy with no further acute OT needs identified. All education has been completed and the patient has no further questions. See below for any follow-up Occupational Therapy or equipment needs. OT to sign off. Thank you for referral. Pt is near baseline for ADLS and transfers per pt and spouse. Pt's spouse requesting contact information for department and provided main acute rehab number.      OT Assessment  Patient does not need any further OT services    Follow Up Recommendations  No OT follow up    Barriers to Discharge      Equipment Recommendations  None recommended by OT    Recommendations for Other Services    Frequency       Precautions / Restrictions Precautions Precautions: Fall Restrictions Weight Bearing Restrictions: No   Pertinent Vitals/Pain 9 out 10 pain see vitals RN providing IV pain medication    ADL  Eating/Feeding: Set up Where Assessed - Eating/Feeding: Chair Grooming: Wash/dry hands;Wash/dry face;Set up Where Assessed - Grooming: Supported sitting Lower Body Bathing: +1 Total assistance Where Assessed - Lower Body Bathing: Supported sit to stand ADL Comments: Pt recieveing bath from CNA on arrival with Total (A) for LB. pt with 9 out 10 pain and recieving IV medications. pt educated on energy conservation and provided hand out. pt and spouse discussing in detail daily routine and changes that they can make using techniques to help maximize  patients participation while allowing her to be comfortable in a seated position. pt and spouse educated about need for change of position every 2 hours to decr risk for wound and how to down grade these adl task to bed level for days that are difficult for patient to participate. Educated on bed level for days treatment provided and nausea limiting patient. Pt and spouse report functional mobility and transfers are close to baseline prior to return to hospital.      OT Goals(Current goals can be found in the care plan section) Acute Rehab OT Goals Patient Stated Goal: to return home with family  Visit Information  Last OT Received On: 09/22/13 Assistance Needed: +1 History of Present Illness: This is a 68 y.o. year old female with prior history of metastatic breast cancer s/p mastectomy, on chemotherapy, chemotherapy induced cardiomyopathy, chronic pleural effusions presents with severe lower abdominal pain.        Prior [place] expects to be discharged to:: Private residence Living Arrangements: Spouse/significant other Available Help at Discharge: Family Type of Home: House Home Access: Stairs to enter Technical brewer of Steps: 2 Entrance Stairs-Rails: None Home Layout: One level Home Equipment: Walker - 2 wheels Additional Comments: Pt sponge baths in bathroom on toilet by spouse or daughter. Pt does not use the shower due to current IV lines for CA treatment.Oxygen 24/7 2 liters.  Pt had been coming to outpatient cancer rehab for compression wrapping for right arm Prior Function Level of Independence: Needs assistance ADL's / Homemaking Assistance Needed: pt requires (A) with sponge bath and toileting at baseline.  Communication Communication: No difficulties Dominant Hand: Right         Vision/Perception Vision - History Baseline Vision: Wears glasses all the time Patient Visual Report: No change from baseline   Cognition   Cognition Arousal/Alertness: Awake/alert Behavior During Therapy: WFL for tasks assessed/performed Overall Cognitive Status: Within Functional Limits for tasks assessed    Extremity/Trunk Assessment Upper Extremity Assessment RUE Deficits / Details: numbness and currently with wrist cock up splint present in room. Pt not wearing splint due to wrist band. Tech printing new wrist band and placed on left UE to allow pt to maximize wear time in Rt hand splint. pt is able to don brace independently Lower Extremity Assessment Lower Extremity Assessment: Defer to PT evaluation Cervical / Trunk Assessment Cervical / Trunk Exceptions: obese     Mobility Transfers Overall transfer level: Needs assistance Transfers: Sit to/from Stand Sit to Stand: Min guard     Exercise     Balance     End of Session OT - End of Session Activity Tolerance: Patient limited by pain Patient left: in chair;with call bell/phone within reach;with family/visitor present Nurse Communication: Precautions  GO Functional Assessment Tool Used: clinical judgement Functional Limitation: Self care Self Care Current Status (V4098): At least 40 percent but less than 60 percent impaired, limited or restricted Self Care Goal Status (J1914): At least 40 percent but less than 60 percent impaired, limited or restricted Self Care Discharge Status [phone]): At least 40 percent but less than 60 percent impaired, limited or restricted   Jeanette Morris 09/22/2013, 11:12 AM Pager: [phone]

## 2013-09-22 NOTE — Progress Notes (Signed)
[facility] PROGRESS NOTE  Jeanette Morris YTK:[id number] DOB: 1946/03/17 DOA: 09/17/2013 PCP: [name], NP  Assessment/Plan:  Abdominal/Thoracic pain - Oncology on board. Current discomfort consistent with local chest wall involvement, radiology oncology and plans for 3 treatments of short course radiation therapy directed at area of discomfort. First treatment 09/20/13. Other treatments scheduled for Monday and Tuesday. - Pt still complaining of pain and nausea. Recently started on different antiemetic. Pt reports improvement with change in medication. Will try transitioning to oral pain/nausea medication. Related to persistent nausea and decreased PO intake related to nausea, we will keep pt in the hospital until better controlled and able to tolerate PO medication.   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 - Should patient's mental status be affected by the marinol will plan on discontinuing.  Code Status: full Family Communication: Updated husband on plan of care. Family concerned with being able to control pt's pain and nausea at home without IV medications. Disposition Plan: Per oncology and ability to control pain/nausea on PO medication   Consultants:  Oncology  Oncology radiology  Antibiotics:  None  HPI/Subjective: Patient still complaining of some nausea and intermittent abdominal/thoracic pain. No acute issues reported overnight.  Objective: Filed Vitals:   09/22/13 1425  BP: 132/61  Pulse: 74  Temp: 98 F (36.7 C)  Resp: 18    Intake/Output Summary (Last 24 hours) at 09/22/13 1430 Last data filed at 09/22/13 1300  Gross per 24 hour  Intake     420 ml  Output    200 ml  Net    220 ml   Filed Weights   09/17/13 2319  Weight: 98.022 kg (216 lb 1.6 oz)    Exam:   General:  Pt in NAD, Alert, awake, and oreinted  Cardiovascular: RRR, no MRG  Respiratory: CTA BL, no wheezes  Abdomen: soft, ND. Tenderness to LUQ/thoracic wall  Musculoskeletal: no cyanosis, edema, or clubbing.   Data Reviewed: Basic Metabolic Panel:  Recent Labs Lab 09/18/13 0333 09/19/13 0615 09/20/13 0415 09/21/13 0444 09/22/13 1200  NA 135* 132* 133* 132* 137  K 5.0 5.0 5.0 5.1 4.8  CL 96 96 98 98 102  CO2 31 29 29 29 28   GLUCOSE 79 107* 90 96 101*  BUN 12 11 10 10 10   CREATININE 1.36* 1.35* 1.36* 1.33* 1.18*  CALCIUM 8.9 8.5 8.5 9.2 8.0*   Liver Function Tests:  Recent Labs Lab 09/18/13 0333 09/19/13 0615 09/20/13 0415 09/21/13 0444 09/22/13 1200  AST 23 19 18 18 16   ALT 10 8 7 6 6   ALKPHOS 96 85 76 77 66  BILITOT 0.3 0.3 0.3 0.3 0.3  PROT 6.8 6.4 6.0 6.5 5.9*  ALBUMIN 2.7* 2.4* 2.2* 2.4* 2.2*    Recent Labs Lab 09/17/13 1645  LIPASE 22   No results found for this basename: AMMONIA,  in the last 168 hours CBC:  Recent Labs Lab 09/18/13 0333 09/19/13 0615 09/20/13 0415 09/21/13 0444 09/22/13 1200  WBC 13.7* 10.9* 9.1 8.1 6.7  NEUTROABS 11.2* 8.5* 6.9 6.1 4.8  HGB 10.4* 9.6* 9.3* 9.6* 8.9*  HCT 32.8* 31.3* 29.3* 31.4* 28.7*  MCV 91.4 91.5 91.6 92.4 91.4  PLT  230 179 176 163 158   Cardiac Enzymes: No results found for this basename: CKTOTAL, CKMB, CKMBINDEX, TROPONINI,  in the last 168 hours BNP (last 3 results)  Recent Labs  08/03/13 2100  PROBNP 612.9*   CBG: No results found for this basename: GLUCAP,  in the last 168 hours  Recent Results (from the past 240 hour(s))  URINE CULTURE     Status: None   Collection Time    09/17/13 10:24 PM      Result Value Range Status   Specimen Description URINE, RANDOM   Final   Special Requests NONE   Final   Culture  Setup Time     Final   Value: 09/18/2013  01:45     Performed at [facility]     Final   Value: NO GROWTH     Performed at [facility]   Culture     Final   Value: NO GROWTH     Performed at [facility]   Report Status 09/19/2013 FINAL   Final     Studies: No results found.  Scheduled Meds: . calcium-vitamin D  1 tablet Oral q morning - 10a  . carvedilol  18.75 mg Oral BID WC  . cyanocobalamin  500 mcg Oral Q breakfast  . dronabinol  2.5 mg Oral BID AC  . ferrous sulfate  325 mg Oral Q breakfast  . gabapentin  300 mg Oral QHS  . heparin  5,000 Units Subcutaneous Q8H  . metoCLOPramide  5 mg Oral TID AC  . OxyCODONE  20 mg Oral Q12H  . polyethylene glycol  17 g Oral Daily  . potassium chloride SA  20 mEq Oral Daily  . protein supplement  1 scoop Oral BID WC  . sodium chloride  3 mL Intravenous Q12H   Continuous Infusions:       Time spent: > 35 minutes   Larwance Sachs  [facility] Pager [phone]. If 7PM-7AM, please contact night-coverage at [email or web address], password [facility] 09/22/2013, 2:30 PM  LOS: 5 days

## 2013-09-23 ENCOUNTER — Ambulatory Visit
Admit: 2013-09-23 | Discharge: 2013-09-23 | Disposition: A | Discharge status: Home / Self Care | Payer: PRIVATE HEALTH INSURANCE | Attending: Radiation Oncology | Admitting: Radiation Oncology

## 2013-09-23 DIAGNOSIS — G5600 Carpal tunnel syndrome, unspecified upper limb: Secondary | ICD-10-CM

## 2013-09-23 DIAGNOSIS — R109 Unspecified abdominal pain: Secondary | ICD-10-CM | POA: Diagnosis not present

## 2013-09-23 LAB — CBC WITH DIFFERENTIAL/PLATELET
BASOS ABS: 0 10*3/uL (ref 0.0–0.1)
Basophils Relative: 0 % (ref 0–1)
EOS ABS: 0 10*3/uL (ref 0.0–0.7)
Eosinophils Relative: 1 % (ref 0–5)
HCT: 29.4 % — ABNORMAL LOW (ref 36.0–46.0)
Hemoglobin: 9 g/dL — ABNORMAL LOW (ref 12.0–15.0)
LYMPHS ABS: 0.9 10*3/uL (ref 0.7–4.0)
Lymphocytes Relative: 15 % (ref 12–46)
MCH: 28.2 pg (ref 26.0–34.0)
MCHC: 30.6 g/dL (ref 30.0–36.0)
MCV: 92.2 fL (ref 78.0–100.0)
Monocytes Absolute: 0.9 10*3/uL (ref 0.1–1.0)
Monocytes Relative: 14 % — ABNORMAL HIGH (ref 3–12)
NEUTROS PCT: 71 % (ref 43–77)
Neutro Abs: 4.5 10*3/uL (ref 1.7–7.7)
Platelets: 157 10*3/uL (ref 150–400)
RBC: 3.19 MIL/uL — AB (ref 3.87–5.11)
RDW: 15.1 % (ref 11.5–15.5)
WBC: 6.3 10*3/uL (ref 4.0–10.5)

## 2013-09-23 LAB — COMPREHENSIVE METABOLIC PANEL
ALT: 7 U/L (ref 0–35)
AST: 18 U/L (ref 0–37)
Albumin: 2.3 g/dL — ABNORMAL LOW (ref 3.5–5.2)
Alkaline Phosphatase: 66 U/L (ref 39–117)
BILIRUBIN TOTAL: 0.3 mg/dL (ref 0.3–1.2)
BUN: 11 mg/dL (ref 6–23)
CHLORIDE: 97 meq/L (ref 96–112)
CO2: 32 mEq/L (ref 19–32)
CREATININE: 1.35 mg/dL — AB (ref 0.50–1.10)
Calcium: 9.2 mg/dL (ref 8.4–10.5)
GFR calc Af Amer: 45 mL/min — ABNORMAL LOW (ref >90)
GFR calc non Af Amer: 39 mL/min — ABNORMAL LOW (ref >90)
GLUCOSE: 105 mg/dL — AB (ref 70–99)
Potassium: 5.2 mEq/L (ref 3.7–5.3)
Sodium: 134 mEq/L — ABNORMAL LOW (ref 137–147)
Total Protein: 6.2 g/dL (ref 6.0–8.3)

## 2013-09-23 MED ORDER — METOCLOPRAMIDE HCL 5 MG PO TABS: 5.0000 mg | ORAL_TABLET | Freq: Three times a day (TID) | ORAL | Status: DC

## 2013-09-23 MED ORDER — RESOURCE INSTANT PROTEIN PO PWD PACKET: 1.0000 | Freq: Two times a day (BID) | ORAL | Status: DC

## 2013-09-23 MED ORDER — DRONABINOL 2.5 MG PO CAPS: 2.5000 mg | ORAL_CAPSULE | Freq: Two times a day (BID) | ORAL | Status: DC

## 2013-09-23 MED ORDER — OXYCODONE HCL 5 MG PO TABS: 5.0000 mg | ORAL_TABLET | ORAL | Status: DC | PRN

## 2013-09-23 MED ORDER — HEPARIN SOD (PORK) LOCK FLUSH 100 UNIT/ML IV SOLN
500.0000 [IU] | INTRAVENOUS | Status: AC | PRN
  Administered 2013-09-23: 500 [IU]

## 2013-09-23 NOTE — Progress Notes (Signed)
Spoke with pt and husband at bedside for discharge plans and needs. Both would like to continue with OP for PT on [facility]. There are no other needs at present time.

## 2013-09-23 NOTE — Discharge Summary (Signed)
Physician Discharge Summary  Jeanette Morris DEY:[id number] DOB: February 18, 1946 DOA: 09/17/2013  PCP: [name], NP  Admit date: 09/17/2013 Discharge date: 09/23/2013  Time spent: > 35 minutes  Recommendations for Outpatient Follow-up:  1. Please be sure to followup with hemoglobin levels 2. Also followup with serum creatinine levels 3. Continue pain control 4. Patient to continue x-ray therapy  Discharge Diagnoses:  Active Problems:   Breast cancer of upper-outer quadrant of right female breast   Breast cancer metastasized to multiple sites   Abdominal pain   Carpal tunnel syndrome   Leukocytosis, unspecified   Nausea alone   Discharge Condition: Stable  Diet recommendation: Low sodium heart healthy  Filed Weights   09/17/13 2319  Weight: 98.022 kg (216 lb 1.6 oz)    History of present illness:  Patient is a 68 year old African American female with history of metastatic breast cancer status post mastectomy, on chemotherapy, chemotherapy induced cardiomyopathy, and stable right-sided hydronephrosis. Patient presenting complaining of abdominal pain. Found to have lytic bone lesions in lower thoracic spine and bilateral ribs.  Hospital Course:  Abdominal/Thoracic pain   - Oncology on board. Current discomfort consistent with local chest wall involvement, radiology oncology and plans for 3 treatments of short course radiation therapy directed at area of discomfort. First treatment 09/20/13. Other treatments scheduled for Monday1/26/15 and Tuesday1/27/15.  - Pt still complaining of pain and nausea. Recently started on different antiemetic. Pt reports improvement with change in medication.  Leukocytosis  - Resolved   Numbness to Right 2-4 digit  - probable carpel tunnel syndrome  - Pt encouraged to wear splint  - PT/OT following  - pt to follow up with PCP upon discharge for further evaluation and workup   Nausea  - Patient has prolonged QT interval as such zofran  discontinued 1/24  - placed on Marinol and Reglan PO  Procedures:  Radiation therapy per Radiation oncologist  Consultations:  Radiation oncology  Oncology  Discharge Exam: Filed Vitals:   09/23/13 1212  BP: 148/65  Pulse:   Temp:   Resp:     General: Pt in NAD, Alert and awake Cardiovascular: RRR, no MRG Respiratory: CTA BL, no wheezes  Discharge Instructions  Discharge Orders   Future Appointments Provider Department Dept Phone   09/24/2013 12:00 PM [facility] [phone]   09/25/2013 10:45 AM [facility] [phone]   09/25/2013 11:15 AM [name] Fredderick [facility] [phone]   09/25/2013 12:30 PM [facility] [phone]   09/26/2013 11:15 AM [facility] [phone]   10/02/2013 2:30 PM [facility] [phone]   10/02/2013 3:00 PM Chauncey Cruel, MD [facility] [phone]   Future Orders Complete By Expires   Call MD for:  difficulty breathing, headache or visual disturbances  As directed    Call MD for:  persistant nausea and vomiting  As directed    Call MD for:  severe uncontrolled pain  As directed    Call MD for:  temperature >100.4  As directed    Diet - low sodium heart healthy  As directed    Driving Restrictions  As directed    Comments:     May not drive while on opiods.   Increase activity slowly  As directed  Medication List    STOP taking these medications       ondansetron 8 MG tablet  Commonly known as:  ZOFRAN     PRESCRIPTION MEDICATION      TAKE these medications       Calcium Carbonate-Vitamin D 600-400 MG-UNIT per tablet  Take 1 tablet by mouth every morning.     carvedilol 6.25 MG tablet  Commonly known  as:  COREG  Take 1 tablet (6.25 mg total) by mouth 2 (two) times daily with a meal. She takes with a 12.5 mg tablet to equal her dose of 18.75 mg.     cyanocobalamin 500 MCG tablet  Take 500 mcg by mouth daily with breakfast.     dronabinol 2.5 MG capsule  Commonly known as:  MARINOL  Take 1 capsule (2.5 mg total) by mouth 2 (two) times daily before lunch and supper.     ferrous sulfate 325 (65 FE) MG tablet  Take 1 tablet (325 mg total) by mouth daily with breakfast.     gabapentin 300 MG capsule  Commonly known as:  NEURONTIN  Take 300 mg by mouth at bedtime.     metoCLOPramide 5 MG tablet  Commonly known as:  REGLAN  Take 1 tablet (5 mg total) by mouth 3 (three) times daily before meals.     OxyCODONE 20 mg T12a 12 hr tablet  Commonly known as:  OXYCONTIN  Take 1 tablet (20 mg total) by mouth every 12 (twelve) hours.     oxyCODONE 5 MG immediate release tablet  Commonly known as:  Oxy IR/ROXICODONE  Take 1 tablet (5 mg total) by mouth every 4 (four) hours as needed for severe pain.     polyethylene glycol packet  Commonly known as:  MIRALAX / GLYCOLAX  Take 17 g by mouth daily.     potassium chloride SA 20 MEQ tablet  Commonly known as:  K-DUR,KLOR-CON  Take 20 mEq by mouth daily.       No Known Allergies    The results of significant diagnostics from this hospitalization (including imaging, microbiology, ancillary and laboratory) are listed below for reference.    Significant Diagnostic Studies: Dg Chest 2 View  09/17/2013   CLINICAL DATA:  Left upper quadrant pain.  EXAM: CHEST  2 VIEW  COMPARISON:  08/09/2013  FINDINGS: Port-A-Cath tip in the upper SVC and in a stable horizontal orientation. Evidence for small bilateral pleural effusions. Subtle densities in the right upper lung could represent asymmetric airspace disease. Heart size is upper limits of normal but unchanged. Surgical clips in the axilla bilaterally. 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  IMPRESSION: Small bilateral pleural effusions.  Subtle lung densities, particularly on the right side. Findings could represent mild edema.   Electronically Signed   By: Markus Daft M.D.   On: 09/17/2013 17:16   Ct Chest W Contrast  09/13/2013   CLINICAL DATA:  History of right breast cancer diagnosed in 2011 and left breast cancer diagnosed in 2014. Chemotherapy in progress. Bilateral arm lymphedema and shortness of breath.  EXAM: CT CHEST WITH CONTRAST  TECHNIQUE: Multidetector CT imaging of the chest was performed during intravenous contrast administration.  CONTRAST:  59mL OMNIPAQUE IOHEXOL 300 MG/ML  SOLN  COMPARISON:  NM PET IMAGE RESTAG (PS) SKULL BASE TO THIGH dated 09/13/2013; CT ANGIO CHEST W/CM &/OR WO/CM dated 07/22/2013  FINDINGS: Left subclavian Port-A-Cath tip is unchanged in the upper SVC. Multinodular goiter with asymmetric  enlargement and substernal extension of the left lobe is stable.  There is residual supraclavicular soft tissue fullness bilaterally, hypermetabolic on today's PET-CT and consistent with residual metastatic adenopathy. This has improved from the prior studies, although is not well defined on CT. On the left, this measures approximately 3.5 x 2.4 cm on image 6. A 2.0 cm soft tissue nodule adjacent to the left clavicle on image number 1 is hypermetabolic on PET. The subcutaneous nodularity throughout the left chest wall and axilla has improved. There is less asymmetric enlargement of the left pectoralis muscle.  There are no enlarged mediastinal, hilar or internal mammary lymph nodes. Small bilateral pleural effusions are present. There is no pericardial effusion. The heart is enlarged. There are diffuse coronary artery calcifications.  Mild dependent atelectasis is present in both lower lobes. There are no suspicious pulmonary nodules.  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 There is no adrenal mass.  Multiple lytic metastases involving the  lower thoracic spine have progressed. These are most prominent within the left aspects of T9 through T12 vertebral bodies. There is destruction of the left eleventh rib at the costovertebral junction. There is adjacent left paraspinal tumor extending into the neural foramina. No gross intraspinal extension is identified. Multiple rib metastases are demonstrated with a probable pathologic fracture of the right seventh rib posteriorly. There is also comminuted and displaced fracture of the medial right clavicle.  IMPRESSION: 1. Interval improvement in left chest wall tumor and bilateral supraclavicular nodal metastases. 2. No mediastinal adenopathy or pulmonary metastases identified. 3. Small bilateral pleural effusions without nodularity or hypermetabolism on today's PET-CT. 4. Multifocal osseous metastatic disease with progression, especially in the lower thoracic spine and left paraspinal soft tissues. Rib and right clavicle fractures are noted.   Electronically Signed   By: Camie Patience M.D.   On: 09/13/2013 14:50   Ct Abdomen Pelvis W Contrast  09/17/2013   CLINICAL DATA:  Left-sided abdominal pain. Vomiting. Breast carcinoma.  EXAM: CT ABDOMEN AND PELVIS WITH CONTRAST  TECHNIQUE: Multidetector CT imaging of the abdomen and pelvis was performed using the standard protocol following bolus administration of intravenous contrast.  CONTRAST:  189mL OMNIPAQUE IOHEXOL 300 MG/ML  SOLN  COMPARISON:  PET-CT on 09/13/2013  FINDINGS: Images through the lung bases show no significant change in small to moderate bilateral pleural effusions.  Mild diffuse biliary ductal dilatation is again seen as well as porcelain gallbladder. No evidence of acute cholecystitis. No liver masses are identified. The pancreas, spleen, adrenal glands, and left kidney are normal in appearance. Severe right hydronephrosis is again demonstrated, however no obstructing calculus or mass is visualized, and this is stable since previous study. A  congenital right UPJ obstruction cannot be excluded.  No abdominal or pelvic soft tissue masses or lymphadenopathy identified. No evidence of acute inflammatory process or abnormal fluid collections. Colonic diverticulosis noted, however there is no evidence of diverticulitis. Tiny amount of free fluid noted in pelvic cul-de-sac, which is nonspecific. Mild diffuse body wall edema noted.  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 No other suspicious bone lesions identified.  IMPRESSION: No acute findings within the abdomen or pelvis.  Stable severe right hydronephrosis. Etiology not apparent by CT. Congenital UPJ obstruction cannot be excluded.  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   Electronically Signed   By: Earle Gell M.D.   On: 09/17/2013 18:57  Nm Pet Image Restag (ps) Skull Base To Thigh  09/13/2013   CLINICAL DATA:  Subsequent treatment strategy for bilateral breast cancer.  EXAM: NUCLEAR MEDICINE PET SKULL BASE TO THIGH  FASTING BLOOD GLUCOSE:  Value: 112mg /dl  TECHNIQUE: 16.4 mCi F-18 FDG was injected intravenously. CT data was obtained and used for attenuation correction and anatomic localization only. (This was not acquired as a diagnostic CT examination.) Additional exam technical data entered on technologist worksheet.  COMPARISON:  NM PET IMAGE RESTAG (PS) SKULL BASE TO THIGH dated 07/02/2013  FINDINGS: NECK  There is persistent but improved bilateral supraclavicular hypermetabolic adenopathy.These are ill-defined and somewhat difficult to measure, although measure approximately 2.2 cm on the right (image 45, SUV max 8.4) and 1.9 cm on the left (image 49, SUV max 7.0). There is no upper cervical adenopathy. Asymmetric goiter appears stable without abnormal metabolic activity.  CHEST  There has been interval improvement in the multifocal activity within  the left chest wall and axilla. There is a residual 2.5 cm component adjacent to the left clavicle on image 41. There is no mediastinal or hilar hypermetabolic nodal activity. Diffuse muscular activity is again noted within the upper right arm, similar to the prior study. Substernal extension of goiter, coronary artery disease and bilateral pleural effusions are again noted. No residual abnormal activity is seen within the right pleural space.  ABDOMEN/PELVIS  There is no hypermetabolic activity within the liver, adrenal glands, spleen or pancreas. There is no significant residual central mesenteric hypermetabolic activity. The left external iliac node noted previously has resolved. There is new dilatation of the right renal pelvis and proximal ureter without high-grade ureteral obstruction. Porcelain gallbladder again noted.  SKELETON  Multifocal osseous metastases are again noted. The upper thoracic and rib activity noted previously has improved. However, there is worsening activity within the lower thoracic spine. This corresponds with enlarging lytic lesions involving the left aspects of the T9, T10 and T11 vertebral bodies and posterior elements (CT images 82- 100). There is left paraspinal soft tissue tumor as well as possible epidural tumor, difficult to assess given streak artifact from the patient's arms. Additional lesions are present within the ribs and bony pelvis.  IMPRESSION: 1. There has been improvement in the left chest wall activity and multiple nodal metastasis involving the left axilla and supraclavicular stations bilaterally. 2. Persistent bilateral pleural effusions without associated abnormal metabolic activity. 3. Fluctuating osseous metastases with improvement in the upper thoracic spine and upper right chest wall. There is worsening disease in the lower left thoracic spine with the potential for epidural tumor in this region. Again, spinal assessment is limited by body habitus. If the  patient has focal neurologic symptoms, further evaluation with MRI may be helpful.   Electronically Signed   By: Camie Patience M.D.   On: 09/13/2013 13:58    Microbiology: Recent Results (from the past 240 hour(s))  URINE CULTURE     Status: None   Collection Time    09/17/13 10:24 PM      Result Value Range Status   Specimen Description URINE, RANDOM   Final   Special Requests NONE   Final   Culture  Setup Time     Final   Value: 09/18/2013 01:45     Performed at [facility]     Final   Value: NO GROWTH     Performed at [facility]   Culture     Final   Value:  NO GROWTH     Performed at [facility]   Report Status 09/19/2013 FINAL   Final     Labs: Basic Metabolic Panel:  Recent Labs Lab 09/19/13 0615 09/20/13 0415 09/21/13 0444 09/22/13 1200 09/23/13 0440  NA 132* 133* 132* 137 134*  K 5.0 5.0 5.1 4.8 5.2  CL 96 98 98 102 97  CO2 29 29 29 28  32  GLUCOSE 107* 90 96 101* 105*  BUN 11 10 10 10 11   CREATININE 1.35* 1.36* 1.33* 1.18* 1.35*  CALCIUM 8.5 8.5 9.2 8.0* 9.2   Liver Function Tests:  Recent Labs Lab 09/19/13 0615 09/20/13 0415 09/21/13 0444 09/22/13 1200 09/23/13 0440  AST 19 18 18 16 18   ALT 8 7 6 6 7   ALKPHOS 85 76 77 66 66  BILITOT 0.3 0.3 0.3 0.3 0.3  PROT 6.4 6.0 6.5 5.9* 6.2  ALBUMIN 2.4* 2.2* 2.4* 2.2* 2.3*    Recent Labs Lab 09/17/13 1645  LIPASE 22   No results found for this basename: AMMONIA,  in the last 168 hours CBC:  Recent Labs Lab 09/19/13 0615 09/20/13 0415 09/21/13 0444 09/22/13 1200 09/23/13 0440  WBC 10.9* 9.1 8.1 6.7 6.3  NEUTROABS 8.5* 6.9 6.1 4.8 4.5  HGB 9.6* 9.3* 9.6* 8.9* 9.0*  HCT 31.3* 29.3* 31.4* 28.7* 29.4*  MCV 91.5 91.6 92.4 91.4 92.2  PLT 179 176 163 158 157   Cardiac Enzymes: No results found for this basename: CKTOTAL, CKMB, CKMBINDEX, TROPONINI,  in the last 168 hours BNP: BNP (last 3 results)  Recent Labs  08/03/13 2100  PROBNP 612.9*    CBG: No results found for this basename: GLUCAP,  in the last 168 hours     Signed:  Velvet Bathe  [facility] 09/23/2013, 2:10 PM

## 2013-09-23 NOTE — Progress Notes (Signed)
Physical Therapy Treatment Patient Details Name: Jeanette Morris MRN: [id number] DOB: Oct 23, 1945 Today's Date: 09/23/2013 Time: 1345-1400 PT Time Calculation (min): 15 min  PT Assessment / Plan / Recommendation  History of Present Illness This is a 68 y.o. year old female with prior history of metastatic breast cancer s/p mastectomy, on chemotherapy, chemotherapy induced cardiomyopathy, chronic pleural effusions presents with severe lower abdominal pain.    PT Comments   *Activity tolerance limited by LLQ pain and nausea. Pt only able to tolerate pivot to [facility] then recliner. **  Follow Up Recommendations  Outpatient PT (pt had been coming to outpatient cancer rehab [phone])     Does the patient have the potential to tolerate intense rehabilitation     Barriers to Discharge        Equipment Recommendations  None recommended by PT    Recommendations for Other Services    Frequency Min 3X/week   Progress towards PT Goals Progress towards PT goals: Not progressing toward goals - comment (limited by pain today)  Plan Current plan remains appropriate    Precautions / Restrictions Precautions Precautions: Fall Precaution Comments: pt limited by pain in ribs and she has limited reserve...she needs to sit down quickly Restrictions Weight Bearing Restrictions: No   Pertinent Vitals/Pain *unrated severe*left lower abdomen pain RN notified*    Mobility  Bed Mobility Bed Mobility: Supine to Sit Supine to sit: Min assist General bed mobility comments: assist to raise trunk Transfers Overall transfer level: Needs assistance Equipment used: Rolling walker (2 wheeled) Transfers: Stand Pivot Transfers;Sit to/from Stand Sit to Stand: Min guard Stand pivot transfers: Min guard General transfer comment: bed to [facility], then to recliner with RW, min/guard for safety, assist to manage O2 and IV    Exercises     PT Diagnosis:    PT Problem List:   PT Treatment Interventions:     PT Goals  (current goals can now be found in the care plan section) Acute Rehab PT Goals Patient Stated Goal: to go home , pt does not want a wheelchair at home PT Goal Formulation: With patient/family Time For Goal Achievement: 09/28/13 Potential to Achieve Goals: Good  Visit Information  Last PT Received On: 09/23/13 Assistance Needed: +1 History of Present Illness: This is a 68 y.o. year old female with prior history of metastatic breast cancer s/p mastectomy, on chemotherapy, chemotherapy induced cardiomyopathy, chronic pleural effusions presents with severe lower abdominal pain.     Subjective Data  Patient Stated Goal: to go home , pt does not want a wheelchair at home   Cognition  Cognition Arousal/Alertness: Awake/alert Behavior During Therapy: WFL for tasks assessed/performed Overall Cognitive Status: Within Functional Limits for tasks assessed    Balance     End of Session PT - End of Session Equipment Utilized During Treatment: Oxygen Activity Tolerance: Patient limited by pain Patient left: in chair;with nursing/sitter in room Nurse Communication: Mobility status   GP     Blondell Reveal Kistler 09/23/2013, 2:04 PM [phone]

## 2013-09-24 ENCOUNTER — Ambulatory Visit
Admit: 2013-09-24 | Discharge: 2013-09-24 | Disposition: A | Discharge status: Home / Self Care | Payer: PRIVATE HEALTH INSURANCE | Attending: Radiation Oncology | Admitting: Radiation Oncology

## 2013-09-24 ENCOUNTER — Ambulatory Visit
Admission: RE | Admit: 2013-09-24 | Discharge: 2013-09-24 | Disposition: A | Discharge status: Home / Self Care | Payer: PRIVATE HEALTH INSURANCE | Source: Ambulatory Visit | Attending: Radiation Oncology | Admitting: Radiation Oncology

## 2013-09-24 ENCOUNTER — Telehealth: Payer: Self-pay | Admitting: *Deleted

## 2013-09-24 VITALS — BP 184/80 | HR 75 | Temp 98.7°F | Ht 64.0 in

## 2013-09-24 DIAGNOSIS — Z79899 Other long term (current) drug therapy: Secondary | ICD-10-CM | POA: Diagnosis not present

## 2013-09-24 DIAGNOSIS — C7951 Secondary malignant neoplasm of bone: Secondary | ICD-10-CM | POA: Diagnosis not present

## 2013-09-24 DIAGNOSIS — Z510 Encounter for antineoplastic radiation therapy: Secondary | ICD-10-CM | POA: Diagnosis present

## 2013-09-24 DIAGNOSIS — C773 Secondary and unspecified malignant neoplasm of axilla and upper limb lymph nodes: Secondary | ICD-10-CM | POA: Diagnosis not present

## 2013-09-24 DIAGNOSIS — C771 Secondary and unspecified malignant neoplasm of intrathoracic lymph nodes: Secondary | ICD-10-CM | POA: Diagnosis not present

## 2013-09-24 DIAGNOSIS — C50919 Malignant neoplasm of unspecified site of unspecified female breast: Secondary | ICD-10-CM | POA: Diagnosis not present

## 2013-09-24 MED ORDER — HYDROMORPHONE HCL PF 1 MG/ML IJ SOLN
1.0000 mg | Freq: Once | INTRAMUSCULAR | Status: AC
  Administered 2013-09-24: 1 mg via INTRAMUSCULAR
  Filled 2013-09-24: qty 1

## 2013-09-24 MED ORDER — LORAZEPAM 0.5 MG PO TABS
0.5000 mg | ORAL_TABLET | Freq: Once | ORAL | Status: AC
  Administered 2013-09-24: 0.5 mg via SUBLINGUAL
  Filled 2013-09-24: qty 1

## 2013-09-24 MED ORDER — HYDROMORPHONE HCL PF 4 MG/ML IJ SOLN
1.0000 mg | Freq: Once | INTRAMUSCULAR | Status: DC
  Filled 2013-09-24: qty 1

## 2013-09-24 NOTE — Telephone Encounter (Signed)
CALLED PATIENT TO INFORM OF NUTRITION APPT. ON 09-27-13 AT 11:15 AM , SPOKE WITH PATIENT AND SHE IS AWARE OF THIS APPT.

## 2013-09-24 NOTE — Progress Notes (Addendum)
Jeanette Morris has had 3 fractions to her left chest.  Jeanette Morris report pain 10/10 in her left chest.  Jeanette Morris is taking oxcodone 20 mg q 12 hours and oxy 15 5 mg q 4 hours for breakthrough.  Jeanette Morris denies skin irritation.  Jeanette Morris is fatigued.  Jeanette Morris has lost 10 lbs since 09/04/13.  Jeanette Morris would like to have a dietician appointment set up.  Patient is having nausea.  Jeanette Morris can't remember what Jeanette Morris is taking for nausea at home.

## 2013-09-24 NOTE — Progress Notes (Signed)
Patient is rating her pain at an 8/10.  States that she is feeling better.

## 2013-09-24 NOTE — Progress Notes (Signed)
Patient given 0.5 mg Ativan SL and 1 mg dilaudid IM in her right hip per Dr. Sondra Come.

## 2013-09-24 NOTE — Progress Notes (Signed)
[place]     Rexene Edison, M.D. [place], [place] [address]               Blair Promise, M.D., Ph.D. Phone: [phone]      [place] A. Tammi Klippel, M.D. Fax: [phone]      Jodelle Gross, M.D., Ph.D.         Thea Silversmith, M.D.         Wyvonnia Lora, M.D Weekly Treatment Management Note  Name: Jeanette Morris     MRN: [id number]        CSN: [id number] Date: 09/24/2013      DOB: June 21, 1946  CC: [name], NP         [name]    Status: Outpatient  Diagnosis: Metastatic breast cancer  Current Dose: 12 gy  Current Fraction: 3  Planned Dose: 12 Gy  Narrative: Avionna Bower was seen today for weekly treatment management. The chart was checked and electron set-up  was reviewed. She is tolerating treatments well. Patient was discharged from the hospital yesterday. She has had some pain today as well as nausea.  She has not noticed any improvement in the pain at the treatment site. She was given IM pain shot as well as some Ativan for her nausea while in the clinic.  Review of patient's allergies indicates no known allergies. Current Outpatient Prescriptions  Medication Sig Dispense Refill  . Calcium Carbonate-Vitamin D 600-400 MG-UNIT per tablet Take 1 tablet by mouth every morning.       . carvedilol (COREG) 6.25 MG tablet Take 1 tablet (6.25 mg total) by mouth 2 (two) times daily with a meal. She takes with a 12.5 mg tablet to equal her dose of 18.75 mg.  60 tablet  6  . cyanocobalamin 500 MCG tablet Take 500 mcg by mouth daily with breakfast.       . dronabinol (MARINOL) 2.5 MG capsule Take 1 capsule (2.5 mg total) by mouth 2 (two) times daily before lunch and supper.  15 capsule  0  . ferrous sulfate 325 (65 FE) MG tablet Take 1 tablet (325 mg total) by mouth daily with breakfast.  30 tablet  1  . gabapentin (NEURONTIN) 300 MG capsule Take 300 mg by mouth at bedtime.       . ondansetron (ZOFRAN) 8 MG tablet        . oxyCODONE (OXY IR/ROXICODONE) 5 MG immediate release tablet Take 1 tablet (5 mg total) by mouth every 4 (four) hours as needed for severe pain.  30 tablet  0  . OxyCODONE (OXYCONTIN) 20 mg T12A 12 hr tablet Take 1 tablet (20 mg total) by mouth every 12 (twelve) hours.  60 tablet  0  . polyethylene glycol (MIRALAX / GLYCOLAX) packet Take 17 g by mouth daily.  14 each  0  . metoCLOPramide (REGLAN) 5 MG tablet Take 1 tablet (5 mg total) by mouth 3 (three) times daily before meals.  20 tablet  0  . potassium chloride SA (K-DUR,KLOR-CON) 20 MEQ tablet Take 20 mEq by mouth daily.       . prochlorperazine (COMPAZINE) 10 MG tablet       . protein supplement (RESOURCE BENEPROTEIN) 6 g POWD Take 1 scoop (6 g total) by mouth 2 (two) times daily with a meal.  15 Can  0   No current facility-administered medications for this encounter.   Labs:  Lab Results  Component  Value Date   WBC 6.3 09/23/2013   HGB 9.0* 09/23/2013   HCT 29.4* 09/23/2013   MCV 92.2 09/23/2013   PLT 157 09/23/2013   Lab Results  Component Value Date   CREATININE 1.35* 09/23/2013   BUN 11 09/23/2013   NA 134* 09/23/2013   K 5.2 09/23/2013   CL 97 09/23/2013   CO2 32 09/23/2013   Lab Results  Component Value Date   ALT 7 09/23/2013   AST 18 09/23/2013   BILITOT 0.3 09/23/2013    Physical Examination:  Filed Vitals:   09/24/13 1239  BP: 184/80  Pulse: 75  Temp: 98.7 F (37.1 C)    Wt Readings from Last 3 Encounters:  09/17/13 216 lb 1.6 oz (98.022 kg)  09/12/13 218 lb 12.8 oz (99.247 kg)  09/04/13 226 lb (102.513 kg)     Lungs - Normal respiratory effort, chest expands symmetrically. Lungs are clear to auscultation, no crackles or wheezes.  Heart has regular rhythm and rate  Abdomen is soft and non tender with normal bowel sounds  Assessment:  Patient tolerating treatments well  Plan: Routine followup in one month

## 2013-09-24 NOTE — Progress Notes (Signed)
Patient rating her pain at a 7.  Denies having nausea.  Patient escorted out of the clinic in a wheelchair by her husband.

## 2013-09-25 ENCOUNTER — Ambulatory Visit (HOSPITAL_BASED_OUTPATIENT_CLINIC_OR_DEPARTMENT_OTHER): Payer: PRIVATE HEALTH INSURANCE | Admitting: Physician Assistant

## 2013-09-25 ENCOUNTER — Ambulatory Visit (HOSPITAL_BASED_OUTPATIENT_CLINIC_OR_DEPARTMENT_OTHER): Payer: PRIVATE HEALTH INSURANCE

## 2013-09-25 ENCOUNTER — Other Ambulatory Visit: Payer: Self-pay | Admitting: Oncology

## 2013-09-25 ENCOUNTER — Telehealth: Payer: Self-pay | Admitting: *Deleted

## 2013-09-25 ENCOUNTER — Encounter: Payer: Self-pay | Admitting: Physician Assistant

## 2013-09-25 ENCOUNTER — Other Ambulatory Visit (HOSPITAL_BASED_OUTPATIENT_CLINIC_OR_DEPARTMENT_OTHER): Payer: PRIVATE HEALTH INSURANCE

## 2013-09-25 VITALS — BP 189/83 | HR 84 | Temp 98.7°F | Resp 18 | Ht 64.0 in | Wt 224.0 lb

## 2013-09-25 DIAGNOSIS — C7951 Secondary malignant neoplasm of bone: Secondary | ICD-10-CM

## 2013-09-25 DIAGNOSIS — R209 Unspecified disturbances of skin sensation: Secondary | ICD-10-CM

## 2013-09-25 DIAGNOSIS — C50412 Malignant neoplasm of upper-outer quadrant of left female breast: Secondary | ICD-10-CM

## 2013-09-25 DIAGNOSIS — C50411 Malignant neoplasm of upper-outer quadrant of right female breast: Secondary | ICD-10-CM

## 2013-09-25 DIAGNOSIS — R202 Paresthesia of skin: Secondary | ICD-10-CM

## 2013-09-25 DIAGNOSIS — C50919 Malignant neoplasm of unspecified site of unspecified female breast: Secondary | ICD-10-CM

## 2013-09-25 DIAGNOSIS — Z452 Encounter for adjustment and management of vascular access device: Secondary | ICD-10-CM

## 2013-09-25 DIAGNOSIS — J90 Pleural effusion, not elsewhere classified: Secondary | ICD-10-CM

## 2013-09-25 DIAGNOSIS — Z5111 Encounter for antineoplastic chemotherapy: Secondary | ICD-10-CM

## 2013-09-25 DIAGNOSIS — I890 Lymphedema, not elsewhere classified: Secondary | ICD-10-CM

## 2013-09-25 DIAGNOSIS — C50619 Malignant neoplasm of axillary tail of unspecified female breast: Secondary | ICD-10-CM

## 2013-09-25 DIAGNOSIS — J910 Malignant pleural effusion: Secondary | ICD-10-CM

## 2013-09-25 DIAGNOSIS — R200 Anesthesia of skin: Secondary | ICD-10-CM

## 2013-09-25 DIAGNOSIS — C773 Secondary and unspecified malignant neoplasm of axilla and upper limb lymph nodes: Secondary | ICD-10-CM

## 2013-09-25 DIAGNOSIS — R109 Unspecified abdominal pain: Secondary | ICD-10-CM

## 2013-09-25 DIAGNOSIS — Z95828 Presence of other vascular implants and grafts: Secondary | ICD-10-CM

## 2013-09-25 LAB — CBC WITH DIFFERENTIAL/PLATELET
BASO%: 0.6 % (ref 0.0–2.0)
BASOS ABS: 0 10*3/uL (ref 0.0–0.1)
EOS%: 0.2 % (ref 0.0–7.0)
Eosinophils Absolute: 0 10*3/uL (ref 0.0–0.5)
HCT: 31.5 % — ABNORMAL LOW (ref 34.8–46.6)
HGB: 10.2 g/dL — ABNORMAL LOW (ref 11.6–15.9)
LYMPH%: 12.5 % — ABNORMAL LOW (ref 14.0–49.7)
MCH: 29 pg (ref 25.1–34.0)
MCHC: 32.3 g/dL (ref 31.5–36.0)
MCV: 89.9 fL (ref 79.5–101.0)
MONO#: 1 10*3/uL — AB (ref 0.1–0.9)
MONO%: 12 % (ref 0.0–14.0)
NEUT%: 74.7 % (ref 38.4–76.8)
NEUTROS ABS: 5.9 10*3/uL (ref 1.5–6.5)
PLATELETS: 178 10*3/uL (ref 145–400)
RBC: 3.51 10*6/uL — AB (ref 3.70–5.45)
RDW: 16.1 % — AB (ref 11.2–14.5)
WBC: 8 10*3/uL (ref 3.9–10.3)
lymph#: 1 10*3/uL (ref 0.9–3.3)

## 2013-09-25 LAB — COMPREHENSIVE METABOLIC PANEL (CC13)
ALBUMIN: 2.6 g/dL — AB (ref 3.5–5.0)
ALT: 12 U/L (ref 0–55)
ANION GAP: 6 meq/L (ref 3–11)
AST: 25 U/L (ref 5–34)
Alkaline Phosphatase: 68 U/L (ref 40–150)
BUN: 13.1 mg/dL (ref 7.0–26.0)
CO2: 32 meq/L — AB (ref 22–29)
Calcium: 9.6 mg/dL (ref 8.4–10.4)
Chloride: 96 mEq/L — ABNORMAL LOW (ref 98–109)
Creatinine: 1.1 mg/dL (ref 0.6–1.1)
GLUCOSE: 135 mg/dL (ref 70–140)
POTASSIUM: 5 meq/L (ref 3.5–5.1)
SODIUM: 134 meq/L — AB (ref 136–145)
Total Bilirubin: 0.5 mg/dL (ref 0.20–1.20)
Total Protein: 6.7 g/dL (ref 6.4–8.3)

## 2013-09-25 MED ORDER — OXYCODONE HCL ER 40 MG PO T12A: 40.0000 mg | EXTENDED_RELEASE_TABLET | Freq: Two times a day (BID) | ORAL | Status: DC

## 2013-09-25 MED ORDER — ONDANSETRON 8 MG/50ML IVPB (CHCC)
8.0000 mg | Freq: Once | INTRAVENOUS | Status: AC
  Administered 2013-09-25: 8 mg via INTRAVENOUS

## 2013-09-25 MED ORDER — ONDANSETRON 8 MG/NS 50 ML IVPB
INTRAVENOUS | Status: AC
  Filled 2013-09-25: qty 8

## 2013-09-25 MED ORDER — SODIUM CHLORIDE 0.9 % IJ SOLN
10.0000 mL | INTRAMUSCULAR | Status: DC | PRN
  Administered 2013-09-25: 10 mL
  Filled 2013-09-25: qty 10

## 2013-09-25 MED ORDER — SODIUM CHLORIDE 0.9 % IV SOLN
Freq: Once | INTRAVENOUS | Status: AC
  Administered 2013-09-25: 13:00:00 via INTRAVENOUS

## 2013-09-25 MED ORDER — DEXAMETHASONE SODIUM PHOSPHATE 10 MG/ML IJ SOLN
10.0000 mg | Freq: Once | INTRAMUSCULAR | Status: AC
  Administered 2013-09-25: 10 mg via INTRAVENOUS

## 2013-09-25 MED ORDER — HEPARIN SOD (PORK) LOCK FLUSH 100 UNIT/ML IV SOLN
500.0000 [IU] | Freq: Once | INTRAVENOUS | Status: AC | PRN
  Administered 2013-09-25: 500 [IU]
  Filled 2013-09-25: qty 5

## 2013-09-25 MED ORDER — DEXAMETHASONE SODIUM PHOSPHATE 10 MG/ML IJ SOLN
INTRAMUSCULAR | Status: AC
  Filled 2013-09-25: qty 1

## 2013-09-25 MED ORDER — PACLITAXEL PROTEIN-BOUND CHEMO INJECTION 100 MG
100.0000 mg/m2 | Freq: Once | INTRAVENOUS | Status: AC
  Administered 2013-09-25: 225 mg via INTRAVENOUS
  Filled 2013-09-25: qty 45

## 2013-09-25 MED ORDER — SODIUM CHLORIDE 0.9 % IJ SOLN
10.0000 mL | INTRAMUSCULAR | Status: DC | PRN
  Administered 2013-09-25: 10 mL via INTRAVENOUS
  Filled 2013-09-25: qty 10

## 2013-09-25 MED ORDER — HEPARIN SOD (PORK) LOCK FLUSH 100 UNIT/ML IV SOLN
500.0000 [IU] | Freq: Once | INTRAVENOUS | Status: AC
  Administered 2013-09-25: 500 [IU] via INTRAVENOUS
  Filled 2013-09-25: qty 5

## 2013-09-25 NOTE — Telephone Encounter (Signed)
appts made and printed. Pt is aware that x will be added. i emailed MW to add the tx's...td

## 2013-09-25 NOTE — Patient Instructions (Signed)

## 2013-09-25 NOTE — Patient Instructions (Signed)

## 2013-09-25 NOTE — Telephone Encounter (Signed)
Per staff message and POF I have scheduled appts.  JMW  

## 2013-09-25 NOTE — Progress Notes (Signed)
ID: [name]   DOB: 1946/05/30  MR#: [id number]  PVX#:[id number]  PCP: Jeanette Pillow, NP GYN:  [facility] OTHER MD: Jeanette Morris, Jeanette Morris, Jeanette Morris  CHIEF COMPLAINT:  Metastatic Breast Cancer   HISTORY OF PRESENT ILLNESS: The patient undergoes annual screening mammography.  Her mammograms previously had fullness.  In August 2011 she presented with some firmness of her Morris breast. She also noted some skin thickening around her nipple and some tenderness.  She was subsequently seen for this on 05/13/2010 and was referred for bilateral mammogram and Morris breast ultrasound.  There was concern that this may be mastitis.  There were diffuse skin thickening, tissue edema, enlarged lymph node in Morris axilla, some subareolar distortion secondary to the previous surgery.  She was given a course of doxycycline for 10 days and asked to return. A follow-up ultrasound of the breast showed persistent abnormalities.  She was referred for biopsy.  She underwent biopsy of the Morris axilla on 05/27/2010 and it showed metastatic carcinoma consistent with breast primary.  This was ER and PR positive, HER-2 was positive with a ratio of 3.09.  The ER ratio is 34%, PR was 64%, proliferative index 60%.  She subsequently had a MRI scan of both breasts on 06/03/2010 and this showed a area of minimal enhancement in the Morris breast measuring 10.0 x 6.0 x 3.0 cm.  Enlarged lymph nodes were seen in the Morris axilla.  Largest node measuring 3.0 x 1.0 x 7.0 cm.  Intramammary lymph nodes were also seen in the outer left breast, the largest measuring 2.8 x 2.5 cm.  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   The patient's subsequent history is as detailed below.  INTERVAL HISTORY: Jeanette Morris returns today accompanied by her husband Denyse Morris for followup of her metastatic breast cancer. Interval history is notable for the fact that Jeanette Morris was  hospitalized between 09/17/2013 and 09/23/2013, primarily for left-sided abdominal pain. This was found to be consistent with her local chest wall involvement, and she subsequently received a short course of radiation directed to this area. She received 3 treatments, the last on 09/24/2013.   Unfortunately, Jeanette Morris continues to have pain in the left side, and ranks it a 10 on a scale of 1-10 today. She's taking her OxyContin, 20 mg twice daily, and is also taking oxycodone 5 mg every 4 hours for breakthrough pain. This combination, however, is not giving her relief. She is managing her constipation with 2 tablets a stool softeners daily and is managing to have regular bowel movements despite the pain medication.  Basically Jeanette Morris tells me that if we can get the pain in her left side under control, she "would feel fine."  Jeanette Morris has been receiving Abraxane, given on days one and 8 of each 21 day cycle with Neulasta on day 2 for granulocyte support. Treatment was held last week due to her hospitalization. She is here today, ready to resume Abraxane and due for day 1 cycle 3.   She tolerates the chemotherapy well. She does have significant numbness in the Morris hand, but this is thought to be associated with her disease process and lymphedema, possibly carpal tunnel syndrome, but was present prior to initiating Abraxane. She's had no significant numbness or tingling in the left hand or in the feet. Unfortunately, she is Morris-handed.  Jeanette Morris was previously being seen regularly at the lymphedema clinic, but that schedule  changed with her recent hospitalization. She will be referred back for further evaluation.   REVIEW OF SYSTEMS: Jeanette Morris denies any recent fevers or chills. She's noticed no skin changes and has had no abnormal bleeding. Her energy level is low. Her appetite is low, and she attributes this to her pain. She denies any cough, increased shortness of breath, chest pain, or palpitations. She  denies any abnormal headaches or dizziness. She has intermittent swelling in the feet and ankles which is stable.  A detailed review of systems is otherwise stable and noncontributory.   PAST MEDICAL HISTORY: Past Medical History  Diagnosis Date  . Hypertension   . Hearing loss     Morris ear  . Hyperlipidemia   . Arthritis   . Dysrhythmia     A fib with IV chemo treatments  . Shortness of breath     with exertion  . 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 Breast cancer     (Rt) breast ca dx 9/11  . 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 Neuropathy   . History of radiation therapy 01/18/11-03/07/11    5940 cGy to Morris chest wall, axilla and supraclavicular region  . Lymphedema of arm 08/28/2013  . Tuberculosis     15 yrs ago- treated for 1 yeat    PAST SURGICAL HISTORY: Past Surgical History  Procedure Laterality Date  . Tubal ligation    . Portacath placement Left   . Abdominal hysterectomy    . Lung biopsy    . Lesion excision      L anterior thigh  . Breast surgery  12/01/10    ER+,PR+,Ki 67 64%,Her2 -; Morris breast lumpectomy  . Chest tube insertion  08/08/2012    Procedure: INSERTION PLEURAL DRAINAGE CATHETER;  Surgeon: Jeanette Pollack, MD;  Location: [facility];  Service: Thoracic;  Laterality: Morris;  . Removal of pleural drainage catheter Morris 10/10/2012    Procedure: REMOVAL OF PLEURAL DRAINAGE CATHETER;  Surgeon: Jeanette Pollack, MD;  Location: [facility];  Service: Thoracic;  Laterality: Morris;  . Mastectomy, radical Left 05/08/2013    Dr Jeanette Morris  . Mastectomy modified radical Left 05/08/2013    Procedure: MASTECTOMY MODIFIED RADICAL;  Surgeon: Jeanette Roof, MD;  Location: [facility];  Service: General;  Laterality: Left;    FAMILY HISTORY History reviewed. No pertinent family history. The patient's father died in an automobile accident at age 64. The patient's mother died at age 46 with Alzheimer's disease. The patient had 8 brothers and 4 sisters. There is no history of breast or  ovarian cancer in the family.  GYNECOLOGIC HISTORY: Menarche age 31, first live birth age 65. She is GX P7. She underwent menopause "more than 20 years ago". She never took hormone replacement.  SOCIAL HISTORY:  (Updated 09/25/2013) She has been mostly a housewife. Her husband Denyse Morris used to be a cook at [employer]. He completed radiation therapy for prostate cancer under Arloa Koh April of 2014. The patient's children are from an earlier marriage, and include Evlyn Kanner, [place], [place], [place], Danielle [place], [place], and [employer]. The patient has 15 grandchildren and 1 great-grandchild. She attends [employer]  ADVANCED DIRECTIVES: Not in place  HEALTH MAINTENANCE: (Updated 09/25/2013) History  Substance Use Topics  . Smoking status: Former Smoker    Quit date: 06/30/1973  . Smokeless tobacco: Never Used  . Alcohol Use: No     Colonoscopy:  Not on file  PAP: Not on file  Bone density:  August 2012, Normal  Lipid panel: Not on file   No Known Allergies    Objective: Middle-aged [place] American woman examined in a wheelchair. She is wearing a nasal cannula and carrying a portable oxygen tank.  She appears to be uncomfortable but is in no acute distress    Filed Vitals:   09/25/13 1102  BP: 189/83  Pulse: 84  Temp: 98.7 F (37.1 C)  Resp: 18    Body mass index is 38.43 kg/(m^2).    ECOG FS: 2 Filed Weights   09/25/13 1102  Weight: 224 lb (101.606 kg)   Physical Exam: HEENT:  Sclerae anicteric.  Oropharynx clear and moist. No ulcerations noted. Neck is supple.  NODES:  No cervical or supraclavicular lymphadenopathy palpated.  BREAST EXAM:  Deferred. LUNGS:  Clear to auscultation bilaterally although breath sounds are slightly diminished bilaterally in the bases, more so on the Morris than the left..  No wheezes or rhonchi HEART:  Regular rate and rhythm.  ABDOMEN:  Soft, obese.  Tender to very gentle palpation  of the left upper quadrant. Otherwise non-tender to palpation  Positive bowel sounds.  MSK:  No focal spinal tenderness to palpation.  Limited range of motion bilaterally in the upper extremities secondary to swelling and pain. EXTREMITIES: Significant lymphedema noted in the upper extremities, greater on the Morris than the left. Nonpitting pedal edema bilaterally, equal bilaterally.   SKIN:  Benign with no visible rashes or skin lesions. No nail dyscrasia. No excessive ecchymoses or petechiae. NEURO:  Nonfocal. Well oriented.  Appropriate affect.      LAB RESULTS: Lab Results  Component Value Date   WBC 8.0 09/25/2013   NEUTROABS 5.9 09/25/2013   HGB 10.2* 09/25/2013   HCT 31.5* 09/25/2013   MCV 89.9 09/25/2013   PLT 178 09/25/2013      Chemistry      Component Value Date/Time   NA 134* 09/25/2013 1034   NA 134* 09/23/2013 0440   K 5.0 09/25/2013 1034   K 5.2 09/23/2013 0440   CL 97 09/23/2013 0440   CL 106 02/14/2013 1048   CO2 32* 09/25/2013 1034   CO2 32 09/23/2013 0440   BUN 13.1 09/25/2013 1034   BUN 11 09/23/2013 0440   CREATININE 1.1 09/25/2013 1034   CREATININE 1.35* 09/23/2013 0440      Component Value Date/Time   CALCIUM 9.6 09/25/2013 1034   CALCIUM 9.2 09/23/2013 0440   ALKPHOS 68 09/25/2013 1034   ALKPHOS 66 09/23/2013 0440   AST 25 09/25/2013 1034   AST 18 09/23/2013 0440   ALT 12 09/25/2013 1034   ALT 7 09/23/2013 0440   BILITOT 0.50 09/25/2013 1034   BILITOT 0.3 09/23/2013 0440       Lab Results  Component Value Date   LABCA2 95* 08/14/2012    STUDIES: Dg Chest 2 View 09/17/2013   CLINICAL DATA:  Left upper quadrant pain.  EXAM: CHEST  2 VIEW  COMPARISON:  08/09/2013  FINDINGS: Port-A-Cath tip in the upper SVC and in a stable horizontal orientation. Evidence for small bilateral pleural effusions. Subtle densities in the Morris upper lung could represent asymmetric airspace disease. Heart size is upper limits of normal but unchanged. Surgical clips in the axilla  bilaterally. 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  IMPRESSION: Small bilateral pleural effusions.  Subtle lung densities, particularly on the Morris side. Findings could represent mild edema.  Electronically Signed   By: Markus Daft M.D.   On: 09/17/2013 17:16   Ct Chest W Contrast 09/13/2013   CLINICAL DATA:  History of Morris breast cancer diagnosed in 2011 and left breast cancer diagnosed in 2014. Chemotherapy in progress. Bilateral arm lymphedema and shortness of breath.  EXAM: CT CHEST WITH CONTRAST  TECHNIQUE: Multidetector CT imaging of the chest was performed during intravenous contrast administration.  CONTRAST:  40m OMNIPAQUE IOHEXOL 300 MG/ML  SOLN  COMPARISON:  NM PET IMAGE RESTAG (PS) SKULL BASE TO THIGH dated 09/13/2013; CT ANGIO CHEST W/CM &/OR WO/CM dated 07/22/2013  FINDINGS: Left subclavian Port-A-Cath tip is unchanged in the upper SVC. 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  There is residual supraclavicular soft tissue fullness bilaterally, hypermetabolic on today's PET-CT and consistent with residual metastatic adenopathy. This has improved from the prior studies, although is not well defined on CT. On the left, this measures approximately 3.5 x 2.4 cm on image 6. A 2.0 cm soft tissue nodule adjacent to the left clavicle on image number 1 is hypermetabolic on PET. The subcutaneous nodularity throughout the left chest wall and axilla has improved. There is less asymmetric enlargement of the left pectoralis muscle.  There are no enlarged mediastinal, hilar or internal mammary lymph nodes. Small bilateral pleural effusions are present. There is no pericardial effusion. The heart is enlarged. There are diffuse coronary artery calcifications.  Mild dependent atelectasis is present in both lower lobes. There are no suspicious pulmonary nodules.  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 There is  no adrenal mass.  Multiple lytic metastases involving the lower thoracic spine have progressed. These are most prominent within the left aspects of T9 through T12 vertebral bodies. There is destruction of the left eleventh rib at the costovertebral junction. There is adjacent left paraspinal tumor extending into the neural foramina. No gross intraspinal extension is identified. Multiple rib metastases are demonstrated with a probable pathologic fracture of the Morris seventh rib posteriorly. There is also comminuted and displaced fracture of the medial Morris clavicle.  IMPRESSION: 1. Interval improvement in left chest wall tumor and bilateral supraclavicular nodal metastases. 2. No mediastinal adenopathy or pulmonary metastases identified. 3. Small bilateral pleural effusions without nodularity or hypermetabolism on today's PET-CT. 4. Multifocal osseous metastatic disease with progression, especially in the lower thoracic spine and left paraspinal soft tissues. Rib and Morris clavicle fractures are noted.   Electronically Signed   By: BCamie PatienceM.D.   On: 09/13/2013 14:50   Ct Abdomen Pelvis W Contrast 09/17/2013   CLINICAL DATA:  Left-sided abdominal pain. Vomiting. Breast carcinoma.  EXAM: CT ABDOMEN AND PELVIS WITH CONTRAST  TECHNIQUE: Multidetector CT imaging of the abdomen and pelvis was performed using the standard protocol following bolus administration of intravenous contrast.  CONTRAST:  1035mOMNIPAQUE IOHEXOL 300 MG/ML  SOLN  COMPARISON:  PET-CT on 09/13/2013  FINDINGS: Images through the lung bases show no significant change in small to moderate bilateral pleural effusions.  Mild diffuse biliary ductal dilatation is again seen as well as porcelain gallbladder. No evidence of acute cholecystitis. No liver masses are identified. The pancreas, spleen, adrenal glands, and left kidney are normal in appearance. 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 A congenital Morris UPJ obstruction cannot be excluded.  No abdominal or pelvic soft tissue masses or lymphadenopathy identified. No evidence of acute inflammatory  process or abnormal fluid collections. Colonic diverticulosis noted, however there is no evidence of diverticulitis. Tiny amount of free fluid noted in pelvic cul-de-sac, which is nonspecific. Mild diffuse body wall edema noted.  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 No other suspicious bone lesions identified.  IMPRESSION: No acute findings within the abdomen or pelvis.  Stable severe Morris hydronephrosis. Etiology not apparent by CT. Congenital UPJ obstruction cannot be excluded.  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   Electronically Signed   By: Earle Gell M.D.   On: 09/17/2013 18:57   Nm Pet Image Restag (ps) Skull Base To Thigh 09/13/2013   CLINICAL DATA:  Subsequent treatment strategy for bilateral breast cancer.  EXAM: NUCLEAR MEDICINE PET SKULL BASE TO THIGH  FASTING BLOOD GLUCOSE:  Value: 17m/dl  TECHNIQUE: 16.4 mCi F-18 FDG was injected intravenously. CT data was obtained and used for attenuation correction and anatomic localization only. (This was not acquired as a diagnostic CT examination.) Additional exam technical data entered on technologist worksheet.  COMPARISON:  NM PET IMAGE RESTAG (PS) SKULL BASE TO THIGH dated 07/02/2013  FINDINGS: NECK  There is persistent but improved bilateral supraclavicular hypermetabolic adenopathy.These are ill-defined and somewhat difficult to measure, although measure approximately 2.2 cm on the Morris (image 45, SUV max 8.4) and 1.9 cm on the left (image 49, SUV max 7.0). There is no upper cervical adenopathy. Asymmetric goiter appears stable without abnormal metabolic activity.  CHEST  There has been  interval improvement in the multifocal activity within the left chest wall and axilla. There is a residual 2.5 cm component adjacent to the left clavicle on image 41. There is no mediastinal or hilar hypermetabolic nodal activity. Diffuse muscular activity is again noted within the upper Morris arm, similar to the prior study. Substernal extension of goiter, coronary artery disease and bilateral pleural effusions are again noted. No residual abnormal activity is seen within the Morris pleural space.  ABDOMEN/PELVIS  There is no hypermetabolic activity within the liver, adrenal glands, spleen or pancreas. There is no significant residual central mesenteric hypermetabolic activity. The left external iliac node noted previously has resolved. There is new dilatation of the Morris renal pelvis and proximal ureter without high-grade ureteral obstruction. Porcelain gallbladder again noted.  SKELETON  Multifocal osseous metastases are again noted. The upper thoracic and rib activity noted previously has improved. However, there is worsening activity within the lower thoracic spine. This corresponds with enlarging lytic lesions involving the left aspects of the T9, T10 and T11 vertebral bodies and posterior elements (CT images 82- 100). There is left paraspinal soft tissue tumor as well as possible epidural tumor, difficult to assess given streak artifact from the patient's arms. Additional lesions are present within the ribs and bony pelvis.  IMPRESSION: 1. There has been improvement in the left chest wall activity and multiple nodal metastasis involving the left axilla and supraclavicular stations bilaterally. 2. Persistent bilateral pleural effusions without associated abnormal metabolic activity. 3. Fluctuating osseous metastases with improvement in the upper thoracic spine and upper Morris chest wall. There is worsening disease in the lower left thoracic spine with the potential for epidural tumor in this region. Again,  spinal assessment is limited by body habitus. If the patient has focal neurologic symptoms, further evaluation with MRI may be helpful.   Electronically Signed   By: BCamie PatienceM.D.   On:  09/13/2013 13:58    ASSESSMENT: 68 y.o. Jeanette Morris woman with stage IV breast cancer  (1) status post Morris axillary lymph node biopsy 05/27/2010 for a clinical T4 N1, stage IIIB (inflammatory) invasive ductal carcinoma, grade not stated, estrogen receptor 94% positive, progesterone receptor 64% positive, with an MIB-1 of 60%, and HER-2 amplification by [facility] with a ratio of 7.5.  (2) neoadjuvant chemotherapy consisted of 6 cycles of standard carboplatin, docetaxel, trastuzumab, with the trastuzumab held cycles 5 and 6 due to a drop in her borderline ejection fraction (from 45-50% at baseline to 40-45% January 2012)-- total trastuzumab treatment = 2 months  (3) status post Morris mastectomy and axillary lymph node dissection 12/01/2010, showing no residual invasive carcinoma in the breast, but residual tumor in 20 of 21 axillary lymph nodes. (Only one of the 20 positive lymph nodes showed a significant residual tumor, measuring 5 mm; the remaining lymph nodes, including the evidence of extracapsular extension, showed single and clusters a few neoplastic cells with extensive therapy related changes).  (4) postmastectomy radiation to the Morris chest wall, axilla, and supraclavicular region completed 03/07/2011  (5) additional 2 months of trastuzumab given February through April 2013, again discontinued because of a drop in the ejection fraction and lateral S'  (6) on letrozole July 2012 to September 2013  (7) LEFT axillary adenopathy noted by Dr. Marlou Morris on exam July 2013, with biopsy of a left axillary node 04/17/2012 showing an invasive ductal carcinoma, estrogen receptor 91% positive, progesterone receptor 9% positive, with an MIB-1 of 45%, and no HER-2 amplification.   (8) PET scan 04/03/2012 showed multiple  enlarged left axillary and small left supraclavicular lymph nodes that are moderately hypermetabolic, with some hypermetabolic activity within the left axillary tail. No other hypermetabolic lymph nodes identified. There was no involvement of the Morris axilla, mediastinum or Morris chest wall and no extrathoracic metastases were demonstrated.  (8) on exemestane and everolimus September through December 2013  (9) s/p Morris thoracentesis 08/01/2012, cytologically positive; s/p temporary Pleurx; effusion resolved after Abraxane therapy  (10) Abraxane started 08/16/2012, given day 1 and day 8 of each 21 day cycle, completed 6 cycles (12 doses) 12/06/2012, with excellent response  (11) fulvestrant started 12/19/2012, stopped 04/11/2013 with progression  (12) trastuzumab resumed 12/21/2011. We scheduled the doses 4 weeks apart to see if they caould be better tolerated. However echo 02/04/2013 again showed a drop in the EF and S', so Herceptin stopped (last dose 01/17/2013). Most recent echo, 04/18/2013, shows the ejection fraction to have recovered.  (13) status post left modified radical mastectomy 05/08/2013 for a pT4, pN2a [tumor extended over 15 cm, all 6 sampled lymph nodes were involved) invasive lobular breast cancer, grade 3, which was HER-2 negative. It was estrogen receptor positive at 72%, progesterone receptor negative. Posterior margin was broadly positive  (14) tamoxifen started 05/29/2013, discontinued December 2014 with progression  (15) post-mastectomy irradiation to left chest wall and left [place] fossa planned, beginning 07/09/2013  (16) new baseline staging scans including a chest CT and PET scan were obtained 07/02/2013 suggesting progression of disease, but that is when compared to previous scans in August 2013. However in December of 2014 there was obvious disease progression on the chest wall  (17) Abraxane resumed 08/07/2013, the plan being for treatments day 1 and day 8 of every 21  day cycle, with restaging after 2 full cycles.  PLAN:  Recent scans showed evidence of response to arbors current regimen of Abraxane. I have reviewed her case with  Dr. Jana Hakim, and we are ready to proceed as planned today with day 1 cycle 3. We will continue to treat on days one and 8 with Neulasta given on day 9.  With regards to Zyniah's pain, the 20 mg of OxyContin seems to be giving her minimal relief throughout the day. We're going to increase her dose to 40 mg twice daily. She will still have oxycodone 5 mg to take, one tablet every 4 hours as needed for breakthrough pain. Hopefully with the increase in the OxyContin, her pain will improve, and she will need less pain medication throughout the day. Of course her biggest concerns are keeping her comfortable and improving functional status and quality of life.   With regards to the numbness and weakness in the Morris hand, we are referring her back to the lymphedema clinic to resume treatment. We discussed a referral for further evaluation with Dr. Daylene Katayama but Pamala Hurry would like to "put that on hold" for now, at least until she gets her pain better controlled.  Our plan is to complete these next 3 cycles (for total of 5 cycles/10 doses of Abraxane), then repeat a chest CT to assess response. All of the above plan was reviewed in detail with both Pamala Hurry and [place] today, and they voiced understanding and agreement. As always, they know to call with any changes or problems prior to her next appointment. She is scheduled to see [place] again next week prior to day 8 cycle 3 on February 4.  Of note, I will mention that we draw all of Idolina's labs through her port. I am having them schedule a flush appointment with each lab appointment so that this may be accommodated prior to her office visits.   Julyan Gales, PA-C     09/25/2013

## 2013-09-26 ENCOUNTER — Ambulatory Visit: Payer: PRIVATE HEALTH INSURANCE | Admitting: Physical Therapy

## 2013-09-26 ENCOUNTER — Ambulatory Visit: Payer: PRIVATE HEALTH INSURANCE

## 2013-09-27 ENCOUNTER — Ambulatory Visit: Payer: PRIVATE HEALTH INSURANCE | Admitting: Nutrition

## 2013-09-27 NOTE — Progress Notes (Signed)
68 year old female patient with metastatic breast cancer.  She is a patient of Dr. Jana Hakim.  Past medical history includes hypertension, hyperlipidemia, arthritis, TB, and radiation therapy.  Medications include calcium with vitamin D, marinol,  ferrous sulfate, Reglan, Zofran, MiraLax, K-Dur, and Compazine.  Labs were reviewed.  Height: 64 inches. Weight: 224 pounds. Usual body weight 239 pounds December 2014. BMI: 38.43.  Patient reports nausea, which is improved when she takes her nausea medication.  She denies vomiting.  Her bowel movements are normal.  She recently tried clear nutrition supplements, which she does enjoy.  She does not care for regular ensure or boost or resource breeze.  Patient only able to consume small amounts of food at one time.  Patient is positive for 6% weight loss in the past 2 months.  Nutrition diagnosis: Unintended weight loss related to poor appetite and nausea.  As evidenced by 6 percent weight loss in 2 months.  Intervention: I educated patient on a healthy, plant-based diet with increased protein to promote maintenance of lean body mass.  I have reviewed, high-protein foods with patient.  I've educated her on strategies for eating with nausea.  I provided patient with samples of oral nutrition supplements.  I've also given fact sheets, and my contact information.  Teach back method used.  Monitoring, evaluation, goals: Patient will tolerate adequate calories and protein to promote maintenance of lean body mass.  She will drink 2-3 oral nutrition supplements daily.  Next visit: Patient agrees to contact me with questions or concerns.

## 2013-09-29 ENCOUNTER — Encounter: Payer: Self-pay | Admitting: Radiation Oncology

## 2013-09-29 NOTE — Progress Notes (Signed)
  Radiation Oncology         [phone] ________________________________  Name: Jeanette Morris MRN: [id number]  Date: 09/29/2013  DOB: 07/01/1946  End of Treatment Note  Diagnosis:   Metastatic breast cancer     Indication for treatment:  Painful subcutaneous involvement in the left lower chest region       Radiation treatment dates:   January 23, January 26, January 27  Site/dose:   Left lower chest region, 12 Gray in 3 fractions  Beams/energy:   The patient was treated with a custom electron cutout field using 12 MeV electrons  Narrative: The patient tolerated radiation treatment relatively well.   She had not noticed any improvement in her pain at the completion of therapy  Plan: The patient has completed radiation treatment. The patient will return to radiation oncology clinic for routine followup in one month. I advised them to call or return sooner if they have any questions or concerns related to their recovery or treatment.  -----------------------------------  Blair Promise, PhD, MD

## 2013-10-01 ENCOUNTER — Ambulatory Visit: Payer: PRIVATE HEALTH INSURANCE | Attending: Oncology | Admitting: Physical Therapy

## 2013-10-01 DIAGNOSIS — I890 Lymphedema, not elsewhere classified: Secondary | ICD-10-CM | POA: Diagnosis not present

## 2013-10-01 DIAGNOSIS — IMO0001 Reserved for inherently not codable concepts without codable children: Secondary | ICD-10-CM | POA: Diagnosis present

## 2013-10-01 DIAGNOSIS — M25519 Pain in unspecified shoulder: Secondary | ICD-10-CM | POA: Insufficient documentation

## 2013-10-02 ENCOUNTER — Ambulatory Visit (HOSPITAL_BASED_OUTPATIENT_CLINIC_OR_DEPARTMENT_OTHER): Payer: PRIVATE HEALTH INSURANCE

## 2013-10-02 ENCOUNTER — Other Ambulatory Visit: Payer: Self-pay | Admitting: *Deleted

## 2013-10-02 ENCOUNTER — Ambulatory Visit: Payer: PRIVATE HEALTH INSURANCE | Admitting: Physician Assistant

## 2013-10-02 ENCOUNTER — Telehealth: Payer: Self-pay | Admitting: *Deleted

## 2013-10-02 ENCOUNTER — Other Ambulatory Visit (HOSPITAL_BASED_OUTPATIENT_CLINIC_OR_DEPARTMENT_OTHER): Payer: PRIVATE HEALTH INSURANCE

## 2013-10-02 ENCOUNTER — Other Ambulatory Visit: Payer: Self-pay | Admitting: Oncology

## 2013-10-02 ENCOUNTER — Other Ambulatory Visit: Payer: PRIVATE HEALTH INSURANCE

## 2013-10-02 ENCOUNTER — Ambulatory Visit (HOSPITAL_BASED_OUTPATIENT_CLINIC_OR_DEPARTMENT_OTHER): Payer: PRIVATE HEALTH INSURANCE | Admitting: Oncology

## 2013-10-02 VITALS — BP 130/81 | HR 112 | Temp 94.8°F | Resp 18 | Ht 64.0 in | Wt 216.0 lb

## 2013-10-02 DIAGNOSIS — C7951 Secondary malignant neoplasm of bone: Secondary | ICD-10-CM

## 2013-10-02 DIAGNOSIS — C50919 Malignant neoplasm of unspecified site of unspecified female breast: Secondary | ICD-10-CM

## 2013-10-02 DIAGNOSIS — J910 Malignant pleural effusion: Secondary | ICD-10-CM

## 2013-10-02 DIAGNOSIS — C773 Secondary and unspecified malignant neoplasm of axilla and upper limb lymph nodes: Secondary | ICD-10-CM

## 2013-10-02 DIAGNOSIS — J90 Pleural effusion, not elsewhere classified: Secondary | ICD-10-CM

## 2013-10-02 DIAGNOSIS — M899 Disorder of bone, unspecified: Secondary | ICD-10-CM

## 2013-10-02 DIAGNOSIS — C50411 Malignant neoplasm of upper-outer quadrant of right female breast: Secondary | ICD-10-CM

## 2013-10-02 DIAGNOSIS — Z452 Encounter for adjustment and management of vascular access device: Secondary | ICD-10-CM

## 2013-10-02 DIAGNOSIS — M949 Disorder of cartilage, unspecified: Secondary | ICD-10-CM

## 2013-10-02 DIAGNOSIS — Z5111 Encounter for antineoplastic chemotherapy: Secondary | ICD-10-CM

## 2013-10-02 DIAGNOSIS — C50412 Malignant neoplasm of upper-outer quadrant of left female breast: Secondary | ICD-10-CM

## 2013-10-02 DIAGNOSIS — D649 Anemia, unspecified: Secondary | ICD-10-CM

## 2013-10-02 DIAGNOSIS — D630 Anemia in neoplastic disease: Secondary | ICD-10-CM

## 2013-10-02 DIAGNOSIS — I890 Lymphedema, not elsewhere classified: Secondary | ICD-10-CM

## 2013-10-02 DIAGNOSIS — R109 Unspecified abdominal pain: Secondary | ICD-10-CM

## 2013-10-02 DIAGNOSIS — Z170 Estrogen receptor positive status [ER+]: Secondary | ICD-10-CM

## 2013-10-02 LAB — CBC WITH DIFFERENTIAL/PLATELET
BASO%: 0.6 % (ref 0.0–2.0)
Basophils Absolute: 0 10*3/uL (ref 0.0–0.1)
EOS%: 0.8 % (ref 0.0–7.0)
Eosinophils Absolute: 0 10*3/uL (ref 0.0–0.5)
HCT: 25.9 % — ABNORMAL LOW (ref 34.8–46.6)
HGB: 8.3 g/dL — ABNORMAL LOW (ref 11.6–15.9)
LYMPH%: 17.6 % (ref 14.0–49.7)
MCH: 28.7 pg (ref 25.1–34.0)
MCHC: 31.9 g/dL (ref 31.5–36.0)
MCV: 90 fL (ref 79.5–101.0)
MONO#: 0.2 10*3/uL (ref 0.1–0.9)
MONO%: 4.8 % (ref 0.0–14.0)
NEUT#: 2.5 10*3/uL (ref 1.5–6.5)
NEUT%: 76.2 % (ref 38.4–76.8)
Platelets: 145 10*3/uL (ref 145–400)
RBC: 2.87 10*6/uL — ABNORMAL LOW (ref 3.70–5.45)
RDW: 15.6 % — ABNORMAL HIGH (ref 11.2–14.5)
WBC: 3.3 10*3/uL — ABNORMAL LOW (ref 3.9–10.3)
lymph#: 0.6 10*3/uL — ABNORMAL LOW (ref 0.9–3.3)

## 2013-10-02 LAB — COMPREHENSIVE METABOLIC PANEL (CC13)
ALBUMIN: 2.6 g/dL — AB (ref 3.5–5.0)
ALK PHOS: 66 U/L (ref 40–150)
ALT: 18 U/L (ref 0–55)
AST: 29 U/L (ref 5–34)
Anion Gap: 5 mEq/L (ref 3–11)
BILIRUBIN TOTAL: 0.9 mg/dL (ref 0.20–1.20)
BUN: 17.5 mg/dL (ref 7.0–26.0)
CO2: 31 mEq/L — ABNORMAL HIGH (ref 22–29)
Calcium: 9.3 mg/dL (ref 8.4–10.4)
Chloride: 97 mEq/L — ABNORMAL LOW (ref 98–109)
Creatinine: 1 mg/dL (ref 0.6–1.1)
Glucose: 132 mg/dl (ref 70–140)
Potassium: 4.8 mEq/L (ref 3.5–5.1)
SODIUM: 133 meq/L — AB (ref 136–145)
Total Protein: 6.4 g/dL (ref 6.4–8.3)

## 2013-10-02 MED ORDER — PACLITAXEL PROTEIN-BOUND CHEMO INJECTION 100 MG
100.0000 mg/m2 | Freq: Once | INTRAVENOUS | Status: AC
  Administered 2013-10-02: 225 mg via INTRAVENOUS
  Filled 2013-10-02: qty 45

## 2013-10-02 MED ORDER — SODIUM CHLORIDE 0.9 % IV SOLN
Freq: Once | INTRAVENOUS | Status: AC
  Administered 2013-10-02: 16:00:00 via INTRAVENOUS

## 2013-10-02 MED ORDER — ZOLEDRONIC ACID 4 MG/100ML IV SOLN
4.0000 mg | Freq: Once | INTRAVENOUS | Status: AC
  Administered 2013-10-02: 4 mg via INTRAVENOUS
  Filled 2013-10-02: qty 100

## 2013-10-02 MED ORDER — ONDANSETRON 8 MG/NS 50 ML IVPB
INTRAVENOUS | Status: AC
  Filled 2013-10-02: qty 8

## 2013-10-02 MED ORDER — HEPARIN SOD (PORK) LOCK FLUSH 100 UNIT/ML IV SOLN
500.0000 [IU] | Freq: Once | INTRAVENOUS | Status: AC
  Administered 2013-10-02: 500 [IU] via INTRAVENOUS
  Filled 2013-10-02: qty 5

## 2013-10-02 MED ORDER — OXYCODONE HCL 5 MG PO TABS: 5.0000 mg | ORAL_TABLET | ORAL | Status: DC | PRN

## 2013-10-02 MED ORDER — DEXAMETHASONE SODIUM PHOSPHATE 10 MG/ML IJ SOLN
10.0000 mg | Freq: Once | INTRAMUSCULAR | Status: AC
  Administered 2013-10-02: 10 mg via INTRAVENOUS

## 2013-10-02 MED ORDER — OXYCODONE HCL ER 40 MG PO T12A: 40.0000 mg | EXTENDED_RELEASE_TABLET | Freq: Two times a day (BID) | ORAL | Status: DC

## 2013-10-02 MED ORDER — SODIUM CHLORIDE 0.9 % IJ SOLN
10.0000 mL | INTRAMUSCULAR | Status: DC | PRN
  Administered 2013-10-02: 10 mL
  Filled 2013-10-02: qty 10

## 2013-10-02 MED ORDER — SODIUM CHLORIDE 0.9 % IJ SOLN
10.0000 mL | INTRAMUSCULAR | Status: DC | PRN
  Administered 2013-10-02: 10 mL via INTRAVENOUS
  Filled 2013-10-02: qty 10

## 2013-10-02 MED ORDER — HEPARIN SOD (PORK) LOCK FLUSH 100 UNIT/ML IV SOLN
500.0000 [IU] | Freq: Once | INTRAVENOUS | Status: AC | PRN
  Administered 2013-10-02: 500 [IU]
  Filled 2013-10-02: qty 5

## 2013-10-02 MED ORDER — ONDANSETRON 8 MG/50ML IVPB (CHCC)
8.0000 mg | Freq: Once | INTRAVENOUS | Status: AC
  Administered 2013-10-02: 8 mg via INTRAVENOUS

## 2013-10-02 MED ORDER — DEXAMETHASONE SODIUM PHOSPHATE 10 MG/ML IJ SOLN
INTRAMUSCULAR | Status: AC
  Filled 2013-10-02: qty 1

## 2013-10-02 NOTE — Progress Notes (Signed)
ID: Jeanette Morris   DOB: 03/27/1946  MR#: [id number]  CSN#:[id number]  PCP: Jeanette Pillow, NP GYN:  [facility] OTHER MD: Jeanette Morris, Gery Pray, Arvid Right  CHIEF COMPLAINT:  Metastatic Breast Cancer   HISTORY OF PRESENT ILLNESS: The patient undergoes annual screening mammography.  Her mammograms previously had fullness.  In August 2011 she presented with some firmness of her right breast. She also noted some skin thickening around her nipple and some tenderness.  She was subsequently seen for this on 05/13/2010 and was referred for bilateral mammogram and right breast ultrasound.  There was concern that this may be mastitis.  There were diffuse skin thickening, tissue edema, enlarged lymph node in right axilla, some subareolar distortion secondary to the previous surgery.  She was given a course of doxycycline for 10 days and asked to return. A follow-up ultrasound of the breast showed persistent abnormalities.  She was referred for biopsy.  She underwent biopsy of the right axilla on 05/27/2010 and it showed metastatic carcinoma consistent with breast primary.  This was ER and PR positive, HER-2 was positive with a ratio of 3.09.  The ER ratio is 34%, PR was 64%, proliferative index 60%.  She subsequently had a MRI scan of both breasts on 06/03/2010 and this showed a area of minimal enhancement in the right breast measuring 10.0 x 6.0 x 3.0 cm.  Enlarged lymph nodes were seen in the right axilla.  Largest node measuring 3.0 x 1.0 x 7.0 cm.  Intramammary lymph nodes were also seen in the outer left breast, the largest measuring 2.8 x 2.5 cm.  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   The patient's subsequent history is as detailed below.  INTERVAL HISTORY: Jeanette Morris returns today accompanied by her husband Jeanette Morris for followup of her metastatic breast cancer. Today is day 8 cycle 3 of her Abraxane treatments, which are  given day 1 and 8 of every 21 day cycle. She received Neulasta on day 9.  REVIEW OF SYSTEMS: Jeanette Morris is doing "good". She is getting her right arm wrapped twice a week at the outpatient physical therapy facility. She still has decreased sensation in the right hand. Her appetite is down but her sense of taste is "okay". She takes stool softeners daily but ran out of MiraLAX and so became a bit constipated because of the narcotics. Her pain is well controlled on 40 mg of OxyContin twice daily. She can take OxyIR every 4 hours in between but she usually does at no more than once a day. There has been no nausea or vomiting, no mouth sores, no confusion, no rash, and no bleeding. Her port is functioning well. A detailed review of systems was otherwise stable  PAST MEDICAL HISTORY: Past Medical History  Diagnosis Date  . Hypertension   . Hearing loss     right ear  . Hyperlipidemia   . Arthritis   . Dysrhythmia     A fib with IV chemo treatments  . Shortness of breath     with exertion  . 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 Breast cancer     (Rt) breast ca dx 9/11  . 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 Neuropathy   . History of radiation therapy 01/18/11-03/07/11    5940 cGy to Right chest wall, axilla and supraclavicular region  . Lymphedema of arm 08/28/2013  .  Tuberculosis     15 yrs ago- treated for 1 yeat    PAST SURGICAL HISTORY: Past Surgical History  Procedure Laterality Date  . Tubal ligation    . Portacath placement Left   . Abdominal hysterectomy    . Lung biopsy    . Lesion excision      L anterior thigh  . Breast surgery  12/01/10    ER+,PR+,Ki 67 64%,Her2 -; right breast lumpectomy  . Chest tube insertion  08/08/2012    Procedure: INSERTION PLEURAL DRAINAGE CATHETER;  Surgeon: Jeanette Pollack, MD;  Location: [facility];  Service: Thoracic;  Laterality: Right;  . Removal of pleural drainage catheter Right 10/10/2012    Procedure: REMOVAL OF PLEURAL DRAINAGE CATHETER;  Surgeon:  Jeanette Pollack, MD;  Location: [facility];  Service: Thoracic;  Laterality: Right;  . Mastectomy, radical Left 05/08/2013    Dr Jeanette Morris  . Mastectomy modified radical Left 05/08/2013    Procedure: MASTECTOMY MODIFIED RADICAL;  Surgeon: Jeanette Roof, MD;  Location: [facility];  Service: General;  Laterality: Left;    FAMILY HISTORY No family history on file. The patient's father died in an automobile accident at age 26. The patient's mother died at age 29 with Alzheimer's disease. The patient had 8 brothers and 4 sisters. There is no history of breast or ovarian cancer in the family.  GYNECOLOGIC HISTORY: Menarche age 20, first live birth age 65. She is GX P7. She underwent menopause "more than 20 years ago". She never took hormone replacement.  SOCIAL HISTORY:  (Updated 09/25/2013) She has been mostly a housewife. Her husband Jeanette Morris used to be a cook at [employer]. He completed radiation therapy for prostate cancer under [facility]lo[facility] Koh April of 2014. The patient's children are from an earlier marriage, and include Jeanette Morris, [place], [place], [place], Jeanette Morris, [place], and [employer]. The patient has 15 grandchildren and 1 great-grandchild. She attends [employer]  ADVANCED DIRECTIVES: Not in place  HEALTH MAINTENANCE: (Updated 09/25/2013) History  Substance Use Topics  . Smoking status: Former Smoker    Quit date: 06/30/1973  . Smokeless tobacco: Never Used  . Alcohol Use: No     Colonoscopy:  Not on file  PAP: Not on file  Bone density:  August 2012, Normal  Lipid panel: Not on file   No Known Allergies    Objective: Middle-aged [place] American woman examined in a wheelchair.     Filed Vitals:   10/02/13 1451  BP: 130/81  Pulse: 112  Temp: 94.8 F (34.9 C)  Resp: 18    Body mass index is 37.06 kg/(m^2).    ECOG FS: 2 Filed Weights   10/02/13 1451  Weight: 216 lb (97.977 kg)   Sclerae unicteric, EOMs intact Oropharynx  clear and moist-- no thrush No cervical or supraclavicular adenopathy Lungs no rales or rhonchi Heart regular rate and rhythm Abd soft, obese, nontender, positive bowel sounds MSK bilateral opportunity lymphedema is stable; right upper extremity is wrapped Neuro: nonfocal, well oriented, appropriate affect Breasts: Status post bilateral mastectomies. Both axillae are benign. The skin lesions that we noted a few months ago her no longer palpable   LAB RESULTS: Lab Results  Component Value Date   WBC 3.3* 10/02/2013   NEUTROABS 2.5 10/02/2013   HGB 8.3* 10/02/2013   HCT 25.9* 10/02/2013   MCV 90.0 10/02/2013   PLT 145 10/02/2013      Chemistry      Component  Value Date/Time   NA 133* 10/02/2013 1418   NA 134* 09/23/2013 0440   K 4.8 10/02/2013 1418   K 5.2 09/23/2013 0440   CL 97 09/23/2013 0440   CL 106 02/14/2013 1048   CO2 31* 10/02/2013 1418   CO2 32 09/23/2013 0440   BUN 17.5 10/02/2013 1418   BUN 11 09/23/2013 0440   CREATININE 1.0 10/02/2013 1418   CREATININE 1.35* 09/23/2013 0440      Component Value Date/Time   CALCIUM 9.3 10/02/2013 1418   CALCIUM 9.2 09/23/2013 0440   ALKPHOS 66 10/02/2013 1418   ALKPHOS 66 09/23/2013 0440   AST 29 10/02/2013 1418   AST 18 09/23/2013 0440   ALT 18 10/02/2013 1418   ALT 7 09/23/2013 0440   BILITOT 0.90 10/02/2013 1418   BILITOT 0.3 09/23/2013 0440       Lab Results  Component Value Date   LABCA2 95* 08/14/2012    STUDIES: Dg Chest 2 View 09/17/2013   CLINICAL DATA:  Left upper quadrant pain.  EXAM: CHEST  2 VIEW  COMPARISON:  08/09/2013  FINDINGS: Port-A-Cath tip in the upper SVC and in a stable horizontal orientation. Evidence for small bilateral pleural effusions. Subtle densities in the right upper lung could represent asymmetric airspace disease. Heart size is upper limits of normal but unchanged. Surgical clips in the axilla bilaterally. 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  IMPRESSION: Small bilateral pleural  effusions.  Subtle lung densities, particularly on the right side. Findings could represent mild edema.   Electronically Signed   By: Markus Daft M.D.   On: 09/17/2013 17:16   Ct Chest W Contrast 09/13/2013   CLINICAL DATA:  History of right breast cancer diagnosed in 2011 and left breast cancer diagnosed in 2014. Chemotherapy in progress. Bilateral arm lymphedema and shortness of breath.  EXAM: CT CHEST WITH CONTRAST  TECHNIQUE: Multidetector CT imaging of the chest was performed during intravenous contrast administration.  CONTRAST:  42mL OMNIPAQUE IOHEXOL 300 MG/ML  SOLN  COMPARISON:  NM PET IMAGE RESTAG (PS) SKULL BASE TO THIGH dated 09/13/2013; CT ANGIO CHEST W/CM &/OR WO/CM dated 07/22/2013  FINDINGS: Left subclavian Port-A-Cath tip is unchanged in the upper SVC. 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  There is residual supraclavicular soft tissue fullness bilaterally, hypermetabolic on today's PET-CT and consistent with residual metastatic adenopathy. This has improved from the prior studies, although is not well defined on CT. On the left, this measures approximately 3.5 x 2.4 cm on image 6. A 2.0 cm soft tissue nodule adjacent to the left clavicle on image number 1 is hypermetabolic on PET. The subcutaneous nodularity throughout the left chest wall and axilla has improved. There is less asymmetric enlargement of the left pectoralis muscle.  There are no enlarged mediastinal, hilar or internal mammary lymph nodes. Small bilateral pleural effusions are present. There is no pericardial effusion. The heart is enlarged. There are diffuse coronary artery calcifications.  Mild dependent atelectasis is present in both lower lobes. There are no suspicious pulmonary nodules.  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 There is no adrenal mass.  Multiple lytic metastases involving the lower thoracic spine have progressed. These are most prominent within the  left aspects of T9 through T12 vertebral bodies. There is destruction of the left eleventh rib at the costovertebral junction. There is adjacent left paraspinal tumor extending into the neural foramina. No gross intraspinal extension is identified. Multiple  rib metastases are demonstrated with a probable pathologic fracture of the right seventh rib posteriorly. There is also comminuted and displaced fracture of the medial right clavicle.  IMPRESSION: 1. Interval improvement in left chest wall tumor and bilateral supraclavicular nodal metastases. 2. No mediastinal adenopathy or pulmonary metastases identified. 3. Small bilateral pleural effusions without nodularity or hypermetabolism on today's PET-CT. 4. Multifocal osseous metastatic disease with progression, especially in the lower thoracic spine and left paraspinal soft tissues. Rib and right clavicle fractures are noted.   Electronically Signed   By: Camie Patience M.D.   On: 09/13/2013 14:50   Ct Abdomen Pelvis W Contrast 09/17/2013   CLINICAL DATA:  Left-sided abdominal pain. Vomiting. Breast carcinoma.  EXAM: CT ABDOMEN AND PELVIS WITH CONTRAST  TECHNIQUE: Multidetector CT imaging of the abdomen and pelvis was performed using the standard protocol following bolus administration of intravenous contrast.  CONTRAST:  147mL OMNIPAQUE IOHEXOL 300 MG/ML  SOLN  COMPARISON:  PET-CT on 09/13/2013  FINDINGS: Images through the lung bases show no significant change in small to moderate bilateral pleural effusions.  Mild diffuse biliary ductal dilatation is again seen as well as porcelain gallbladder. No evidence of acute cholecystitis. No liver masses are identified. The pancreas, spleen, adrenal glands, and left kidney are normal in appearance. Severe right hydronephrosis is again demonstrated, however no obstructing calculus or mass is visualized, and this is stable since previous study. A congenital right UPJ obstruction cannot be excluded.  No abdominal or pelvic  soft tissue masses or lymphadenopathy identified. No evidence of acute inflammatory process or abnormal fluid collections. Colonic diverticulosis noted, however there is no evidence of diverticulitis. Tiny amount of free fluid noted in pelvic cul-de-sac, which is nonspecific. Mild diffuse body wall edema noted.  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 No other suspicious bone lesions identified.  IMPRESSION: No acute findings within the abdomen or pelvis.  Stable severe right hydronephrosis. Etiology not apparent by CT. Congenital UPJ obstruction cannot be excluded.  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   Electronically Signed   By: Earle Gell M.D.   On: 09/17/2013 18:57   Nm Pet Image Restag (ps) Skull Base To Thigh 09/13/2013   CLINICAL DATA:  Subsequent treatment strategy for bilateral breast cancer.  EXAM: NUCLEAR MEDICINE PET SKULL BASE TO THIGH  FASTING BLOOD GLUCOSE:  Value: $Remov'112mg'mFwnSV$ /dl  TECHNIQUE: 16.4 mCi F-18 FDG was injected intravenously. CT data was obtained and used for attenuation correction and anatomic localization only. (This was not acquired as a diagnostic CT examination.) Additional exam technical data entered on technologist worksheet.  COMPARISON:  NM PET IMAGE RESTAG (PS) SKULL BASE TO THIGH dated 07/02/2013  FINDINGS: NECK  There is persistent but improved bilateral supraclavicular hypermetabolic adenopathy.These are ill-defined and somewhat difficult to measure, although measure approximately 2.2 cm on the right (image 45, SUV max 8.4) and 1.9 cm on the left (image 49, SUV max 7.0). There is no upper cervical adenopathy. Asymmetric goiter appears stable without abnormal metabolic activity.  CHEST  There has been interval improvement in the multifocal activity within the left chest wall and axilla. There is a residual 2.5 cm component adjacent  to the left clavicle on image 41. There is no mediastinal or hilar hypermetabolic nodal activity. Diffuse muscular activity is again noted within the upper right arm, similar to the prior study. Substernal extension of goiter, coronary  artery disease and bilateral pleural effusions are again noted. No residual abnormal activity is seen within the right pleural space.  ABDOMEN/PELVIS  There is no hypermetabolic activity within the liver, adrenal glands, spleen or pancreas. There is no significant residual central mesenteric hypermetabolic activity. The left external iliac node noted previously has resolved. There is new dilatation of the right renal pelvis and proximal ureter without high-grade ureteral obstruction. Porcelain gallbladder again noted.  SKELETON  Multifocal osseous metastases are again noted. The upper thoracic and rib activity noted previously has improved. However, there is worsening activity within the lower thoracic spine. This corresponds with enlarging lytic lesions involving the left aspects of the T9, T10 and T11 vertebral bodies and posterior elements (CT images 82- 100). There is left paraspinal soft tissue tumor as well as possible epidural tumor, difficult to assess given streak artifact from the patient's arms. Additional lesions are present within the ribs and bony pelvis.  IMPRESSION: 1. There has been improvement in the left chest wall activity and multiple nodal metastasis involving the left axilla and supraclavicular stations bilaterally. 2. Persistent bilateral pleural effusions without associated abnormal metabolic activity. 3. Fluctuating osseous metastases with improvement in the upper thoracic spine and upper right chest wall. There is worsening disease in the lower left thoracic spine with the potential for epidural tumor in this region. Again, spinal assessment is limited by body habitus. If the patient has focal neurologic symptoms, further evaluation with MRI may be helpful.    Electronically Signed   By: Camie Patience M.D.   On: 09/13/2013 13:58    ASSESSMENT: 68 y.o. [place] woman with stage IV breast cancer  (1) status post RIGHT axillary lymph node biopsy 05/27/2010 for a clinical T4 N1, stage IIIB (inflammatory) invasive ductal carcinoma, grade not stated, estrogen receptor 94% positive, progesterone receptor 64% positive, with an MIB-1 of 60%, and HER-2 amplification by [facility] with a ratio of 7.5.  (2) neoadjuvant chemotherapy consisted of 6 cycles of standard carboplatin, docetaxel, trastuzumab, with the trastuzumab held cycles 5 and 6 due to a drop in her borderline ejection fraction (from 45-50% at baseline to 40-45% January 2012)-- total trastuzumab treatment = 2 months  (3) status post right mastectomy and axillary lymph node dissection 12/01/2010, showing no residual invasive carcinoma in the breast, but residual tumor in 20 of 21 axillary lymph nodes. (Only one of the 20 positive lymph nodes showed a significant residual tumor, measuring 5 mm; the remaining lymph nodes, including the evidence of extracapsular extension, showed single and clusters a few neoplastic cells with extensive therapy related changes).  (4) postmastectomy radiation to the right chest wall, axilla, and supraclavicular region completed 03/07/2011  (5) additional 2 months of trastuzumab given February through April 2013, again discontinued because of a drop in the ejection fraction and lateral S'  (6) on letrozole July 2012 to September 2013  (7) LEFT axillary adenopathy noted by Dr. Marlou Morris on exam July 2013, with biopsy of a left axillary node 04/17/2012 showing an invasive ductal carcinoma, estrogen receptor 91% positive, progesterone receptor 9% positive, with an MIB-1 of 45%, and no HER-2 amplification.   (8) PET scan 04/03/2012 showed multiple enlarged left axillary and small left supraclavicular lymph nodes that are moderately hypermetabolic, with some hypermetabolic activity within  the left axillary tail. No other hypermetabolic lymph nodes identified. There was no involvement of the right axilla, mediastinum or right chest wall and no extrathoracic metastases were demonstrated.  (8) on exemestane and everolimus September through December  2013  (9) s/p RIGHT thoracentesis 08/01/2012, cytologically positive; s/p temporary Pleurx; effusion resolved after Abraxane therapy  (10) Abraxane started 08/16/2012, given day 1 and day 8 of each 21 day cycle, completed 6 cycles (12 doses) 12/06/2012, with excellent response  (11) fulvestrant started 12/19/2012, stopped 04/11/2013 with progression  (12) trastuzumab resumed 12/21/2011. We scheduled the doses 4 weeks apart to see if they caould be better tolerated. However echo 02/04/2013 again showed a drop in the EF and S', so Herceptin stopped (last dose 01/17/2013). Most recent echo, 04/18/2013, shows the ejection fraction to have recovered.  (13) status post left modified radical mastectomy 05/08/2013 for a pT4, pN2a [tumor extended over 15 cm, all 6 sampled lymph nodes were involved) invasive lobular breast cancer, grade 3, which was HER-2 negative. It was estrogen receptor positive at 72%, progesterone receptor negative. Posterior margin was broadly positive  (14) tamoxifen started 05/29/2013, discontinued December 2014 with progression  (15) post-mastectomy irradiation to left chest wall and left [place] fossa planned, beginning 07/09/2013  (16) new baseline staging scans including a chest CT and PET scan were obtained 07/02/2013 suggesting progression of disease, but that is when compared to previous scans in August 2013. However in December of 2014 there was obvious disease progression on the chest wall  (17) Abraxane resumed 08/07/2013, with treatments day 1 and day 8 of every 21 day cycle, and Neulasta given day 9; with restaging after 2 full cycles showing a very favorable response  (18) zolendronate started 10/02/2048  PLAN:   Latrelle is doing remarkably well with the Abraxane. At present we don't have measurable disease and what the most recent scans showed wide is some new bone lesions which need to be addressed. She has a full upper and lower plates, so I think the possibility of osteonecrosis of the jaw, while not 0, is certainly going to be quite well. We did discuss that today and I started zolendronate today, to be repeated every 6 weeks for the next 6 months, after which we will probably go to every 3 months.  She is scheduled to complete this cycle and then 2 more area today I added a visit with me for April 1 and before the April 1 visit she will have a restaging PET scan. All that has been entered.  Today I refilled her OxyContin, giving her 60 tablets to take one twice a day, and also for oxycodone 5 mg, giving her 120 tablets, to take 1-2 tablets every 4 hours as needed. They are aware of the potential for abuse in this drugs and are not letting even close friends know that she is on this medication since "word gets around" and then people "start breaking into your house".  Keleigh remains anemic. I am to set her up for a transfusion within the next week.  Otherwise she will receive her fourth cycle beginning February 18. Chemotherapy orders are in place. Emera has a good understanding of the overall plan. She agrees with it. She knows a goal of treatment is control. She will call with any problems that may develop before her next visit here.     Chauncey Cruel, MD     10/02/2013

## 2013-10-02 NOTE — Patient Instructions (Signed)

## 2013-10-02 NOTE — Progress Notes (Signed)
Ok to treat with Hgb 8.3 per Dr. Jana Hakim.

## 2013-10-02 NOTE — Telephone Encounter (Signed)
appts made and printed. Pt is aware that tx will be added. i emailed MW to add the tx. Pt is aware that cs will call w/ appt for PET.Marland KitchenMarland KitchenTD

## 2013-10-02 NOTE — Patient Instructions (Addendum)
[facility] Discharge Instructions for Patients Receiving Chemotherapy  Today you received the following chemotherapy agents Abraxane/Zometa To help prevent nausea and vomiting after your treatment, we encourage you to take your nausea medication as prescribed.   If you develop nausea and vomiting that is not controlled by your nausea medication, call the clinic.   BELOW ARE SYMPTOMS THAT SHOULD BE REPORTED IMMEDIATELY:  *FEVER GREATER THAN 100.5 F  *CHILLS WITH OR WITHOUT FEVER  NAUSEA AND VOMITING THAT IS NOT CONTROLLED WITH YOUR NAUSEA MEDICATION  *UNUSUAL SHORTNESS OF BREATH  *UNUSUAL BRUISING OR BLEEDING  TENDERNESS IN MOUTH AND THROAT WITH OR WITHOUT PRESENCE OF ULCERS  *URINARY PROBLEMS  *BOWEL PROBLEMS  UNUSUAL RASH Items with * indicate a potential emergency and should be followed up as soon as possible.  Feel free to call the clinic you have any questions or concerns. The clinic phone number is [phone].   Zoledronic Acid injection (Hypercalcemia, Oncology) What is this medicine? ZOLEDRONIC ACID (ZOE le dron ik AS id) lowers the amount of calcium loss from bone. It is used to treat too much calcium in your blood from cancer. It is also used to prevent complications of cancer that has spread to the bone. This medicine may be used for other purposes; ask your health care provider or pharmacist if you have questions. COMMON BRAND NAME(S): Zometa What should I tell my health care provider before I take this medicine? They need to know if you have any of these conditions: -aspirin-sensitive asthma -cancer, especially if you are receiving medicines used to treat cancer -dental disease or wear dentures -infection -kidney disease -receiving corticosteroids like dexamethasone or prednisone -an unusual or allergic reaction to zoledronic acid, other medicines, foods, dyes, or preservatives -pregnant or trying to get pregnant -breast-feeding How  should I use this medicine? This medicine is for infusion into a vein. It is given by a health care professional in a hospital or clinic setting. Talk to your pediatrician regarding the use of this medicine in children. Special care may be needed. Overdosage: If you think you have taken too much of this medicine contact a poison control center or emergency room at once. NOTE: This medicine is only for you. Do not share this medicine with others. What if I miss a dose? It is important not to miss your dose. Call your doctor or health care professional if you are unable to keep an appointment. What may interact with this medicine? -certain antibiotics given by injection -NSAIDs, medicines for pain and inflammation, like ibuprofen or naproxen -some diuretics like bumetanide, furosemide -teriparatide -thalidomide This list may not describe all possible interactions. Give your health care provider a list of all the medicines, herbs, non-prescription drugs, or dietary supplements you use. Also tell them if you smoke, drink alcohol, or use illegal drugs. Some items may interact with your medicine. What should I watch for while using this medicine? Visit your doctor or health care professional for regular checkups. It may be some time before you see the benefit from this medicine. Do not stop taking your medicine unless your doctor tells you to. Your doctor may order blood tests or other tests to see how you are doing. Women should inform their doctor if they wish to become pregnant or think they might be pregnant. There is a potential for serious side effects to an unborn child. Talk to your health care professional or pharmacist for more information. You should make sure that you get enough  calcium and vitamin D while you are taking this medicine. Discuss the foods you eat and the vitamins you take with your health care professional. Some people who take this medicine have severe bone, joint, and/or  muscle pain. This medicine may also increase your risk for jaw problems or a broken thigh bone. Tell your doctor right away if you have severe pain in your jaw, bones, joints, or muscles. Tell your doctor if you have any pain that does not go away or that gets worse. Tell your dentist and dental surgeon that you are taking this medicine. You should not have major dental surgery while on this medicine. See your dentist to have a dental exam and fix any dental problems before starting this medicine. Take good care of your teeth while on this medicine. Make sure you see your dentist for regular follow-up appointments. What side effects may I notice from receiving this medicine? Side effects that you should report to your doctor or health care professional as soon as possible: -allergic reactions like skin rash, itching or hives, swelling of the face, lips, or tongue -anxiety, confusion, or depression -breathing problems -changes in vision -eye pain -feeling faint or lightheaded, falls -jaw pain, especially after dental work -mouth sores -muscle cramps, stiffness, or weakness -trouble passing urine or change in the amount of urine Side effects that usually do not require medical attention (report to your doctor or health care professional if they continue or are bothersome): -bone, joint, or muscle pain -constipation -diarrhea -fever -hair loss -irritation at site where injected -loss of appetite -nausea, vomiting -stomach upset -trouble sleeping -trouble swallowing -weak or tired This list may not describe all possible side effects. Call your doctor for medical advice about side effects. You may report side effects to FDA at 1-800-FDA-1088. Where should I keep my medicine? This drug is given in a hospital or clinic and will not be stored at home. NOTE: This sheet is a summary. It may not cover all possible information. If you have questions about this medicine, talk to your doctor,  pharmacist, or health care provider.  2014, [employer]. (2013-01-24 13:03:13)

## 2013-10-03 ENCOUNTER — Telehealth: Payer: Self-pay | Admitting: Oncology

## 2013-10-03 ENCOUNTER — Ambulatory Visit: Payer: PRIVATE HEALTH INSURANCE

## 2013-10-03 ENCOUNTER — Ambulatory Visit (HOSPITAL_COMMUNITY)
Admission: RE | Admit: 2013-10-03 | Discharge: 2013-10-03 | Disposition: A | Discharge status: Home / Self Care | Payer: PRIVATE HEALTH INSURANCE | Source: Ambulatory Visit | Attending: Oncology | Admitting: Oncology

## 2013-10-03 ENCOUNTER — Ambulatory Visit (HOSPITAL_BASED_OUTPATIENT_CLINIC_OR_DEPARTMENT_OTHER): Payer: PRIVATE HEALTH INSURANCE

## 2013-10-03 ENCOUNTER — Other Ambulatory Visit: Payer: Self-pay | Admitting: *Deleted

## 2013-10-03 ENCOUNTER — Telehealth: Payer: Self-pay | Admitting: *Deleted

## 2013-10-03 VITALS — BP 147/77 | HR 85 | Temp 98.5°F

## 2013-10-03 DIAGNOSIS — C50411 Malignant neoplasm of upper-outer quadrant of right female breast: Secondary | ICD-10-CM

## 2013-10-03 DIAGNOSIS — Z5189 Encounter for other specified aftercare: Secondary | ICD-10-CM

## 2013-10-03 DIAGNOSIS — C50619 Malignant neoplasm of axillary tail of unspecified female breast: Secondary | ICD-10-CM

## 2013-10-03 DIAGNOSIS — D630 Anemia in neoplastic disease: Secondary | ICD-10-CM

## 2013-10-03 DIAGNOSIS — J910 Malignant pleural effusion: Secondary | ICD-10-CM

## 2013-10-03 DIAGNOSIS — C50412 Malignant neoplasm of upper-outer quadrant of left female breast: Secondary | ICD-10-CM

## 2013-10-03 DIAGNOSIS — C773 Secondary and unspecified malignant neoplasm of axilla and upper limb lymph nodes: Secondary | ICD-10-CM

## 2013-10-03 MED ORDER — PEGFILGRASTIM INJECTION 6 MG/0.6ML
6.0000 mg | Freq: Once | SUBCUTANEOUS | Status: AC
  Administered 2013-10-03: 6 mg via SUBCUTANEOUS
  Filled 2013-10-03: qty 0.6

## 2013-10-03 NOTE — Telephone Encounter (Signed)
appts per 2/4 pof on schedule already and message sent back to chemo scheduler already re inf. message to chemo scheduler re letting me know if blood for 2/9 is going to be ok.

## 2013-10-03 NOTE — Telephone Encounter (Signed)
Per staff message and POF I have tried to schedule appts for Monday. No available for treatment, advised MD. OK per MD to move patient to the sickle cell center. Desk RN to make arrangements.  JMW

## 2013-10-03 NOTE — Telephone Encounter (Signed)
, °

## 2013-10-03 NOTE — Progress Notes (Signed)
Due to no openings on 2/9 for blood transfusion of 2 units - this RN contacted the sickle cell clinic and scheduled transfusion for 10am 2/9. Pt will need to come to this clinic to obtain [place] at [facility].  This RN also called admitting and obtained HAR.  Message left on VM of Michelle per above with request to schedule pt for lab early am 2/9.

## 2013-10-03 NOTE — Patient Instructions (Signed)

## 2013-10-07 ENCOUNTER — Non-Acute Institutional Stay (HOSPITAL_COMMUNITY)
Admission: AD | Admit: 2013-10-07 | Discharge: 2013-10-07 | Disposition: A | Discharge status: Home / Self Care | Payer: PRIVATE HEALTH INSURANCE | Source: Ambulatory Visit | Attending: Oncology | Admitting: Oncology

## 2013-10-07 ENCOUNTER — Ambulatory Visit (HOSPITAL_BASED_OUTPATIENT_CLINIC_OR_DEPARTMENT_OTHER): Payer: PRIVATE HEALTH INSURANCE

## 2013-10-07 ENCOUNTER — Ambulatory Visit: Payer: PRIVATE HEALTH INSURANCE | Admitting: Physical Therapy

## 2013-10-07 ENCOUNTER — Other Ambulatory Visit (HOSPITAL_BASED_OUTPATIENT_CLINIC_OR_DEPARTMENT_OTHER): Payer: PRIVATE HEALTH INSURANCE

## 2013-10-07 VITALS — BP 140/74 | HR 91 | Temp 98.1°F

## 2013-10-07 DIAGNOSIS — D630 Anemia in neoplastic disease: Secondary | ICD-10-CM

## 2013-10-07 DIAGNOSIS — C50619 Malignant neoplasm of axillary tail of unspecified female breast: Secondary | ICD-10-CM

## 2013-10-07 DIAGNOSIS — Z95828 Presence of other vascular implants and grafts: Secondary | ICD-10-CM

## 2013-10-07 DIAGNOSIS — C50919 Malignant neoplasm of unspecified site of unspecified female breast: Secondary | ICD-10-CM

## 2013-10-07 DIAGNOSIS — J90 Pleural effusion, not elsewhere classified: Secondary | ICD-10-CM

## 2013-10-07 DIAGNOSIS — C7951 Secondary malignant neoplasm of bone: Secondary | ICD-10-CM

## 2013-10-07 DIAGNOSIS — C50411 Malignant neoplasm of upper-outer quadrant of right female breast: Secondary | ICD-10-CM

## 2013-10-07 DIAGNOSIS — J910 Malignant pleural effusion: Secondary | ICD-10-CM

## 2013-10-07 DIAGNOSIS — C50412 Malignant neoplasm of upper-outer quadrant of left female breast: Secondary | ICD-10-CM

## 2013-10-07 DIAGNOSIS — C50419 Malignant neoplasm of upper-outer quadrant of unspecified female breast: Secondary | ICD-10-CM | POA: Insufficient documentation

## 2013-10-07 LAB — COMPREHENSIVE METABOLIC PANEL (CC13)
ALBUMIN: 2.8 g/dL — AB (ref 3.5–5.0)
ALT: 22 U/L (ref 0–55)
ANION GAP: 6 meq/L (ref 3–11)
AST: 30 U/L (ref 5–34)
Alkaline Phosphatase: 77 U/L (ref 40–150)
BUN: 11.5 mg/dL (ref 7.0–26.0)
CO2: 29 meq/L (ref 22–29)
Calcium: 7.6 mg/dL — ABNORMAL LOW (ref 8.4–10.4)
Chloride: 100 mEq/L (ref 98–109)
Creatinine: 1 mg/dL (ref 0.6–1.1)
GLUCOSE: 130 mg/dL (ref 70–140)
POTASSIUM: 4.3 meq/L (ref 3.5–5.1)
Sodium: 135 mEq/L — ABNORMAL LOW (ref 136–145)
Total Bilirubin: 0.57 mg/dL (ref 0.20–1.20)
Total Protein: 6.6 g/dL (ref 6.4–8.3)

## 2013-10-07 LAB — CBC WITH DIFFERENTIAL/PLATELET
BASO%: 0.3 % (ref 0.0–2.0)
BASOS ABS: 0 10*3/uL (ref 0.0–0.1)
EOS ABS: 0 10*3/uL (ref 0.0–0.5)
EOS%: 0.4 % (ref 0.0–7.0)
HCT: 26.4 % — ABNORMAL LOW (ref 34.8–46.6)
HEMOGLOBIN: 8.1 g/dL — AB (ref 11.6–15.9)
LYMPH%: 8.9 % — ABNORMAL LOW (ref 14.0–49.7)
MCH: 28.1 pg (ref 25.1–34.0)
MCHC: 30.7 g/dL — ABNORMAL LOW (ref 31.5–36.0)
MCV: 91.7 fL (ref 79.5–101.0)
MONO#: 0.2 10*3/uL (ref 0.1–0.9)
MONO%: 2.1 % (ref 0.0–14.0)
NEUT%: 88.3 % — ABNORMAL HIGH (ref 38.4–76.8)
NEUTROS ABS: 7.1 10*3/uL — AB (ref 1.5–6.5)
PLATELETS: 153 10*3/uL (ref 145–400)
RBC: 2.88 10*6/uL — ABNORMAL LOW (ref 3.70–5.45)
RDW: 14.9 % — AB (ref 11.2–14.5)
WBC: 8 10*3/uL (ref 3.9–10.3)
lymph#: 0.7 10*3/uL — ABNORMAL LOW (ref 0.9–3.3)

## 2013-10-07 LAB — PREPARE RBC (CROSSMATCH)

## 2013-10-07 MED ORDER — SODIUM CHLORIDE 0.9 % IJ SOLN: 3.0000 mL | INTRAMUSCULAR | Status: DC | PRN

## 2013-10-07 MED ORDER — SODIUM CHLORIDE 0.9 % IJ SOLN
10.0000 mL | INTRAMUSCULAR | Status: AC | PRN
  Administered 2013-10-07: 10 mL

## 2013-10-07 MED ORDER — SODIUM CHLORIDE 0.9 % IJ SOLN
10.0000 mL | INTRAMUSCULAR | Status: DC | PRN
  Administered 2013-10-07: 10 mL via INTRAVENOUS
  Filled 2013-10-07: qty 10

## 2013-10-07 MED ORDER — SODIUM CHLORIDE 0.9 % IV SOLN
250.0000 mL | Freq: Once | INTRAVENOUS | Status: AC
  Administered 2013-10-07: 250 mL via INTRAVENOUS

## 2013-10-07 MED ORDER — HEPARIN SOD (PORK) LOCK FLUSH 100 UNIT/ML IV SOLN
500.0000 [IU] | Freq: Once | INTRAVENOUS | Status: AC
  Administered 2013-10-07: 500 [IU] via INTRAVENOUS
  Filled 2013-10-07: qty 5

## 2013-10-07 MED ORDER — ACETAMINOPHEN 325 MG PO TABS
650.0000 mg | ORAL_TABLET | Freq: Once | ORAL | Status: AC
  Administered 2013-10-07: 650 mg via ORAL
  Filled 2013-10-07: qty 2

## 2013-10-07 MED ORDER — HEPARIN SOD (PORK) LOCK FLUSH 100 UNIT/ML IV SOLN
500.0000 [IU] | Freq: Every day | INTRAVENOUS | Status: AC | PRN
  Administered 2013-10-07: 500 [IU]
  Filled 2013-10-07: qty 5

## 2013-10-07 MED ORDER — HEPARIN SOD (PORK) LOCK FLUSH 100 UNIT/ML IV SOLN: 250.0000 [IU] | INTRAVENOUS | Status: DC | PRN

## 2013-10-07 MED ORDER — DIPHENHYDRAMINE HCL 25 MG PO CAPS
25.0000 mg | ORAL_CAPSULE | Freq: Once | ORAL | Status: AC
  Administered 2013-10-07: 25 mg via ORAL
  Filled 2013-10-07: qty 1

## 2013-10-07 NOTE — Patient Instructions (Signed)

## 2013-10-07 NOTE — Procedures (Signed)
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 As per order pt received 2 units of PRBC's. On completion of blood, port was flushed with saline and heparin as per protocol. Port de accessed, site remained dry and intact.  Bandage applied as per pt's request   Condition During Procedure: Pt remained stable throughout the blood transfusions.    Condition at Discharge: Pt stable at discharge, left via wheelchair with husband in no acute distress   Cox, Dawna Part, RN  [facility]

## 2013-10-08 ENCOUNTER — Telehealth: Payer: Self-pay | Admitting: *Deleted

## 2013-10-08 ENCOUNTER — Ambulatory Visit: Payer: PRIVATE HEALTH INSURANCE | Admitting: Physical Therapy

## 2013-10-08 DIAGNOSIS — IMO0001 Reserved for inherently not codable concepts without codable children: Secondary | ICD-10-CM | POA: Diagnosis not present

## 2013-10-08 LAB — TYPE AND SCREEN
ABO/RH(D): A POS
Antibody Screen: NEGATIVE
Unit division: 0
Unit division: 0

## 2013-10-08 NOTE — Telephone Encounter (Signed)
MAY PT. TAKE CHLORASEPTIC LOZENGES FOR HER SORE THROAT? INFORMED PT.'S HUSBAND THAT IT IS OK FOR HIS WIFE TO TAKE THE CHLORASEPTIC LOZENGES. INSTRUCTED PT.'S HUSBAND TO CALL IF HIS WIFE SHOULD  A FEVER OF 100.5 OR HIGHER. HE VOICES UNDERSTANDING.

## 2013-10-10 ENCOUNTER — Ambulatory Visit: Payer: PRIVATE HEALTH INSURANCE

## 2013-10-15 ENCOUNTER — Emergency Department (HOSPITAL_COMMUNITY): Payer: PRIVATE HEALTH INSURANCE

## 2013-10-15 ENCOUNTER — Other Ambulatory Visit: Payer: Self-pay

## 2013-10-15 ENCOUNTER — Emergency Department (HOSPITAL_COMMUNITY)
Admission: EM | Admit: 2013-10-15 | Discharge: 2013-10-16 | Disposition: A | Discharge status: Home / Self Care | Payer: PRIVATE HEALTH INSURANCE | Attending: Emergency Medicine | Admitting: Emergency Medicine

## 2013-10-15 ENCOUNTER — Encounter (HOSPITAL_COMMUNITY): Payer: Self-pay | Admitting: Emergency Medicine

## 2013-10-15 ENCOUNTER — Encounter: Payer: PRIVATE HEALTH INSURANCE | Admitting: Physical Therapy

## 2013-10-15 DIAGNOSIS — Z8611 Personal history of tuberculosis: Secondary | ICD-10-CM | POA: Insufficient documentation

## 2013-10-15 DIAGNOSIS — Z87891 Personal history of nicotine dependence: Secondary | ICD-10-CM | POA: Insufficient documentation

## 2013-10-15 DIAGNOSIS — Z8639 Personal history of other endocrine, nutritional and metabolic disease: Secondary | ICD-10-CM | POA: Insufficient documentation

## 2013-10-15 DIAGNOSIS — M129 Arthropathy, unspecified: Secondary | ICD-10-CM | POA: Insufficient documentation

## 2013-10-15 DIAGNOSIS — I10 Essential (primary) hypertension: Secondary | ICD-10-CM | POA: Insufficient documentation

## 2013-10-15 DIAGNOSIS — Z862 Personal history of diseases of the blood and blood-forming organs and certain disorders involving the immune mechanism: Secondary | ICD-10-CM | POA: Insufficient documentation

## 2013-10-15 DIAGNOSIS — Z85828 Personal history of other malignant neoplasm of skin: Secondary | ICD-10-CM | POA: Insufficient documentation

## 2013-10-15 DIAGNOSIS — G893 Neoplasm related pain (acute) (chronic): Secondary | ICD-10-CM | POA: Insufficient documentation

## 2013-10-15 DIAGNOSIS — Z923 Personal history of irradiation: Secondary | ICD-10-CM | POA: Insufficient documentation

## 2013-10-15 DIAGNOSIS — Z853 Personal history of malignant neoplasm of breast: Secondary | ICD-10-CM | POA: Insufficient documentation

## 2013-10-15 DIAGNOSIS — C799 Secondary malignant neoplasm of unspecified site: Secondary | ICD-10-CM

## 2013-10-15 DIAGNOSIS — R0789 Other chest pain: Secondary | ICD-10-CM

## 2013-10-15 DIAGNOSIS — C50919 Malignant neoplasm of unspecified site of unspecified female breast: Secondary | ICD-10-CM

## 2013-10-15 DIAGNOSIS — Z79899 Other long term (current) drug therapy: Secondary | ICD-10-CM | POA: Insufficient documentation

## 2013-10-15 DIAGNOSIS — R071 Chest pain on breathing: Secondary | ICD-10-CM

## 2013-10-15 DIAGNOSIS — C7951 Secondary malignant neoplasm of bone: Secondary | ICD-10-CM | POA: Insufficient documentation

## 2013-10-15 DIAGNOSIS — C7952 Secondary malignant neoplasm of bone marrow: Secondary | ICD-10-CM

## 2013-10-15 DIAGNOSIS — H9190 Unspecified hearing loss, unspecified ear: Secondary | ICD-10-CM | POA: Insufficient documentation

## 2013-10-15 LAB — CBC WITH DIFFERENTIAL/PLATELET
Basophils Absolute: 0 10*3/uL (ref 0.0–0.1)
Basophils Relative: 0 % (ref 0–1)
Eosinophils Absolute: 0 10*3/uL (ref 0.0–0.7)
Eosinophils Relative: 0 % (ref 0–5)
HCT: 32.7 % — ABNORMAL LOW (ref 36.0–46.0)
Hemoglobin: 10.4 g/dL — ABNORMAL LOW (ref 12.0–15.0)
Lymphocytes Relative: 6 % — ABNORMAL LOW (ref 12–46)
Lymphs Abs: 1.3 10*3/uL (ref 0.7–4.0)
MCH: 29.6 pg (ref 26.0–34.0)
MCHC: 31.8 g/dL (ref 30.0–36.0)
MCV: 93.2 fL (ref 78.0–100.0)
Monocytes Absolute: 1.1 10*3/uL — ABNORMAL HIGH (ref 0.1–1.0)
Monocytes Relative: 5 % (ref 3–12)
Neutro Abs: 19.8 10*3/uL — ABNORMAL HIGH (ref 1.7–7.7)
Neutrophils Relative %: 89 % — ABNORMAL HIGH (ref 43–77)
Platelets: 183 10*3/uL (ref 150–400)
RBC: 3.51 MIL/uL — ABNORMAL LOW (ref 3.87–5.11)
RDW: 16.7 % — ABNORMAL HIGH (ref 11.5–15.5)
WBC: 22.2 10*3/uL — ABNORMAL HIGH (ref 4.0–10.5)

## 2013-10-15 LAB — BASIC METABOLIC PANEL
BUN: 9 mg/dL (ref 6–23)
CO2: 26 mEq/L (ref 19–32)
Calcium: 7.4 mg/dL — ABNORMAL LOW (ref 8.4–10.5)
Chloride: 96 mEq/L (ref 96–112)
Creatinine, Ser: 0.89 mg/dL (ref 0.50–1.10)
GFR calc Af Amer: 75 mL/min — ABNORMAL LOW (ref >90)
GFR calc non Af Amer: 65 mL/min — ABNORMAL LOW (ref >90)
Glucose, Bld: 118 mg/dL — ABNORMAL HIGH (ref 70–99)
Potassium: 5.3 mEq/L (ref 3.7–5.3)
Sodium: 131 mEq/L — ABNORMAL LOW (ref 137–147)

## 2013-10-15 LAB — TROPONIN I: Troponin I: <0.3 ng/mL (ref <0.30)

## 2013-10-15 MED ORDER — HYDROMORPHONE HCL PF 1 MG/ML IJ SOLN
1.0000 mg | Freq: Once | INTRAMUSCULAR | Status: AC
Start: 2013-10-15 — End: 2013-10-15
  Administered 2013-10-15: 1 mg via INTRAVENOUS
  Filled 2013-10-15: qty 1

## 2013-10-15 MED ORDER — ONDANSETRON HCL 4 MG/2ML IJ SOLN
4.0000 mg | Freq: Once | INTRAMUSCULAR | Status: AC
  Administered 2013-10-15: 4 mg via INTRAVENOUS
  Filled 2013-10-15: qty 2

## 2013-10-15 MED ORDER — LORAZEPAM 2 MG/ML IJ SOLN
0.5000 mg | Freq: Once | INTRAMUSCULAR | Status: AC
  Administered 2013-10-15: 0.5 mg via INTRAVENOUS
  Filled 2013-10-15: qty 1

## 2013-10-15 MED ORDER — KETOROLAC TROMETHAMINE 15 MG/ML IJ SOLN
15.0000 mg | Freq: Once | INTRAMUSCULAR | Status: AC
  Administered 2013-10-15: 15 mg via INTRAVENOUS
  Filled 2013-10-15: qty 1

## 2013-10-15 MED ORDER — KETAMINE HCL 10 MG/ML IJ SOLN
15.0000 mg | Freq: Once | INTRAMUSCULAR | Status: AC
  Administered 2013-10-15: 15 mg via INTRAVENOUS
  Filled 2013-10-15: qty 1.5

## 2013-10-15 NOTE — ED Notes (Addendum)
Per ems pt needs pain control. Pt currently has breast cancer and met to bone. Pt on oxycodone and oxycotin, pt took pain medications at 1315.  Pt requesting home health care for pain management. Pain 10/10.

## 2013-10-15 NOTE — ED Provider Notes (Signed)
CSN: [id number]     Arrival date & time 10/15/13  1432 History   First MD Initiated Contact with Patient 10/15/13 1501     Chief Complaint  Patient presents with  . cancer pt, pain control      (Consider location/radiation/quality/duration/timing/severity/associated sxs/prior Treatment) HPI  68 year old female with left chest pain. Patient has a past history of metastatic breast cancer. She is status post mastectomy. Metastases to bone including her spine in bilateral ribs. She's been having ongoing pain in this area for quite some time. She reports that his has become steadily worse over the past several days ago. She has been taking MS Contin and oxycodone for breakthrough pain with minimal relief. No fevers or chills. No cough. No acute shortness of breath. No dizziness or lightheadedness. No nausea or diaphoresis.   Past Medical History  Diagnosis Date  . Hypertension   . Hearing loss     right ear  . Hyperlipidemia   . Arthritis   . Dysrhythmia     A fib with IV chemo treatments  . Shortness of breath     with exertion  . 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 Breast cancer     (Rt) breast ca dx 9/11  . 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 Neuropathy   . History of radiation therapy 01/18/11-03/07/11    5940 cGy to Right chest wall, axilla and supraclavicular region  . Lymphedema of arm 08/28/2013  . Tuberculosis     15 yrs ago- treated for 1 yeat   Past Surgical History  Procedure Laterality Date  . Tubal ligation    . Portacath placement Left   . Abdominal hysterectomy    . Lung biopsy    . Lesion excision      L anterior thigh  . Breast surgery  12/01/10    ER+,PR+,Ki 67 64%,Her2 -; right breast lumpectomy  . Chest tube insertion  08/08/2012    Procedure: INSERTION PLEURAL DRAINAGE CATHETER;  Surgeon: Gaye Pollack, MD;  Location: [facility];  Service: Thoracic;  Laterality: Right;  . Removal of pleural drainage catheter Right 10/10/2012    Procedure: REMOVAL OF PLEURAL  DRAINAGE CATHETER;  Surgeon: Gaye Pollack, MD;  Location: [facility];  Service: Thoracic;  Laterality: Right;  . Mastectomy, radical Left 05/08/2013    Dr Marlou Starks  . Mastectomy modified radical Left 05/08/2013    Procedure: MASTECTOMY MODIFIED RADICAL;  Surgeon: Merrie Roof, MD;  Location: [facility];  Service: General;  Laterality: Left;   History reviewed. No pertinent family history. History  Substance Use Topics  . Smoking status: Former Smoker    Quit date: 06/30/1973  . Smokeless tobacco: Never Used  . Alcohol Use: No   OB History   Grav Para Term Preterm Abortions TAB SAB Ect Mult Living                 Review of Systems  All systems reviewed and negative, other than as noted in HPI.   Allergies  Review of patient's allergies indicates no known allergies.  Home Medications   Current Outpatient Rx  Name  Route  Sig  Dispense  Refill  . Calcium Carbonate-Vitamin D 600-400 MG-UNIT per tablet   Oral   Take 1 tablet by mouth every morning.          . carvedilol (COREG) 6.25 MG tablet   Oral   Take 1 tablet (6.25 mg total) by mouth  2 (two) times daily with a meal. She takes with a 12.5 mg tablet to equal her dose of 18.75 mg.   60 tablet   6   . cyanocobalamin 500 MCG tablet   Oral   Take 500 mcg by mouth daily with breakfast.          . dronabinol (MARINOL) 2.5 MG capsule   Oral   Take 1 capsule (2.5 mg total) by mouth 2 (two) times daily before lunch and supper.   15 capsule   0   . ferrous sulfate 325 (65 FE) MG tablet   Oral   Take 1 tablet (325 mg total) by mouth daily with breakfast.   30 tablet   1   . gabapentin (NEURONTIN) 300 MG capsule   Oral   Take 300 mg by mouth at bedtime.          . metoCLOPramide (REGLAN) 5 MG tablet   Oral   Take 1 tablet (5 mg total) by mouth 3 (three) times daily before meals.   20 tablet   0   . ondansetron (ZOFRAN) 8 MG tablet               . oxyCODONE (OXY IR/ROXICODONE) 5 MG immediate release tablet    Oral   Take 1 tablet (5 mg total) by mouth every 4 (four) hours as needed for severe pain.   120 tablet   0   . OxyCODONE (OXYCONTIN) 40 mg T12A 12 hr tablet   Oral   Take 1 tablet (40 mg total) by mouth every 12 (twelve) hours.   60 tablet   0   . polyethylene glycol (MIRALAX / GLYCOLAX) packet   Oral   Take 17 g by mouth daily.   14 each   0   . potassium chloride SA (K-DUR,KLOR-CON) 20 MEQ tablet   Oral   Take 20 mEq by mouth daily.          . prochlorperazine (COMPAZINE) 10 MG tablet               . protein supplement (RESOURCE BENEPROTEIN) 6 g POWD   Oral   Take 1 scoop (6 g total) by mouth 2 (two) times daily with a meal.   15 Can   0    BP 135/64  Pulse 80  Temp(Src) 98.2 F (36.8 C) (Oral)  Resp 10  SpO2 99% Physical Exam  Nursing note and vitals reviewed. Constitutional: She appears well-developed and well-nourished. No distress.  HENT:  Head: Normocephalic and atraumatic.  Eyes: Conjunctivae are normal. Right eye exhibits no discharge. Left eye exhibits no discharge.  Neck: Neck supple.  Cardiovascular: Normal rate, regular rhythm and normal heart sounds.  Exam reveals no gallop and no friction rub.   No murmur heard. Pulmonary/Chest: Effort normal and breath sounds normal. No respiratory distress. She exhibits tenderness.  Tenderness to the anterior L chest wall. Postsurgical changes noted. No signs of acute skin changes. Port left chest.  Abdominal: Soft. She exhibits no distension. There is no tenderness.  Musculoskeletal: She exhibits no edema and no tenderness.  B/l UE lymphedema  Neurological: She is alert.  Skin: Skin is warm and dry.  Psychiatric: She has a normal mood and affect. Her behavior is normal. Thought content normal.    ED Course  Procedures (including critical care time) Labs Review Labs Reviewed  CBC WITH DIFFERENTIAL - Abnormal; Notable for the following:    WBC 22.2 (*)    RBC  3.51 (*)    Hemoglobin 10.4 (*)    HCT  32.7 (*)    RDW 16.7 (*)    Neutrophils Relative % 89 (*)    Neutro Abs 19.8 (*)    Lymphocytes Relative 6 (*)    Monocytes Absolute 1.1 (*)    All other components within normal limits  BASIC METABOLIC PANEL - Abnormal; Notable for the following:    Sodium 131 (*)    Glucose, Bld 118 (*)    Calcium 7.4 (*)    GFR calc non Af Amer 65 (*)    GFR calc Af Amer 75 (*)    All other components within normal limits  TROPONIN I   Imaging Review Dg Chest Portable 1 View  10/15/2013   CLINICAL DATA:  Left side chest pain  EXAM: PORTABLE CHEST - 1 VIEW  COMPARISON:  09/17/2013  FINDINGS: Cardiomegaly again noted. Degenerative changes thoracic spine. Surgical clips are noted in right axilla. No acute infiltrate or pulmonary edema. Stable old right lower rib fractures. Stable position of left subclavian central line.  IMPRESSION: No active disease.  Cardiomegaly again noted.   Electronically Signed   By: Lahoma Crocker M.D.   On: 10/15/2013 15:51    EKG Interpretation   None      EKG:  Rhythm: normal sinus Vent. rate 80 BPM PR interval 140 ms QRS duration 76 ms QT/QTc 378/436 ms Low voltage ST segments: ns st changes   MDM   Final diagnoses:  Chest wall pain  Metastatic cancer    68 year old female with left chest pain. I suspect that this is musculoskeletal in etiology. It is atypical for ACS. Troponin is normal. EKG without any concerning acute findings. Chest x-ray without any acute abnormality. Pain is very reproducible. Leukocytosis of unclear etiology. Afebrile. No new cough or acute respiratory complaints. Does not appear to be infectious process.  Patient reports ongoing pain, although improved with treatment in the emergency room. Patient is very hesitant to go home, but not completely forthcoming as to why. Discussed that could change/add pain medications but adamant that be admitted. Initially discuss with Dr Clementeen Graham, hospitalist,  who graciously evaluated her. Unfortunately  few beds left in hospital. Further discussion with pt. Will keep in ED over night. Has appointment with oncology in am. Will discharge in morning for this appointment. Continued monitoring and medication as needed.   Virgel Manifold, MD 10/16/13 [phone]

## 2013-10-15 NOTE — Consult Note (Addendum)
Requesting physician: Dr Wilson Singer  Reason for consultation: Pain control  Primary oncologist: Dr Jana Hakim  History of Present Illness: 68 year old female with history of hypertension, hyperlipidemia, case for breast CA with invasive ductal carcinoma status post left modified radical mastectomy in 9/ 2014 and a right mastectomy in 11/2010 to currently on chemotherapy presented to the ED with a one week cough progressive pain over her left breast area. She reports the pain to be dull aching but severe and nonradiating. Patient at home is on OxyContin 40 mg twice daily (which was increased by Dr. Jana Hakim last week), and oxycodone 5 mg every 4 hours as needed but did not improve her pain symptoms. She denies any fever, chills, cough, shortness of breath, headache, blurred vision, dizziness, abdominal pain, joint pains, urinary symptoms. She does have constipation.  Course in the ED Patient vitals were stable. Blood work done showed a leukocytosis with WBC of 22,000, albumin of 10.4, hematocrit 32.7 History short mild hyponatremia, potassium of 5.3 and calcium of 7.4. Initial troponin was negative. EKG was unremarkable. Chest x-ray was unremarkable as well. Patient given total of 3 mg of IV dilaudid, 15 mg of IV ketamine and  15 mg of IV toradol. And her pain did improve to 6/10. 5 hospitalist was consulted for admission and observation overnight.  Allergies:  No Known Allergies    Past Medical History  Diagnosis Date  . Hypertension   . Hearing loss     right ear  . Hyperlipidemia   . Arthritis   . Dysrhythmia     A fib with IV chemo treatments  . Shortness of breath     with exertion  . 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 Breast cancer     (Rt) breast ca dx 9/11  . 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 Neuropathy   . History of radiation therapy 01/18/11-03/07/11    5940 cGy to Right chest wall, axilla and supraclavicular region  . Lymphedema of arm 08/28/2013  . Tuberculosis     15  yrs ago- treated for 1 yeat    Past Surgical History  Procedure Laterality Date  . Tubal ligation    . Portacath placement Left   . Abdominal hysterectomy    . Lung biopsy    . Lesion excision      L anterior thigh  . Breast surgery  12/01/10    ER+,PR+,Ki 67 64%,Her2 -; right breast lumpectomy  . Chest tube insertion  08/08/2012    Procedure: INSERTION PLEURAL DRAINAGE CATHETER;  Surgeon: Gaye Pollack, MD;  Location: [facility];  Service: Thoracic;  Laterality: Right;  . Removal of pleural drainage catheter Right 10/10/2012    Procedure: REMOVAL OF PLEURAL DRAINAGE CATHETER;  Surgeon: Gaye Pollack, MD;  Location: [facility];  Service: Thoracic;  Laterality: Right;  . Mastectomy, radical Left 05/08/2013    Dr Marlou Starks  . Mastectomy modified radical Left 05/08/2013    Procedure: MASTECTOMY MODIFIED RADICAL;  Surgeon: Merrie Roof, MD;  Location: [facility];  Service: General;  Laterality: Left;    Medications: Calcium carbonate-vitamin B 600-400 mg one tablet every morning coreg 6.25 mg one tablet 2 times daily. Cyanocobalamin 500 mg by mouth daily marinol 2.5 mg 2 tabs daily Ferrous sulfate 325 mg by mouth daily Neurontin 300 mg by mouth at bedtime Reglan 5 mg by mouth 3 times a day Oxycodone IR 5 mg by mouth every 4 hours as needed for pain  OxyContin 40 mg every 12 hours MiraLax 17 g every other day kcl  95mq po daily Protein supplements 2 times daily  Social History:  reports that she quit smoking about 40 years ago. She has never used smokeless tobacco. She reports that she does not drink alcohol or use illicit drugs.  History reviewed. No pertinent family history.  Review of Systems:  Constitutional: Denies fever, chills, diaphoresis, appetite change fatigue.  HEENT: Denies photophobia, eye pain, redness, hearing loss, ear pain, congestion, sore throat, rhinorrhea, sneezing, mouth sores, trouble swallowing, neck pain, neck stiffness and tinnitus.   Respiratory: left Sided breast  pain, Denies SOB, DOE, cough, chest tightness,  and wheezing.   Cardiovascular: Denies chest pain, palpitations and leg swelling.  Gastrointestinal: Denies nausea, vomiting, abdominal pain, diarrhea, , blood in stool and abdominal distention.  reports constipation Genitourinary: Denies dysuria, urgency, frequency, hematuria, flank pain and difficulty urinating.  Endocrine: Denies hot or cold intolerance,polyuria, polydipsia. Musculoskeletal: Denies myalgias, back pain, joint swelling, arthralgias and gait problem.  Skin: Denies pallor, rash and wound.  Neurological: Denies dizziness, seizures, syncope, weakness, light-headedness, numbness and headaches.  Psychiatric/Behavioral: Denies  confusion, nervousness, sleep disturbance and agitation   Physical Exam:  Filed Vitals:   10/15/13 1439 10/15/13 1801 10/15/13 1900  BP: 135/64 146/62 145/62  Pulse: 80 78 98  Temp: 98.2 F (36.8 C)    TempSrc: Oral    Resp: _0 SpO2: 99% 93% 100%    No intake or output data in the 24 hours ending 10/15/13 2215  General: Elderly obese female lying in bed in no acute distress HEENT: No pallor, no icterus, moist oral mucosa Heart: Regular rate and rhythm, without murmurs, rubs, gallops. Lungs: Clear to auscultation bilaterally. Left-sided Port-A-Cath, tender to palpation over left lower breast Abdomen: Soft, nontender, nondistended, positive bowel sounds. Extremities: No clubbing cyanosis or edema with positive pedal pulses. Neuro: AAO x3  Labs on Admission:  CBC:    Component Value Date/Time   WBC 22.2* 10/15/2013 1917   WBC 8.0 10/07/2013 0815   HGB 10.4* 10/15/2013 1917   HGB 8.1* 10/07/2013 0815   HCT 32.7* 10/15/2013 1917   HCT 26.4* 10/07/2013 0815   PLT 183 10/15/2013 1917   PLT 153 10/07/2013 0815   MCV 93.2 10/15/2013 1917   MCV 91.7 10/07/2013 0815   NEUTROABS 19.8* 10/15/2013 1917   NEUTROABS 7.1* 10/07/2013 0815   LYMPHSABS 1.3 10/15/2013 1917   LYMPHSABS 0.7* 10/07/2013 0815   MONOABS  1.1* 10/15/2013 1917   MONOABS 0.2 10/07/2013 0815   EOSABS 0.0 10/15/2013 1917   EOSABS 0.0 10/07/2013 0815   BASOSABS 0.0 10/15/2013 1917   BASOSABS 0.0 10/07/2013 08588   Basic Metabolic Panel:    Component Value Date/Time   NA 131* 10/15/2013 1917   NA 135* 10/07/2013 0816   K 5.3 10/15/2013 1917   K 4.3 10/07/2013 0816   CL 96 10/15/2013 1917   CL 106 02/14/2013 1048   CO2 26 10/15/2013 1917   CO2 29 10/07/2013 0816   BUN 9 10/15/2013 1917   BUN 11.5 10/07/2013 0816   CREATININE 0.89 10/15/2013 1917   CREATININE 1.0 10/07/2013 0816   GLUCOSE 118* 10/15/2013 1917   GLUCOSE 130 10/07/2013 0816   GLUCOSE 109* 02/14/2013 1048   CALCIUM 7.4* 10/15/2013 1917   CALCIUM 7.6* 10/07/2013 0816    Radiological Exams on Admission: Dg Chest Portable 1 View  10/15/2013   CLINICAL DATA:  Left side chest pain  EXAM:  PORTABLE CHEST - 1 VIEW  COMPARISON:  09/17/2013  FINDINGS: Cardiomegaly again noted. Degenerative changes thoracic spine. Surgical clips are noted in right axilla. No acute infiltrate or pulmonary edema. Stable old right lower rib fractures. Stable position of left subclavian central line.  IMPRESSION: No active disease.  Cardiomegaly again noted.   Electronically Signed   By: Lahoma Crocker M.D.   On: 10/15/2013 15:51    Assessment/Plan Persistent pain over her left breast Musculoskeletal in etiology secondary to her underlying malignancy. EKG and troponin unremarkable. She has reproducible pain and did improve to an extent after pain medications received in the ED. -Unfortunately at this time we do not have any medical bed in the hospital and I explained the patient and her daughter about this. Patient has chemotherapy scheduled for tomorrow. She does not wish to be transferred to [facility] and since her pain has been improving she would like to home. I discussed with the ED physician who plans to monitor her overnight in the ED and discharged her to the [facility] in the morning to have her scheduled  chemotherapy.  Leukocytosis Unexplained. No underlying signs of infection or possibly reactive. We can repeat a CBC in the morning.  HTN stable  Patient will be discharged form ED in am to cancer center for her chemotherapy    Time Spent on consult 50 minutes  Anayla Giannetti 10/15/2013, 10:15 PM

## 2013-10-15 NOTE — ED Notes (Signed)
Bed: WA17 Expected date:  Expected time:  Means of arrival:  Comments: ems- cancer pt, pain control

## 2013-10-16 ENCOUNTER — Ambulatory Visit (HOSPITAL_BASED_OUTPATIENT_CLINIC_OR_DEPARTMENT_OTHER): Payer: PRIVATE HEALTH INSURANCE | Admitting: Hematology and Oncology

## 2013-10-16 ENCOUNTER — Telehealth: Payer: Self-pay | Admitting: Oncology

## 2013-10-16 ENCOUNTER — Ambulatory Visit (HOSPITAL_BASED_OUTPATIENT_CLINIC_OR_DEPARTMENT_OTHER): Payer: PRIVATE HEALTH INSURANCE

## 2013-10-16 ENCOUNTER — Ambulatory Visit: Payer: PRIVATE HEALTH INSURANCE

## 2013-10-16 ENCOUNTER — Other Ambulatory Visit (HOSPITAL_BASED_OUTPATIENT_CLINIC_OR_DEPARTMENT_OTHER): Payer: PRIVATE HEALTH INSURANCE

## 2013-10-16 VITALS — BP 151/96 | HR 89 | Temp 98.9°F | Resp 20 | Ht 64.0 in | Wt 214.0 lb

## 2013-10-16 DIAGNOSIS — C773 Secondary and unspecified malignant neoplasm of axilla and upper limb lymph nodes: Secondary | ICD-10-CM

## 2013-10-16 DIAGNOSIS — C50619 Malignant neoplasm of axillary tail of unspecified female breast: Secondary | ICD-10-CM

## 2013-10-16 DIAGNOSIS — C50919 Malignant neoplasm of unspecified site of unspecified female breast: Secondary | ICD-10-CM

## 2013-10-16 DIAGNOSIS — R630 Anorexia: Secondary | ICD-10-CM

## 2013-10-16 DIAGNOSIS — C50412 Malignant neoplasm of upper-outer quadrant of left female breast: Secondary | ICD-10-CM

## 2013-10-16 DIAGNOSIS — C7952 Secondary malignant neoplasm of bone marrow: Secondary | ICD-10-CM

## 2013-10-16 DIAGNOSIS — C50411 Malignant neoplasm of upper-outer quadrant of right female breast: Secondary | ICD-10-CM

## 2013-10-16 DIAGNOSIS — J910 Malignant pleural effusion: Secondary | ICD-10-CM

## 2013-10-16 DIAGNOSIS — R079 Chest pain, unspecified: Secondary | ICD-10-CM

## 2013-10-16 DIAGNOSIS — Z95828 Presence of other vascular implants and grafts: Secondary | ICD-10-CM

## 2013-10-16 DIAGNOSIS — C7951 Secondary malignant neoplasm of bone: Secondary | ICD-10-CM

## 2013-10-16 DIAGNOSIS — J90 Pleural effusion, not elsewhere classified: Secondary | ICD-10-CM

## 2013-10-16 DIAGNOSIS — M549 Dorsalgia, unspecified: Secondary | ICD-10-CM

## 2013-10-16 DIAGNOSIS — R1011 Right upper quadrant pain: Secondary | ICD-10-CM

## 2013-10-16 DIAGNOSIS — Z5111 Encounter for antineoplastic chemotherapy: Secondary | ICD-10-CM

## 2013-10-16 LAB — CBC WITH DIFFERENTIAL/PLATELET
BASO%: 0.1 % (ref 0.0–2.0)
Basophils Absolute: 0 10*3/uL (ref 0.0–0.1)
EOS%: 0.1 % (ref 0.0–7.0)
Eosinophils Absolute: 0 10*3/uL (ref 0.0–0.5)
HCT: 33.3 % — ABNORMAL LOW (ref 34.8–46.6)
HGB: 10.6 g/dL — ABNORMAL LOW (ref 11.6–15.9)
LYMPH#: 0.9 10*3/uL (ref 0.9–3.3)
LYMPH%: 5.3 % — ABNORMAL LOW (ref 14.0–49.7)
MCH: 29.1 pg (ref 25.1–34.0)
MCHC: 31.8 g/dL (ref 31.5–36.0)
MCV: 91.6 fL (ref 79.5–101.0)
MONO#: 0.8 10*3/uL (ref 0.1–0.9)
MONO%: 5.1 % (ref 0.0–14.0)
NEUT#: 14.7 10*3/uL — ABNORMAL HIGH (ref 1.5–6.5)
NEUT%: 89.4 % — ABNORMAL HIGH (ref 38.4–76.8)
Platelets: 198 10*3/uL (ref 145–400)
RBC: 3.63 10*6/uL — ABNORMAL LOW (ref 3.70–5.45)
RDW: 17 % — AB (ref 11.2–14.5)
WBC: 16.4 10*3/uL — AB (ref 3.9–10.3)

## 2013-10-16 LAB — COMPREHENSIVE METABOLIC PANEL (CC13)
ALBUMIN: 2.7 g/dL — AB (ref 3.5–5.0)
ALT: 14 U/L (ref 0–55)
AST: 30 U/L (ref 5–34)
Alkaline Phosphatase: 92 U/L (ref 40–150)
Anion Gap: 7 mEq/L (ref 3–11)
BUN: 9.6 mg/dL (ref 7.0–26.0)
CHLORIDE: 101 meq/L (ref 98–109)
CO2: 25 mEq/L (ref 22–29)
CREATININE: 0.9 mg/dL (ref 0.6–1.1)
Calcium: 8 mg/dL — ABNORMAL LOW (ref 8.4–10.4)
Glucose: 118 mg/dl (ref 70–140)
POTASSIUM: 5.1 meq/L (ref 3.5–5.1)
SODIUM: 134 meq/L — AB (ref 136–145)
TOTAL PROTEIN: 6.5 g/dL (ref 6.4–8.3)
Total Bilirubin: 0.55 mg/dL (ref 0.20–1.20)

## 2013-10-16 MED ORDER — HEPARIN SOD (PORK) LOCK FLUSH 100 UNIT/ML IV SOLN
500.0000 [IU] | Freq: Once | INTRAVENOUS | Status: AC
  Administered 2013-10-16: 500 [IU]
  Filled 2013-10-16: qty 5

## 2013-10-16 MED ORDER — OXYCODONE HCL 5 MG PO TABS
5.0000 mg | ORAL_TABLET | Freq: Once | ORAL | Status: AC
  Administered 2013-10-16: 5 mg via ORAL
  Filled 2013-10-16: qty 1

## 2013-10-16 MED ORDER — HEPARIN SOD (PORK) LOCK FLUSH 100 UNIT/ML IV SOLN
500.0000 [IU] | Freq: Once | INTRAVENOUS | Status: AC | PRN
  Administered 2013-10-16: 500 [IU]
  Filled 2013-10-16: qty 5

## 2013-10-16 MED ORDER — SODIUM CHLORIDE 0.9 % IV SOLN
Freq: Once | INTRAVENOUS | Status: AC
  Administered 2013-10-16: 14:00:00 via INTRAVENOUS

## 2013-10-16 MED ORDER — PACLITAXEL PROTEIN-BOUND CHEMO INJECTION 100 MG
100.0000 mg/m2 | Freq: Once | INTRAVENOUS | Status: AC
  Administered 2013-10-16: 225 mg via INTRAVENOUS
  Filled 2013-10-16: qty 45

## 2013-10-16 MED ORDER — SODIUM CHLORIDE 0.9 % IJ SOLN
10.0000 mL | INTRAMUSCULAR | Status: DC | PRN
Start: 2013-10-16 — End: 2013-10-16
  Administered 2013-10-16: 10 mL via INTRAVENOUS
  Filled 2013-10-16: qty 10

## 2013-10-16 MED ORDER — ONDANSETRON 8 MG/50ML IVPB (CHCC)
8.0000 mg | Freq: Once | INTRAVENOUS | Status: AC
  Administered 2013-10-16: 8 mg via INTRAVENOUS

## 2013-10-16 MED ORDER — DEXAMETHASONE SODIUM PHOSPHATE 10 MG/ML IJ SOLN
INTRAMUSCULAR | Status: AC
  Filled 2013-10-16: qty 1

## 2013-10-16 MED ORDER — SODIUM CHLORIDE 0.9 % IJ SOLN
10.0000 mL | INTRAMUSCULAR | Status: DC | PRN
  Administered 2013-10-16: 10 mL
  Filled 2013-10-16: qty 10

## 2013-10-16 MED ORDER — ONDANSETRON 8 MG/NS 50 ML IVPB
INTRAVENOUS | Status: AC
  Filled 2013-10-16: qty 8

## 2013-10-16 MED ORDER — DEXAMETHASONE SODIUM PHOSPHATE 10 MG/ML IJ SOLN
10.0000 mg | Freq: Once | INTRAMUSCULAR | Status: AC
  Administered 2013-10-16: 10 mg via INTRAVENOUS

## 2013-10-16 MED ORDER — HEPARIN SOD (PORK) LOCK FLUSH 100 UNIT/ML IV SOLN
500.0000 [IU] | Freq: Once | INTRAVENOUS | Status: AC
  Administered 2013-10-16: 500 [IU] via INTRAVENOUS
  Filled 2013-10-16: qty 5

## 2013-10-16 MED ORDER — HYDROMORPHONE HCL PF 1 MG/ML IJ SOLN
1.0000 mg | Freq: Once | INTRAMUSCULAR | Status: AC
Start: 2013-10-16 — End: 2013-10-16
  Administered 2013-10-16: 1 mg via INTRAVENOUS
  Filled 2013-10-16: qty 1

## 2013-10-16 NOTE — Patient Instructions (Signed)

## 2013-10-16 NOTE — Telephone Encounter (Signed)
pt will f/u as scheduled 2/25. no new orders per 2/18 pof.

## 2013-10-16 NOTE — Discharge Instructions (Signed)
Bone Metastases °Cancerous growths can begin in any part of the body. The original site of cancer is called the primary tumor or primary cancer (for example, breast cancer). After cancer has developed in one area of the body, cancerous cells from that area can break away and travel through the body's bloodstream. If these cancerous cells begin growing in another place in the body, they are called metastases. Bone metastases are cancer cells that have spread to the bone (which is different from a cancer that starts in the bone). °These secondary growths are like the original tumor. For example, if a prostate cancer spreads to bone it is called metastatic prostate cancer, or prostate cancer metastatic to bone, but not bone cancer. Cancers can spread to almost any bone; the spine and pelvis are often involved.  °Any type of cancer can spread to the bone, but the most common are breast, lung, kidney, thyroid and prostate cancers. Sometimes the primary tumor is not discovered until there are bone problems. If the primary cancer location cannot be discovered, the cancer is called cancer of unknown primary location. °SYMPTOMS  °Pain in the bones is the main symptom of bone metastases. Some other problems may occur first including: °· Decreased appetite. °· Nausea. °· Muscle weakness. °· Confusion. °· Unusual sleep patterns due to discomfort. °· Overly tired (fatigue). °· Restlessness. °Frail or brittle bones may lead to broken bones (fractures) that lead to learning what is wrong (diagnosis). A tumor often weakens the bones.  °DIAGNOSIS  °Metastatic cancers may be found months or years after or at the same time as the primary tumor. When a second tumor is found in a patient who has been treated for cancer, it is more often a metastasis than another primary tumor.  °The patient's symptoms, physical examination, X-rays and blood tests may suggest a bone metastases. In addition, an examination of tissue or a cell sample  (biopsy) is usually done to find the cancer. This sample is removed with a needle. This tissue sample must be looked at under a microscope to confirm a diagnosis. °TREATMENT  °Options generally include treatments that give relief from symptoms (palliative) or curative. Those with advanced, metastasized cancer may receive treatment focused on pain relief and prolonging life. These treatments depend on the type of cancer and its location.  °Treatment for cancer depends on its type and location. Some of these treatments are: °· Surgery to remove the original tumor and/or to remove parts of the body that produce hormones and other chemicals that make cancer worse. °· Treatment with drugs (chemotherapy). °· Bone marrow transplantations on rare occasions. °· Radiation therapy (radiotherapy). °· Hormonal therapy. °· Pain relieving medications. °Your caregiver will help you understand the likelihood that any particular treatment will be helpful for you. While some treatments aim to cure or control the cancer, others give relief from symptoms only. If you have bone metastases, radiation therapy may be recommended to treat pain (if it is in one main location). Pain medications are available. These include strong medicines like morphine. You may be instructed to take a long-acting pain medication (to control most of your pain) and a short-acting medication to control occasional flares of pain. Pain medication is sometimes also given continuously through a pump. °HOME CARE INSTRUCTIONS  °· Take medications exactly as prescribed. °· Keep any follow-up appointments. °· Pain medications can make you sleepy or confused. Do not drive, climb ladders, or do other dangerous activities while on pain medication. °· Pain medications often   cause constipation. Ask your caregiver for information on stool softeners. °· Do not share your pain medication with others. °SEEK MEDICAL CARE IF:  °· Your bone pain is not controlled. °· You are having  problems or side effects from your medication. °· You have excessive sleepiness or confusion. °SEEK IMMEDIATE MEDICAL CARE IF:  °· You fall and have any injury or pain from the fall. °· You have trouble walking. °· You have numbness or tingling in your legs. °· You develop a sudden significant worsening of your pain. °Document Released: 08/05/2002 Document Revised: 11/07/2011 Document Reviewed: 03/28/2008 °[employer]® Patient Information ©2014 [employer]. ° °

## 2013-10-16 NOTE — Progress Notes (Signed)
ID: Jeanette Morris   DOB: 10-31-1945  MR#: [id number]  EHU#:[id number]  PCP: [name], NP GYN:  SU: [facility] OTHER MD: Glori Bickers, Alene Mires  CHIEF COMPLAINT: For initiation of cycle 4 day 1 chemotherapy with Abraxane  DIAGNOSIS:  Metastatic Breast Cancer   HISTORY OF PRESENT ILLNESS: As per the previously documented note: The patient undergoes annual screening mammography.  Her mammograms previously had fullness.  In August 2011 she presented with some firmness of her right breast. She also noted some skin thickening around her nipple and some tenderness.  She was subsequently seen for this on 05/13/2010 and was referred for bilateral mammogram and right breast ultrasound.  There was concern that this may be mastitis.  There were diffuse skin thickening, tissue edema, enlarged lymph node in right axilla, some subareolar distortion secondary to the previous surgery.  She was given a course of doxycycline for 10 days and asked to return. A follow-up ultrasound of the breast showed persistent abnormalities.  She was referred for biopsy.  She underwent biopsy of the right axilla on 05/27/2010 and it showed metastatic carcinoma consistent with breast primary.  This was ER and PR positive, HER-2 was positive with a ratio of 3.09.  The ER ratio is 34%, PR was 64%, proliferative index 60%.  She subsequently had a MRI scan of both breasts on 06/03/2010 and this showed a area of minimal enhancement in the right breast measuring 10.0 x 6.0 x 3.0 cm.  Enlarged lymph nodes were seen in the right axilla.  Largest node measuring 3.0 x 1.0 x 7.0 cm.  Intramammary lymph nodes were also seen in the outer left breast, the largest measuring 2.8 x 2.5 cm.  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   The patient's subsequent history is as detailed below.  INTERVAL HISTORY: [name] returns today accompanied by her husband  Jeanette Morris for initiation of cycle 4 day 1 chemotherapy with Abraxane for her metastatic breast cancer.  She went to the ED on 10/15/2013 with the complaints of left-sided chest pain and her cardiac workup was negative for any IHD/ MI. She complains of pain in her left upper quadrant,  left sided rib cage and in the mid back .her pain is somewhat reviewed by  long-acting OxyContin and also intermittent oxycodone treatments that she takes 5 mg every 4 hours for break through pain.   She is getting her right arm wrapped twice a week at the outpatient physical therapy facility. She still has decreased sensation in the right hand. She denies any fever, chills, blurred vision, dizziness, blood in the stool blood in the urine headaches. She does complain of decrease in appetite and  she takes  laxatives for constipation.   REVIEW OF SYSTEMS:  A. 14 point review of systems is been assessed and as mentioned in interval history  PAST MEDICAL HISTORY: Past Medical History  Diagnosis Date  . Hypertension   . Hearing loss     right ear  . Hyperlipidemia   . Arthritis   . Dysrhythmia     A fib with IV chemo treatments  . Shortness of breath     with exertion  . 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 Breast cancer     (Rt) breast ca dx 9/11  . 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 Neuropathy   . History of radiation  therapy 01/18/11-03/07/11    5940 cGy to Right chest wall, axilla and supraclavicular region  . Lymphedema of arm 08/28/2013  . Tuberculosis     15 yrs ago- treated for 1 yeat    PAST SURGICAL HISTORY: Past Surgical History  Procedure Laterality Date  . Tubal ligation    . Portacath placement Left   . Abdominal hysterectomy    . Lung biopsy    . Lesion excision      L anterior thigh  . Breast surgery  12/01/10    ER+,PR+,Ki 67 64%,Her2 -; right breast lumpectomy  . Chest tube insertion  08/08/2012    Procedure: INSERTION PLEURAL DRAINAGE CATHETER;  Surgeon: Gaye Pollack, MD;  Location:  [facility];  Service: Thoracic;  Laterality: Right;  . Removal of pleural drainage catheter Right 10/10/2012    Procedure: REMOVAL OF PLEURAL DRAINAGE CATHETER;  Surgeon: Gaye Pollack, MD;  Location: [facility];  Service: Thoracic;  Laterality: Right;  . Mastectomy, radical Left 05/08/2013    Dr Marlou Starks  . Mastectomy modified radical Left 05/08/2013    Procedure: MASTECTOMY MODIFIED RADICAL;  Surgeon: Merrie Roof, MD;  Location: [facility];  Service: General;  Laterality: Left;    FAMILY HISTORY History reviewed. No pertinent family history. The patient's father died in an automobile accident at age 33. The patient's mother died at age 75 with Alzheimer's disease. The patient had 8 brothers and 4 sisters. There is no history of breast or ovarian cancer in the family.  GYNECOLOGIC HISTORY: Menarche age 56, first live birth age 37. She is GX P7. She underwent menopause "more than 20 years ago". She never took hormone replacement.  SOCIAL HISTORY:  (Updated 09/25/2013) She has been mostly a housewife. Her husband Jeanette Morris used to be a cook at [employer]. He completed radiation therapy for prostate cancer under Arlo[facility] Koh April of 2014. The patient's children are from an earlier marriage, and include Jeanette Morris, Jeanette Morris, Jeanette Morris, Jeanette Morris, Jeanette Morris, Jeanette Morris, and [employer]. The patient has 15 grandchildren and 1 great-grandchild. She attends [employer]  ADVANCED DIRECTIVES: Not in place  HEALTH MAINTENANCE: (Updated 09/25/2013) History  Substance Use Topics  . Smoking status: Former Smoker    Quit date: 06/30/1973  . Smokeless tobacco: Never Used  . Alcohol Use: No     Colonoscopy:  Not on file  PAP: Not on file  Bone density:  August 2012, Normal  Lipid panel: Not on file   No Known Allergies    Objective: Middle-aged [place] American woman examined in a wheelchair.     Filed Vitals:   10/16/13 1249  BP: 151/96  Pulse: 89  Temp:  98.9 F (37.2 C)  Resp: 20    Body mass index is 36.72 kg/(m^2).    ECOG FS: 2 Filed Weights   10/16/13 1249  Weight: 214 lb (97.07 kg)    HEENT- PERRLA, sclerae anicteric, conjunctiva no pallor, neck supple no JVD no thyromegaly Oropharynx clear and moist-- no thrush No cervical or supraclavicular adenopathy Lungs no rales or rhonchi Heart regular rate and rhythm Abd soft, obese, nontender, positive bowel sounds MSK bilateral opportunity lymphedema is stable; right upper extremity is wrapped Neuro: nonfocal, well oriented, appropriate affect Breasts: Status post bilateral mastectomies. Both axillae are benign.   LAB RESULTS: Lab Results  Component Value Date   WBC 16.4* 10/16/2013   NEUTROABS 14.7* 10/16/2013   HGB 10.6* 10/16/2013   HCT 33.3* 10/16/2013  MCV 91.6 10/16/2013   PLT 198 10/16/2013      Chemistry      Component Value Date/Time   NA 134* 10/16/2013 1211   NA 131* 10/15/2013 1917   K 5.1 10/16/2013 1211   K 5.3 10/15/2013 1917   CL 96 10/15/2013 1917   CL 106 02/14/2013 1048   CO2 25 10/16/2013 1211   CO2 26 10/15/2013 1917   BUN 9.6 10/16/2013 1211   BUN 9 10/15/2013 1917   CREATININE 0.9 10/16/2013 1211   CREATININE 0.89 10/15/2013 1917      Component Value Date/Time   CALCIUM 8.0* 10/16/2013 1211   CALCIUM 7.4* 10/15/2013 1917   ALKPHOS 92 10/16/2013 1211   ALKPHOS 66 09/23/2013 0440   AST 30 10/16/2013 1211   AST 18 09/23/2013 0440   ALT 14 10/16/2013 1211   ALT 7 09/23/2013 0440   BILITOT 0.55 10/16/2013 1211   BILITOT 0.3 09/23/2013 0440       Lab Results  Component Value Date   LABCA2 95* 08/14/2012    STUDIES: Dg Chest 2 View 09/17/2013   CLINICAL DATA:  Left upper quadrant pain.  EXAM: CHEST  2 VIEW  COMPARISON:  08/09/2013  FINDINGS: Port-A-Cath tip in the upper SVC and in a stable horizontal orientation. Evidence for small bilateral pleural effusions. Subtle densities in the right upper lung could represent asymmetric airspace disease. Heart size is  upper limits of normal but unchanged. Surgical clips in the axilla bilaterally. 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  IMPRESSION: Small bilateral pleural effusions.  Subtle lung densities, particularly on the right side. Findings could represent mild edema.   Electronically Signed   By: Markus Daft M.D.   On: 09/17/2013 17:16   Ct Chest W Contrast 09/13/2013   CLINICAL DATA:  History of right breast cancer diagnosed in 2011 and left breast cancer diagnosed in 2014. Chemotherapy in progress. Bilateral arm lymphedema and shortness of breath.  EXAM: CT CHEST WITH CONTRAST  TECHNIQUE: Multidetector CT imaging of the chest was performed during intravenous contrast administration.  CONTRAST:  31m OMNIPAQUE IOHEXOL 300 MG/ML  SOLN  COMPARISON:  NM PET IMAGE RESTAG (PS) SKULL BASE TO THIGH dated 09/13/2013; CT ANGIO CHEST W/CM &/OR WO/CM dated 07/22/2013  FINDINGS: Left subclavian Port-A-Cath tip is unchanged in the upper SVC. 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  There is residual supraclavicular soft tissue fullness bilaterally, hypermetabolic on today's PET-CT and consistent with residual metastatic adenopathy. This has improved from the prior studies, although is not well defined on CT. On the left, this measures approximately 3.5 x 2.4 cm on image 6. A 2.0 cm soft tissue nodule adjacent to the left clavicle on image number 1 is hypermetabolic on PET. The subcutaneous nodularity throughout the left chest wall and axilla has improved. There is less asymmetric enlargement of the left pectoralis muscle.  There are no enlarged mediastinal, hilar or internal mammary lymph nodes. Small bilateral pleural effusions are present. There is no pericardial effusion. The heart is enlarged. There are diffuse coronary artery calcifications.  Mild dependent atelectasis is present in both lower lobes. There are no suspicious  pulmonary nodules.  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 There is no adrenal mass.  Multiple lytic metastases involving the lower thoracic spine have progressed. These are most prominent within the left aspects of T9 through T12 vertebral bodies. There is destruction of the left eleventh rib at the  costovertebral junction. There is adjacent left paraspinal tumor extending into the neural foramina. No gross intraspinal extension is identified. Multiple rib metastases are demonstrated with a probable pathologic fracture of the right seventh rib posteriorly. There is also comminuted and displaced fracture of the medial right clavicle.  IMPRESSION: 1. Interval improvement in left chest wall tumor and bilateral supraclavicular nodal metastases. 2. No mediastinal adenopathy or pulmonary metastases identified. 3. Small bilateral pleural effusions without nodularity or hypermetabolism on today's PET-CT. 4. Multifocal osseous metastatic disease with progression, especially in the lower thoracic spine and left paraspinal soft tissues. Rib and right clavicle fractures are noted.   Electronically Signed   By: Camie Patience M.D.   On: 09/13/2013 14:50   Ct Abdomen Pelvis W Contrast 09/17/2013   CLINICAL DATA:  Left-sided abdominal pain. Vomiting. Breast carcinoma.  EXAM: CT ABDOMEN AND PELVIS WITH CONTRAST  TECHNIQUE: Multidetector CT imaging of the abdomen and pelvis was performed using the standard protocol following bolus administration of intravenous contrast.  CONTRAST:  123m OMNIPAQUE IOHEXOL 300 MG/ML  SOLN  COMPARISON:  PET-CT on 09/13/2013  FINDINGS: Images through the lung bases show no significant change in small to moderate bilateral pleural effusions.  Mild diffuse biliary ductal dilatation is again seen as well as porcelain gallbladder. No evidence of acute cholecystitis. No liver masses are identified. The pancreas, spleen, adrenal glands, and left kidney are normal in appearance. Severe right  hydronephrosis is again demonstrated, however no obstructing calculus or mass is visualized, and this is stable since previous study. A congenital right UPJ obstruction cannot be excluded.  No abdominal or pelvic soft tissue masses or lymphadenopathy identified. No evidence of acute inflammatory process or abnormal fluid collections. Colonic diverticulosis noted, however there is no evidence of diverticulitis. Tiny amount of free fluid noted in pelvic cul-de-sac, which is nonspecific. Mild diffuse body wall edema noted.  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 No other suspicious bone lesions identified.  IMPRESSION: No acute findings within the abdomen or pelvis.  Stable severe right hydronephrosis. Etiology not apparent by CT. Congenital UPJ obstruction cannot be excluded.  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   Electronically Signed   By: JEarle GellM.D.   On: 09/17/2013 18:57   Nm Pet Image Restag (ps) Skull Base To Thigh 09/13/2013   CLINICAL DATA:  Subsequent treatment strategy for bilateral breast cancer.  EXAM: NUCLEAR MEDICINE PET SKULL BASE TO THIGH  FASTING BLOOD GLUCOSE:  Value: 1173mdl  TECHNIQUE: 16.4 mCi F-18 FDG was injected intravenously. CT data was obtained and used for attenuation correction and anatomic localization only. (This was not acquired as a diagnostic CT examination.) Additional exam technical data entered on technologist worksheet.  COMPARISON:  NM PET IMAGE RESTAG (PS) SKULL BASE TO THIGH dated 07/02/2013  FINDINGS: NECK  There is persistent but improved bilateral supraclavicular hypermetabolic adenopathy.These are ill-defined and somewhat difficult to measure, although measure approximately 2.2 cm on the right (image 45, SUV max 8.4) and 1.9 cm on the left (image 49, SUV max 7.0). There is no upper cervical adenopathy. Asymmetric  goiter appears stable without abnormal metabolic activity.  CHEST  There has been interval improvement in the multifocal activity within the left chest wall and axilla. There is a residual 2.5 cm component adjacent to the left clavicle on image 41. There is no mediastinal or hilar hypermetabolic nodal activity. Diffuse  muscular activity is again noted within the upper right arm, similar to the prior study. Substernal extension of goiter, coronary artery disease and bilateral pleural effusions are again noted. No residual abnormal activity is seen within the right pleural space.  ABDOMEN/PELVIS  There is no hypermetabolic activity within the liver, adrenal glands, spleen or pancreas. There is no significant residual central mesenteric hypermetabolic activity. The left external iliac node noted previously has resolved. There is new dilatation of the right renal pelvis and proximal ureter without high-grade ureteral obstruction. Porcelain gallbladder again noted.  SKELETON  Multifocal osseous metastases are again noted. The upper thoracic and rib activity noted previously has improved. However, there is worsening activity within the lower thoracic spine. This corresponds with enlarging lytic lesions involving the left aspects of the T9, T10 and T11 vertebral bodies and posterior elements (CT images 82- 100). There is left paraspinal soft tissue tumor as well as possible epidural tumor, difficult to assess given streak artifact from the patient's arms. Additional lesions are present within the ribs and bony pelvis.  IMPRESSION: 1. There has been improvement in the left chest wall activity and multiple nodal metastasis involving the left axilla and supraclavicular stations bilaterally. 2. Persistent bilateral pleural effusions without associated abnormal metabolic activity. 3. Fluctuating osseous metastases with improvement in the upper thoracic spine and upper right chest wall. There is worsening disease in the lower  left thoracic spine with the potential for epidural tumor in this region. Again, spinal assessment is limited by body habitus. If the patient has focal neurologic symptoms, further evaluation with MRI may be helpful.   Electronically Signed   By: Camie Patience M.D.   On: 09/13/2013 13:58    ASSESSMENT: 68 y.o. [place] woman with stage IV breast cancer  (1) status post RIGHT axillary lymph node biopsy 05/27/2010 for a clinical T4 N1, stage IIIB (inflammatory) invasive ductal carcinoma, grade not stated, estrogen receptor 94% positive, progesterone receptor 64% positive, with an MIB-1 of 60%, and HER-2 amplification by [facility] with a ratio of 7.5.  (2) neoadjuvant chemotherapy consisted of 6 cycles of standard carboplatin, docetaxel, trastuzumab, with the trastuzumab held cycles 5 and 6 due to a drop in her borderline ejection fraction (from 45-50% at baseline to 40-45% January 2012)-- total trastuzumab treatment = 2 months  (3) status post right mastectomy and axillary lymph node dissection 12/01/2010, showing no residual invasive carcinoma in the breast, but residual tumor in 20 of 21 axillary lymph nodes. (Only one of the 20 positive lymph nodes showed a significant residual tumor, measuring 5 mm; the remaining lymph nodes, including the evidence of extracapsular extension, showed single and clusters a few neoplastic cells with extensive therapy related changes).  (4) postmastectomy radiation to the right chest wall, axilla, and supraclavicular region completed 03/07/2011  (5) additional 2 months of trastuzumab given February through April 2013, again discontinued because of a drop in the ejection fraction and lateral S'  (6) on letrozole July 2012 to September 2013  (7) LEFT axillary adenopathy noted by Dr. Marlou Starks on exam July 2013, with biopsy of a left axillary node 04/17/2012 showing an invasive ductal carcinoma, estrogen receptor 91% positive, progesterone receptor 9% positive, with an MIB-1 of  45%, and no HER-2 amplification.   (8) PET scan 04/03/2012 showed multiple enlarged left axillary and small left supraclavicular lymph nodes that are moderately hypermetabolic, with some hypermetabolic activity within the left axillary tail. No other hypermetabolic lymph nodes identified. There was no involvement of the right axilla,  mediastinum or right chest wall and no extrathoracic metastases were demonstrated.  (8) on exemestane and everolimus September through December 2013  (9) s/p RIGHT thoracentesis 08/01/2012, cytologically positive; s/p temporary Pleurx; effusion resolved after Abraxane therapy  (10) Abraxane started 08/16/2012, given day 1 and day 8 of each 21 day cycle, completed 6 cycles (12 doses) 12/06/2012, with excellent response  (11) fulvestrant started 12/19/2012, stopped 04/11/2013 with progression  (12) trastuzumab resumed 12/21/2011. We scheduled the doses 4 weeks apart to see if they caould be better tolerated. However echo 02/04/2013 again showed a drop in the EF and S', so Herceptin stopped (last dose 01/17/2013). Most recent echo, 04/18/2013, shows the ejection fraction to have recovered.  (13) status post left modified radical mastectomy 05/08/2013 for a pT4, pN2a [tumor extended over 15 cm, all 6 sampled lymph nodes were involved) invasive lobular breast cancer, grade 3, which was HER-2 negative. It was estrogen receptor positive at 72%, progesterone receptor negative. Posterior margin was broadly positive  (14) tamoxifen started 05/29/2013, discontinued December 2014 with progression  (15) post-mastectomy irradiation to left chest wall and left [place] fossa planned, beginning 07/09/2013  (16) new baseline staging scans including a chest CT and PET scan were obtained 07/02/2013 suggesting progression of disease, but that is when compared to previous scans in August 2013. However in December of 2014 there was obvious disease progression on the chest wall.   (17)  Abraxane resumed 08/07/2013, with treatments day 1 and day 8 of every 21 day cycle, and Neulasta given day 9; with restaging after 2 full cycles showing a very favorable response  (18) zolendronate started 10/02/2013  PLAN:  #1Barbara is tolerating Abraxane well.  Her labs are acceptable for treatment today. #2 She will proceed with cycle 4 day 1 of chemotherapy with Abraxane today #3 She will get her Neulasta therapy on day #9 of  chemotherapy therapy  #4 We'll obtain MRI of the thoracic spine in view of worsening pain on pain medications to rule out disease progression and also previous PET scan showing worsening disease in the lower left thoracic spine with the potential for epidural tumor in this region.  #5 I have asked the patient to continue OxyContin as scheduled and increase oxycodone 5 mg every 3 hours as needed for breakthrough pain.  if this  Schedule does not improve her pain recommend changing oxycodone to dilaudid. #6 continue  zolendronate  as scheduled every 6 weeks for  total of  6 months, after which  planning to change  to every 3 months.  #7 followup visit in 1 week for cycle #4 day 8  of chemotherapy with Abraxane, CMP and CBC   Mellony is in agreement with the current plan of care. She knows to call us with any questions or concerns prior to her next visit   Wilmon Arms, MD Medical oncology     10/16/2013

## 2013-10-16 NOTE — Patient Instructions (Signed)
[facility] Discharge Instructions for Patients Receiving Chemotherapy  Today you received the following chemotherapy agent: Abraxane   To help prevent nausea and vomiting after your treatment, we encourage you to take your nausea medication as prescribed.    If you develop nausea and vomiting that is not controlled by your nausea medication, call the clinic.   BELOW ARE SYMPTOMS THAT SHOULD BE REPORTED IMMEDIATELY:  *FEVER GREATER THAN 100.5 F  *CHILLS WITH OR WITHOUT FEVER  NAUSEA AND VOMITING THAT IS NOT CONTROLLED WITH YOUR NAUSEA MEDICATION  *UNUSUAL SHORTNESS OF BREATH  *UNUSUAL BRUISING OR BLEEDING  TENDERNESS IN MOUTH AND THROAT WITH OR WITHOUT PRESENCE OF ULCERS  *URINARY PROBLEMS  *BOWEL PROBLEMS  UNUSUAL RASH Items with * indicate a potential emergency and should be followed up as soon as possible.  Feel free to call the clinic you have any questions or concerns. The clinic phone number is [phone].    

## 2013-10-16 NOTE — Telephone Encounter (Signed)
add to previous note. per PK she will add order for mri. pt aware that she will be contacted by central re mri.

## 2013-10-17 ENCOUNTER — Ambulatory Visit: Payer: PRIVATE HEALTH INSURANCE

## 2013-10-17 DIAGNOSIS — IMO0001 Reserved for inherently not codable concepts without codable children: Secondary | ICD-10-CM | POA: Diagnosis not present

## 2013-10-18 ENCOUNTER — Encounter: Payer: Self-pay | Admitting: Oncology

## 2013-10-19 ENCOUNTER — Emergency Department (HOSPITAL_COMMUNITY): Payer: PRIVATE HEALTH INSURANCE

## 2013-10-19 ENCOUNTER — Other Ambulatory Visit: Payer: Self-pay

## 2013-10-19 ENCOUNTER — Other Ambulatory Visit: Payer: PRIVATE HEALTH INSURANCE

## 2013-10-19 ENCOUNTER — Encounter (HOSPITAL_COMMUNITY): Payer: Self-pay | Admitting: Emergency Medicine

## 2013-10-19 ENCOUNTER — Inpatient Hospital Stay (HOSPITAL_COMMUNITY)
Admission: EM | Admit: 2013-10-19 | Discharge: 2013-11-01 | DRG: 280 | Disposition: A | Discharge status: Hospice | Payer: PRIVATE HEALTH INSURANCE | Attending: Family Medicine | Admitting: Family Medicine

## 2013-10-19 DIAGNOSIS — R7989 Other specified abnormal findings of blood chemistry: Secondary | ICD-10-CM

## 2013-10-19 DIAGNOSIS — Z923 Personal history of irradiation: Secondary | ICD-10-CM

## 2013-10-19 DIAGNOSIS — R5383 Other fatigue: Secondary | ICD-10-CM

## 2013-10-19 DIAGNOSIS — Z87891 Personal history of nicotine dependence: Secondary | ICD-10-CM

## 2013-10-19 DIAGNOSIS — L989 Disorder of the skin and subcutaneous tissue, unspecified: Secondary | ICD-10-CM

## 2013-10-19 DIAGNOSIS — J910 Malignant pleural effusion: Secondary | ICD-10-CM

## 2013-10-19 DIAGNOSIS — I2699 Other pulmonary embolism without acute cor pulmonale: Secondary | ICD-10-CM | POA: Diagnosis present

## 2013-10-19 DIAGNOSIS — R21 Rash and other nonspecific skin eruption: Secondary | ICD-10-CM | POA: Diagnosis present

## 2013-10-19 DIAGNOSIS — Z79899 Other long term (current) drug therapy: Secondary | ICD-10-CM

## 2013-10-19 DIAGNOSIS — Z515 Encounter for palliative care: Secondary | ICD-10-CM

## 2013-10-19 DIAGNOSIS — K5900 Constipation, unspecified: Secondary | ICD-10-CM | POA: Diagnosis present

## 2013-10-19 DIAGNOSIS — Z6835 Body mass index (BMI) 35.0-35.9, adult: Secondary | ICD-10-CM

## 2013-10-19 DIAGNOSIS — J90 Pleural effusion, not elsewhere classified: Secondary | ICD-10-CM

## 2013-10-19 DIAGNOSIS — R200 Anesthesia of skin: Secondary | ICD-10-CM

## 2013-10-19 DIAGNOSIS — C7951 Secondary malignant neoplasm of bone: Secondary | ICD-10-CM | POA: Diagnosis present

## 2013-10-19 DIAGNOSIS — I5043 Acute on chronic combined systolic (congestive) and diastolic (congestive) heart failure: Secondary | ICD-10-CM

## 2013-10-19 DIAGNOSIS — H9190 Unspecified hearing loss, unspecified ear: Secondary | ICD-10-CM | POA: Diagnosis present

## 2013-10-19 DIAGNOSIS — C7952 Secondary malignant neoplasm of bone marrow: Secondary | ICD-10-CM

## 2013-10-19 DIAGNOSIS — R0602 Shortness of breath: Secondary | ICD-10-CM

## 2013-10-19 DIAGNOSIS — D6859 Other primary thrombophilia: Secondary | ICD-10-CM | POA: Diagnosis present

## 2013-10-19 DIAGNOSIS — R0600 Dyspnea, unspecified: Secondary | ICD-10-CM | POA: Diagnosis present

## 2013-10-19 DIAGNOSIS — N179 Acute kidney failure, unspecified: Secondary | ICD-10-CM | POA: Diagnosis present

## 2013-10-19 DIAGNOSIS — E785 Hyperlipidemia, unspecified: Secondary | ICD-10-CM | POA: Diagnosis present

## 2013-10-19 DIAGNOSIS — Z170 Estrogen receptor positive status [ER+]: Secondary | ICD-10-CM

## 2013-10-19 DIAGNOSIS — I249 Acute ischemic heart disease, unspecified: Secondary | ICD-10-CM | POA: Diagnosis present

## 2013-10-19 DIAGNOSIS — B029 Zoster without complications: Secondary | ICD-10-CM | POA: Diagnosis present

## 2013-10-19 DIAGNOSIS — I890 Lymphedema, not elsewhere classified: Secondary | ICD-10-CM | POA: Diagnosis present

## 2013-10-19 DIAGNOSIS — E875 Hyperkalemia: Secondary | ICD-10-CM | POA: Diagnosis not present

## 2013-10-19 DIAGNOSIS — I498 Other specified cardiac arrhythmias: Secondary | ICD-10-CM | POA: Diagnosis present

## 2013-10-19 DIAGNOSIS — T502X5A Adverse effect of carbonic-anhydrase inhibitors, benzothiadiazides and other diuretics, initial encounter: Secondary | ICD-10-CM | POA: Diagnosis present

## 2013-10-19 DIAGNOSIS — R1013 Epigastric pain: Secondary | ICD-10-CM | POA: Diagnosis present

## 2013-10-19 DIAGNOSIS — R202 Paresthesia of skin: Secondary | ICD-10-CM

## 2013-10-19 DIAGNOSIS — I428 Other cardiomyopathies: Secondary | ICD-10-CM

## 2013-10-19 DIAGNOSIS — D72829 Elevated white blood cell count, unspecified: Secondary | ICD-10-CM

## 2013-10-19 DIAGNOSIS — R740 Nonspecific elevation of levels of transaminase and lactic acid dehydrogenase [LDH]: Secondary | ICD-10-CM

## 2013-10-19 DIAGNOSIS — C50919 Malignant neoplasm of unspecified site of unspecified female breast: Secondary | ICD-10-CM | POA: Diagnosis present

## 2013-10-19 DIAGNOSIS — G589 Mononeuropathy, unspecified: Secondary | ICD-10-CM | POA: Diagnosis present

## 2013-10-19 DIAGNOSIS — Z9010 Acquired absence of unspecified breast and nipple: Secondary | ICD-10-CM

## 2013-10-19 DIAGNOSIS — R7402 Elevation of levels of lactic acid dehydrogenase (LDH): Secondary | ICD-10-CM | POA: Diagnosis present

## 2013-10-19 DIAGNOSIS — T451X5A Adverse effect of antineoplastic and immunosuppressive drugs, initial encounter: Secondary | ICD-10-CM | POA: Diagnosis present

## 2013-10-19 DIAGNOSIS — E871 Hypo-osmolality and hyponatremia: Secondary | ICD-10-CM | POA: Diagnosis present

## 2013-10-19 DIAGNOSIS — R110 Nausea: Secondary | ICD-10-CM

## 2013-10-19 DIAGNOSIS — R7401 Elevation of levels of liver transaminase levels: Secondary | ICD-10-CM | POA: Diagnosis present

## 2013-10-19 DIAGNOSIS — R748 Abnormal levels of other serum enzymes: Secondary | ICD-10-CM | POA: Diagnosis present

## 2013-10-19 DIAGNOSIS — I427 Cardiomyopathy due to drug and external agent: Secondary | ICD-10-CM

## 2013-10-19 DIAGNOSIS — I214 Non-ST elevation (NSTEMI) myocardial infarction: Secondary | ICD-10-CM | POA: Diagnosis present

## 2013-10-19 DIAGNOSIS — G5600 Carpal tunnel syndrome, unspecified upper limb: Secondary | ICD-10-CM

## 2013-10-19 DIAGNOSIS — M129 Arthropathy, unspecified: Secondary | ICD-10-CM | POA: Diagnosis present

## 2013-10-19 DIAGNOSIS — R0609 Other forms of dyspnea: Secondary | ICD-10-CM | POA: Diagnosis present

## 2013-10-19 DIAGNOSIS — R627 Adult failure to thrive: Secondary | ICD-10-CM | POA: Diagnosis present

## 2013-10-19 DIAGNOSIS — I5023 Acute on chronic systolic (congestive) heart failure: Secondary | ICD-10-CM

## 2013-10-19 DIAGNOSIS — R5381 Other malaise: Secondary | ICD-10-CM | POA: Diagnosis present

## 2013-10-19 DIAGNOSIS — C50411 Malignant neoplasm of upper-outer quadrant of right female breast: Secondary | ICD-10-CM

## 2013-10-19 DIAGNOSIS — I10 Essential (primary) hypertension: Secondary | ICD-10-CM | POA: Diagnosis present

## 2013-10-19 DIAGNOSIS — C50412 Malignant neoplasm of upper-outer quadrant of left female breast: Secondary | ICD-10-CM

## 2013-10-19 DIAGNOSIS — D649 Anemia, unspecified: Secondary | ICD-10-CM | POA: Diagnosis present

## 2013-10-19 DIAGNOSIS — E669 Obesity, unspecified: Secondary | ICD-10-CM | POA: Diagnosis present

## 2013-10-19 DIAGNOSIS — R0989 Other specified symptoms and signs involving the circulatory and respiratory systems: Secondary | ICD-10-CM | POA: Diagnosis present

## 2013-10-19 DIAGNOSIS — I509 Heart failure, unspecified: Secondary | ICD-10-CM

## 2013-10-19 DIAGNOSIS — E43 Unspecified severe protein-calorie malnutrition: Secondary | ICD-10-CM | POA: Insufficient documentation

## 2013-10-19 DIAGNOSIS — R609 Edema, unspecified: Secondary | ICD-10-CM | POA: Diagnosis present

## 2013-10-19 DIAGNOSIS — R52 Pain, unspecified: Secondary | ICD-10-CM

## 2013-10-19 DIAGNOSIS — R109 Unspecified abdominal pain: Secondary | ICD-10-CM

## 2013-10-19 DIAGNOSIS — R112 Nausea with vomiting, unspecified: Secondary | ICD-10-CM | POA: Diagnosis present

## 2013-10-19 DIAGNOSIS — I5181 Takotsubo syndrome: Principal | ICD-10-CM | POA: Diagnosis present

## 2013-10-19 DIAGNOSIS — I429 Cardiomyopathy, unspecified: Secondary | ICD-10-CM

## 2013-10-19 DIAGNOSIS — Z66 Do not resuscitate: Secondary | ICD-10-CM | POA: Diagnosis present

## 2013-10-19 LAB — COMPREHENSIVE METABOLIC PANEL
ALBUMIN: 2.7 g/dL — AB (ref 3.5–5.2)
ALT: 14 U/L (ref 0–35)
AST: 40 U/L — ABNORMAL HIGH (ref 0–37)
Alkaline Phosphatase: 76 U/L (ref 39–117)
BILIRUBIN TOTAL: 0.7 mg/dL (ref 0.3–1.2)
BUN: 14 mg/dL (ref 6–23)
CHLORIDE: 94 meq/L — AB (ref 96–112)
CO2: 24 mEq/L (ref 19–32)
Calcium: 8.1 mg/dL — ABNORMAL LOW (ref 8.4–10.5)
Creatinine, Ser: 0.89 mg/dL (ref 0.50–1.10)
GFR calc Af Amer: 75 mL/min — ABNORMAL LOW (ref >90)
GFR calc non Af Amer: 65 mL/min — ABNORMAL LOW (ref >90)
Glucose, Bld: 124 mg/dL — ABNORMAL HIGH (ref 70–99)
Potassium: 5.2 mEq/L (ref 3.7–5.3)
Sodium: 129 mEq/L — ABNORMAL LOW (ref 137–147)
Total Protein: 6.7 g/dL (ref 6.0–8.3)

## 2013-10-19 LAB — CBC
HEMATOCRIT: 32.5 % — AB (ref 36.0–46.0)
Hemoglobin: 10.4 g/dL — ABNORMAL LOW (ref 12.0–15.0)
MCH: 29.4 pg (ref 26.0–34.0)
MCHC: 32 g/dL (ref 30.0–36.0)
MCV: 91.8 fL (ref 78.0–100.0)
Platelets: 200 10*3/uL (ref 150–400)
RBC: 3.54 MIL/uL — ABNORMAL LOW (ref 3.87–5.11)
RDW: 16.3 % — AB (ref 11.5–15.5)
WBC: 10.2 10*3/uL (ref 4.0–10.5)

## 2013-10-19 LAB — PROTIME-INR
INR: 1.07 (ref 0.00–1.49)
Prothrombin Time: 13.7 seconds (ref 11.6–15.2)

## 2013-10-19 LAB — I-STAT CHEM 8, ED
BUN: 14 mg/dL (ref 6–23)
CALCIUM ION: 1.12 mmol/L — AB (ref 1.13–1.30)
CHLORIDE: 98 meq/L (ref 96–112)
Creatinine, Ser: 1 mg/dL (ref 0.50–1.10)
Glucose, Bld: 121 mg/dL — ABNORMAL HIGH (ref 70–99)
HCT: 34 % — ABNORMAL LOW (ref 36.0–46.0)
HEMOGLOBIN: 11.6 g/dL — AB (ref 12.0–15.0)
Potassium: 5 mEq/L (ref 3.7–5.3)
Sodium: 134 mEq/L — ABNORMAL LOW (ref 137–147)
TCO2: 26 mmol/L (ref 0–100)

## 2013-10-19 LAB — HEPARIN LEVEL (UNFRACTIONATED): Heparin Unfractionated: 0.88 IU/mL — ABNORMAL HIGH (ref 0.30–0.70)

## 2013-10-19 LAB — D-DIMER, QUANTITATIVE (NOT AT ARMC): D DIMER QUANT: 2.23 ug{FEU}/mL — AB (ref 0.00–0.48)

## 2013-10-19 LAB — PRO B NATRIURETIC PEPTIDE
PRO B NATRI PEPTIDE: 666.3 pg/mL — AB (ref 0–125)
Pro B Natriuretic peptide (BNP): 661.2 pg/mL — ABNORMAL HIGH (ref 0–125)

## 2013-10-19 LAB — MAGNESIUM: MAGNESIUM: 1.6 mg/dL (ref 1.5–2.5)

## 2013-10-19 LAB — TROPONIN I
Troponin I: 0.54 ng/mL (ref <0.30)
Troponin I: 2.52 ng/mL (ref <0.30)

## 2013-10-19 LAB — MRSA PCR SCREENING: MRSA BY PCR: NEGATIVE

## 2013-10-19 MED ORDER — HEPARIN BOLUS VIA INFUSION
3000.0000 [IU] | Freq: Once | INTRAVENOUS | Status: AC
  Administered 2013-10-19: 3000 [IU] via INTRAVENOUS
  Filled 2013-10-19: qty 3000

## 2013-10-19 MED ORDER — SODIUM CHLORIDE 0.9 % IV SOLN
INTRAVENOUS | Status: DC
  Administered 2013-10-20: 20 mL/h via INTRAVENOUS
  Administered 2013-10-23 – 2013-10-31 (×3): via INTRAVENOUS

## 2013-10-19 MED ORDER — OXYCODONE HCL 5 MG PO TABS
5.0000 mg | ORAL_TABLET | Freq: Once | ORAL | Status: AC
  Administered 2013-10-19: 5 mg via ORAL
  Filled 2013-10-19: qty 1

## 2013-10-19 MED ORDER — GABAPENTIN 300 MG PO CAPS
300.0000 mg | ORAL_CAPSULE | Freq: Every day | ORAL | Status: DC
  Administered 2013-10-19 – 2013-10-30 (×12): 300 mg via ORAL
  Filled 2013-10-19 (×14): qty 1

## 2013-10-19 MED ORDER — DRONABINOL 2.5 MG PO CAPS
2.5000 mg | ORAL_CAPSULE | Freq: Two times a day (BID) | ORAL | Status: DC
  Administered 2013-10-20 – 2013-10-23 (×8): 2.5 mg via ORAL
  Filled 2013-10-19 (×8): qty 1

## 2013-10-19 MED ORDER — ASPIRIN EC 81 MG PO TBEC
81.0000 mg | DELAYED_RELEASE_TABLET | Freq: Every day | ORAL | Status: DC
  Administered 2013-10-20 – 2013-10-30 (×11): 81 mg via ORAL
  Filled 2013-10-19 (×13): qty 1

## 2013-10-19 MED ORDER — ATORVASTATIN CALCIUM 20 MG PO TABS
20.0000 mg | ORAL_TABLET | Freq: Every day | ORAL | Status: DC
  Administered 2013-10-19 – 2013-10-24 (×6): 20 mg via ORAL
  Filled 2013-10-19 (×8): qty 1

## 2013-10-19 MED ORDER — ASPIRIN 300 MG RE SUPP
300.0000 mg | RECTAL | Status: AC
  Filled 2013-10-19: qty 1

## 2013-10-19 MED ORDER — HEPARIN (PORCINE) IN NACL 100-0.45 UNIT/ML-% IJ SOLN
1050.0000 [IU]/h | INTRAMUSCULAR | Status: DC
  Administered 2013-10-19: 1200 [IU]/h via INTRAVENOUS
  Administered 2013-10-19 – 2013-10-24 (×4): 1050 [IU]/h via INTRAVENOUS
  Filled 2013-10-19 (×8): qty 250

## 2013-10-19 MED ORDER — NITROGLYCERIN 0.4 MG SL SUBL
0.4000 mg | SUBLINGUAL_TABLET | SUBLINGUAL | Status: DC | PRN
  Administered 2013-10-29: 0.4 mg via SUBLINGUAL
  Filled 2013-10-19: qty 1
  Filled 2013-10-19: qty 25

## 2013-10-19 MED ORDER — ASPIRIN 81 MG PO CHEW
324.0000 mg | CHEWABLE_TABLET | Freq: Once | ORAL | Status: AC
  Administered 2013-10-19: 324 mg via ORAL
  Filled 2013-10-19: qty 4

## 2013-10-19 MED ORDER — POLYETHYLENE GLYCOL 3350 17 G PO PACK
17.0000 g | PACK | Freq: Every day | ORAL | Status: DC
  Administered 2013-10-20 – 2013-10-29 (×5): 17 g via ORAL
  Filled 2013-10-19 (×14): qty 1

## 2013-10-19 MED ORDER — OXYCODONE HCL ER 40 MG PO T12A
40.0000 mg | EXTENDED_RELEASE_TABLET | Freq: Two times a day (BID) | ORAL | Status: DC
  Administered 2013-10-19 – 2013-10-30 (×22): 40 mg via ORAL
  Filled 2013-10-19 (×2): qty 2
  Filled 2013-10-19: qty 1
  Filled 2013-10-19: qty 2
  Filled 2013-10-19 (×2): qty 1
  Filled 2013-10-19: qty 2
  Filled 2013-10-19: qty 1
  Filled 2013-10-19: qty 2
  Filled 2013-10-19: qty 1
  Filled 2013-10-19: qty 2
  Filled 2013-10-19 (×2): qty 1
  Filled 2013-10-19: qty 2
  Filled 2013-10-19 (×2): qty 1
  Filled 2013-10-19: qty 2
  Filled 2013-10-19 (×5): qty 1

## 2013-10-19 MED ORDER — CARVEDILOL 6.25 MG PO TABS
6.2500 mg | ORAL_TABLET | Freq: Two times a day (BID) | ORAL | Status: DC
  Filled 2013-10-19 (×3): qty 1

## 2013-10-19 MED ORDER — ASPIRIN 81 MG PO CHEW
324.0000 mg | CHEWABLE_TABLET | ORAL | Status: AC
  Filled 2013-10-19: qty 4

## 2013-10-19 MED ORDER — OXYCODONE HCL 5 MG PO TABS
5.0000 mg | ORAL_TABLET | ORAL | Status: DC | PRN
  Administered 2013-10-20 – 2013-10-29 (×9): 5 mg via ORAL
  Filled 2013-10-19 (×9): qty 1

## 2013-10-19 MED ORDER — FERROUS SULFATE 325 (65 FE) MG PO TABS
325.0000 mg | ORAL_TABLET | Freq: Every day | ORAL | Status: DC
  Administered 2013-10-20 – 2013-10-30 (×11): 325 mg via ORAL
  Filled 2013-10-19 (×12): qty 1

## 2013-10-19 MED ORDER — FUROSEMIDE 10 MG/ML IJ SOLN
40.0000 mg | Freq: Two times a day (BID) | INTRAMUSCULAR | Status: DC
  Administered 2013-10-19 – 2013-10-20 (×3): 40 mg via INTRAVENOUS
  Filled 2013-10-19 (×8): qty 4

## 2013-10-19 MED ORDER — CYANOCOBALAMIN 500 MCG PO TABS
500.0000 ug | ORAL_TABLET | Freq: Every day | ORAL | Status: DC
  Administered 2013-10-20 – 2013-10-30 (×11): 500 ug via ORAL
  Filled 2013-10-19 (×15): qty 1

## 2013-10-19 NOTE — ED Provider Notes (Signed)
Pt left with me at change of shift to get results of VQ scan, repeat troponin and repeat EKG. She has been discussed with [facility] who are awaiting results of her VQ scan Patient denies any chest pain when I talk to her. She states she started getting short of breath about 3 AM this morning. She denies any nausea, vomiting, or diaphoresis. She states she has dyspnea on exertion. Patient reports she has breast cancer and has metastases to her rib cage on the left. She reports she had chemotherapy 3 days ago. She is followed by Dr. Griffith Citron. She takes oxycodone for her bony metastases pain.  Medications  heparin ADULT infusion 100 units/mL (25000 units/250 mL) (1,200 Units/hr Intravenous New Bag/Given 10/19/13 1117)  aspirin chewable tablet 324 mg (324 mg Oral Given 10/19/13 0603)  oxyCODONE (Oxy IR/ROXICODONE) immediate release tablet 5 mg (5 mg Oral Given 10/19/13 1019)  heparin bolus via infusion 3,000 Units (3,000 Units Intravenous New Bag/Given 10/19/13 1117)     09:15 Repeat troponin is much higher from 0.54 to 2.52. Will contact [facility] again.   09:30 Dr Stanford Breed given second troponin and repeat EKG results. He feels with her breast cancer she is at high risk for PE and wants to continue with getting the V/Q scan. He also wants to go ahead and start her on heparin.   15:14 spoke to the nuclear medicine tech and she states patient was unable to finish the test. This was not relayed to me.   15:37 Dr Marlou Porch is here in the ED and has seen patient. He wants to continue to treat her for PE, they will consult and get an echo (to see if she has a pericardial effusion, lower EF from chemotherapy, right heart strain from PE). He will also recheck her tomorrow. He would like hospitalist to admit.  15:54 Dr Doyle Askew, admit to step down, team 8  Results for orders placed during the hospital encounter of 10/19/13  CBC      Result Value Ref Range   WBC 10.2  4.0 - 10.5 K/uL   RBC 3.54  (*) 3.87 - 5.11 MIL/uL   Hemoglobin 10.4 (*) 12.0 - 15.0 g/dL   HCT 32.5 (*) 36.0 - 46.0 %   MCV 91.8  78.0 - 100.0 fL   MCH 29.4  26.0 - 34.0 pg   MCHC 32.0  30.0 - 36.0 g/dL   RDW 16.3 (*) 11.5 - 15.5 %   Platelets 200  150 - 400 K/uL  TROPONIN I      Result Value Ref Range   Troponin I 0.54 (*) <0.30 ng/mL  COMPREHENSIVE METABOLIC PANEL      Result Value Ref Range   Sodium 129 (*) 137 - 147 mEq/L   Potassium 5.2  3.7 - 5.3 mEq/L   Chloride 94 (*) 96 - 112 mEq/L   CO2 24  19 - 32 mEq/L   Glucose, Bld 124 (*) 70 - 99 mg/dL   BUN 14  6 - 23 mg/dL   Creatinine, Ser 0.89  0.50 - 1.10 mg/dL   Calcium 8.1 (*) 8.4 - 10.5 mg/dL   Total Protein 6.7  6.0 - 8.3 g/dL   Albumin 2.7 (*) 3.5 - 5.2 g/dL   AST 40 (*) 0 - 37 U/L   ALT 14  0 - 35 U/L   Alkaline Phosphatase 76  39 - 117 U/L   Total Bilirubin 0.7  0.3 - 1.2 mg/dL   GFR calc non Af  Amer 65 (*) >90 mL/min   GFR calc Af Amer 75 (*) >90 mL/min  PRO B NATRIURETIC PEPTIDE      Result Value Ref Range   Pro B Natriuretic peptide (BNP) 661.2 (*) 0 - 125 pg/mL  D-DIMER, QUANTITATIVE      Result Value Ref Range   D-Dimer, Quant 2.23 (*) 0.00 - 0.48 ug/mL-FEU  TROPONIN I      Result Value Ref Range   Troponin I 2.52 (*) <0.30 ng/mL  I-STAT CHEM 8, ED      Result Value Ref Range   Sodium 134 (*) 137 - 147 mEq/L   Potassium 5.0  3.7 - 5.3 mEq/L   Chloride 98  96 - 112 mEq/L   BUN 14  6 - 23 mg/dL   Creatinine, Ser 1.00  0.50 - 1.10 mg/dL   Glucose, Bld 121 (*) 70 - 99 mg/dL   Calcium, Ion 1.12 (*) 1.13 - 1.30 mmol/L   TCO2 26  0 - 100 mmol/L   Hemoglobin 11.6 (*) 12.0 - 15.0 g/dL   HCT 34.0 (*) 36.0 - 46.0 %    Increasing troponin, elevated ddimer  Dg Chest 2 View  10/19/2013   CLINICAL DATA:  Shortness of breath and wheezing.  EXAM: CHEST  2 VIEW  COMPARISON:  Chest radiograph performed 10/15/2013  FINDINGS: The lungs are mildly hypoexpanded. No definite focal consolidation, pleural effusion or pneumothorax is seen.  The  cardiomediastinal silhouette is borderline normal in size. No acute osseous abnormalities are seen. Clips are seen at both axilla. A left subclavian line is seen ending about the proximal SVC.  IMPRESSION: Lungs mildly hypoexpanded but grossly clear.   Electronically Signed   By: Garald Balding M.D.   On: 10/19/2013 04:24    #2 EKG    Date: 10/19/2013  Rate: 108  Rhythm: normal sinus rhythm  QRS Axis: normal  Intervals: normal  ST/T Wave abnormalities: nonspecific ST/T changes  Conduction Disutrbances:none  Narrative Interpretation: baseline wander, sinus arrthythmia  Old EKG Reviewed: changes noted from earlier today, PVC's no longer present   Diagnoses that have been ruled out:  None  Diagnoses that are still under consideration:  None  Final diagnoses:  DOE (dyspnea on exertion)  Abnormal cardiac enzyme level  Pulmonary embolism  suspected    Plan admission   Rolland Porter, MD, Abram Sander   Janice Norrie, MD 10/19/13 1600

## 2013-10-19 NOTE — Progress Notes (Signed)
ANTICOAGULATION CONSULT NOTE - Follow up  Pharmacy Consult for heparin Indication: pulmonary embolus  No Known Allergies  Patient Measurements: Body wt: 97.1kg on 10/16/13  IBW: 54.7kg Adj body wt: 71.6kg Heparin Dosing Weight: 77kg  Vital Signs: Temp: 98.5 F (36.9 C) (02/21 1717) Temp src: Oral (02/21 1717) BP: 148/86 mmHg (02/21 1900) Pulse Rate: 86 (02/21 1900)  Labs:  Recent Labs  10/19/13 0511 10/19/13 0522 10/19/13 0754 10/19/13 1023 10/19/13 1759  HGB 10.4* 11.6*  --   --   --   HCT 32.5* 34.0*  --   --   --   PLT 200  --   --   --   --   LABPROT  --   --   --  13.7  --   INR  --   --   --  1.07  --   HEPARINUNFRC  --   --   --   --  0.88*  CREATININE 0.89 1.00  --   --   --   TROPONINI 0.54*  --  2.52*  --   --     Assessment: 42 yoF admitted overnight 2/21, found to have a pulmonary embolus. Pt not on anticoagulation PTA. Pt with a h/o breast CA currently undergoing treatment with Abraxane, LD given 2/18. Baseline INR is ordered. Pharmacy has been consulted to dose IV heparin.  Hgb is 11.6, which appears above patient's baseline  plts 200k, also at or above patient's baseline  SCr 1.0, CrCl 60 ml/min  1st heparin level check SUPRAtherapeutic at 0.88 on 1200 units/hr but level drawn from same access where heparin gtt is infusing and is unable to be properly interpreted  Will decrease heparin infusion just in case and reorder new heparin level to be drawn from foot  Goal of Therapy:  Heparin level 0.3-0.7 units/ml Monitor platelets by anticoagulation protocol: Yes   Plan:  - decrease heparin gtt to 1050 units/hr - recheck heparin level 2/22 at 0200 using foot draw - daily heparin level and CBC - follow-up long term anticoagulation plans - pharmacy will follow-up daily  Thank you for the consult.  Johny Drilling, PharmD, BCPS Pager: [phone] Pharmacy: [phone] 10/19/2013 7:40 PM

## 2013-10-19 NOTE — ED Notes (Signed)
Bed: XT05 Expected date:  Expected time:  Means of arrival:  Comments: closed

## 2013-10-19 NOTE — Progress Notes (Signed)
ANTICOAGULATION CONSULT NOTE - Initial Consult  Pharmacy Consult for heparin Indication: pulmonary embolus  No Known Allergies  Patient Measurements: Body wt: 97.1kg on 10/16/13  IBW: 54.7kg Adj body wt: 71.6kg Heparin Dosing Weight: 77kg  Vital Signs: Temp: 98.6 F (37 C) (02/21 0853) Temp src: Oral (02/21 0853) BP: 124/64 mmHg (02/21 0853) Pulse Rate: 107 (02/21 0853)  Labs:  Recent Labs  10/16/13 1210 10/16/13 1211 10/19/13 0511 10/19/13 0522 10/19/13 0754  HGB 10.6*  --  10.4* 11.6*  --   HCT 33.3*  --  32.5* 34.0*  --   PLT 198  --  200  --   --   CREATININE  --  0.9 0.89 1.00  --   TROPONINI  --   --  0.54*  --  2.52*    Medical History: Past Medical History  Diagnosis Date  . Hypertension   . Hearing loss     right ear  . Hyperlipidemia   . Arthritis   . Dysrhythmia     A fib with IV chemo treatments  . Shortness of breath     with exertion  . 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 Breast cancer     (Rt) breast ca dx 9/11  . 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 Neuropathy   . History of radiation therapy 01/18/11-03/07/11    5940 cGy to Right chest wall, axilla and supraclavicular region  . Lymphedema of arm 08/28/2013  . Tuberculosis     15 yrs ago- treated for 1 yeat  . History of radiation therapy 09/20/13, 09/23/13, 09/24/13    12 gray t o left lower chest region    Assessment: 20 yoF admitted overnight 2/21, found to have a pulmonary embolus. Pt not on anticoagulation PTA. Pt with a h/o breast CA currently undergoing treatment with Abraxane, LD given 2/18. Baseline INR is ordered. Pharmacy has been consulted to dose IV heparin.  Hgb is 11.6, which appears above patient's baseline  plts 200k, also at or above patient's baseline  SCr 1.0, CrCl 60 ml/min  Goal of Therapy:  Heparin level 0.3-0.7 units/ml Monitor platelets by anticoagulation protocol: Yes   Plan:  - heparin 3000 units IV bolus x1 - heparin gtt at 1200 units/hr - 6 hour  heparin level - daily heparin level and CBC - follow-up long term anticoagulation plans - pharmacy will follow-up daily  Thank you for the consult.  Johny Drilling, PharmD, BCPS Pager: [phone] Pharmacy: [phone] 10/19/2013 10:23 AM

## 2013-10-19 NOTE — Consult Note (Signed)
Admit date: 10/19/2013 Referring Physician  Dr. Tomi Bamberger Primary Physician Benna Dunks, [name] School, NP Primary Cardiologist  Dr. Haroldine Laws Reason for Consultation  shortness of breath, elevated troponin  HPI: 68 year-old female with metastatic breast cancer originally diagnosed in September of 2011 with mild to moderate systolic dysfunction, EF in the 40-50% range dating back from 2003 with Herceptin being stopped in January of 2014 do to concern of once again decreasing ejection fraction who is here today with chief complaint of shortness of breath. Chest x-ray is unremarkable and EKG demonstrates sinus tachycardia with nonspecific T-wave changes in lateral precordial leads. She does not endorse any chest pain. Her troponin was elevated at 2.52. Because of this, we were initially consulted and it was felt prudent to try to exclude pulmonary embolism given her hypercoagulable state and other constellation of symptoms in the setting of normal chest x-ray.  Unfortunately, given her lymphedema, CT angiogram was unable to be performed. Also, she attempted to undergo VQ scan however this was technically challenging for her and she was unable to complete the test. Empirically, she has been started on heparin IV.  She is not state that she has had any recent change in edema in either upper or lower extremity. She does endorse orthopnea and worsening shortness of breath with exertion.  Her shortness of breath seemed to begin at approximately 3 AM this morning.  Currently, she is feeling better. Less short of breath. IV heparin is running. She is hungry.    PMH:   Past Medical History  Diagnosis Date  . Hypertension   . Hearing loss     right ear  . Hyperlipidemia   . Arthritis   . Dysrhythmia     A fib with IV chemo treatments  . Shortness of breath     with exertion  . 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 Breast cancer     (Rt) breast ca dx 9/11  . 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  Neuropathy   . History of radiation therapy 01/18/11-03/07/11    5940 cGy to Right chest wall, axilla and supraclavicular region  . Lymphedema of arm 08/28/2013  . Tuberculosis     15 yrs ago- treated for 1 yeat  . History of radiation therapy 09/20/13, 09/23/13, 09/24/13    12 gray t o left lower chest region    PSH:   Past Surgical History  Procedure Laterality Date  . Tubal ligation    . Portacath placement Left   . Abdominal hysterectomy    . Lung biopsy    . Lesion excision      L anterior thigh  . Breast surgery  12/01/10    ER+,PR+,Ki 67 64%,Her2 -; right breast lumpectomy  . Chest tube insertion  08/08/2012    Procedure: INSERTION PLEURAL DRAINAGE CATHETER;  Surgeon: Gaye Pollack, MD;  Location: [facility];  Service: Thoracic;  Laterality: Right;  . Removal of pleural drainage catheter Right 10/10/2012    Procedure: REMOVAL OF PLEURAL DRAINAGE CATHETER;  Surgeon: Gaye Pollack, MD;  Location: [facility];  Service: Thoracic;  Laterality: Right;  . Mastectomy, radical Left 05/08/2013    Dr Marlou Starks  . Mastectomy modified radical Left 05/08/2013    Procedure: MASTECTOMY MODIFIED RADICAL;  Surgeon: Merrie Roof, MD;  Location: [facility] [place] [facility];  Service: General;  Laterality: Left;   Allergies:  Review of patient's allergies indicates no known allergies. Prior to Admit Meds:  Prior to Admission medications   Medication Sig Start Date End Date Taking? Authorizing Provider  Calcium Carbonate-Vitamin D 600-400 MG-UNIT per tablet Take 1 tablet by mouth every morning.    Yes Historical Provider, MD  carvedilol (COREG) 6.25 MG tablet Take 6.25 mg by mouth daily.   Yes Historical Provider, MD  cyanocobalamin 500 MCG tablet Take 500 mcg by mouth daily with breakfast.    Yes Historical Provider, MD  dronabinol (MARINOL) 2.5 MG capsule Take 1 capsule (2.5 mg total) by mouth 2 (two) times daily before lunch and supper. 09/23/13  Yes Velvet Bathe, MD  ferrous sulfate 325 (65 FE) MG tablet Take 1 tablet (325 mg  total) by mouth daily with breakfast. 08/05/12  Yes Janece Canterbury, MD  gabapentin (NEURONTIN) 300 MG capsule Take 300 mg by mouth at bedtime.    Yes Historical Provider, MD  ondansetron (ZOFRAN) 8 MG tablet Take 8 mg by mouth 2 (two) times daily.  08/29/13  Yes Historical Provider, MD  oxyCODONE (OXY IR/ROXICODONE) 5 MG immediate release tablet Take 1 tablet (5 mg total) by mouth every 4 (four) hours as needed for severe pain. 10/02/13  Yes Chauncey Cruel, MD  OxyCODONE (OXYCONTIN) 40 mg T12A 12 hr tablet Take 1 tablet (40 mg total) by mouth every 12 (twelve) hours. 10/02/13  Yes Chauncey Cruel, MD  polyethylene glycol (MIRALAX / GLYCOLAX) packet Take 17 g by mouth daily. 08/12/13  Yes Chauncey Cruel, MD  PRESCRIPTION MEDICATION Dr. Jana Hakim is oncologist. pegfilgrastim Okey Regal)  10/09/13  [place]   Yes Historical Provider, MD   Fam HX:   No family history on file. no early family history of coronary artery disease  Social HX:    History   Social History  . Marital Status: Married    Spouse Name: N/A    Number of Children: N/A  . Years of Education: N/A   Occupational History  . Not on file.   Social History Main Topics  . Smoking status: Former Smoker    Quit date: 06/30/1973  . Smokeless tobacco: Never Used  . Alcohol Use: No  . Drug Use: No  . Sexual Activity: Not on file   Other Topics Concern  . Not on file   Social History Narrative  . No narrative on file     ROS:  She has chronic pain from bony metastasis, left rib cage, spine. She takes oxycodone for this. Denies any recent strokelike symptoms, rashes, chest pain, unilateral edema, melena, diaphoresis. All 11 ROS were addressed and are negative except what is stated in the HPI   Physical Exam: Blood pressure 135/78, pulse 107, temperature 98.3 F (36.8 C), temperature source Oral, resp. rate 18, SpO2 100.00%.   General: Well developed, well nourished, in no acute distress Head: Eyes PERRLA, No xanthomas.    Normal cephalic and atramatic  Lungs:   Clear bilaterally to auscultation and percussion. Normal respiratory effort. No wheezes, no rales. Heart:   Tachycardic, regular with occasional ectopy S1 S2 Pulses are 2+ & equal. No murmur, rubs, gallops.  No carotid bruit. Difficult to assess JVD, but does not appear to be highly elevated.  No abdominal bruits.  Abdomen: Bowel sounds are positive, abdomen soft and non-tender without masses. No hepatosplenomegaly. Obese Msk:  Back normal. Normal strength and tone for age. Extremities:  No clubbing, cyanosis. Right arm is wrapped in Ace bandage do to lymphedema. Bilateral mild lower extremity edema. DP +1 Neuro: Alert and oriented X 3, non-focal,  MAE x 4 GU: Deferred Rectal: Deferred Psych:  Good affect, responds appropriately      Labs: Lab Results  Component Value Date   WBC 10.2 10/19/2013   HGB 11.6* 10/19/2013   HCT 34.0* 10/19/2013   MCV 91.8 10/19/2013   PLT 200 10/19/2013     Recent Labs Lab 10/19/13 0511 10/19/13 0522  NA 129* 134*  K 5.2 5.0  CL 94* 98  CO2 24  --   BUN 14 14  CREATININE 0.89 1.00  CALCIUM 8.1*  --   PROT 6.7  --   BILITOT 0.7  --   ALKPHOS 76  --   ALT 14  --   AST 40*  --   GLUCOSE 124* 121*    Recent Labs  10/19/13 0511 10/19/13 0754  TROPONINI 0.54* 2.52*    Lab Results  Component Value Date   DDIMER 2.23* 10/19/2013     Radiology:  Dg Chest 2 View  10/19/2013   CLINICAL DATA:  Shortness of breath and wheezing.  EXAM: CHEST  2 VIEW  COMPARISON:  Chest radiograph performed 10/15/2013  FINDINGS: The lungs are mildly hypoexpanded. No definite focal consolidation, pleural effusion or pneumothorax is seen.  The cardiomediastinal silhouette is borderline normal in size. No acute osseous abnormalities are seen. Clips are seen at both axilla. A left subclavian line is seen ending about the proximal SVC.  IMPRESSION: Lungs mildly hypoexpanded but grossly clear.   Electronically Signed   By: Garald Balding  M.D.   On: 10/19/2013 04:24   Personally viewed.  EKG:  nonspecific ST changes. Sinus tachycardia.  Personally viewed.   ASSESSMENT/PLAN:   68 year old female with metastatic breast cancer, previously described ejection fraction in the 40-50% range over the past decade who presented with shortness of breath starting at approximately 3 AM this morning with associated tachycardia, normal appearing chest x-ray. Troponin is elevated at 2.5.   1. Elevated troponin-could be secondary to pulmonary embolism. She does have a hypercoagulable state. She is tachycardic, short of breath. I would continue to treat empirically with anticoagulation. I will check an echocardiogram to make sure that she does not have any further deterioration of her ejection fraction as some of her symptoms could be attributable to acute systolic/diastolic heart failure. BNP is mildly elevated at 600. I will also empirically give her 40 mg IV twice a day Lasix to see if this also helps with her orthopnea/dyspnea. Troponin can be elevated in the setting of acute heart failure as well. The other possibility includes pericardial effusion. Blood pressure is reassuring currently. Checking echocardiogram. Chest x-ray is not indicative of large effusion.  In the past, she has had a malignant pleural effusion and Pleurx catheter back in 2013. There does not appear to be any significant effusion present. At that time, she had been on carvedilol 6.25 mg in the morning and 12.5 mg in the evening. Currently it's a 6.25 mg daily. As may be an error. I would go ahead and dose her with at least 6.25 mg twice a day.  We will continue to follow along. I will be rounding tomorrow.  Candee Furbish, MD  10/19/2013  3:41 PM

## 2013-10-19 NOTE — H&P (Addendum)
[facility] History and Physical  Chrystian Ressler ZSW:[id number] DOB: 12-08-1945 DOA: 10/19/2013  Referring physician: ED physician PCP: [name], NP   Chief Complaint: shortness of breath   HPI:  Pt is 68 yo female with metastatic breast cancer diagnosed in 2011 and curently on radiation and chemotherapy, last chemo one week ago, off Herceptin since 2014 due to decreased EF (per 2 D ECHO 09/2012 EF 40-45%) and most recent 2 D ECHO 03/2013 with EF 50-55% and grade I diastolic dysfunction, who presented to [facility] ED with main concern of several hours duration of progressively worsening shortness of breath and initially noted with exertion and now present at rest as well, pt explains she is unable to catch a breath. She denies chest pain, no specific abdominal or urinary concerns, no fevers, chills, no specific focal neurological symptoms, no sick contacts or exposures. In ED, pt noted to have elevated troponin at 2.52 and cardiology was consulted. ED doctor considered PE given pt's hypercoagulable state but CT was not done due to lymphedema. Empirically started on IV heparin in ED.   Assessment and Plan: Active Problems:   Dyspnea - unclear etiology at this time but certainly worrisome for PE  - appreciate cardiology input - proceed with 2 D ECHO - pt stared on Lasix, hold Coreg due to bradycardia and HR in 30's  - continue empiric heparin for presumptive PE    Elevated troponins - management as noted above    Systolic CHF, diastolic CHF (grade I) - 2 D ECHO 09/2012 with Ef 40-45 % and most recent 2 D ECHO 03/2013 with EF 50-55% and grade I diastolic dysfunction  - 2 D ECHO ordered for further evaluation  - pt started on Lasix 40 mg IV BID  - monitor daily weights, strict I's and O's - weight on admission 214 lbs - hold Coreg as HR now in 30's   Mild transaminitis - elevated AST on admission  - possibly related to principal problem   Breast cancer - will notify primary  oncologist of pt's admission (Dr. Jana Hakim)  Code Status: Full Family Communication: Pt at bedside Disposition Plan: Admit to SDU   Review of Systems:  Constitutional: Negative for diaphoresis.  HENT: Negative for hearing loss, ear pain, nosebleeds, congestion, sore throat, neck pain, tinnitus and ear discharge.   Eyes: Negative for blurred vision, double vision, photophobia, pain, discharge and redness.  Respiratory: Negative for cough, hemoptysis, wheezing and stridor.   Cardiovascular: Negative for claudication and leg swelling.  Gastrointestinal: Negative for nausea, vomiting and abdominal pain. Negative for heartburn, constipation, blood in stool and melena.  Genitourinary: Negative for dysuria, urgency, frequency, hematuria and flank pain.  Musculoskeletal: Negative for myalgias, back pain, joint pain and falls.  Skin: Negative for itching and rash.  Neurological:  Negative for tingling, tremors, sensory change, speech change, focal weakness, loss of consciousness and headaches.  Endo/Heme/Allergies: Negative for environmental allergies and polydipsia. Does not bruise/bleed easily.  Psychiatric/Behavioral: Negative for suicidal ideas. The patient is not nervous/anxious.      Past Medical History  Diagnosis Date  . Hypertension   . Hearing loss     right ear  . Hyperlipidemia   . Arthritis   . Dysrhythmia     A fib with IV chemo treatments  . Shortness of breath     with exertion  . 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 Breast cancer     (Rt) breast ca dx 9/11  .  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 Neuropathy   . History of radiation therapy 01/18/11-03/07/11    5940 cGy to Right chest wall, axilla and supraclavicular region  . Lymphedema of arm 08/28/2013  . Tuberculosis     15 yrs ago- treated for 1 yeat  . History of radiation therapy 09/20/13, 09/23/13, 09/24/13    12 gray t o left lower chest region    Past Surgical History  Procedure Laterality Date  . Tubal  ligation    . Portacath placement Left   . Abdominal hysterectomy    . Lung biopsy    . Lesion excision      L anterior thigh  . Breast surgery  12/01/10    ER+,PR+,Ki 67 64%,Her2 -; right breast lumpectomy  . Chest tube insertion  08/08/2012    Procedure: INSERTION PLEURAL DRAINAGE CATHETER;  Surgeon: Gaye Pollack, MD;  Location: [place] [facility];  Service: Thoracic;  Laterality: Right;  . Removal of pleural drainage catheter Right 10/10/2012    Procedure: REMOVAL OF PLEURAL DRAINAGE CATHETER;  Surgeon: Gaye Pollack, MD;  Location: [place];  Service: Thoracic;  Laterality: Right;  . Mastectomy, radical Left 05/08/2013    Dr Marlou Starks  . Mastectomy modified radical Left 05/08/2013    Procedure: MASTECTOMY MODIFIED RADICAL;  Surgeon: Merrie Roof, MD;  Location: [place];  Service: General;  Laterality: Left;    Social History:  reports that she quit smoking about 40 years ago. She has never used smokeless tobacco. She reports that she does not drink alcohol or use illicit drugs.  No Known Allergies  No known family medical history   Prior to Admission medications   Medication Sig Start Date End Date Taking? Authorizing Provider  Calcium Carbonate-Vitamin D 600-400 MG-UNIT per tablet Take 1 tablet by mouth every morning.    Yes Historical Provider, MD  carvedilol (COREG) 6.25 MG tablet Take 6.25 mg by mouth daily.   Yes Historical Provider, MD  cyanocobalamin 500 MCG tablet Take 500 mcg by mouth daily with breakfast.    Yes Historical Provider, MD  dronabinol (MARINOL) 2.5 MG capsule Take 1 capsule (2.5 mg total) by mouth 2 (two) times daily before lunch and supper. 09/23/13  Yes Velvet Bathe, MD  ferrous sulfate 325 (65 FE) MG tablet Take 1 tablet (325 mg total) by mouth daily with breakfast. 08/05/12  Yes Janece Canterbury, MD  gabapentin (NEURONTIN) 300 MG capsule Take 300 mg by mouth at bedtime.    Yes Historical Provider, MD  ondansetron (ZOFRAN) 8 MG tablet Take 8 mg by mouth 2 (two) times daily.   08/29/13  Yes Historical Provider, MD  oxyCODONE (OXY IR/ROXICODONE) 5 MG immediate release tablet Take 1 tablet (5 mg total) by mouth every 4 (four) hours as needed for severe pain. 10/02/13  Yes Chauncey Cruel, MD  OxyCODONE (OXYCONTIN) 40 mg T12A 12 hr tablet Take 1 tablet (40 mg total) by mouth every 12 (twelve) hours. 10/02/13  Yes Chauncey Cruel, MD  polyethylene glycol (MIRALAX / GLYCOLAX) packet Take 17 g by mouth daily. 08/12/13  Yes Chauncey Cruel, MD  PRESCRIPTION MEDICATION Dr. Jana Hakim is oncologist. pegfilgrastim (NEULASTA)  10/09/13  [place]   Yes Historical Provider, MD    Physical Exam: Filed Vitals:   10/19/13 0545 10/19/13 0853 10/19/13 1129 10/19/13 1548  BP: 138/61 124/64 135/78 133/53  Pulse: 111 107    Temp:  98.6 F (37 C) 98.3 F (36.8 C) 97.8 F (36.6 C)  TempSrc:  Oral Oral Oral  Resp:  _0 SpO2: 100% 100% 100% 100%    Physical Exam  Constitutional: Appears well-developed and well-nourished. No distress.  HENT: Normocephalic. External right and left ear normal. Oropharynx is clear and moist.  Eyes: Conjunctivae and EOM are normal. PERRLA, no scleral icterus.  Neck: Normal ROM. Neck supple. No JVD. No tracheal deviation. No thyromegaly.  CVS: Regular rhythm, tachycardic, S1/S2 +, no murmurs, no gallops, no carotid bruit.  Pulmonary: Effort and breath sounds normal, no stridor, rhonchi, diminished air movement at bases  Abdominal: Soft. BS +,  no distension, tenderness, rebound or guarding.  Musculoskeletal: Normal range of motion. Trace bilateral LE pitting edema Lymphadenopathy: No lymphadenopathy noted, cervical, inguinal. Neuro: Alert. Normal reflexes, muscle tone coordination. No cranial nerve deficit. Skin: Skin is warm and dry. No rash noted. Not diaphoretic. No erythema. No pallor.  Psychiatric: Normal mood and affect. Behavior, judgment, thought content normal.   Labs on Admission:  Basic Metabolic Panel:  Recent Labs Lab  10/15/13 1917 10/16/13 1211 10/19/13 0511 10/19/13 0522  NA 131* 134* 129* 134*  K 5.3 5.1 5.2 5.0  CL 96  --  94* 98  CO2 _1 --   GLUCOSE 118* 118 124* 121*  BUN 9 9._2 CREATININE 0.89 0.9 0.89 1.00  CALCIUM 7.4* 8.0* 8.1*  --    Liver Function Tests:  Recent Labs Lab 10/16/13 1211 10/19/13 0511  AST 30 40*  ALT 14 14  ALKPHOS 92 76  BILITOT 0.55 0.7  PROT 6.5 6.7  ALBUMIN 2.7* 2.7*   CBC:  Recent Labs Lab 10/15/13 1917 10/16/13 1210 10/19/13 0511 10/19/13 0522  WBC 22.2* 16.4* 10.2  --   NEUTROABS 19.8* 14.7*  --   --   HGB 10.4* 10.6* 10.4* 11.6*  HCT 32.7* 33.3* 32.5* 34.0*  MCV 93.2 91.6 91.8  --   PLT 183 198 200  --    Cardiac Enzymes:  Recent Labs Lab 10/15/13 1917 10/19/13 0511 10/19/13 0754  TROPONINI <0.30 0.54* 2.52*   Radiological Exams on Admission: Dg Chest 2 View   10/19/2013   Lungs mildly hypoexpanded but grossly clear.   EKG: Normal sinus rhythm, no ST/T wave changes  Faye Ramsay, MD  [facility] Pager [phone]  If 7PM-7AM, please contact night-coverage [email or web address] Password [facility] 10/19/2013, 4:10 PM

## 2013-10-19 NOTE — ED Notes (Signed)
Patient transported to X-ray 

## 2013-10-19 NOTE — ED Notes (Signed)
Per EMS pt from home c/o sob. Breathing treatment given in route 5mg  abuterol. Reports pt making a grunting sound while breathing but no wheezing reported just congested. Pt in nad. Pt on 2L O2 at home and sats 98% on arrival.

## 2013-10-19 NOTE — ED Notes (Signed)
Noitified EDP, Marnette Burgess, MD. Pt. i-stat Chem 8 results sodium 3.4.

## 2013-10-19 NOTE — ED Provider Notes (Signed)
CSN: [id number]     Arrival date & time 10/19/13  0315 History   First MD Initiated Contact with Patient 10/19/13 0335     Chief Complaint  Patient presents with  . Shortness of Breath     (Consider location/radiation/quality/duration/timing/severity/associated sxs/prior Treatment) HPI History provided by patient. History of breast cancer, currently undergoing radiation chemotherapy. Last chemotherapy a week ago. No fevers or chills. No cough. Shortness of breath onset tonight at home 1-2 hours prior to arrival. No associated chest pain. No nausea vomiting. No diaphoresis. Symptoms moderate in severity. Patient describes, feels like I can't catch my breath. Symptoms moderate severity. No known aggravating or alleviating factors. Patient is on 2 L oxygen at home.   Past Medical History  Diagnosis Date  . Hypertension   . Hearing loss     right ear  . Hyperlipidemia   . Arthritis   . Dysrhythmia     A fib with IV chemo treatments  . Shortness of breath     with exertion  . 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 Breast cancer     (Rt) breast ca dx 9/11  . 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 Neuropathy   . History of radiation therapy 01/18/11-03/07/11    5940 cGy to Right chest wall, axilla and supraclavicular region  . Lymphedema of arm 08/28/2013  . Tuberculosis     15 yrs ago- treated for 1 yeat  . History of radiation therapy 09/20/13, 09/23/13, 09/24/13    12 gray t o left lower chest region   Past Surgical History  Procedure Laterality Date  . Tubal ligation    . Portacath placement Left   . Abdominal hysterectomy    . Lung biopsy    . Lesion excision      L anterior thigh  . Breast surgery  12/01/10    ER+,PR+,Ki 67 64%,Her2 -; right breast lumpectomy  . Chest tube insertion  08/08/2012    Procedure: INSERTION PLEURAL DRAINAGE CATHETER;  Surgeon: Gaye Pollack, MD;  Location: [place] [facility];  Service: Thoracic;  Laterality: Right;  . Removal of pleural drainage catheter Right  10/10/2012    Procedure: REMOVAL OF PLEURAL DRAINAGE CATHETER;  Surgeon: Gaye Pollack, MD;  Location: [place];  Service: Thoracic;  Laterality: Right;  . Mastectomy, radical Left 05/08/2013    Dr Marlou Starks  . Mastectomy modified radical Left 05/08/2013    Procedure: MASTECTOMY MODIFIED RADICAL;  Surgeon: Merrie Roof, MD;  Location: [place];  Service: General;  Laterality: Left;   No family history on file. History  Substance Use Topics  . Smoking status: Former Smoker    Quit date: 06/30/1973  . Smokeless tobacco: Never Used  . Alcohol Use: No   OB History   Grav Para Term Preterm Abortions TAB SAB Ect Mult Living                 Review of Systems  Constitutional: Negative for fever and chills.  Respiratory: Positive for shortness of breath. Negative for cough and wheezing.   Cardiovascular: Negative for chest pain.  Gastrointestinal: Negative for vomiting and abdominal pain.  Genitourinary: Negative for flank pain.  Musculoskeletal: Negative for back pain and neck pain.  Skin: Negative for rash.  Neurological: Negative for weakness and headaches.  All other systems reviewed and are negative.      Allergies  Review of patient's allergies indicates no known allergies.  Home Medications  Current Outpatient Rx  Name  Route  Sig  Dispense  Refill  . Calcium Carbonate-Vitamin D 600-400 MG-UNIT per tablet   Oral   Take 1 tablet by mouth every morning.          . carvedilol (COREG) 6.25 MG tablet   Oral   Take 6.25 mg by mouth daily.         . cyanocobalamin 500 MCG tablet   Oral   Take 500 mcg by mouth daily with breakfast.          . dronabinol (MARINOL) 2.5 MG capsule   Oral   Take 1 capsule (2.5 mg total) by mouth 2 (two) times daily before lunch and supper.   15 capsule   0   . ferrous sulfate 325 (65 FE) MG tablet   Oral   Take 1 tablet (325 mg total) by mouth daily with breakfast.   30 tablet   1   . gabapentin (NEURONTIN) 300 MG capsule   Oral    Take 300 mg by mouth at bedtime.          . ondansetron (ZOFRAN) 8 MG tablet   Oral   Take 8 mg by mouth 2 (two) times daily.          Marland Kitchen oxyCODONE (OXY IR/ROXICODONE) 5 MG immediate release tablet   Oral   Take 1 tablet (5 mg total) by mouth every 4 (four) hours as needed for severe pain.   120 tablet   0   . OxyCODONE (OXYCONTIN) 40 mg T12A 12 hr tablet   Oral   Take 1 tablet (40 mg total) by mouth every 12 (twelve) hours.   60 tablet   0   . polyethylene glycol (MIRALAX / GLYCOLAX) packet   Oral   Take 17 g by mouth daily.   14 each   0   . PRESCRIPTION MEDICATION      Dr. Jana Hakim is oncologist. pegfilgrastim (NEULASTA)  10/09/13  [facility]          BP 160/84  Pulse 111  Temp(Src) 98.3 F (36.8 C) (Oral)  Resp 22  SpO2 100% Physical Exam  Constitutional: She is oriented to person, place, and time. She appears well-developed and well-nourished.  HENT:  Head: Normocephalic and atraumatic.  Eyes: EOM are normal. Pupils are equal, round, and reactive to light.  Neck: Neck supple.  Cardiovascular: Regular rhythm and intact distal pulses.   Tachycardic  Pulmonary/Chest: Effort normal and breath sounds normal. No respiratory distress. She has no wheezes. She has no rales. She exhibits no tenderness.  Abdominal: Soft. She exhibits no distension. There is no tenderness.  Musculoskeletal: Normal range of motion.  No lower extremity swelling or calf tenderness. Bilateral upper extremity lymphedema  Neurological: She is alert and oriented to person, place, and time.  Skin: Skin is warm and dry.    ED Course  Procedures (including critical care time) Labs Review Labs Reviewed  CBC - Abnormal; Notable for the following:    RBC 3.54 (*)    Hemoglobin 10.4 (*)    HCT 32.5 (*)    RDW 16.3 (*)    All other components within normal limits  TROPONIN I - Abnormal; Notable for the following:    Troponin I 0.54 (*)    All other components within normal limits   COMPREHENSIVE METABOLIC PANEL - Abnormal; Notable for the following:    Sodium 129 (*)    Chloride 94 (*)    Glucose, Bld  124 (*)    Calcium 8.1 (*)    Albumin 2.7 (*)    AST 40 (*)    GFR calc non Af Amer 65 (*)    GFR calc Af Amer 75 (*)    All other components within normal limits  PRO B NATRIURETIC PEPTIDE - Abnormal; Notable for the following:    Pro B Natriuretic peptide (BNP) 661.2 (*)    All other components within normal limits  D-DIMER, QUANTITATIVE - Abnormal; Notable for the following:    D-Dimer, Quant 2.23 (*)    All other components within normal limits  I-STAT CHEM 8, ED - Abnormal; Notable for the following:    Sodium 134 (*)    Glucose, Bld 121 (*)    Calcium, Ion 1.12 (*)    Hemoglobin 11.6 (*)    HCT 34.0 (*)    All other components within normal limits   Imaging Review Dg Chest 2 View  10/19/2013   CLINICAL DATA:  Shortness of breath and wheezing.  EXAM: CHEST  2 VIEW  COMPARISON:  Chest radiograph performed 10/15/2013  FINDINGS: The lungs are mildly hypoexpanded. No definite focal consolidation, pleural effusion or pneumothorax is seen.  The cardiomediastinal silhouette is borderline normal in size. No acute osseous abnormalities are seen. Clips are seen at both axilla. A left subclavian line is seen ending about the proximal SVC.  IMPRESSION: Lungs mildly hypoexpanded but grossly clear.   Electronically Signed   By: Garald Balding M.D.   On: 10/19/2013 04:24     Date: 10/19/2013  Rate: 112  Rhythm: sinus tachycardia  QRS Axis: normal  Intervals: normal  ST/T Wave abnormalities: nonspecific ST/T changes  Conduction Disutrbances:none  Narrative Interpretation:   Old EKG Reviewed: none available  6:26 AM aspirin provided for elevated troponin. He should also has elevated d-dimer 2.23 Previous records reviewed - patient does have a port that is NOT a power port, but a Proport. PT has severe upper extremity lymphedema and no upper extremity IV access. PE  study ordered  7:23 AM d/w CAR, DR Stanford Breed aware - plan V/Q scan now.   MDM   Diagnosis: Dyspnea, elevated troponin, history of breast cancer  Patient presents shortness of breath and tachycardia with a history of active breast cancer currently undergoing chemotherapy, at elevated risk for PE. EKG reviewed as above no STEMI, possible non-STEMI given elevated troponin.  D/w with cardiology. Patient has extremely poor peripheral IV access and unable to obtain CT angiogram.  V/Q scan ordered and patient care transferred to Dr. Tomi Bamberger, repeat EKG pending, repeat troponin pending and V/Q scan pending   Teressa Lower, MD 10/19/13 2302

## 2013-10-20 DIAGNOSIS — C50919 Malignant neoplasm of unspecified site of unspecified female breast: Secondary | ICD-10-CM

## 2013-10-20 DIAGNOSIS — I517 Cardiomegaly: Secondary | ICD-10-CM

## 2013-10-20 DIAGNOSIS — I2699 Other pulmonary embolism without acute cor pulmonale: Secondary | ICD-10-CM

## 2013-10-20 DIAGNOSIS — C800 Disseminated malignant neoplasm, unspecified: Secondary | ICD-10-CM

## 2013-10-20 LAB — HEPARIN LEVEL (UNFRACTIONATED)
HEPARIN UNFRACTIONATED: 0.6 [IU]/mL (ref 0.30–0.70)
HEPARIN UNFRACTIONATED: 0.55 [IU]/mL (ref 0.30–0.70)

## 2013-10-20 LAB — TSH: TSH: 0.085 u[IU]/mL — AB (ref 0.350–4.500)

## 2013-10-20 LAB — LIPID PANEL
Cholesterol: 147 mg/dL (ref 0–200)
HDL: 32 mg/dL — AB (ref >39)
LDL CALC: 102 mg/dL — AB (ref 0–99)
Total CHOL/HDL Ratio: 4.6 RATIO
Triglycerides: 64 mg/dL (ref <150)
VLDL: 13 mg/dL (ref 0–40)

## 2013-10-20 LAB — BASIC METABOLIC PANEL
BUN: 16 mg/dL (ref 6–23)
CALCIUM: 7.7 mg/dL — AB (ref 8.4–10.5)
CHLORIDE: 96 meq/L (ref 96–112)
CO2: 25 mEq/L (ref 19–32)
Creatinine, Ser: 0.88 mg/dL (ref 0.50–1.10)
GFR calc Af Amer: 76 mL/min — ABNORMAL LOW (ref >90)
GFR calc non Af Amer: 66 mL/min — ABNORMAL LOW (ref >90)
Glucose, Bld: 109 mg/dL — ABNORMAL HIGH (ref 70–99)
Potassium: 5.2 mEq/L (ref 3.7–5.3)
Sodium: 131 mEq/L — ABNORMAL LOW (ref 137–147)

## 2013-10-20 LAB — HEMOGLOBIN A1C
HEMOGLOBIN A1C: 6.1 % — AB (ref <5.7)
Mean Plasma Glucose: 128 mg/dL — ABNORMAL HIGH (ref <117)

## 2013-10-20 LAB — CBC
HEMATOCRIT: 29 % — AB (ref 36.0–46.0)
Hemoglobin: 9.5 g/dL — ABNORMAL LOW (ref 12.0–15.0)
MCH: 29.6 pg (ref 26.0–34.0)
MCHC: 32.8 g/dL (ref 30.0–36.0)
MCV: 90.3 fL (ref 78.0–100.0)
PLATELETS: 196 10*3/uL (ref 150–400)
RBC: 3.21 MIL/uL — AB (ref 3.87–5.11)
RDW: 15.9 % — ABNORMAL HIGH (ref 11.5–15.5)
WBC: 5.9 10*3/uL (ref 4.0–10.5)

## 2013-10-20 LAB — T4, FREE: Free T4: 2.28 ng/dL — ABNORMAL HIGH (ref 0.80–1.80)

## 2013-10-20 LAB — TROPONIN I: Troponin I: 1.59 ng/mL (ref <0.30)

## 2013-10-20 MED ORDER — CARVEDILOL 6.25 MG PO TABS
6.2500 mg | ORAL_TABLET | Freq: Two times a day (BID) | ORAL | Status: DC
  Administered 2013-10-20 – 2013-10-30 (×17): 6.25 mg via ORAL
  Filled 2013-10-20 (×27): qty 1

## 2013-10-20 MED ORDER — CLINDAMYCIN HCL 300 MG PO CAPS
300.0000 mg | ORAL_CAPSULE | Freq: Three times a day (TID) | ORAL | Status: DC
  Administered 2013-10-20 – 2013-10-25 (×17): 300 mg via ORAL
  Filled 2013-10-20 (×18): qty 1

## 2013-10-20 MED ORDER — MAGNESIUM SULFATE 4000MG/100ML IJ SOLN
4.0000 g | Freq: Once | INTRAMUSCULAR | Status: AC
  Administered 2013-10-20: 4 g via INTRAVENOUS
  Filled 2013-10-20: qty 100

## 2013-10-20 MED ORDER — MORPHINE SULFATE 2 MG/ML IJ SOLN
2.0000 mg | INTRAMUSCULAR | Status: DC | PRN
  Administered 2013-10-23 – 2013-10-24 (×3): 2 mg via INTRAVENOUS
  Filled 2013-10-20 (×4): qty 1

## 2013-10-20 NOTE — Progress Notes (Signed)
ANTICOAGULATION CONSULT NOTE - Follow Up Consult  Pharmacy Consult for Heparin Indication: pulmonary embolus  No Known Allergies  Patient Measurements: Height: 5\' 4"  (162.6 cm) Weight: 209 lb 7 oz (95 kg) IBW/kg (Calculated) : 54.7 Heparin Dosing Weight:   Vital Signs: Temp: 97 F (36.1 C) (02/22 0400) Temp src: Oral (02/22 0400) BP: 128/62 mmHg (02/22 0340) Pulse Rate: 48 (02/22 0340)  Labs:  Recent Labs  10/19/13 0511 10/19/13 0522 10/19/13 0754 10/19/13 1023 10/19/13 1759 10/20/13 0205 10/20/13 0400  HGB 10.4* 11.6*  --   --   --   --  9.5*  HCT 32.5* 34.0*  --   --   --   --  29.0*  PLT 200  --   --   --   --   --  196  LABPROT  --   --   --  13.7  --   --   --   INR  --   --   --  1.07  --   --   --   HEPARINUNFRC  --   --   --   --  0.88* 0.60  --   CREATININE 0.89 1.00  --   --   --   --  0.88  TROPONINI 0.54*  --  2.52*  --   --   --   --     Estimated Creatinine Clearance: 67.4 ml/min (by C-G formula based on Cr of 0.88).   Medications:  Infusions:  . sodium chloride 50 mL/hr at 10/19/13 1745  . heparin 1,050 Units/hr (10/19/13 1951)    Assessment: Patient with heparin level at goal.  No issues noted per RN.    Goal of Therapy:  Heparin level 0.3-0.7 units/ml Monitor platelets by anticoagulation protocol: Yes   Plan:  Continue with current heparin rate.  Recheck level at 0900  Nani Skillern Crowford 10/20/2013,5:03 AM

## 2013-10-20 NOTE — Progress Notes (Signed)
[facility] PROGRESS NOTE  Tatanya Southerly [id number] DOB: July 14, 1946 DOA: 10/19/2013 PCP: [name], NP  Assessment/Plan  Dyspnea possibly due to acute on chronic systolic heart failure exac vs. PE -  ECHO pending -  Appreciate cardiology assistance -  Coreg restarted by cards, monitor HR -  Lasix per cards - continue empiric heparin for presumptive PE   Elevated troponins  - management as noted above   Systolic CHF, diastolic CHF (grade I)  - 2 D ECHO 09/2012 with Ef 40-45 % and most recent 2 D ECHO 03/2013 with EF 50-55% and grade I diastolic dysfunction  - 2 D ECHO pending - Continue Lasix 40 mg IV BID  - monitor daily weights, strict I's and O's  - weight on admission 214 lbs > 209lbs  Mild transaminitis stable - elevated AST on admission   Breast cancer  - Dr. Jana Hakim added to rounding team  Rash posterior left shoulder, unclear etiology.  May be related to radiation, spread of cancer.  Not in dermatomal distribution -  Start clindamycin for possible cellulitis -  Monitor clinically -  Onc to please evaluate also  Diet:  regular Access:  PIV IVF:  off Proph:  Heparin gtt  Code Status: full Family Communication: patient and daughter Disposition Plan: pending improvement in dyspnea   Consultants:  Cardiology, Dr. Marlou Porch  Procedures:  ECHO  Antibiotics:  Clindamycin 2/22 >>   HPI/Subjective:  Persistent orthopnea.  Otherwise well  Objective: Filed Vitals:   10/20/13 0340 10/20/13 0400 10/20/13 0800 10/20/13 0831  BP: 128/62  117/69   Pulse: 48  73   Temp:  97 F (36.1 C)  98 F (36.7 C)  TempSrc:  Oral  Oral  Resp: 17  15   Height:      Weight:  95 kg (209 lb 7 oz)    SpO2: 100%  100%     Intake/Output Summary (Last 24 hours) at 10/20/13 [id number] Last data filed at 10/20/13 0800  Gross per 24 hour  Intake  901.5 ml  Output    825 ml  Net   76.5 ml   Filed Weights   10/19/13 1717 10/20/13 0400  Weight: 92.8 kg (204  lb 9.4 oz) 95 kg (209 lb 7 oz)    Exam:   General:  BF, No acute distress  HEENT:  NCAT, MMM  Cardiovascular:  IRRR mild tachycardia, nl S1, S2 no mrg, 2+ pulses, warm extremities  Respiratory:  CTAB, no increased WOB  Abdomen:   NABS, soft, NT/ND  MSK:   Normal tone and bulk, no LEE  Neuro:  Grossly intact  Skin:   left shoulder with some pustules, induration, peau-d'orange appearance almost.  Mid-back with flesh-colored papules, not in same dermatomal distribution  Data Reviewed: Basic Metabolic Panel:  Recent Labs Lab 10/15/13 1917 10/16/13 1211 10/19/13 0511 10/19/13 0522 10/19/13 1759 10/20/13 0400  NA 131* 134* 129* 134*  --  131*  K 5.3 5.1 5.2 5.0  --  5.2  CL 96  --  94* 98  --  96  CO2 26 25 24   --   --  25  GLUCOSE 118* 118 124* 121*  --  109*  BUN 9 9.6 14 14   --  16  CREATININE 0.89 0.9 0.89 1.00  --  0.88  CALCIUM 7.4* 8.0* 8.1*  --   --  7.7*  MG  --   --   --   --  1.6  --  Liver Function Tests:  Recent Labs Lab 10/16/13 1211 10/19/13 0511  AST 30 40*  ALT 14 14  ALKPHOS 92 76  BILITOT 0.55 0.7  PROT 6.5 6.7  ALBUMIN 2.7* 2.7*   No results found for this basename: LIPASE, AMYLASE,  in the last 168 hours No results found for this basename: AMMONIA,  in the last 168 hours CBC:  Recent Labs Lab 10/15/13 1917 10/16/13 1210 10/19/13 0511 10/19/13 0522 10/20/13 0400  WBC 22.2* 16.4* 10.2  --  5.9  NEUTROABS 19.8* 14.7*  --   --   --   HGB 10.4* 10.6* 10.4* 11.6* 9.5*  HCT 32.7* 33.3* 32.5* 34.0* 29.0*  MCV 93.2 91.6 91.8  --  90.3  PLT 183 198 200  --  196   Cardiac Enzymes:  Recent Labs Lab 10/15/13 1917 10/19/13 0511 10/19/13 0754  TROPONINI <0.30 0.54* 2.52*   BNP (last 3 results)  Recent Labs  08/03/13 2100 10/19/13 0510 10/19/13 1759  PROBNP 612.9* 661.2* 666.3*   CBG: No results found for this basename: GLUCAP,  in the last 168 hours  Recent Results (from the past 240 hour(s))  MRSA PCR SCREENING      Status: None   Collection Time    10/19/13  5:18 PM      Result Value Ref Range Status   MRSA by PCR NEGATIVE  NEGATIVE Final   Comment:            The GeneXpert MRSA Assay (FDA     approved for NASAL specimens     only), is one component of a     comprehensive MRSA colonization     surveillance program. It is not     intended to diagnose MRSA     infection nor to guide or     monitor treatment for     MRSA infections.     Studies: Dg Chest 2 View  10/19/2013   CLINICAL DATA:  Shortness of breath and wheezing.  EXAM: CHEST  2 VIEW  COMPARISON:  Chest radiograph performed 10/15/2013  FINDINGS: The lungs are mildly hypoexpanded. No definite focal consolidation, pleural effusion or pneumothorax is seen.  The cardiomediastinal silhouette is borderline normal in size. No acute osseous abnormalities are seen. Clips are seen at both axilla. A left subclavian line is seen ending about the proximal SVC.  IMPRESSION: Lungs mildly hypoexpanded but grossly clear.   Electronically Signed   By: Garald Balding M.D.   On: 10/19/2013 04:24    Scheduled Meds: . aspirin  324 mg Oral NOW   Or  . aspirin  300 mg Rectal NOW  . aspirin EC  81 mg Oral Daily  . atorvastatin  20 mg Oral q1800  . carvedilol  6.25 mg Oral BID WC  . cyanocobalamin  500 mcg Oral Q breakfast  . dronabinol  2.5 mg Oral BID AC  . ferrous sulfate  325 mg Oral Q breakfast  . furosemide  40 mg Intravenous BID  . gabapentin  300 mg Oral QHS  . magnesium sulfate 1 - 4 g bolus IVPB  4 g Intravenous Once  . OxyCODONE  40 mg Oral Q12H  . polyethylene glycol  17 g Oral Daily   Continuous Infusions: . sodium chloride 20 mL/hr (10/20/13 0737)  . heparin 1,050 Units/hr (10/19/13 1951)    Active Problems:   Dyspnea   ACS (acute coronary syndrome)    Time spent: 30 min    Danasha Melman  [facility]  Pager [phone]. If 7PM-7AM, please contact night-coverage at [email or web address], password [facility] 10/20/2013, 9:22 AM  LOS: 1  day

## 2013-10-20 NOTE — Progress Notes (Signed)
ANTICOAGULATION CONSULT NOTE - Follow Up Consult  Pharmacy Consult for Heparin Indication: ACS, rule out PE  No Known Allergies  Patient Measurements: Height: 5\' 4"  (162.6 cm) Weight: 209 lb 7 oz (95 kg) IBW/kg (Calculated) : 54.7 Heparin Dosing Weight: 77 kg  Vital Signs: Temp: 98 F (36.7 C) (02/22 0831) Temp src: Oral (02/22 0831) BP: 117/69 mmHg (02/22 0800) Pulse Rate: 73 (02/22 0800)  Labs:  Recent Labs  10/19/13 0511 10/19/13 0522 10/19/13 0754 10/19/13 1023 10/19/13 1759 10/20/13 0205 10/20/13 0400 10/20/13 0930  HGB 10.4* 11.6*  --   --   --   --  9.5*  --   HCT 32.5* 34.0*  --   --   --   --  29.0*  --   PLT 200  --   --   --   --   --  196  --   LABPROT  --   --   --  13.7  --   --   --   --   INR  --   --   --  1.07  --   --   --   --   HEPARINUNFRC  --   --   --   --  0.88* 0.60  --  0.55  CREATININE 0.89 1.00  --   --   --   --  0.88  --   TROPONINI 0.54*  --  2.52*  --   --   --   --  1.59*    Estimated Creatinine Clearance: 67.4 ml/min (by C-G formula based on Cr of 0.88).   Medications:  Scheduled:  . aspirin  324 mg Oral NOW   Or  . aspirin  300 mg Rectal NOW  . aspirin EC  81 mg Oral Daily  . atorvastatin  20 mg Oral q1800  . carvedilol  6.25 mg Oral BID WC  . clindamycin  300 mg Oral TID  . cyanocobalamin  500 mcg Oral Q breakfast  . dronabinol  2.5 mg Oral BID AC  . ferrous sulfate  325 mg Oral Q breakfast  . furosemide  40 mg Intravenous BID  . gabapentin  300 mg Oral QHS  . OxyCODONE  40 mg Oral Q12H  . polyethylene glycol  17 g Oral Daily   Infusions:  . sodium chloride 20 mL/hr (10/20/13 0737)  . heparin 1,050 Units/hr (10/19/13 1951)    Assessment: 68 yo female with metastatic breast cancer curently on radiation and chemotherapy. Admitted 2/21 with shortness of breath. IV heparin started for presumed PE (unable to get VQ scan) and elevated troponins.  Heparin level therapeutic (0.55) on 1050 units/hr - levels being  obtained via foot stick to to arm lymphedema so true levels may actually be higher.  H/H decreased, Plt stable  No bleeding reported per RN  Goal of Therapy:  Heparin level 0.3-0.7 units/ml Monitor platelets by anticoagulation protocol: Yes   Plan:   Continue heparin 1050 units/hr  Daily heparin level, CBC  Peggyann Juba, PharmD, BCPS Pager: [phone] 10/20/2013,10:17 AM

## 2013-10-20 NOTE — Progress Notes (Signed)
CRITICAL VALUE ALERT  Critical value received: troponin 1.59  Date of notification: 10/20/2013  Time of notification:  10:10  Critical value read back:yes  Nurse who received alert:  Lacinda Axon RN  MD notified (1st page):  Dr. Sheran Fava  Time of first page:  10:10  MD notified (2nd page):n/a  Time of second page:n/a  Responding MD:  Dr. Sheran Fava  Time MD responded:  10:13

## 2013-10-20 NOTE — Progress Notes (Signed)
  Echocardiogram 2D Echocardiogram has been performed.  Jeanette Morris 10/20/2013, 9:24 AM

## 2013-10-20 NOTE — Progress Notes (Addendum)
Subjective:  She is still having some difficulty breathing when laying flat. Overall no chest pain. Currently getting echocardiogram.  Could not complete VQ scan yesterday. No adequate IV access for CT angiogram.  Objective:  Vital Signs in the last 24 hours: Temp:  [97 F (36.1 C)-99.5 F (37.5 C)] 98 F (36.7 C) (02/22 0831) Pulse Rate:  [38-107] 73 (02/22 0800) Resp:  [10-24] 15 (02/22 0800) BP: (111-158)/(53-86) 117/69 mmHg (02/22 0800) SpO2:  [100 %] 100 % (02/22 0800) Weight:  [204 lb 9.4 oz (92.8 kg)-209 lb 7 oz (95 kg)] 209 lb 7 oz (95 kg) (02/22 0400)  Intake/Output from previous day: 02/21 0701 - 02/22 0700 In: 932 [I.V.:791] Out: 675 [Urine:675]   Physical Exam: General: Well developed, well nourished, in no acute distress. Head:  Normocephalic and atraumatic. Lungs: Clear to auscultation and percussion. Heart: Normal S1 and S2.  No murmur, rubs or gallops. Difficult to assess JVD Abdomen: soft, non-tender, positive bowel sounds. Obese Extremities: No clubbing or cyanosis.  Minor lower extremity edema. Ace wrap right arm lymphedema Neurologic: Alert and oriented x 3.    Lab Results:  Recent Labs  10/19/13 0511 10/19/13 0522 10/20/13 0400  WBC 10.2  --  5.9  HGB 10.4* 11.6* 9.5*  PLT 200  --  196    Recent Labs  10/19/13 0511 10/19/13 0522 10/20/13 0400  NA 129* 134* 131*  K 5.2 5.0 5.2  CL 94* 98 96  CO2 24  --  25  GLUCOSE 124* 121* 109*  BUN 14 14 16   CREATININE 0.89 1.00 0.88    Recent Labs  10/19/13 0511 10/19/13 0754  TROPONINI 0.54* 2.52*   Hepatic Function Panel  Recent Labs  10/19/13 0511  PROT 6.7  ALBUMIN 2.7*  AST 40*  ALT 14  ALKPHOS 76  BILITOT 0.7    Recent Labs  10/20/13 0400  CHOL 147   No results found for this basename: PROTIME,  in the last 72 hours  Imaging: Dg Chest 2 View  10/19/2013   CLINICAL DATA:  Shortness of breath and wheezing.  EXAM: CHEST  2 VIEW  COMPARISON:  Chest radiograph  performed 10/15/2013  FINDINGS: The lungs are mildly hypoexpanded. No definite focal consolidation, pleural effusion or pneumothorax is seen.  The cardiomediastinal silhouette is borderline normal in size. No acute osseous abnormalities are seen. Clips are seen at both axilla. A left subclavian line is seen ending about the proximal SVC.  IMPRESSION: Lungs mildly hypoexpanded but grossly clear.   Electronically Signed   By: Garald Balding M.D.   On: 10/19/2013 04:24   Personally viewed.   Telemetry: Sinus rhythm with frequent PACs, bigeminy pattern. Heart rate has remained in the 100 range. Personally viewed.   EKG:  Sinus rhythm with T wave inversion throughout precordial leads  Cardiac Studies:  Echocardiogram pending.  Assessment/Plan:  Active Problems:   Dyspnea   ACS (acute coronary syndrome)  68 year old with metastatic breast cancer, possible pulmonary embolism, abnormal EKG, elevated troponin  1. Elevated troponin-unfortunately, could not complete studies to evaluate for pulmonary embolism although this is a distinct possibility given her hypercoagulable state. ACS is a possibility as well. IV heparin. Noninvasive candidate. Worsening cardiomyopathy is also a possibility. Echocardiogram pending. Repeat troponin this morning  And continuing for now IV heparin for empiric treatment of thrombus, IV Lasix 40 mg twice a day for empiric treatment of congestion. BNP was mildly elevated. She states that this morning she may be breathing  slightly better but still has orthopnea.  EKG findings with T wave inversion could be seen in setting of PE or perhaps worsening cardiomyopathy. T-wave inversion is worse when compared to yesterday.  I would also like to exclude pericardial effusion.  I will go ahead and resume carvedilol at slightly lower dose of 6.25 mg twice a day.  SKAINS, [place] 10/20/2013, 8:49 AM    Addendum: 9:17 AM  Echocardiogram has just been performed, she does appear to  have normal basilar contraction however apical, distal anteroseptal, distal lateral, distal inferoseptal wall appears fairly akinetic. This pattern can be seen in stress induced cardiomyopathy or Takotsubo.  Other possibility includes worsening coronary artery disease in this distribution. Continue with current treatment plan. Ejection fraction in the 40% range. In review of MRI, cardiac on 02/14/12-EF was 30% and there is mid anterolateral and inferolateral wall hypokinesis.

## 2013-10-21 ENCOUNTER — Ambulatory Visit: Payer: PRIVATE HEALTH INSURANCE | Admitting: Radiation Oncology

## 2013-10-21 DIAGNOSIS — I200 Unstable angina: Secondary | ICD-10-CM

## 2013-10-21 DIAGNOSIS — I429 Cardiomyopathy, unspecified: Secondary | ICD-10-CM

## 2013-10-21 DIAGNOSIS — I5023 Acute on chronic systolic (congestive) heart failure: Secondary | ICD-10-CM

## 2013-10-21 LAB — CBC
HCT: 28.7 % — ABNORMAL LOW (ref 36.0–46.0)
Hemoglobin: 9.1 g/dL — ABNORMAL LOW (ref 12.0–15.0)
MCH: 29.1 pg (ref 26.0–34.0)
MCHC: 31.7 g/dL (ref 30.0–36.0)
MCV: 91.7 fL (ref 78.0–100.0)
Platelets: 176 10*3/uL (ref 150–400)
RBC: 3.13 MIL/uL — ABNORMAL LOW (ref 3.87–5.11)
RDW: 16.2 % — ABNORMAL HIGH (ref 11.5–15.5)
WBC: 4.8 10*3/uL (ref 4.0–10.5)

## 2013-10-21 LAB — BASIC METABOLIC PANEL
BUN: 21 mg/dL (ref 6–23)
CHLORIDE: 93 meq/L — AB (ref 96–112)
CO2: 29 meq/L (ref 19–32)
Calcium: 8.7 mg/dL (ref 8.4–10.5)
Creatinine, Ser: 1.22 mg/dL — ABNORMAL HIGH (ref 0.50–1.10)
GFR calc Af Amer: 51 mL/min — ABNORMAL LOW (ref >90)
GFR calc non Af Amer: 44 mL/min — ABNORMAL LOW (ref >90)
Glucose, Bld: 130 mg/dL — ABNORMAL HIGH (ref 70–99)
Potassium: 4.9 mEq/L (ref 3.7–5.3)
Sodium: 131 mEq/L — ABNORMAL LOW (ref 137–147)

## 2013-10-21 LAB — HEPARIN LEVEL (UNFRACTIONATED): HEPARIN UNFRACTIONATED: 0.59 [IU]/mL (ref 0.30–0.70)

## 2013-10-21 MED ORDER — DEXTROSE 5 % IV SOLN
5.0000 mg/kg | Freq: Three times a day (TID) | INTRAVENOUS | Status: DC
  Administered 2013-10-21 – 2013-10-25 (×13): 355 mg via INTRAVENOUS
  Filled 2013-10-21 (×16): qty 7.1

## 2013-10-21 MED ORDER — FUROSEMIDE 10 MG/ML IJ SOLN
80.0000 mg | Freq: Two times a day (BID) | INTRAMUSCULAR | Status: DC
  Administered 2013-10-21 (×2): 80 mg via INTRAVENOUS
  Filled 2013-10-21 (×5): qty 8

## 2013-10-21 MED ORDER — LISINOPRIL 5 MG PO TABS
5.0000 mg | ORAL_TABLET | Freq: Every day | ORAL | Status: DC
Start: 2013-10-21 — End: 2013-10-27
  Administered 2013-10-21 – 2013-10-25 (×4): 5 mg via ORAL
  Filled 2013-10-21 (×8): qty 1

## 2013-10-21 NOTE — Consult Note (Signed)
WOC wound consult note Reason for Consult:right buttock at gluteal crease full thickness wounds Wound type:Moisture associated skin damage and trauma from scrtching.  Not pressure. Pressure Ulcer POA: No Measurement:to wounds, full thickness.  Superior measures 1cm round x 0.2cm.  Inferior measures 1.5cm x 1cm oval with 0.2cm depth. Wound ZOX:WRUEA, pink, moist Drainage (amount, consistency, odor) serous, no odor Periwound:intact with evidence of previous healing. Dressing procedure/placement/frequency:I will continue the calcium alginate that they have been using at home.  Family has been trained in the dressing and observed doing changes by [facility].  They are bringing the calcium alginate in from home and may consider using ours, but at this time are pleased with their brand. [place] nursing team will not follow, but will remain available to this patient, the nursing and medical team.  Please re-consult if needed. Thanks, Maudie Flakes, MSN, RN, [place], [place], [place] [phone])

## 2013-10-21 NOTE — Progress Notes (Signed)
Subjective:  Overall no chest pain. Still states that she is having some shortness of breath when laying flat., Orthopnea.  Echocardiogram yesterday had the appearance of stress induced cardiomyopathy. EF in the 40% range.  Could not complete VQ scan. No adequate IV access for CT angiogram.  Objective:  Vital Signs in the last 24 hours: Temp:  [97.6 F (36.4 C)-98.4 F (36.9 C)] 98.2 F (36.8 C) (02/23 0400) Pulse Rate:  [32-106] 86 (02/23 0700) Resp:  [10-28] 10 (02/23 0700) BP: (119-145)/(46-70) 123/46 mmHg (02/23 0020) SpO2:  [81 %-100 %] 100 % (02/23 0700) Weight:  [207 lb 10.8 oz (94.2 kg)] 207 lb 10.8 oz (94.2 kg) (02/23 0400)  Intake/Output from previous day: 02/22 0701 - 02/23 0700 In: 1601.5 [P.O.:800; I.V.:701.5; IV Piggyback:100] Out: 1725 [Urine:1725]   Physical Exam: General: Well developed, well nourished, in no acute distress. Family at bedside Head:  Normocephalic and atraumatic. Lungs: Clear to auscultation and percussion. Heart: Normal S1 and S2.  No murmur, rubs or gallops. Difficult to assess JVD Abdomen: soft, non-tender, positive bowel sounds. Obese Extremities: No clubbing or cyanosis.  Minor lower extremity edema. Ace wrap right arm lymphedema Neurologic: Alert and oriented x 3.    Lab Results:  Recent Labs  10/20/13 0400 10/21/13 0645  WBC 5.9 4.8  HGB 9.5* 9.1*  PLT 196 176    Recent Labs  10/19/13 0511 10/19/13 0522 10/20/13 0400  NA 129* 134* 131*  K 5.2 5.0 5.2  CL 94* 98 96  CO2 24  --  25  GLUCOSE 124* 121* 109*  BUN 14 14 16   CREATININE 0.89 1.00 0.88    Recent Labs  10/19/13 0754 10/20/13 0930  TROPONINI 2.52* 1.59*   Hepatic Function Panel  Recent Labs  10/19/13 0511  PROT 6.7  ALBUMIN 2.7*  AST 40*  ALT 14  ALKPHOS 76  BILITOT 0.7    Recent Labs  10/20/13 0400  CHOL 147   Telemetry: Sinus rhythm with frequent PACs, bigeminy pattern. Heart rate has remained in the 90- 100 range. Personally  viewed.   EKG:  Sinus rhythm with T wave inversion diffusely.  Cardiac Studies:  Echocardiogram 10/20/13 - Left ventricle: The cavity size was normal. There was moderate concentric hypertrophy. Systolic function was mildly to moderately reduced. The estimated ejection fraction was in the range of 40% to 45%. There is severe hypokinesis of the distal anteroseptal, anterolateral, inferolateral, inferoseptal, and apical myocardium. Has appearance of stress induced cardiomyopathy, Takotsubo. Doppler parameters are consistent with abnormal left ventricular relaxation (grade 1 diastolic dysfunction). - Pulmonary arteries: Systolic pressure was mildly to moderately increased. PA peak pressure: 70mm Hg (S).  CT scan of chest 07/22/13 Diffuse, dense calcification of the LAD. Personally viewed.  Assessment/Plan:  Active Problems:   Dyspnea   ACS (acute coronary syndrome)  68 year old with metastatic breast cancer abnormal EKG, elevated troponin, cardiomyopathy, acute on chronic systolic heart failure.  1. Elevated troponin-troponin is currently trending down. Current objective evidence demonstrates akinesis of the apex, distal segments of her myocardium pointing [place] in the direction of cardiomyopathy, perhaps stress induced cardiomyopathy. T-wave inversion on EKG diffusely with mildly elevated troponin points [place] in this direction.  Unfortunately, could not complete studies to evaluate for pulmonary embolism although this is a distinct possibility given her hypercoagulable state. Her right ventricle did not appear to be stunned or significantly dilated reassuringly.   ACS is a possibility as well with her diffuse, dense LAD calcifications noted on prior CT  scan. Noninvasive candidate.   Given the evidence that we have of worsening cardiomyopathy, I think that we should tailor treatment in this direction.   Let's continue IV heparin for 24 more hours then discontinue.  I will increase IV Lasix  to 80 mg twice a day for treatment of congestion. BNP was mildly elevated. She states that this morning she may be breathing slightly better but still has orthopnea.  Continue with carvedilol 6.25 mg twice a day.  I will add low dose lisinopril 5 mg once a day for afterload reduction.  I'm comfortable with transfer to telemetry floor.  Ezariah Nace, [place] 10/21/2013, 8:28 AM    Addendum: 9:17 AM  Echocardiogram has just been performed, she does appear to have normal basilar contraction however apical, distal anteroseptal, distal lateral, distal inferoseptal wall appears fairly akinetic. This pattern can be seen in stress induced cardiomyopathy or Takotsubo.  Other possibility includes worsening coronary artery disease in this distribution. Continue with current treatment plan. Ejection fraction in the 40% range. In review of MRI, cardiac on 02/14/12-EF was 30% and there is mid anterolateral and inferolateral wall hypokinesis.

## 2013-10-21 NOTE — Progress Notes (Signed)
Patient ID: Jeanette Morris, female   DOB: [birth year], 68 y.o.   MRN: [id number]  [facility] PROGRESS NOTE  Jeanette Morris QVZ:[id number] DOB: 04/01/1946 DOA: 10/19/2013 PCP: Jeanette Pillow, NP  Brief narrative: 68 yo female with metastatic breast cancer diagnosed in 2011 and curently on radiation and chemotherapy, last chemo one week ago, off Herceptin since 2014 due to decreased EF (per 2 D ECHO 09/2012 EF 40-45%) and most recent 2 D ECHO 03/2013 with EF 50-55% and grade I diastolic dysfunction, who presented to [facility] ED with main concern of several hours duration of progressively worsening shortness of breath and initially noted with exertion and now present at rest as well, pt explains she is unable to catch a breath. She denies chest pain, no specific abdominal or urinary concerns, no fevers, chills, no specific focal neurological symptoms, no sick contacts or exposures. In ED, pt noted to have elevated troponin at 2.52 and cardiology was consulted. ED doctor considered PE given pt's hypercoagulable state but CT was not done due to lymphedema. Empirically started on IV heparin in ED.   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 ACS - 2 D ECHO with systolic dysfunction, EF 40 - 32%, grade I diastolic dysfunction  - Appreciate cardiology assistance  - Coreg restarted by cards, monitor HR  - Lasix per cards  - continue empiric heparin for presumptive PE  Elevated troponins  - management as noted above  Systolic CHF, diastolic CHF (grade I)  - clinically stable this AM - Continue Lasix 80 mg IV BID  - monitor daily weights, strict I's and O's  - weight on admission 214 lbs > 209lbs --> 207 lbs  Acute renal failure  - secondary to Lasix, close monitoring of renal function, may need to lower the dose of Lasix  - BMP in AM Mild transaminitis  - stable  - elevated AST on admission  Breast cancer  - Dr. Jana Hakim  added to rounding team  Rash posterior left shoulder, unclear etiology. May be related to radiation, spread of cancer. Not in dermatomal distribution  - Started clindamycin for possible cellulitis - I am ? Shingles as well - will also add empiric Acyclovir and will monitor clinical response    Code Status: full  Family Communication: patient and daughter  Disposition Plan: pending improvement in dyspnea   Consultants:  Cardiology, Dr. Marlou Porch Procedures:  ECHO Antibiotics:  Clindamycin 2/22 --> Acyclovir 2/23 -->  HPI/Subjective: No events overnight.   Objective: Filed Vitals:   10/21/13 0900 10/21/13 1000 10/21/13 1100 10/21/13 1200  BP: 114/53   115/66  Pulse: 63 94 90 89  Temp:    97.6 F (36.4 C)  TempSrc:    Oral  Resp: 13 11 12 12   Height:      Weight:      SpO2: 100% 99% 100% 100%    Intake/Output Summary (Last 24 hours) at 10/21/13 1344 Last data filed at 10/21/13 0900  Gross per 24 hour  Intake  758.5 ml  Output    775 ml  Net  -16.5 ml    Exam:   General:  Pt is alert, follows commands appropriately, not in acute distress  Skin: left shoulder macular rash with some papules, TTP and warm to touch   Cardiovascular: Regular rate and rhythm, S1/S2, no murmurs, no rubs, no gallops  Respiratory: Clear to auscultation bilaterally, no wheezing, no crackles, no rhonchi  Abdomen: Soft,  non tender, non distended, bowel sounds present, no guarding  Extremities: trace bilateral pitting LE edema, pulses DP and PT palpable bilaterally  Neuro: Grossly nonfocal  Data Reviewed: Basic Metabolic Panel:  Recent Labs Lab 10/15/13 1917 10/16/13 1211 10/19/13 0511 10/19/13 0522 10/19/13 1759 10/20/13 0400 10/21/13 1145  NA 131* 134* 129* 134*  --  131* 131*  K 5.3 5.1 5.2 5.0  --  5.2 4.9  CL 96  --  94* 98  --  96 93*  CO2 26 25 24   --   --  25 29  GLUCOSE 118* 118 124* 121*  --  109* 130*  BUN 9 9.6 14 14   --  16 21  CREATININE 0.89 0.9 0.89 1.00  --   0.88 1.22*  CALCIUM 7.4* 8.0* 8.1*  --   --  7.7* 8.7  MG  --   --   --   --  1.6  --   --    Liver Function Tests:  Recent Labs Lab 10/16/13 1211 10/19/13 0511  AST 30 40*  ALT 14 14  ALKPHOS 92 76  BILITOT 0.55 0.7  PROT 6.5 6.7  ALBUMIN 2.7* 2.7*   CBC:  Recent Labs Lab 10/15/13 1917 10/16/13 1210 10/19/13 0511 10/19/13 0522 10/20/13 0400 10/21/13 0645  WBC 22.2* 16.4* 10.2  --  5.9 4.8  NEUTROABS 19.8* 14.7*  --   --   --   --   HGB 10.4* 10.6* 10.4* 11.6* 9.5* 9.1*  HCT 32.7* 33.3* 32.5* 34.0* 29.0* 28.7*  MCV 93.2 91.6 91.8  --  90.3 91.7  PLT 183 198 200  --  196 176   Cardiac Enzymes:  Recent Labs Lab 10/15/13 1917 10/19/13 0511 10/19/13 0754 10/20/13 0930  TROPONINI <0.30 0.54* 2.52* 1.59*   Recent Results (from the past 240 hour(s))  MRSA PCR SCREENING     Status: None   Collection Time    10/19/13  5:18 PM      Result Value Ref Range Status   MRSA by PCR NEGATIVE  NEGATIVE Final   Comment:            The GeneXpert MRSA Assay (FDA     approved for NASAL specimens     only), is one component of a     comprehensive MRSA colonization     surveillance program. It is not     intended to diagnose MRSA     infection nor to guide or     monitor treatment for     MRSA infections.     Scheduled Meds: . acyclovir  5 mg/kg (Adjusted) Intravenous 3 times per day  . aspirin EC  81 mg Oral Daily  . atorvastatin  20 mg Oral q1800  . carvedilol  6.25 mg Oral BID WC  . clindamycin  300 mg Oral TID  . cyanocobalamin  500 mcg Oral Q breakfast  . dronabinol  2.5 mg Oral BID AC  . ferrous sulfate  325 mg Oral Q breakfast  . furosemide  80 mg Intravenous BID  . gabapentin  300 mg Oral QHS  . lisinopril  5 mg Oral Daily  . OxyCODONE  40 mg Oral Q12H  . polyethylene glycol  17 g Oral Daily   Continuous Infusions: . sodium chloride 20 mL/hr (10/20/13 0737)  . heparin 1,050 Units/hr (10/19/13 1951)   Faye Ramsay, MD  [facility] Pager [phone]  If  7PM-7AM, please contact night-coverage [email or web address] Password [facility] 10/21/2013, 1:44 PM  LOS: 2 days

## 2013-10-21 NOTE — Progress Notes (Signed)
CARE MANAGEMENT NOTE 10/21/2013  Patient:  Jeanette Morris, Jeanette Morris   Account Number:  [id number]  Date Initiated:  10/21/2013  Documentation initiated by:  Laiana Fratus  Subjective/Objective Assessment:   pt admitted due to shortness of breath found have positive cardiac markers and abnormal ecg.     Action/Plan:   from home has family near by. hx of breast Ca.   Anticipated DC Date:  10/24/2013   Anticipated DC Plan:  HOME/SELF CARE  In-house referral  NA      DC Planning Services  NA      [facility] Choice  NA   Choice offered to / List presented to:  NA   DME arranged  NA      DME agency  NA     [place] arranged  NA      [place] agency  NA   Status of service:  In process, will continue to follow Medicare Important Message given?  NA - LOS <3 / Initial given by admissions (If response is "NO", the following Medicare IM given date fields will be blank) Date Medicare IM given:   Date Additional Medicare IM given:    Discharge Disposition:    Per UR Regulation:  Reviewed for med. necessity/level of care/duration of stay  If discussed at [place] of Stay Meetings, dates discussed:    Comments:  [id number]/Dino Borntreger Eldridge Dace, [place], [place] [phone] Chart Reviewed for discharge and hospital needs. Discharge needs at time of review:  None present will follow for needs. Review of patient progress due on [id number].

## 2013-10-21 NOTE — Progress Notes (Signed)
ANTICOAGULATION CONSULT NOTE - Follow Up Consult  Pharmacy Consult for Heparin Indication: ACS, rule out PE  No Known Allergies  Patient Measurements: Height: 5\' 4"  (162.6 cm) Weight: 207 lb 10.8 oz (94.2 kg) IBW/kg (Calculated) : 54.7 Heparin Dosing Weight: 77 kg  Vital Signs: Temp: 98.2 F (36.8 C) (02/23 0400) Temp src: Oral (02/23 0400) BP: 123/46 mmHg (02/23 0020) Pulse Rate: 87 (02/23 0020)  Labs:  Recent Labs  10/19/13 0511 10/19/13 0522 10/19/13 0754 10/19/13 1023  10/20/13 0205 10/20/13 0400 10/20/13 0930 10/21/13 0530 10/21/13 0645  HGB 10.4* 11.6*  --   --   --   --  9.5*  --   --  9.1*  HCT 32.5* 34.0*  --   --   --   --  29.0*  --   --  28.7*  PLT 200  --   --   --   --   --  196  --   --  176  LABPROT  --   --   --  13.7  --   --   --   --   --   --   INR  --   --   --  1.07  --   --   --   --   --   --   HEPARINUNFRC  --   --   --   --   < > 0.60  --  0.55 0.59  --   CREATININE 0.89 1.00  --   --   --   --  0.88  --   --   --   TROPONINI 0.54*  --  2.52*  --   --   --   --  1.59*  --   --   < > = values in this interval not displayed.  Estimated Creatinine Clearance: 67.2 ml/min (by C-G formula based on Cr of 0.88).   Medications:  Scheduled:  . aspirin EC  81 mg Oral Daily  . atorvastatin  20 mg Oral q1800  . carvedilol  6.25 mg Oral BID WC  . clindamycin  300 mg Oral TID  . cyanocobalamin  500 mcg Oral Q breakfast  . dronabinol  2.5 mg Oral BID AC  . ferrous sulfate  325 mg Oral Q breakfast  . furosemide  40 mg Intravenous BID  . gabapentin  300 mg Oral QHS  . OxyCODONE  40 mg Oral Q12H  . polyethylene glycol  17 g Oral Daily   Infusions:  . sodium chloride 20 mL/hr (10/20/13 0737)  . heparin 1,050 Units/hr (10/19/13 1951)    Assessment: 68 yo female with metastatic breast cancer curently on radiation and chemotherapy. Admitted 2/21 with shortness of breath. IV heparin started for presumed PE (unable to get VQ scan) and elevated  troponins.  Heparin level remains in the therapeutic range on 1050 units/hr - levels being obtained via foot stick to to arm lymphedema so true levels may actually be higher.  H/H and pltc trended down slightly.  No bleeding reported per RN.  Goal of Therapy:  Heparin level 0.3-0.7 units/ml Monitor platelets by anticoagulation protocol: Yes   Plan:   Continue heparin 1050 units/hr  Daily heparin level & CBC.  F/u clinical course.   MD: Consider switching to Lovenox 1mg /kg q12h soon.   Romeo Rabon, PharmD, pager [phone]. 10/21/2013,7:28 AM.

## 2013-10-22 ENCOUNTER — Ambulatory Visit: Payer: PRIVATE HEALTH INSURANCE | Admitting: Physical Therapy

## 2013-10-22 DIAGNOSIS — M7989 Other specified soft tissue disorders: Secondary | ICD-10-CM

## 2013-10-22 DIAGNOSIS — N179 Acute kidney failure, unspecified: Secondary | ICD-10-CM

## 2013-10-22 LAB — BASIC METABOLIC PANEL
BUN: 29 mg/dL — ABNORMAL HIGH (ref 6–23)
CO2: 26 mEq/L (ref 19–32)
Calcium: 8.3 mg/dL — ABNORMAL LOW (ref 8.4–10.5)
Chloride: 89 mEq/L — ABNORMAL LOW (ref 96–112)
Creatinine, Ser: 1.82 mg/dL — ABNORMAL HIGH (ref 0.50–1.10)
GFR calc Af Amer: 32 mL/min — ABNORMAL LOW (ref >90)
GFR calc non Af Amer: 27 mL/min — ABNORMAL LOW (ref >90)
Glucose, Bld: 119 mg/dL — ABNORMAL HIGH (ref 70–99)
Potassium: 5.2 mEq/L (ref 3.7–5.3)
Sodium: 125 mEq/L — ABNORMAL LOW (ref 137–147)

## 2013-10-22 LAB — CBC
HCT: 25.3 % — ABNORMAL LOW (ref 36.0–46.0)
HEMOGLOBIN: 8.2 g/dL — AB (ref 12.0–15.0)
MCH: 29.9 pg (ref 26.0–34.0)
MCHC: 32.4 g/dL (ref 30.0–36.0)
MCV: 92.3 fL (ref 78.0–100.0)
Platelets: 185 10*3/uL (ref 150–400)
RBC: 2.74 MIL/uL — ABNORMAL LOW (ref 3.87–5.11)
RDW: 16.4 % — ABNORMAL HIGH (ref 11.5–15.5)
WBC: 3.7 10*3/uL — ABNORMAL LOW (ref 4.0–10.5)

## 2013-10-22 LAB — HEPARIN LEVEL (UNFRACTIONATED): Heparin Unfractionated: 0.36 IU/mL (ref 0.30–0.70)

## 2013-10-22 MED ORDER — SODIUM CHLORIDE 0.9 % IV BOLUS (SEPSIS)
500.0000 mL | Freq: Once | INTRAVENOUS | Status: AC
  Administered 2013-10-22: 500 mL via INTRAVENOUS

## 2013-10-22 MED ORDER — FUROSEMIDE 10 MG/ML IJ SOLN
80.0000 mg | Freq: Two times a day (BID) | INTRAMUSCULAR | Status: DC
  Administered 2013-10-23 – 2013-10-24 (×3): 80 mg via INTRAVENOUS
  Filled 2013-10-22 (×7): qty 8

## 2013-10-22 MED ORDER — ONDANSETRON HCL 4 MG/2ML IJ SOLN
4.0000 mg | Freq: Four times a day (QID) | INTRAMUSCULAR | Status: DC | PRN
  Administered 2013-10-22 – 2013-10-31 (×10): 4 mg via INTRAVENOUS
  Filled 2013-10-22 (×10): qty 2

## 2013-10-22 MED ORDER — GI COCKTAIL ~~LOC~~
30.0000 mL | Freq: Once | ORAL | Status: AC
  Administered 2013-10-22: 30 mL via ORAL
  Filled 2013-10-22: qty 30

## 2013-10-22 MED ORDER — SODIUM CHLORIDE 0.9 % IJ SOLN
10.0000 mL | INTRAMUSCULAR | Status: DC | PRN
  Administered 2013-10-27: 10 mL
  Administered 2013-10-27: 20 mL
  Administered 2013-10-28 – 2013-11-01 (×5): 10 mL

## 2013-10-22 MED ORDER — BIOTENE DRY MOUTH MT LIQD
15.0000 mL | Freq: Two times a day (BID) | OROMUCOSAL | Status: DC
  Administered 2013-10-22 – 2013-10-30 (×15): 15 mL via OROMUCOSAL

## 2013-10-22 NOTE — Progress Notes (Signed)
ANTICOAGULATION CONSULT NOTE - Follow Up Consult  Pharmacy Consult for Heparin Indication: ACS, rule out PE  No Known Allergies  Patient Measurements: Height: 5\' 4"  (162.6 cm) Weight: 206 lb 12.7 oz (93.8 kg) IBW/kg (Calculated) : 54.7 Heparin Dosing Weight: 77 kg  Vital Signs: Temp: 98.4 F (36.9 C) (02/24 0400) Temp src: Oral (02/24 0400) BP: 91/41 mmHg (02/24 0600) Pulse Rate: 90 (02/24 0600)  Labs:  Recent Labs  10/19/13 1023  10/20/13 0400 10/20/13 0930 10/21/13 0530 10/21/13 0645 10/21/13 1145 10/22/13 0515 10/22/13 0735  HGB  --   < > 9.5*  --   --  9.1*  --  8.2*  --   HCT  --   --  29.0*  --   --  28.7*  --  25.3*  --   PLT  --   --  196  --   --  176  --  185  --   LABPROT 13.7  --   --   --   --   --   --   --   --   INR 1.07  --   --   --   --   --   --   --   --   HEPARINUNFRC  --   < >  --  0.55 0.59  --   --   --  0.36  CREATININE  --   --  0.88  --   --   --  1.22*  --  1.82*  TROPONINI  --   --   --  1.59*  --   --   --   --   --   < > = values in this interval not displayed.  Estimated Creatinine Clearance: 32.4 ml/min (by C-G formula based on Cr of 1.82).   Medications:  Scheduled:  . acyclovir  5 mg/kg (Adjusted) Intravenous 3 times per day  . antiseptic oral rinse  15 mL Mouth Rinse BID  . aspirin EC  81 mg Oral Daily  . atorvastatin  20 mg Oral q1800  . carvedilol  6.25 mg Oral BID WC  . clindamycin  300 mg Oral TID  . cyanocobalamin  500 mcg Oral Q breakfast  . dronabinol  2.5 mg Oral BID AC  . ferrous sulfate  325 mg Oral Q breakfast  . furosemide  80 mg Intravenous BID  . gabapentin  300 mg Oral QHS  . lisinopril  5 mg Oral Daily  . OxyCODONE  40 mg Oral Q12H  . polyethylene glycol  17 g Oral Daily   Infusions:  . sodium chloride 20 mL/hr (10/20/13 0737)  . heparin 1,050 Units/hr (10/22/13 1191)    Assessment: 68 yo female with metastatic breast cancer curently on radiation and chemotherapy. Admitted 2/21 with shortness of  breath. IV heparin started for presumed PE (unable to get VQ scan) and elevated troponins.  Heparin level remains in the therapeutic range on 1050 units/hr - levels being obtained via foot stick to to arm lymphedema so true levels may actually be higher.  H/H cont to trend down slowly. Pltc stable.  No bleeding reported per RN.  Goal of Therapy:  Heparin level 0.3-0.7 units/ml Monitor platelets by anticoagulation protocol: Yes   Plan:   Continue heparin 1050 units/hr  Daily heparin level & CBC.  F/u clinical course.   Cardiology plans to stop heparin soon(for ACS indication), but has PE been effectively ruled-out?  Romeo Rabon, PharmD, pager [phone]. 10/22/2013,8:09  AM.

## 2013-10-22 NOTE — Progress Notes (Signed)
VASCULAR LAB PRELIMINARY  PRELIMINARY  PRELIMINARY  PRELIMINARY  Bilateral lower extremity venous duplex  completed.    Preliminary report:  Bilateral:  No evidence of DVT, superficial thrombosis, or Baker's Cyst.    Kadey Mihalic, RVT 10/22/2013, 11:39 AM

## 2013-10-22 NOTE — Progress Notes (Signed)
[facility] PROGRESS NOTE  Jeanette Morris RWE:[id number] DOB: 15-Jun-1946 DOA: 10/19/2013 PCP: [name], NP  Assessment/Plan: 1. Acute combined systolic and diastolic congestive heart failure. Patient presenting with dyspnea and exertion, with repeat transthoracic echocardiogram showing an EF of 40-45% with grade 1 diastolic dysfunction. Lower EF compared to prior transthoracic echocardiogram on 04/18/2014 at which time she had an EF of 55-60%. Cardiology consulted, she remains on Lasix 80 mg IV twice a day. Continue monitoring daily weights, strict ins and outs. 2. Elevated troponins. 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 Other possibilities include non-ST segment elevation myocardial infarction. She remains on IV heparin, beta blocker, ACE inhibitor, statin. Cardiology following. 3. Extremity edema. Venous Dopplers obtained on 10/22/2013 did not reveal evidence of DVT. 4. Acute kidney injury, likely secondary to diuretic therapy administered during this hospitalization. Her creatinine has trended up from 0.88-1.82 by 10/22/2013. 5. Hyponatremia. Patient's sodium levels at 25, suspect secondary to high bulimic state in setting of congestive heart failure. She is on Lasix 80 mg IV twice a day. 6. Rash. Patient having erythematous rash to left shoulder, of unclear etiology. She was started on clindamycin for possible cellulitis. Patient will start basically beer given the possibility of shingles. This could be related to radiation therapy as well. Will continue to monitor. 7. DVT prophylaxis. On IV heparin  Code Status: Full code Family Communication: I spoke with patient's daughter present at bedside Disposition Plan: Case discussed with cardiology, plan to continue IV heparin, close monitoring in the intensive care unit.   Consultants:  Cardiology  Procedures:  Transthoracic echocardiogram performed on 10/20/2013 showing  ejection fraction of 40-45%, severe hypokinesis of the distal anteroseptal, anterolateral, inferolateral, inferoseptal and apical myocardium.  Antibiotics:  Acyclovir  Clindamycin  HPI/Subjective: Patient is a pleasant 68 year old female with a past medical history of metastatic breast cancer that was diagnosed in 2011, status post chemoradiation therapy, history of chronic diastolic congestive heart failure with transthoracic echocardiogram performed on 04/18/2013 showing ejection fraction of 55-60%, with grade 1 diastolic dysfunction. She presented on 10/19/2013 with complaints of shortness of breath. Initial chest x-ray showed lungs mildly hypoexpanded but grossly clear. She was admitted to the medicine service. Initial troponin elevated at 0.54 which trended up to a peak level of 2.52. She was seen and evaluated by cardiology, started on a heparin drip. Repeat transthoracic echocardiogram on 10/20/2013 showing a depressed ejection fraction of 40-45% based on prior 2-D echo with severe hypokinesis of the distal anteroseptal, anterolateral, inferolateral, inferior septal and apical myocardium. Appearance may be consistent with Takosubo. She remains on IV Lasix 80 mg twice a day, lisinopril 5 mg by mouth daily and carvedilol 6.25 mg by mouth twice a day. This morning she reports feeling a little better, breathing easier.  Objective: Filed Vitals:   10/22/13 1300  BP:   Pulse: 93  Temp:   Resp: 15    Intake/Output Summary (Last 24 hours) at 10/22/13 1357 Last data filed at 10/22/13 1300  Gross per 24 hour  Intake 1022.8 ml  Output    385 ml  Net  637.8 ml   Filed Weights   10/20/13 0400 10/21/13 0400 10/22/13 0400  Weight: 95 kg (209 lb 7 oz) 94.2 kg (207 lb 10.8 oz) 93.8 kg (206 lb 12.7 oz)    Exam:  General: Pt is alert, follows commands appropriately, not in acute distress  Skin: left shoulder macular rash with some papules, TTP and warm to touch  Cardiovascular: Regular rate  and  rhythm, S1/S2, no murmurs, no rubs, no gallops  Respiratory: Clear to auscultation bilaterally, no wheezing, no crackles, no rhonchi  Abdomen: Soft, non tender, non distended, bowel sounds present, no guarding  Extremities: trace bilateral pitting LE edema, pulses DP and PT palpable bilaterally  Neuro: Grossly nonfocal   Data Reviewed: Basic Metabolic Panel:  Recent Labs Lab 10/16/13 1211 10/19/13 0511 10/19/13 0522 10/19/13 1759 10/20/13 0400 10/21/13 1145 10/22/13 0735  NA 134* 129* 134*  --  131* 131* 125*  K 5.1 5.2 5.0  --  5.2 4.9 5.2  CL  --  94* 98  --  96 93* 89*  CO2 25 24  --   --  25 29 26   GLUCOSE 118 124* 121*  --  109* 130* 119*  BUN 9.6 14 14   --  16 21 29*  CREATININE 0.9 0.89 1.00  --  0.88 1.22* 1.82*  CALCIUM 8.0* 8.1*  --   --  7.7* 8.7 8.3*  MG  --   --   --  1.6  --   --   --    Liver Function Tests:  Recent Labs Lab 10/16/13 1211 10/19/13 0511  AST 30 40*  ALT 14 14  ALKPHOS 92 76  BILITOT 0.55 0.7  PROT 6.5 6.7  ALBUMIN 2.7* 2.7*   No results found for this basename: LIPASE, AMYLASE,  in the last 168 hours No results found for this basename: AMMONIA,  in the last 168 hours CBC:  Recent Labs Lab 10/15/13 1917 10/16/13 1210 10/19/13 0511 10/19/13 0522 10/20/13 0400 10/21/13 0645 10/22/13 0515  WBC 22.2* 16.4* 10.2  --  5.9 4.8 3.7*  NEUTROABS 19.8* 14.7*  --   --   --   --   --   HGB 10.4* 10.6* 10.4* 11.6* 9.5* 9.1* 8.2*  HCT 32.7* 33.3* 32.5* 34.0* 29.0* 28.7* 25.3*  MCV 93.2 91.6 91.8  --  90.3 91.7 92.3  PLT 183 198 200  --  196 176 185   Cardiac Enzymes:  Recent Labs Lab 10/15/13 1917 10/19/13 0511 10/19/13 0754 10/20/13 0930  TROPONINI <0.30 0.54* 2.52* 1.59*   BNP (last 3 results)  Recent Labs  08/03/13 2100 10/19/13 0510 10/19/13 1759  PROBNP 612.9* 661.2* 666.3*   CBG: No results found for this basename: GLUCAP,  in the last 168 hours  Recent Results (from the past 240 hour(s))  MRSA PCR SCREENING      Status: None   Collection Time    10/19/13  5:18 PM      Result Value Ref Range Status   MRSA by PCR NEGATIVE  NEGATIVE Final   Comment:            The GeneXpert MRSA Assay (FDA     approved for NASAL specimens     only), is one component of a     comprehensive MRSA colonization     surveillance program. It is not     intended to diagnose MRSA     infection nor to guide or     monitor treatment for     MRSA infections.     Studies: No results found.  Scheduled Meds: . acyclovir  5 mg/kg (Adjusted) Intravenous 3 times per day  . antiseptic oral rinse  15 mL Mouth Rinse BID  . aspirin EC  81 mg Oral Daily  . atorvastatin  20 mg Oral q1800  . carvedilol  6.25 mg Oral BID WC  . clindamycin  300 mg  Oral TID  . cyanocobalamin  500 mcg Oral Q breakfast  . dronabinol  2.5 mg Oral BID AC  . ferrous sulfate  325 mg Oral Q breakfast  . furosemide  80 mg Intravenous BID  . gabapentin  300 mg Oral QHS  . lisinopril  5 mg Oral Daily  . OxyCODONE  40 mg Oral Q12H  . polyethylene glycol  17 g Oral Daily   Continuous Infusions: . sodium chloride 20 mL/hr (10/20/13 0737)  . heparin 1,050 Units/hr (10/22/13 7078)    Active Problems:   Dyspnea   ACS (acute coronary syndrome)    Time spent: 55 min    Kelvin Cellar  [facility] Pager [phone]. If 7PM-7AM, please contact night-coverage at [email or web address], password [facility] 10/22/2013, 1:57 PM  LOS: 3 days

## 2013-10-22 NOTE — Progress Notes (Signed)
Pt c/o of intermittent mid-epigastric pain.  Lasts only briefly, but comes and goes.  There have been no EKG changes and VSS, 12-lead has not shown any changes.  [facility] notified of pr pain and orders received. Will continue to monitor.

## 2013-10-22 NOTE — Progress Notes (Signed)
Patient Care Team: [name] as PCP - General Eston Esters, MD (Hematology and Oncology) Belva Chimes as Referring Physician (Orthopedic Surgery) Jolaine Artist, MD as Attending Physician (Cardiology)   HPI  Jeanette Morris is a 68 y.o. female With cx hx of metastatic breast cancer, hx of malignant pleural effusion, admitted with sob and + Tn;  Unable to exclude PE by imaging so treated empiracally withy heparin  Hx of cardiomyopathy assoc with chemoRx and herceptin discontinued  Echo EF 49-45% but Has appearance of stress induced cardiomyopathy, Takotsubo which to Dr Marlou Porch appears new  No dopplers  C/o chest pain pleuritic   Past Medical History  Diagnosis Date  . Hypertension   . Hearing loss     right ear  . Hyperlipidemia   . Arthritis   . Dysrhythmia     A fib with IV chemo treatments  . Shortness of breath     with exertion  . 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 Breast cancer     (Rt) breast ca dx 9/11  . 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 Neuropathy   . History of radiation therapy 01/18/11-03/07/11    5940 cGy to Right chest wall, axilla and supraclavicular region  . Lymphedema of arm 08/28/2013  . Tuberculosis     15 yrs ago- treated for 1 yeat  . History of radiation therapy 09/20/13, 09/23/13, 09/24/13    12 gray t o left lower chest region    Past Surgical History  Procedure Laterality Date  . Tubal ligation    . Portacath placement Left   . Abdominal hysterectomy    . Lung biopsy    . Lesion excision      L anterior thigh  . Breast surgery  12/01/10    ER+,PR+,Ki 67 64%,Her2 -; right breast lumpectomy  . Chest tube insertion  08/08/2012    Procedure: INSERTION PLEURAL DRAINAGE CATHETER;  Surgeon: Gaye Pollack, MD;  Location: [facility];  Service: Thoracic;  Laterality: Right;  . Removal of pleural drainage catheter Right 10/10/2012    Procedure: REMOVAL OF PLEURAL DRAINAGE CATHETER;  Surgeon: Gaye Pollack, MD;  Location: [facility];   Service: Thoracic;  Laterality: Right;  . Mastectomy, radical Left 05/08/2013    Dr Marlou Starks  . Mastectomy modified radical Left 05/08/2013    Procedure: MASTECTOMY MODIFIED RADICAL;  Surgeon: Merrie Roof, MD;  Location: [facility];  Service: General;  Laterality: Left;    Current Facility-Administered Medications  Medication Dose Route Frequency Provider Last Rate Last Dose  . 0.9 %  sodium chloride infusion   Intravenous Continuous Janece Canterbury, MD 20 mL/hr at 10/20/13 0737 20 mL/hr at 10/20/13 0737  . acyclovir (ZOVIRAX) 355 mg in dextrose 5 % 100 mL IVPB  5 mg/kg (Adjusted) Intravenous 3 times per day Theodis Blaze, MD   355 mg at 10/22/13 0552  . antiseptic oral rinse (BIOTENE) solution 15 mL  15 mL Mouth Rinse BID Theodis Blaze, MD      . aspirin EC tablet 81 mg  81 mg Oral Daily Theodis Blaze, MD   81 mg at 10/21/13 1009  . atorvastatin (LIPITOR) tablet 20 mg  20 mg Oral q1800 Theodis Blaze, MD   20 mg at 10/21/13 1837  . carvedilol (COREG) tablet 6.25 mg  6.25 mg Oral BID WC Candee Furbish, MD   6.25 mg at 10/21/13 1653  .  clindamycin (CLEOCIN) capsule 300 mg  300 mg Oral TID Janece Canterbury, MD   300 mg at 10/21/13 2304  . cyanocobalamin tablet 500 mcg  500 mcg Oral Q breakfast Theodis Blaze, MD   500 mcg at 10/22/13 [phone]  . dronabinol (MARINOL) capsule 2.5 mg  2.5 mg Oral BID AC Theodis Blaze, MD   2.5 mg at 10/21/13 1653  . ferrous sulfate tablet 325 mg  325 mg Oral Q breakfast Theodis Blaze, MD   325 mg at 10/22/13 0847  . furosemide (LASIX) injection 80 mg  80 mg Intravenous BID Candee Furbish, MD   80 mg at 10/21/13 1836  . gabapentin (NEURONTIN) capsule 300 mg  300 mg Oral QHS Theodis Blaze, MD   300 mg at 10/21/13 2304  . heparin ADULT infusion 100 units/mL (25000 units/250 mL)  1,050 Units/hr Intravenous Continuous Mosetta Pigeon, RPH 10.5 mL/hr at 10/22/13 0639 1,050 Units/hr at 10/22/13 0639  . lisinopril (PRINIVIL,ZESTRIL) tablet 5 mg  5 mg Oral Daily Candee Furbish, MD   5 mg at 10/21/13  1241  . morphine 2 MG/ML injection 2 mg  2 mg Intravenous Q2H PRN Theodis Blaze, MD      . nitroGLYCERIN (NITROSTAT) SL tablet 0.4 mg  0.4 mg Sublingual Q5 Min x 3 PRN Theodis Blaze, MD      . ondansetron [facility]) injection 4 mg  4 mg Intravenous Q6H PRN Gardiner Barefoot, NP   4 mg at 10/22/13 0140  . oxyCODONE (Oxy IR/ROXICODONE) immediate release tablet 5 mg  5 mg Oral Q4H PRN Dianne Dun, NP   5 mg at 10/20/13 1717  . OxyCODONE (OXYCONTIN) 12 hr tablet 40 mg  40 mg Oral Q12H Theodis Blaze, MD   40 mg at 10/21/13 2304  . polyethylene glycol (MIRALAX / GLYCOLAX) packet 17 g  17 g Oral Daily Theodis Blaze, MD   17 g at 10/21/13 1009  . sodium chloride 0.9 % injection 10-40 mL  10-40 mL Intracatheter PRN Kelvin Cellar, MD        No Known Allergies  Review of Systems negative except from HPI and PMH  Physical Exam BP 95/39  Pulse 88  Temp(Src) 98.8 F (37.1 C) (Axillary)  Resp 12  Ht $R'5\' 4"'eb$  (1.626 m)  Wt 206 lb 12.7 oz (93.8 kg)  BMI 35.48 kg/m2  SpO2 100% Well developed and well nourished in no acute distress HENT normal E scleral and icterus clear Neck Supple JVP undiscernible Clear to ausculation  Regular rate and rhythm, no murmurs gallops or rub Soft with active bowel sounds No clubbing cyanosis no Edema  L leg enlargement Alert and oriented, grossly normal motor and sensory function Skin Warm and Dry  ECG  TWI 1,L v3-V6 nwe Possible old IMI    Assessment and  Plan  +troponin  ? Source   DDx included PE, TakoTsubo, NSTEMI  LV dysfunction ? Tako Tsubo  Hx of metastatic breast Ca   ECG abnormal but doesn't help in the DDX   Will continue heparin and aspirin and BB/ACE  Venous dopplers with L Leg enlargement

## 2013-10-23 ENCOUNTER — Ambulatory Visit: Payer: PRIVATE HEALTH INSURANCE

## 2013-10-23 ENCOUNTER — Other Ambulatory Visit: Payer: PRIVATE HEALTH INSURANCE

## 2013-10-23 ENCOUNTER — Ambulatory Visit: Payer: PRIVATE HEALTH INSURANCE | Admitting: Physician Assistant

## 2013-10-23 DIAGNOSIS — D649 Anemia, unspecified: Secondary | ICD-10-CM

## 2013-10-23 LAB — CBC
HEMATOCRIT: 24 % — AB (ref 36.0–46.0)
Hemoglobin: 7.9 g/dL — ABNORMAL LOW (ref 12.0–15.0)
MCH: 29.7 pg (ref 26.0–34.0)
MCHC: 32.9 g/dL (ref 30.0–36.0)
MCV: 90.2 fL (ref 78.0–100.0)
Platelets: 176 10*3/uL (ref 150–400)
RBC: 2.66 MIL/uL — ABNORMAL LOW (ref 3.87–5.11)
RDW: 15.6 % — ABNORMAL HIGH (ref 11.5–15.5)
WBC: 2.9 10*3/uL — ABNORMAL LOW (ref 4.0–10.5)

## 2013-10-23 LAB — BASIC METABOLIC PANEL
BUN: 35 mg/dL — AB (ref 6–23)
CALCIUM: 8.3 mg/dL — AB (ref 8.4–10.5)
CO2: 25 meq/L (ref 19–32)
CREATININE: 1.69 mg/dL — AB (ref 0.50–1.10)
Chloride: 90 mEq/L — ABNORMAL LOW (ref 96–112)
GFR calc Af Amer: 35 mL/min — ABNORMAL LOW (ref >90)
GFR calc non Af Amer: 30 mL/min — ABNORMAL LOW (ref >90)
GLUCOSE: 111 mg/dL — AB (ref 70–99)
Potassium: 5.6 mEq/L — ABNORMAL HIGH (ref 3.7–5.3)
Sodium: 124 mEq/L — ABNORMAL LOW (ref 137–147)

## 2013-10-23 LAB — PREPARE RBC (CROSSMATCH)

## 2013-10-23 LAB — HEPARIN LEVEL (UNFRACTIONATED): Heparin Unfractionated: 0.36 IU/mL (ref 0.30–0.70)

## 2013-10-23 MED ORDER — GI COCKTAIL ~~LOC~~
30.0000 mL | Freq: Once | ORAL | Status: AC
  Administered 2013-10-23: 30 mL via ORAL
  Filled 2013-10-23: qty 30

## 2013-10-23 MED ORDER — PANTOPRAZOLE SODIUM 40 MG PO TBEC
40.0000 mg | DELAYED_RELEASE_TABLET | Freq: Every day | ORAL | Status: DC
  Administered 2013-10-23 – 2013-10-30 (×8): 40 mg via ORAL
  Filled 2013-10-23 (×11): qty 1

## 2013-10-23 NOTE — Care Management Note (Addendum)
    Page 1 of 2   11/01/2013     12:52:08 PM   CARE MANAGEMENT NOTE 11/01/2013  Patient:  Jeanette Morris, Jeanette Morris   Account Number:  [id number]  Date Initiated:  10/21/2013  Documentation initiated by:  DAVIS,RHONDA  Subjective/Objective Assessment:   pt admitted due to shortness of breath found have positive cardiac markers and abnormal ecg.     Action/Plan:   from home has family near by. hx of breast Ca.   Anticipated DC Date:  11/01/2013   Anticipated DC Plan:  [place]  In-house referral  NA      DC Planning Services  CM consult      PAC Choice  NA   Choice offered to / List presented to:  C-3 Spouse        HH arranged  NA      Status of service:  Completed, signed off Medicare Important Message given?  NA - LOS <3 / Initial given by admissions (If response is "NO", the following Medicare IM given date fields will be blank) Date Medicare IM given:   Date Additional Medicare IM given:    Discharge Disposition:  [place]  Per UR Regulation:  Reviewed for med. necessity/level of care/duration of stay  If discussed at [place] of Stay Meetings, dates discussed:   10/24/2013  Nov 28, 2013  10/31/2013    Comments:  11/01/13 Tayveon Lombardo RN,BSN NCM [phone] D/C [facility].  10/31/13 Zyionna Pesce RN,BSN NCM [phone] MED STABLE FOR D/C.D/C [facility].  10/30/13 Linford Quintela RN,BSN NCM [phone] NOW D/C PLAN FOR [facility]. NOW FOR HOME W/HOSPICE SERVICES.SPOKE TO PATIENT/SPOUSE/FAMILY IN RM ABOUT HOME W/HOSPICE PROVIDERS-[facility] CHOSEN.MARGIE [facility] AWARE OF REFERRAL FAXED W/CONFIRMATION TO [phone] FACE SHEET,H&P,PALLIATIVE NOTE,MED REC.NOTED ON DILAUDID GTT CONTINUOUS.HAS CENTRAL LINE.TC [facility] DME REP [place]-AWARE OF DME-HOSPITAL BED,HOYER LIFT,OVERLAY GEL MATTRESS,W/C,OVERBED TABLE.ALREADY RECEIVES HOME 02 VIA [facility].CONTACT JESSE C#[phone].AMBULANCE TRANSP @ D/C.MD UPDATED.CSW NOTIFIED.PLAN FOR D/C TOMORROW.  10/28/13 Emre Stock  RN,BSN NCM [phone] CURRENTLY FULL CODE.PALLIATIVE CONS-GOC-SPOUSE PRESENT.PMT WILL ALSO TALK TO DTR ABOUT GOC.AWAIT RECOMMENDATIONS.CURRENT D/C PLAN HOME.  10/24/13 Vaishali Baise RN,BSN NCM [phone] PT-HH.SPOKE TO SPOUSE ABOUT HHC.HE PREFERS TO STAY W/OTPT REHAB FOR LYMPHAEDEMA.TC [facility] JUST TO CHECK-KRISTINE SAYS IT IS A LENGTHY PROCESS,& UNABLE TO PROVIDE SERVICE.PATIENT APPRECIATIVE OF ASSISTANCE.SPOUSE WILL CALL OTPT REHAB @ D/C TO SET UP APPT.NO FURTHER ANTICIPATED D/C NEEDS.  10/23/13 Danzell Birky RN,BSN NCM [phone] TRANSFER FROM SDU.HEP GTT,IV ABX.D/C [place], RN, BSN, [place] [phone] Chart Reviewed for discharge and hospital needs. Discharge needs at time of review:  None present will follow for needs. Review of patient progress due on [phone].

## 2013-10-23 NOTE — Progress Notes (Signed)
Subjective:  Mild epigastric pain perhaps changed with GI coctal. Still states that she is having some shortness of breath when laying flat., Orthopnea.  Echocardiogram  had the appearance of stress induced cardiomyopathy. EF in the 40% range.  Could not complete VQ scan. No adequate IV access for CT angiogram.  LE dopplers are negative.   Objective:  Vital Signs in the last 24 hours: Temp:  [98.1 F (36.7 C)-99.3 F (37.4 C)] 99.3 F (37.4 C) (02/25 0000) Pulse Rate:  [82-104] 102 (02/25 0800) Resp:  [10-19] 16 (02/25 0800) BP: (95-128)/(31-51) 115/49 mmHg (02/25 0800) SpO2:  [100 %] 100 % (02/25 0800) Weight:  [213 lb 10 oz (96.9 kg)] 213 lb 10 oz (96.9 kg) (02/25 0600)  Intake/Output from previous day: 02/24 0701 - 02/25 0700 In: 1598.7 [I.V.:722.5; IV Piggyback:821.2] Out: 180 [Urine:180]   Physical Exam: General: Well developed, well nourished, in no acute distress. Family at bedside Head:  Normocephalic and atraumatic. Lungs: Clear to auscultation and percussion. Heart: Normal S1 and S2.  No murmur, rubs or gallops. Difficult to assess JVD Abdomen: soft, non-tender, positive bowel sounds. Obese Extremities: No clubbing or cyanosis.  Minor lower extremity edema. Ace wrap right arm lymphedema Neurologic: Alert and oriented x 3.    Lab Results:  Recent Labs  10/22/13 0515 10/23/13 0635  WBC 3.7* 2.9*  HGB 8.2* 7.9*  PLT 185 176    Recent Labs  10/22/13 0735 10/23/13 0635  NA 125* 124*  K 5.2 5.6*  CL 89* 90*  CO2 26 25  GLUCOSE 119* 111*  BUN 29* 35*  CREATININE 1.82* 1.69*    Recent Labs  10/20/13 0930  TROPONINI 1.59*   Hepatic Function Panel No results found for this basename: PROT, ALBUMIN, AST, ALT, ALKPHOS, BILITOT, BILIDIR, IBILI,  in the last 72 hours No results found for this basename: CHOL,  in the last 72 hours Telemetry: Sinus rhythm with frequent PACs, bigeminy pattern. Heart rate has remained in the 90- 100 range. Personally  viewed.   EKG:  Sinus rhythm with T wave inversion diffusely.  Cardiac Studies:  Echocardiogram 10/20/13 - Left ventricle: The cavity size was normal. There was moderate concentric hypertrophy. Systolic function was mildly to moderately reduced. The estimated ejection fraction was in the range of 40% to 45%. There is severe hypokinesis of the distal anteroseptal, anterolateral, inferolateral, inferoseptal, and apical myocardium. Has appearance of stress induced cardiomyopathy, Takotsubo. Doppler parameters are consistent with abnormal left ventricular relaxation (grade 1 diastolic dysfunction). - Pulmonary arteries: Systolic pressure was mildly to moderately increased. PA peak pressure: 58mm Hg (S).  CT scan of chest 07/22/13 Diffuse, dense calcification of the LAD. Personally viewed.  Assessment/Plan:  Active Problems:   Dyspnea   ACS (acute coronary syndrome)  68 year old with metastatic breast cancer abnormal EKG, elevated troponin, cardiomyopathy, acute on chronic systolic heart failure.  1. Elevated troponin- Current objective evidence demonstrates akinesis of the apex, distal segments of her myocardium pointing [place] in the direction of cardiomyopathy, perhaps stress induced cardiomyopathy. T-wave inversion on EKG diffusely with mildly elevated troponin points [place] in this direction.  Unfortunately, could not complete studies to evaluate for pulmonary embolism although this is a distinct possibility given her hypercoagulable state. Her right ventricle did not appear to be stunned or significantly dilated reassuringly.   ACS is a possibility as well with her diffuse, dense LAD calcifications noted on prior CT scan. Noninvasive candidate.   Given the evidence that we have of worsening cardiomyopathy,  I think that we should tailor treatment in this direction.   Spoke with Dr. Coralyn Pear. He will discuss with oncology as well. May continue empiric treatment for PE with initiation of  coumadin.   Continue  IV Lasix to 80 mg twice a day for treatment of congestion especially if PRBC are given. BNP was mildly elevated. She states again this morning she may be breathing slightly better but still has orthopnea.  Continue with carvedilol 6.25 mg twice a day.  I will add low dose lisinopril 5 mg once a day for afterload reduction.  2) Anemia - transfuse 3) Stage 4 breast CA.   I'm comfortable with transfer to telemetry floor.  SKAINS, [place] 10/23/2013, 8:20 AM

## 2013-10-23 NOTE — Progress Notes (Signed)
Order to place midline. Patient not viable candidate for IV Team Midline placement due to gross lymphedema to bilateral extremities. Please order IR placed Line.

## 2013-10-23 NOTE — Progress Notes (Signed)
ANTICOAGULATION CONSULT NOTE - Follow Up Consult  Pharmacy Consult for Heparin Indication: ACS, rule out PE  No Known Allergies  Patient Measurements: Height: 5\' 4"  (162.6 cm) Weight: 213 lb 10 oz (96.9 kg) IBW/kg (Calculated) : 54.7 Heparin Dosing Weight: 77 kg  Vital Signs: Temp: 99.3 F (37.4 C) (02/25 0000) Temp src: Oral (02/25 0000) BP: 106/41 mmHg (02/25 0400) Pulse Rate: 97 (02/25 0700)  Labs:  Recent Labs  10/20/13 0930 10/21/13 0530  10/21/13 0645 10/21/13 1145 10/22/13 0515 10/22/13 0735 10/23/13 0635  HGB  --   --   < > 9.1*  --  8.2*  --  7.9*  HCT  --   --   --  28.7*  --  25.3*  --  24.0*  PLT  --   --   --  176  --  185  --  176  HEPARINUNFRC 0.55 0.59  --   --   --   --  0.36 0.36  CREATININE  --   --   --   --  1.22*  --  1.82* 1.69*  TROPONINI 1.59*  --   --   --   --   --   --   --   < > = values in this interval not displayed.  Estimated Creatinine Clearance: 35.5 ml/min (by C-G formula based on Cr of 1.69).   Medications:  Scheduled:  . acyclovir  5 mg/kg (Adjusted) Intravenous 3 times per day  . antiseptic oral rinse  15 mL Mouth Rinse BID  . aspirin EC  81 mg Oral Daily  . atorvastatin  20 mg Oral q1800  . carvedilol  6.25 mg Oral BID WC  . clindamycin  300 mg Oral TID  . cyanocobalamin  500 mcg Oral Q breakfast  . dronabinol  2.5 mg Oral BID AC  . ferrous sulfate  325 mg Oral Q breakfast  . furosemide  80 mg Intravenous BID  . gabapentin  300 mg Oral QHS  . lisinopril  5 mg Oral Daily  . OxyCODONE  40 mg Oral Q12H  . polyethylene glycol  17 g Oral Daily   Infusions:  . sodium chloride 10 mL/hr at 10/23/13 0603  . heparin 1,050 Units/hr (10/23/13 8315)    Assessment: 68 yo female with metastatic breast cancer curently on radiation and chemotherapy. Admitted 2/21 with shortness of breath. IV heparin started for presumed PE (unable to get VQ scan) and elevated troponins. Doppler neg for DVT 2/24.  Heparin level remains in the  therapeutic range on 1050 units/hr - levels being obtained via foot stick to to arm lymphedema so true levels may actually be higher.  H/H cont to trend down slowly. Pltc stable.  No bleeding reported per RN.  Goal of Therapy:  Heparin level 0.3-0.7 units/ml Monitor platelets by anticoagulation protocol: Yes   Plan:   Continue heparin 1050 units/hr  Daily heparin level & CBC.  What is planned duration of therapy?  Peggyann Juba, PharmD, BCPS Pager: [phone] 10/23/2013,7:25 AM.

## 2013-10-23 NOTE — Progress Notes (Addendum)
[facility] PROGRESS NOTE  Jeanette Morris KGU:[id number] DOB: Jan 22, 1946 DOA: 10/19/2013 PCP: [name], NP  Assessment/Plan: 1. Acute combined systolic and diastolic congestive heart failure. Patient presenting with dyspnea and exertion, with repeat transthoracic echocardiogram showing an EF of 40-45% with grade 1 diastolic dysfunction. Lower EF compared to prior transthoracic echocardiogram on 04/18/2014 at which time she had an EF of 55-60%. Cardiology consulted. Will continue  Lasix 80 mg IV twice daily for today as blood transfusion has been ordered. Continue monitoring daily weights, strict ins and outs. 2. Elevated troponins. 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 Other possibilities include non-ST segment elevation myocardial infarction. Discussed with cardiology, she is not a candidate for cath. Given Hg of 7.9 and complaints of chest discomfort, will transfuse 1 unit of PRBC's.  She remains on IV heparin, beta blocker, ACE inhibitor, statin. Cardiology following. 3. Possible Pulmonary Embolism. I spoke with Dr Jana Hakim of medical oncology. He recommended pursuing CT scan of lungs if midline can be obtained and kidney function permits. Meanwhile she remains anticoagulated with IV heparin.   4. Extremity edema. Venous Dopplers obtained on 10/22/2013 did not reveal evidence of DVT. 5. Acute kidney injury, likely secondary to diuretic therapy administered during this hospitalization. Her creatinine slightly improved to 1.69 from 1.82 on 10/13/13.  6. Hyponatremia. Suspect secondary to high bulimic state in setting of congestive heart failure. She is on Lasix 80 mg IV twice a day. 7. Rash. Patient having erythematous rash to left shoulder, of unclear etiology. She was started on clindamycin for possible cellulitis. She is on Acyclovir given the possibility of shingles. This could be related to radiation therapy as well. Will continue to  monitor. 8. DVT prophylaxis. On IV heparin  Code Status: Full code Family Communication: I spoke with patient's daughter present at bedside Disposition Plan:  Plan to transfuse 1 unit of PRBC's, transfer to telemetry, place midline for blood transfusion. If creatinine continue to improve perhaps a CT scan with IV contrast could be done to assess for PE.     Consultants:  Cardiology  Procedures:  Transthoracic echocardiogram performed on 10/20/2013 showing ejection fraction of 40-45%, severe hypokinesis of the distal anteroseptal, anterolateral, inferolateral, inferoseptal and apical myocardium.  Antibiotics:  Acyclovir  Clindamycin  HPI/Subjective: Patient is a pleasant 68 year old female with a past medical history of metastatic breast cancer that was diagnosed in 2011, status post chemoradiation therapy, history of chronic diastolic congestive heart failure with transthoracic echocardiogram performed on 04/18/2013 showing ejection fraction of 55-60%, with grade 1 diastolic dysfunction. She presented on 10/19/2013 with complaints of shortness of breath. Initial chest x-ray showed lungs mildly hypoexpanded but grossly clear. She was admitted to the medicine service. Initial troponin elevated at 0.54 which trended up to a peak level of 2.52. She was seen and evaluated by cardiology, started on a heparin drip. Repeat transthoracic echocardiogram on 10/20/2013 showing a depressed ejection fraction of 40-45% based on prior 2-D echo with severe hypokinesis of the distal anteroseptal, anterolateral, inferolateral, inferior septal and apical myocardium. Appearance may be consistent with Takosubo. She remains on IV Lasix 80 mg twice a day, lisinopril 5 mg by mouth daily and carvedilol 6.25 mg by mouth twice a day. On 10/22/13 lab work showed a Hg of 7.9. Since she had also complained of chest pain, she was typed and crossed as 1 unit of PRBC's were ordered.    Objective: Filed Vitals:   10/23/13  1400  BP: 123/54  Pulse: 94  Temp:  Resp: 11    Intake/Output Summary (Last 24 hours) at 10/23/13 1515 Last data filed at 10/23/13 1416  Gross per 24 hour  Intake 1498.2 ml  Output    300 ml  Net 1198.2 ml   Filed Weights   10/21/13 0400 10/22/13 0400 10/23/13 0600  Weight: 94.2 kg (207 lb 10.8 oz) 93.8 kg (206 lb 12.7 oz) 96.9 kg (213 lb 10 oz)    Exam:  General: Pt is alert, follows commands appropriately, not in acute distress  Skin: left shoulder macular rash with some papules, TTP and warm to touch  Cardiovascular: Regular rate and rhythm, S1/S2, no murmurs, no rubs, no gallops  Respiratory: Clear to auscultation bilaterally, no wheezing, no crackles, no rhonchi  Abdomen: Soft, non tender, non distended, bowel sounds present, no guarding  Extremities: trace bilateral pitting LE edema, pulses DP and PT palpable bilaterally  Neuro: Grossly nonfocal   Data Reviewed: Basic Metabolic Panel:  Recent Labs Lab 10/19/13 0511 10/19/13 0522 10/19/13 1759 10/20/13 0400 10/21/13 1145 10/22/13 0735 10/23/13 0635  NA 129* 134*  --  131* 131* 125* 124*  K 5.2 5.0  --  5.2 4.9 5.2 5.6*  CL 94* 98  --  96 93* 89* 90*  CO2 24  --   --  25 29 26 25   GLUCOSE 124* 121*  --  109* 130* 119* 111*  BUN 14 14  --  16 21 29* 35*  CREATININE 0.89 1.00  --  0.88 1.22* 1.82* 1.69*  CALCIUM 8.1*  --   --  7.7* 8.7 8.3* 8.3*  MG  --   --  1.6  --   --   --   --    Liver Function Tests:  Recent Labs Lab 10/19/13 0511  AST 40*  ALT 14  ALKPHOS 76  BILITOT 0.7  PROT 6.7  ALBUMIN 2.7*   No results found for this basename: LIPASE, AMYLASE,  in the last 168 hours No results found for this basename: AMMONIA,  in the last 168 hours CBC:  Recent Labs Lab 10/19/13 0511 10/19/13 0522 10/20/13 0400 10/21/13 0645 10/22/13 0515 10/23/13 0635  WBC 10.2  --  5.9 4.8 3.7* 2.9*  HGB 10.4* 11.6* 9.5* 9.1* 8.2* 7.9*  HCT 32.5* 34.0* 29.0* 28.7* 25.3* 24.0*  MCV 91.8  --  90.3 91.7  92.3 90.2  PLT 200  --  196 176 185 176   Cardiac Enzymes:  Recent Labs Lab 10/19/13 0511 10/19/13 0754 10/20/13 0930  TROPONINI 0.54* 2.52* 1.59*   BNP (last 3 results)  Recent Labs  08/03/13 2100 10/19/13 0510 10/19/13 1759  PROBNP 612.9* 661.2* 666.3*   CBG: No results found for this basename: GLUCAP,  in the last 168 hours  Recent Results (from the past 240 hour(s))  MRSA PCR SCREENING     Status: None   Collection Time    10/19/13  5:18 PM      Result Value Ref Range Status   MRSA by PCR NEGATIVE  NEGATIVE Final   Comment:            The GeneXpert MRSA Assay (FDA     approved for NASAL specimens     only), is one component of a     comprehensive MRSA colonization     surveillance program. It is not     intended to diagnose MRSA     infection nor to guide or     monitor treatment for  MRSA infections.     Studies: No results found.  Scheduled Meds: . acyclovir  5 mg/kg (Adjusted) Intravenous 3 times per day  . antiseptic oral rinse  15 mL Mouth Rinse BID  . aspirin EC  81 mg Oral Daily  . atorvastatin  20 mg Oral q1800  . carvedilol  6.25 mg Oral BID WC  . clindamycin  300 mg Oral TID  . cyanocobalamin  500 mcg Oral Q breakfast  . dronabinol  2.5 mg Oral BID AC  . ferrous sulfate  325 mg Oral Q breakfast  . furosemide  80 mg Intravenous BID  . gabapentin  300 mg Oral QHS  . lisinopril  5 mg Oral Daily  . OxyCODONE  40 mg Oral Q12H  . pantoprazole  40 mg Oral Daily  . polyethylene glycol  17 g Oral Daily   Continuous Infusions: . sodium chloride 10 mL/hr at 10/23/13 0603  . heparin 1,050 Units/hr (10/23/13 [id number])    Active Problems:   Dyspnea   ACS (acute coronary syndrome)    Time spent: 97 min    Kelvin Cellar  [facility] Pager [phone]. If 7PM-7AM, please contact night-coverage at [email or web address], password [facility] 10/23/2013, 3:15 PM  LOS: 4 days

## 2013-10-24 ENCOUNTER — Ambulatory Visit: Payer: PRIVATE HEALTH INSURANCE

## 2013-10-24 DIAGNOSIS — C773 Secondary and unspecified malignant neoplasm of axilla and upper limb lymph nodes: Secondary | ICD-10-CM

## 2013-10-24 DIAGNOSIS — F29 Unspecified psychosis not due to a substance or known physiological condition: Secondary | ICD-10-CM

## 2013-10-24 DIAGNOSIS — C50919 Malignant neoplasm of unspecified site of unspecified female breast: Secondary | ICD-10-CM

## 2013-10-24 DIAGNOSIS — E875 Hyperkalemia: Secondary | ICD-10-CM

## 2013-10-24 LAB — TYPE AND SCREEN
ABO/RH(D): A POS
ANTIBODY SCREEN: NEGATIVE
Unit division: 0

## 2013-10-24 LAB — BASIC METABOLIC PANEL
BUN: 32 mg/dL — ABNORMAL HIGH (ref 6–23)
CALCIUM: 8.7 mg/dL (ref 8.4–10.5)
CO2: 27 mEq/L (ref 19–32)
Chloride: 90 mEq/L — ABNORMAL LOW (ref 96–112)
Creatinine, Ser: 1.37 mg/dL — ABNORMAL HIGH (ref 0.50–1.10)
GFR calc non Af Amer: 38 mL/min — ABNORMAL LOW (ref >90)
GFR, EST AFRICAN AMERICAN: 44 mL/min — AB (ref >90)
Glucose, Bld: 105 mg/dL — ABNORMAL HIGH (ref 70–99)
POTASSIUM: 5.7 meq/L — AB (ref 3.7–5.3)
SODIUM: 126 meq/L — AB (ref 137–147)

## 2013-10-24 LAB — CBC
HEMATOCRIT: 26.5 % — AB (ref 36.0–46.0)
Hemoglobin: 8.7 g/dL — ABNORMAL LOW (ref 12.0–15.0)
MCH: 29.7 pg (ref 26.0–34.0)
MCHC: 32.8 g/dL (ref 30.0–36.0)
MCV: 90.4 fL (ref 78.0–100.0)
Platelets: 173 10*3/uL (ref 150–400)
RBC: 2.93 MIL/uL — ABNORMAL LOW (ref 3.87–5.11)
RDW: 14.9 % (ref 11.5–15.5)
WBC: 4.8 10*3/uL (ref 4.0–10.5)

## 2013-10-24 LAB — HEPARIN LEVEL (UNFRACTIONATED)
Heparin Unfractionated: 0.15 IU/mL — ABNORMAL LOW (ref 0.30–0.70)
Heparin Unfractionated: 0.45 IU/mL (ref 0.30–0.70)

## 2013-10-24 MED ORDER — LACTULOSE 10 GM/15ML PO SOLN
10.0000 g | Freq: Every day | ORAL | Status: DC | PRN
Start: 2013-10-24 — End: 2013-10-30
  Filled 2013-10-24: qty 15

## 2013-10-24 MED ORDER — DOCUSATE SODIUM 100 MG PO CAPS
200.0000 mg | ORAL_CAPSULE | Freq: Two times a day (BID) | ORAL | Status: DC
  Administered 2013-10-24 – 2013-10-30 (×13): 200 mg via ORAL
  Filled 2013-10-24 (×18): qty 2

## 2013-10-24 MED ORDER — HEPARIN (PORCINE) IN NACL 100-0.45 UNIT/ML-% IJ SOLN
1300.0000 [IU]/h | INTRAMUSCULAR | Status: DC
  Administered 2013-10-24: 1300 [IU]/h via INTRAVENOUS
  Filled 2013-10-24: qty 250

## 2013-10-24 MED ORDER — GI COCKTAIL ~~LOC~~
30.0000 mL | Freq: Once | ORAL | Status: AC
  Administered 2013-10-24: 30 mL via ORAL
  Filled 2013-10-24: qty 30

## 2013-10-24 MED ORDER — SODIUM POLYSTYRENE SULFONATE 15 GM/60ML PO SUSP
30.0000 g | Freq: Once | ORAL | Status: AC
  Administered 2013-10-24: 30 g via ORAL
  Filled 2013-10-24: qty 120

## 2013-10-24 NOTE — Progress Notes (Signed)
ANTICOAGULATION CONSULT NOTE - Follow Up Consult  Pharmacy Consult for Heparin Indication: ACS, rule out PE  No Known Allergies  Patient Measurements: Height: 5\' 4"  (162.6 cm) Weight: 209 lb 8 oz (95.029 kg) IBW/kg (Calculated) : 54.7 Heparin Dosing Weight: 77 kg  Vital Signs: Temp: 98.2 F (36.8 C) (02/26 0529) Temp src: Oral (02/26 0529) BP: 114/45 mmHg (02/26 0529) Pulse Rate: 92 (02/26 0529)  Labs:  Recent Labs  10/22/13 0515 10/22/13 0735 10/23/13 0635 10/24/13 0510  HGB 8.2*  --  7.9* 8.7*  HCT 25.3*  --  24.0* 26.5*  PLT 185  --  176 173  HEPARINUNFRC  --  0.36 0.36 0.15*  CREATININE  --  1.82* 1.69* 1.37*    Estimated Creatinine Clearance: 43.3 ml/min (by C-G formula based on Cr of 1.37).   Medications:  Scheduled:  . acyclovir  5 mg/kg (Adjusted) Intravenous 3 times per day  . antiseptic oral rinse  15 mL Mouth Rinse BID  . aspirin EC  81 mg Oral Daily  . atorvastatin  20 mg Oral q1800  . carvedilol  6.25 mg Oral BID WC  . clindamycin  300 mg Oral TID  . cyanocobalamin  500 mcg Oral Q breakfast  . dronabinol  2.5 mg Oral BID AC  . ferrous sulfate  325 mg Oral Q breakfast  . furosemide  80 mg Intravenous BID  . gabapentin  300 mg Oral QHS  . lisinopril  5 mg Oral Daily  . OxyCODONE  40 mg Oral Q12H  . pantoprazole  40 mg Oral Daily  . polyethylene glycol  17 g Oral Daily   Infusions:  . sodium chloride 10 mL/hr at 10/23/13 0603  . heparin 1,050 Units/hr (10/24/13 0431)    Assessment: 68 yo female with metastatic breast cancer curently on radiation and chemotherapy. Admitted 2/21 with shortness of breath. IV heparin started for presumed PE (unable to get VQ scan) and elevated troponins. Doppler neg for DVT 2/24.  Heparin level subtherapeutic on 1050 units/hr - levels being obtained via foot stick due to arm lymphedema.  No interruptions in infusion per RN report.    Hgb improved after 1 unit PRBCs 2/25. Pltc stable.  No bleeding reported per  RN.  Goal of Therapy:  Heparin level 0.3-0.7 units/ml Monitor platelets by anticoagulation protocol: Yes   Plan:   Increase heparin infusion to 1300 units/hr  Repeat heparin level at 2:30 pm.  Daily heparin level & CBC.  Await further word on planned duration of therapy.  Clayburn Pert, PharmD, BCPS Pager: [phone] 10/24/2013  7:39 AM

## 2013-10-24 NOTE — Progress Notes (Signed)
Pt c/o's of chest pain even after receiving her morphine. She can not describe the pain and there is no nausea with it. Her v/s remain pretty much the same. Midlevel notified.Will cont to monitor.

## 2013-10-24 NOTE — Progress Notes (Signed)
Jeanette Morris   DOB:1946-02-17   OE#:[id number]   HWE#:[id number]  Subjective: Jeanette Morris was asleep and hard to arouse this AM; she knew me and location, but not date; she remained a bit confused even after fully alert; tells me she had "a little pain" in the middle of her chest (she is on protonix); she really couldn't tell me if her breathing was better or not; tells me she "needs a bowel movement"; no family in room   Objective: middle aged African American woman examined in bed Filed Vitals:   10/24/13 0529  BP: 114/45  Pulse: 92  Temp: 98.2 F (36.8 C)  Resp: 18    Body mass index is 35.94 kg/(m^2).  Intake/Output Summary (Last 24 hours) at 10/24/13 0840 Last data filed at 10/24/13 8938  Gross per 24 hour  Intake    336 ml  Output   2780 ml  Net  -2444 ml     Sclerae unicteric  No peripheral adenopathy  Lungs auscultated anterolaterally-- no wheezes or rubs  Heart regular rate and rhythm  Abdomen obese, NT, +BS  Neuro nonfocal  Breast exam: deferred  CBG (last 3)  No results found for this basename: GLUCAP,  in the last 72 hours   Labs:  Lab Results  Component Value Date   WBC 4.8 10/24/2013   HGB 8.7* 10/24/2013   HCT 26.5* 10/24/2013   MCV 90.4 10/24/2013   PLT 173 10/24/2013   NEUTROABS 14.7* 10/16/2013    '@LASTCHEMISTRY'$ @  Urine Studies No results found for this basename: UACOL, UAPR, USPG, UPH, UTP, UGL, UKET, UBIL, UHGB, UNIT, UROB, ULEU, UEPI, UWBC, URBC, UBAC, CAST, CRYS, UCOM, BILUA,  in the last 72 hours  Basic Metabolic Panel:  Recent Labs Lab 10/19/13 1759 10/20/13 0400 10/21/13 1145 10/22/13 0735 10/23/13 0635 10/24/13 0510  NA  --  131* 131* 125* 124* 126*  K  --  5.2 4.9 5.2 5.6* 5.7*  CL  --  96 93* 89* 90* 90*  CO2  --  $R'25 29 26 25 27  'WT$ GLUCOSE  --  109* 130* 119* 111* 105*  BUN  --  16 21 29* 35* 32*  CREATININE  --  0.88 1.22* 1.82* 1.69* 1.37*  CALCIUM  --  7.7* 8.7 8.3* 8.3* 8.7  MG 1.6  --   --   --   --   --    GFR Estimated  Creatinine Clearance: 43.3 ml/min (by C-G formula based on Cr of 1.37). Liver Function Tests:  Recent Labs Lab 10/19/13 0511  AST 40*  ALT 14  ALKPHOS 76  BILITOT 0.7  PROT 6.7  ALBUMIN 2.7*   No results found for this basename: LIPASE, AMYLASE,  in the last 168 hours No results found for this basename: AMMONIA,  in the last 168 hours Coagulation profile  Recent Labs Lab 10/19/13 1023  INR 1.07    CBC:  Recent Labs Lab 10/20/13 0400 10/21/13 0645 10/22/13 0515 10/23/13 0635 10/24/13 0510  WBC 5.9 4.8 3.7* 2.9* 4.8  HGB 9.5* 9.1* 8.2* 7.9* 8.7*  HCT 29.0* 28.7* 25.3* 24.0* 26.5*  MCV 90.3 91.7 92.3 90.2 90.4  PLT 196 176 185 176 173   Cardiac Enzymes:  Recent Labs Lab 10/19/13 0511 10/19/13 0754 10/20/13 0930  TROPONINI 0.54* 2.52* 1.59*   BNP: No components found with this basename: POCBNP,  CBG: No results found for this basename: GLUCAP,  in the last 168 hours D-Dimer No results found for this basename: DDIMER,  in  the last 72 hours Hgb A1c No results found for this basename: HGBA1C,  in the last 72 hours Lipid Profile No results found for this basename: CHOL, HDL, LDLCALC, TRIG, CHOLHDL, LDLDIRECT,  in the last 72 hours Thyroid function studies No results found for this basename: TSH, T4TOTAL, FREET3, T3FREE, THYROIDAB,  in the last 72 hours Anemia work up No results found for this basename: VITAMINB12, FOLATE, FERRITIN, TIBC, IRON, RETICCTPCT,  in the last 72 hours Microbiology Recent Results (from the past 240 hour(s))  MRSA PCR SCREENING     Status: None   Collection Time    10/19/13  5:18 PM      Result Value Ref Range Status   MRSA by PCR NEGATIVE  NEGATIVE Final   Comment:            The GeneXpert MRSA Assay (FDA     approved for NASAL specimens     only), is one component of a     comprehensive MRSA colonization     surveillance program. It is not     intended to diagnose MRSA     infection nor to guide or     monitor treatment for      MRSA infections.      Studies:  No results found.  Assessment: 68 y.o. 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  (2) neoadjuvant chemotherapy consisted of 6 cycles of standard carboplatin, docetaxel, trastuzumab, with the trastuzumab held cycles 5 and 6 due to a drop in her borderline ejection fraction (from 45-50% at baseline to 40-45% January 2012)-- total trastuzumab treatment = 2 months  (3) status post right mastectomy and axillary lymph node dissection 12/01/2010, showing no residual invasive carcinoma in the breast, but residual tumor in 20 of 21 axillary lymph nodes. (Only one of the 20 positive lymph nodes showed a significant residual tumor, measuring 5 mm; the remaining lymph nodes, including the evidence of extracapsular extension, showed single and clusters a few neoplastic cells with extensive therapy related changes).  (4) postmastectomy radiation to the right chest wall, axilla, and supraclavicular region completed 03/07/2011  (5) additional 2 months of trastuzumab given February through April 2013, again discontinued because of a drop in the ejection fraction and lateral S'  (6) on letrozole July 2012 to September 2013  (7) LEFT axillary adenopathy noted by Dr. Marlou Starks on exam July 2013, with biopsy of a left axillary node 04/17/2012 showing an invasive ductal carcinoma, estrogen receptor 91% positive, progesterone receptor 9% positive, with an MIB-1 of 45%, and no HER-2 amplification.  (8) PET scan 04/03/2012 showed multiple enlarged left axillary and small left supraclavicular lymph nodes that are moderately hypermetabolic, with some hypermetabolic activity within the left axillary  tail. No other hypermetabolic lymph nodes identified. There was no involvement of the right axilla, mediastinum or right chest wall and no extrathoracic metastases were demonstrated.  (8) on exemestane and everolimus September through December 2013  (9) s/p RIGHT thoracentesis 08/01/2012, cytologically positive; s/p temporary Pleurx; effusion resolved after Abraxane therapy  (10) Abraxane started 08/16/2012, given day 1 and day 8 of each 21 day cycle, completed 6 cycles (12 doses) 12/06/2012, with excellent response  (11) fulvestrant started 12/19/2012, stopped 04/11/2013 with progression  (  12) trastuzumab resumed 12/21/2011. We scheduled the doses 4 weeks apart to see if they caould be better tolerated. However echo 02/04/2013 again showed a drop in the EF and S', so Herceptin stopped (last dose 01/17/2013). Most recent echo, 04/18/2013, shows the ejection fraction to have recovered.  (13) status post left modified radical mastectomy 05/08/2013 for a pT4, pN2a [tumor extended over 15 cm, all 6 sampled lymph nodes were involved) invasive lobular breast cancer, grade 3, which was HER-2 negative. It was estrogen receptor positive at 72%, progesterone receptor negative. Posterior margin was broadly positive  (14) tamoxifen started 05/29/2013, discontinued December 2014 with progression  (15) post-mastectomy irradiation to left chest wall and left [place] fossa planned, beginning 07/09/2013  (16) new baseline staging scans including a chest CT and PET scan were obtained 07/02/2013 suggesting progression of disease, but that is when compared to previous scans in August 2013. However in December of 2014 there was obvious disease progression on the chest wall.  (17) Abraxane resumed 08/07/2013, with treatments day 1 and day 8 of every 21 day cycle, and Neulasta given day 9; with restaging after 2 full cycles showing a very favorable response -- currently DAY 9 CYCLE 4 (missed Day 8 dose) (18) zolendronate started  10/02/2013, to be repeated Q 6 weeks   Plan: Jeanette Morris is a bit oversedated and slightly confused this AM-- will d/c marinol and IV morphine, continue her home narcotics as written. She is constipated, have increased her bowel prophylaxis.  More importantly, concur with hospitalist's plan to try to have a PICC inserted so we can obtain a CT/angio--this likely will have to be done by IR.   Patient missed her day 8 cycle 4 dose of chemotherapy, and we will not make that one up. Her next dose will be due March 11 and she already has an appointment with [place] that day assuming her cardio/pulmonary problems have improved sufficiently she can resume her treatments.  Greatly appreciate your help to this patient! Please let me know if I can be offurhter help at this point.   Chauncey Cruel, MD 10/24/2013  8:40 AM

## 2013-10-24 NOTE — Progress Notes (Signed)
PHARMACY BRIEF NOTE - Anticoagulation  Consult for:  IV Heparin Indication:  ACS, rule out PE  With the infusion of 1300 units/hr, the Heparin level drawn at 16:20 today is reported as 0.45 units/ml.  This level is within the therapeutic range, 0.3-0.7 units/ml.  Mrs. Belzer's nurse states that no bleeding problems have been noted.  Plan:  Continue Heparin at the current infusion rate.  Next level with morning labs on 2/27.  BowmanPh. 10/24/2013 5:20 PM

## 2013-10-24 NOTE — Evaluation (Signed)
Physical Therapy Evaluation Patient Details Name: Jeanette Morris MRN: [id number] DOB: 1945-11-03 Today's Date: 10/24/2013 Time: 9629-5284 PT Time Calculation (min): 58 min  PT Assessment / Plan / Recommendation History of Present Illness  Pt is 68 yo female with metastatic breast cancer diagnosed in 2011 and curently on radiation and chemotherapy, last chemo one week ago, off Herceptin since 2014 due to decreased EF (per 2 D ECHO 09/2012 EF 40-45%) and most recent 2 D ECHO 03/2013 with EF 50-55% and grade I diastolic dysfunction, who presented to [facility] ED with main concern of several hours duration of progressively worsening shortness of breath and initially noted with exertion and now present at rest as well, pt explains she is unable to catch a breath. She denies chest pain, no specific abdominal or urinary concerns, no fevers, chills, no specific focal neurological symptoms, no sick contacts or exposures. In ED, pt noted to have elevated troponin at 2.52 and cardiology was consulted. ED doctor considered PE given pt's hypercoagulable state but CT was not done due to lymphedema. Empirically started on IV heparin in ED.   Clinical Impression  Pt actually mobilized better than expected today. Pt's spouse reports plan is to return home and that pt has been going to OP PT for lymphedema wraps. Pt will benefit from PT to address problems  Listed  To return to safe functional mobility/    PT Assessment  Patient needs continued PT services    Follow Up Recommendations  Home health PT;Outpatient PT (pt has been going to OP for lymphadema per spouse/)    Does the patient have the potential to tolerate intense rehabilitation      Barriers to Discharge        Equipment Recommendations  None recommended by PT (pt may need a WC)    Recommendations for Other Services     Frequency Min 3X/week    Precautions / Restrictions Precautions Precautions: Fall Precaution Comments:  Bil UE edema   Pertinent  Vitals/Pain Pt reports UE's are sore.      Mobility  Bed Mobility Overal bed mobility: Needs Assistance;+ 2 for safety/equipment;+2 for physical assistance Bed Mobility: Supine to Sit Supine to sit: +2 for physical assistance;+2 for safety/equipment;Max assist General bed mobility comments: frequent cues to initiate activity, pt made attempts to move legs, trunk support to upright. Transfers Overall transfer level: Needs assistance Equipment used: Rolling walker (2 wheeled);2 person hand held assist Transfers: Sit to/from [facility] Sit to Stand: +2 physical assistance;+2 safety/equipment;Mod assist;From elevated surface Stand pivot transfers: Mod assist;+2 physical assistance;+2 safety/equipment General transfer comment: pt stood from bed, pivoted to [facility] with cues to reach for armrest, support under arms,  Ambulation/Gait Ambulation/Gait assistance: +2 physical assistance;+2 safety/equipment;Mod assist Ambulation Distance (Feet): 5 Feet Assistive device: 1 person hand held assist Gait Pattern/deviations: Step-to pattern General Gait Details: pt able to take several steps, Therapist held under arrms while pt supported  on therapist forearms, Attempted RW but pt is unable to  maintain grip on RW w/ RUE    Exercises     PT Diagnosis: Difficulty walking;Generalized weakness  PT Problem List: Decreased strength;Decreased range of motion;Decreased activity tolerance;Decreased balance;Decreased mobility;Decreased safety awareness;Decreased knowledge of precautions;Decreased knowledge of use of DME;Pain PT Treatment Interventions: DME instruction;Gait training;Functional mobility training;Therapeutic activities;Therapeutic exercise;Patient/family education     PT Goals(Current goals can be found in the care plan section) Acute Rehab PT Goals Patient Stated Goal: I want to get to the [facility] PT Goal Formulation: With patient/family  Time For Goal Achievement:  11/07/13 Potential to Achieve Goals: Good  Visit Information  Last PT Received On: 10/24/13 Assistance Needed: +2 History of Present Illness: Pt is 68 yo female with metastatic breast cancer diagnosed in 2011 and curently on radiation and chemotherapy, last chemo one week ago, off Herceptin since 2014 due to decreased EF (per 2 D ECHO 09/2012 EF 40-45%) and most recent 2 D ECHO 03/2013 with EF 50-55% and grade I diastolic dysfunction, who presented to [facility] ED with main concern of several hours duration of progressively worsening shortness of breath and initially noted with exertion and now present at rest as well, pt explains she is unable to catch a breath. She denies chest pain, no specific abdominal or urinary concerns, no fevers, chills, no specific focal neurological symptoms, no sick contacts or exposures. In ED, pt noted to have elevated troponin at 2.52 and cardiology was consulted. ED doctor considered PE given pt's hypercoagulable state but CT was not done due to lymphedema. Empirically started on IV heparin in ED.        Prior [place] expects to be discharged to:: Private residence Living Arrangements: Spouse/significant other;Children Available Help at Discharge: Family Type of Home: House Home Access: Stairs to enter Technical brewer of Steps: 2 Entrance Stairs-Rails: None Home Layout: One level Home Equipment: Walker - 2 wheels Additional Comments: Pt sponge baths in bathroom on toilet by spouse or daughter. Pt does not use the shower due to current IV lines for CA treatment.Oxygen 24/7 2 liters.  Pt had been coming to outpatient cancer rehab for compression wrapping for right arm Prior Function Level of Independence: Needs assistance ADL's / Homemaking Assistance Needed: pt requires (A) with sponge bath and toileting at baseline.   Spouse eports that pt ambulates in house distances with O2 and RW  Cognition  Cognition Arousal/Alertness:  Awake/alert Behavior During Therapy: Agitated (when PT wanted to test pt's strength before trying to get to [facility] Overall Cognitive Status: History of cognitive impairments - at baseline per spouse.    Extremity/Trunk Assessment Upper Extremity Assessment Upper Extremity Assessment: RUE deficits/detail;LUE deficits/detail RUE Deficits / Details: significant  edema on UE, unable to functionally use to hold cup, able to keep hand on RW RUE Sensation: decreased light touch LUE Deficits / Details: edema present, able to hold  Cup but not keep khand on RW Lower Extremity Assessment Lower Extremity Assessment: Generalized weakness   Balance Balance Overall balance assessment: Needs assistance Sitting-balance support: Feet supported;No upper extremity supported Sitting balance-Leahy Scale: Poor Postural control: Posterior lean Standing balance support: Bilateral upper extremity supported;During functional activity Standing balance-Leahy Scale: Poor Standing balance comment: stood with bilateral UE hold as bottom wiped.  End of Session PT - End of Session Equipment Utilized During Treatment: Gait belt Activity Tolerance: Patient tolerated treatment well Patient left: in chair;with call bell/phone within reach;with chair alarm set Nurse Communication: Mobility status  GP     Claretha Cooper 10/24/2013, 3:10 PM  [phone]

## 2013-10-24 NOTE — Progress Notes (Signed)
Subjective:  Mild epigastric pain. Improved with cocktail.   Echocardiogram  had the appearance of stress induced cardiomyopathy. EF in the 40% range.  Could not complete VQ scan. No adequate IV access for CT angiogram.  LE dopplers are negative.   Objective:  Vital Signs in the last 24 hours: Temp:  [97.7 F (36.5 C)-98.6 F (37 C)] 98.2 F (36.8 C) (02/26 0529) Pulse Rate:  [84-110] 92 (02/26 0529) Resp:  [11-25] 18 (02/26 0529) BP: (92-126)/(39-64) 114/45 mmHg (02/26 0529) SpO2:  [97 %-100 %] 100 % (02/26 0529) Weight:  [209 lb 8 oz (95.029 kg)] 209 lb 8 oz (95.029 kg) (02/26 0600)  Intake/Output from previous day: 02/25 0701 - 02/26 0700 In: 356.5 [I.V.:237; Blood:12.5; IV Piggyback:107] Out: 2840 [Urine:2840]   Physical Exam: General: Well developed, well nourished, in no acute distress. Family at bedside Head:  Normocephalic and atraumatic. Lungs: Clear to auscultation and percussion. Heart: Normal S1 and S2.  No murmur, rubs or gallops. Difficult to assess JVD Abdomen: soft, non-tender, positive bowel sounds. Obese Extremities: No clubbing or cyanosis.  Minor lower extremity edema. Ace wrap right arm lymphedema Neurologic: Alert and oriented x 3.    Lab Results:  Recent Labs  10/23/13 0635 10/24/13 0510  WBC 2.9* 4.8  HGB 7.9* 8.7*  PLT 176 173    Recent Labs  10/23/13 0635 10/24/13 0510  NA 124* 126*  K 5.6* 5.7*  CL 90* 90*  CO2 25 27  GLUCOSE 111* 105*  BUN 35* 32*  CREATININE 1.69* 1.37*    Telemetry: Sinus rhythm with frequent PACs, bigeminy pattern. Heart rate has remained in the 90- 100 range. Personally viewed.   EKG:  Sinus rhythm with T wave inversion diffusely.  Cardiac Studies:  Echocardiogram 10/20/13 - Left ventricle: The cavity size was normal. There was moderate concentric hypertrophy. Systolic function was mildly to moderately reduced. The estimated ejection fraction was in the range of 40% to 45%. There is  severe hypokinesis of the distal anteroseptal, anterolateral, inferolateral, inferoseptal, and apical myocardium. Has appearance of stress induced cardiomyopathy, Takotsubo. Doppler parameters are consistent with abnormal left ventricular relaxation (grade 1 diastolic dysfunction). - Pulmonary arteries: Systolic pressure was mildly to moderately increased. PA peak pressure: 30mm Hg (S).  CT scan of chest 07/22/13 Diffuse, dense calcification of the LAD. Personally viewed.  Assessment/Plan:  Active Problems:   Dyspnea   ACS (acute coronary syndrome)  68 year old with metastatic breast cancer abnormal EKG, elevated troponin, cardiomyopathy, acute on chronic systolic heart failure.  1. Elevated troponin- Current objective evidence demonstrates akinesis of the apex, distal segments of her myocardium pointing [place] in the direction of cardiomyopathy, perhaps stress induced cardiomyopathy. T-wave inversion on EKG diffusely with mildly elevated troponin points [place] in this direction.  Unfortunately, could not complete studies to evaluate for pulmonary embolism although this is a distinct possibility given her hypercoagulable state. Her right ventricle did not appear to be stunned or significantly dilated reassuringly.   Dr. Coralyn Pear stated that he may be able to have a PICC line placed to obtain CT. He is looking into this. Reviewed in Dr. Virgie Dad note. Challenging given her lymphedema.   ACS is a possibility as well with her diffuse, dense LAD calcifications noted on prior CT scan. Noninvasive candidate.    May continue empiric treatment for PE with initiation of lovenox Q12, 1mg /kg.    Continue  IV Lasix to 80 mg twice a day for treatment of congestion especially if PRBC are  given. BNP was mildly elevated. She states again this morning she may be breathing slightly better.  Continue with carvedilol 6.25 mg twice a day.  Lisinopril 5 mg once a day for afterload reduction.  2) Anemia -  transfusion on 10/23/12  3) Stage 4 breast CA. Dr. Jana Hakim.  4) Hyperkalemia - This may be exacerbated by recent addition of lisinopril for cardiomyopathy. Lasix and BM should help to rectify this. If this continues to increase may need to DC lisinopril.    Jeanette Morris, [place] 10/24/2013, 9:31 AM

## 2013-10-24 NOTE — Progress Notes (Addendum)
[facility] PROGRESS NOTE  Jeanette Morris LKG:[id number] DOB: 04/27/1946 DOA: 10/19/2013 PCP: [name], NP  Assessment/Plan: 1. Acute combined systolic and diastolic congestive heart failure. Patient presenting with dyspnea and exertion, with repeat transthoracic echocardiogram showing an EF of 40-45% with grade 1 diastolic dysfunction. Lower EF compared to prior transthoracic echocardiogram on 04/18/2014 at which time she had an EF of 55-60%. Cardiology following. She had a net I/O of -2,483. Kidney function improved.  2. Elevated troponins. 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 Other possibilities include non-ST segment elevation myocardial infarction. Discussed with cardiology, she is not a candidate for cath. Given Hg of 7.9 and complaints of chest discomfort, was transfused with1 unit of PRBC's.  She remains on IV heparin, beta blocker, ACE inhibitor, statin. Cardiology following. 3. Possible Pulmonary Embolism. Case was discussed with cardiology. If she has a power port may be able to obtain a CT PE study. If not a PICC line may need to be placed for IV access. Kidney function improving. Hopefully this will provide a definitive answer regarding the need for anticoagulation. 4. Hyperkalemia. Patient's potassium trending up to 5.7. Will provide a dose of Kayexalate PO x 1 dose today, repeat BMP in AM. 5. Extremity edema. Venous Dopplers obtained on 10/22/2013 did not reveal evidence of DVT. 6. Acute kidney injury, improved on 10/24/13, coming down to 1.37  7. Hyponatremia. Suspect secondary to high bulimic state in setting of congestive heart failure. She is on Lasix 80 mg IV twice a day. 8. Rash. Patient having erythematous rash to left shoulder, of unclear etiology. She was started on clindamycin for possible cellulitis. She is on Acyclovir given the possibility of shingles. This could be related to radiation therapy as well. Will  continue to monitor. 9. DVT prophylaxis. On IV heparin  Code Status: Full code Family Communication: I spoke with patient's daughter present at bedside Disposition Plan:  Plan to transfuse 1 unit of PRBC's, transfer to telemetry, place midline for blood transfusion. If creatinine continue to improve perhaps a CT scan with IV contrast could be done to assess for PE.     Consultants:  Cardiology  Procedures:  Transthoracic echocardiogram performed on 10/20/2013 showing ejection fraction of 40-45%, severe hypokinesis of the distal anteroseptal, anterolateral, inferolateral, inferoseptal and apical myocardium.  Antibiotics:  Acyclovir  Clindamycin  HPI/Subjective: Patient is a pleasant 68 year old female with a past medical history of metastatic breast cancer that was diagnosed in 2011, status post chemoradiation therapy, history of chronic diastolic congestive heart failure with transthoracic echocardiogram performed on 04/18/2013 showing ejection fraction of 55-60%, with grade 1 diastolic dysfunction. She presented on 10/19/2013 with complaints of shortness of breath. Initial chest x-ray showed lungs mildly hypoexpanded but grossly clear. She was admitted to the medicine service. Initial troponin elevated at 0.54 which trended up to a peak level of 2.52. She was seen and evaluated by cardiology, started on a heparin drip. Repeat transthoracic echocardiogram on 10/20/2013 showing a depressed ejection fraction of 40-45% based on prior 2-D echo with severe hypokinesis of the distal anteroseptal, anterolateral, inferolateral, inferior septal and apical myocardium. Appearance may be consistent with Takosubo. She remains on IV Lasix 80 mg twice a day, lisinopril 5 mg by mouth daily and carvedilol 6.25 mg by mouth twice a day. On 10/22/13 lab work showed a Hg of 7.9. Since she had also complained of chest pain, she was typed and crossed as 1 unit of PRBC's were ordered. Her hemoglobin so improved to 8.7  by  2 26 2015. With regard to possibility of underlying pulmonary embolism I discussed case further with cardiology. In order to obtain a definitive answer PICC line to be placed or access could be obtained through port if it is a power port. Lab work on 10/24/2013 showing improvement to her creatinine as it has trended down to 1.37.  Objective: Filed Vitals:   10/24/13 0529  BP: 114/45  Pulse: 92  Temp: 98.2 F (36.8 C)  Resp: 18    Intake/Output Summary (Last 24 hours) at 10/24/13 1102 Last data filed at 10/24/13 0946  Gross per 24 hour  Intake  274.5 ml  Output   2950 ml  Net -2675.5 ml   Filed Weights   10/22/13 0400 10/23/13 0600 10/24/13 0600  Weight: 93.8 kg (206 lb 12.7 oz) 96.9 kg (213 lb 10 oz) 95.029 kg (209 lb 8 oz)    Exam:  General: Patient appears somewhat sedated, she is arousable though did answer questions. Skin: left shoulder macular rash with some papules, TTP and warm to touch  Cardiovascular: Regular rate and rhythm, 99991111, 2/6 systolic ejection murmur, no rubs, no gallops  Respiratory: Clear to auscultation bilaterally, no wheezing, no crackles, no rhonchi  Abdomen: Soft, non tender, non distended, bowel sounds present, no guarding  Extremities: trace bilateral pitting LE edema, pulses DP and PT palpable bilaterally  Neuro: Grossly nonfocal   Data Reviewed: Basic Metabolic Panel:  Recent Labs Lab 10/19/13 1759 10/20/13 0400 10/21/13 1145 10/22/13 0735 10/23/13 0635 10/24/13 0510  NA  --  131* 131* 125* 124* 126*  K  --  5.2 4.9 5.2 5.6* 5.7*  CL  --  96 93* 89* 90* 90*  CO2  --  25 29 26 25 27   GLUCOSE  --  109* 130* 119* 111* 105*  BUN  --  16 21 29* 35* 32*  CREATININE  --  0.88 1.22* 1.82* 1.69* 1.37*  CALCIUM  --  7.7* 8.7 8.3* 8.3* 8.7  MG 1.6  --   --   --   --   --    Liver Function Tests:  Recent Labs Lab 10/19/13 0511  AST 40*  ALT 14  ALKPHOS 76  BILITOT 0.7  PROT 6.7  ALBUMIN 2.7*   No results found for this  basename: LIPASE, AMYLASE,  in the last 168 hours No results found for this basename: AMMONIA,  in the last 168 hours CBC:  Recent Labs Lab 10/20/13 0400 10/21/13 0645 10/22/13 0515 10/23/13 0635 10/24/13 0510  WBC 5.9 4.8 3.7* 2.9* 4.8  HGB 9.5* 9.1* 8.2* 7.9* 8.7*  HCT 29.0* 28.7* 25.3* 24.0* 26.5*  MCV 90.3 91.7 92.3 90.2 90.4  PLT 196 176 185 176 173   Cardiac Enzymes:  Recent Labs Lab 10/19/13 0511 10/19/13 0754 10/20/13 0930  TROPONINI 0.54* 2.52* 1.59*   BNP (last 3 results)  Recent Labs  08/03/13 2100 10/19/13 0510 10/19/13 1759  PROBNP 612.9* 661.2* 666.3*   CBG: No results found for this basename: GLUCAP,  in the last 168 hours  Recent Results (from the past 240 hour(s))  MRSA PCR SCREENING     Status: None   Collection Time    10/19/13  5:18 PM      Result Value Ref Range Status   MRSA by PCR NEGATIVE  NEGATIVE Final   Comment:            The GeneXpert MRSA Assay (FDA     approved for NASAL specimens  only), is one component of a     comprehensive MRSA colonization     surveillance program. It is not     intended to diagnose MRSA     infection nor to guide or     monitor treatment for     MRSA infections.     Studies: No results found.  Scheduled Meds: . acyclovir  5 mg/kg (Adjusted) Intravenous 3 times per day  . antiseptic oral rinse  15 mL Mouth Rinse BID  . aspirin EC  81 mg Oral Daily  . atorvastatin  20 mg Oral q1800  . carvedilol  6.25 mg Oral BID WC  . clindamycin  300 mg Oral TID  . cyanocobalamin  500 mcg Oral Q breakfast  . docusate sodium  200 mg Oral BID  . ferrous sulfate  325 mg Oral Q breakfast  . furosemide  80 mg Intravenous BID  . gabapentin  300 mg Oral QHS  . lisinopril  5 mg Oral Daily  . OxyCODONE  40 mg Oral Q12H  . pantoprazole  40 mg Oral Daily  . polyethylene glycol  17 g Oral Daily   Continuous Infusions: . sodium chloride 10 mL/hr at 10/23/13 0603  . heparin 1,300 Units/hr (10/24/13 3646)     Active Problems:   Dyspnea   ACS (acute coronary syndrome)    Time spent: 58 min    Kelvin Cellar  [facility] Pager [phone]. If 7PM-7AM, please contact night-coverage at [email or web address], password [facility] 10/24/2013, 11:02 AM  LOS: 5 days

## 2013-10-25 DIAGNOSIS — I890 Lymphedema, not elsewhere classified: Secondary | ICD-10-CM

## 2013-10-25 DIAGNOSIS — T451X5A Adverse effect of antineoplastic and immunosuppressive drugs, initial encounter: Secondary | ICD-10-CM

## 2013-10-25 DIAGNOSIS — R5381 Other malaise: Secondary | ICD-10-CM | POA: Diagnosis present

## 2013-10-25 DIAGNOSIS — I5043 Acute on chronic combined systolic (congestive) and diastolic (congestive) heart failure: Secondary | ICD-10-CM | POA: Insufficient documentation

## 2013-10-25 DIAGNOSIS — E875 Hyperkalemia: Secondary | ICD-10-CM | POA: Diagnosis not present

## 2013-10-25 DIAGNOSIS — I214 Non-ST elevation (NSTEMI) myocardial infarction: Secondary | ICD-10-CM | POA: Diagnosis present

## 2013-10-25 LAB — BASIC METABOLIC PANEL
BUN: 32 mg/dL — AB (ref 6–23)
CHLORIDE: 88 meq/L — AB (ref 96–112)
CO2: 29 mEq/L (ref 19–32)
Calcium: 8.6 mg/dL (ref 8.4–10.5)
Creatinine, Ser: 1.32 mg/dL — ABNORMAL HIGH (ref 0.50–1.10)
GFR, EST AFRICAN AMERICAN: 47 mL/min — AB (ref >90)
GFR, EST NON AFRICAN AMERICAN: 40 mL/min — AB (ref >90)
Glucose, Bld: 104 mg/dL — ABNORMAL HIGH (ref 70–99)
Potassium: 4.7 mEq/L (ref 3.7–5.3)
SODIUM: 126 meq/L — AB (ref 137–147)

## 2013-10-25 LAB — CBC
HCT: 27.8 % — ABNORMAL LOW (ref 36.0–46.0)
Hemoglobin: 9.1 g/dL — ABNORMAL LOW (ref 12.0–15.0)
MCH: 29.6 pg (ref 26.0–34.0)
MCHC: 32.7 g/dL (ref 30.0–36.0)
MCV: 90.6 fL (ref 78.0–100.0)
PLATELETS: 167 10*3/uL (ref 150–400)
RBC: 3.07 MIL/uL — AB (ref 3.87–5.11)
RDW: 14.9 % (ref 11.5–15.5)
WBC: 5.3 10*3/uL (ref 4.0–10.5)

## 2013-10-25 LAB — HEPARIN LEVEL (UNFRACTIONATED): HEPARIN UNFRACTIONATED: 0.7 [IU]/mL (ref 0.30–0.70)

## 2013-10-25 LAB — RETICULOCYTES
RBC.: 3.07 MIL/uL — ABNORMAL LOW (ref 3.87–5.11)
RETIC COUNT ABSOLUTE: 12.3 10*3/uL — AB (ref 19.0–186.0)
RETIC CT PCT: 0.4 % (ref 0.4–3.1)

## 2013-10-25 MED ORDER — ENOXAPARIN SODIUM 100 MG/ML ~~LOC~~ SOLN
1.0000 mg/kg | Freq: Two times a day (BID) | SUBCUTANEOUS | Status: DC
  Administered 2013-10-25 – 2013-10-28 (×7): 95 mg via SUBCUTANEOUS
  Filled 2013-10-25 (×9): qty 1

## 2013-10-25 MED ORDER — MORPHINE SULFATE 2 MG/ML IJ SOLN
2.0000 mg | INTRAMUSCULAR | Status: DC | PRN
  Administered 2013-10-25 – 2013-10-27 (×4): 2 mg via INTRAVENOUS
  Filled 2013-10-25 (×5): qty 1

## 2013-10-25 MED ORDER — FUROSEMIDE 40 MG PO TABS
40.0000 mg | ORAL_TABLET | Freq: Two times a day (BID) | ORAL | Status: DC
  Administered 2013-10-25 – 2013-10-28 (×8): 40 mg via ORAL
  Filled 2013-10-25 (×11): qty 1

## 2013-10-25 NOTE — Progress Notes (Signed)
Subjective:  Better breathing. Still with same complaint of orthopnea that she has had since admit.   Echocardiogram  had the appearance of stress induced cardiomyopathy. EF in the 40% range.  Could not complete VQ scan. No adequate IV access for CT angiogram.  LE dopplers are negative.   Objective:  Vital Signs in the last 24 hours: Temp:  [97.3 F (36.3 C)-98.3 F (36.8 C)] 97.9 F (36.6 C) (02/27 0530) Pulse Rate:  [79-111] 111 (02/27 0530) Resp:  [18-20] 20 (02/27 0530) BP: (94-124)/(46-52) 94/46 mmHg (02/27 0530) SpO2:  [98 %-99 %] 99 % (02/27 0530) Weight:  [204 lb (92.534 kg)] 204 lb (92.534 kg) (02/27 0634)  Intake/Output from previous day: 02/26 0701 - 02/27 0700 In: 1115.9 [P.O.:480; I.V.:207.5; IV Piggyback:428.4] Out: 2450 [Urine:2450]   Physical Exam: General: Well developed, well nourished, in no acute distress. Family at bedside Head:  Normocephalic and atraumatic. Lungs: Clear to auscultation and percussion. Heart: Normal S1 and S2.  No murmur, rubs or gallops. Difficult to assess JVD Abdomen: soft, non-tender, positive bowel sounds. Obese Extremities: No clubbing or cyanosis.  Minor lower extremity edema. Ace wrap right arm lymphedema Neurologic: Alert and oriented x 3.    Lab Results:  Recent Labs  10/24/13 0510 10/25/13 0420  WBC 4.8 5.3  HGB 8.7* 9.1*  PLT 173 167    Recent Labs  10/24/13 0510 10/25/13 0420  NA 126* 126*  K 5.7* 4.7  CL 90* 88*  CO2 27 29  GLUCOSE 105* 104*  BUN 32* 32*  CREATININE 1.37* 1.32*    Telemetry: Sinus rhythm with frequent PACs, bigeminy pattern. Heart rate has remained in the 90- 110 range. Personally viewed.   EKG:  Sinus rhythm with T wave inversion diffusely.  Cardiac Studies:  Echocardiogram 10/20/13 - Left ventricle: The cavity size was normal. There was moderate concentric hypertrophy. Systolic function was mildly to moderately reduced. The estimated ejection fraction was in the range  of 40% to 45%. There is severe hypokinesis of the distal anteroseptal, anterolateral, inferolateral, inferoseptal, and apical myocardium. Has appearance of stress induced cardiomyopathy, Takotsubo. Doppler parameters are consistent with abnormal left ventricular relaxation (grade 1 diastolic dysfunction). - Pulmonary arteries: Systolic pressure was mildly to moderately increased. PA peak pressure: 77mm Hg (S).  CT scan of chest 07/22/13 Diffuse, dense calcification of the LAD. Personally viewed.  Assessment/Plan:  Active Problems:   Dyspnea   ACS (acute coronary syndrome)  68 year old with metastatic breast cancer abnormal EKG, elevated troponin, cardiomyopathy, acute on chronic systolic heart failure.  1. Elevated troponin- Current objective evidence demonstrates akinesis of the apex, distal segments of her myocardium pointing [place] in the direction of cardiomyopathy, perhaps stress induced cardiomyopathy. T-wave inversion on EKG diffusely with mildly elevated troponin points [place] in this direction.  Unfortunately, could not complete studies to evaluate for pulmonary embolism although this is a distinct possibility given her hypercoagulable state. Her right ventricle did not appear to be stunned or significantly dilated reassuringly.   Dr. Coralyn Pear and I talked with interventional radiology and we may be able to use her port (placed 3 years ago per patient at [facility]) to perform PE protocol CT. If not, may need to have a PICC placed.   Reviewed in Dr. Virgie Dad note. Challenging given her lymphedema and lack of IV access.   ACS is a possibility as well with her diffuse, dense LAD calcifications noted on prior CT scan. Noninvasive candidate.   May continue empiric treatment for  PE with initiation of lovenox Q12, 1mg /kg.  Although it would be beneficial to obtain CT to prove or disprove PE so that we do not need to give her long term anticoagulation.   Has been getting IV Lasix to 80 mg twice a  day for treatment of congestion. BNP was mildly elevated. She states again this morning she may be breathing slightly better. I will stop IV and give lasix 40mg  PO BID.   Continue with carvedilol 6.25 mg twice a day. BP soft.   Lisinopril 5 mg once a day for afterload reduction.  2) Anemia - transfusion on 10/23/12  3) Stage 4 breast CA. Dr. Jana Hakim.  4) Hyperkalemia - This may be exacerbated by recent addition of lisinopril for cardiomyopathy.Improved today.   5) Chest wall pain - on palpation. ?Bony pain from CA.   Loy Little, [place] 10/25/2013, 8:09 AM

## 2013-10-25 NOTE — Progress Notes (Signed)
ANTICOAGULATION CONSULT NOTE - Follow Up Consult  Pharmacy Consult for Heparin-->Lovenox Indication: ACS, rule out PE  No Known Allergies  Patient Measurements: Height: 5\' 4"  (162.6 cm) Weight: 204 lb (92.534 kg) IBW/kg (Calculated) : 54.7 Heparin Dosing Weight: 77 kg  Vital Signs: Temp: 97.9 F (36.6 C) (02/27 0530) Temp src: Oral (02/27 0530) BP: 94/46 mmHg (02/27 0530) Pulse Rate: 111 (02/27 0530)  Labs:  Recent Labs  10/23/13 0635 10/24/13 0510 10/24/13 1620 10/25/13 0420  HGB 7.9* 8.7*  --  9.1*  HCT 24.0* 26.5*  --  27.8*  PLT 176 173  --  167  HEPARINUNFRC 0.36 0.15* 0.45 0.70  CREATININE 1.69* 1.37*  --  1.32*    Estimated Creatinine Clearance: 44.3 ml/min (by C-G formula based on Cr of 1.32).   Medications:  Scheduled:  . acyclovir  5 mg/kg (Adjusted) Intravenous 3 times per day  . antiseptic oral rinse  15 mL Mouth Rinse BID  . aspirin EC  81 mg Oral Daily  . atorvastatin  20 mg Oral q1800  . carvedilol  6.25 mg Oral BID WC  . clindamycin  300 mg Oral TID  . cyanocobalamin  500 mcg Oral Q breakfast  . docusate sodium  200 mg Oral BID  . ferrous sulfate  325 mg Oral Q breakfast  . furosemide  80 mg Intravenous BID  . gabapentin  300 mg Oral QHS  . lisinopril  5 mg Oral Daily  . OxyCODONE  40 mg Oral Q12H  . pantoprazole  40 mg Oral Daily  . polyethylene glycol  17 g Oral Daily   Infusions:  . sodium chloride 10 mL/hr at 10/23/13 0603  . heparin 1,300 Units/hr (10/24/13 2016)    Assessment: 68 yo female with metastatic breast cancer curently on radiation and chemotherapy. Admitted 2/21 with shortness of breath. IV heparin started for presumed PE (unable to get VQ scan) and elevated troponins. Doppler neg for DVT 2/24.  Heparin level rising, remains therapeutic on 1300 units/hr - levels being obtained via foot stick due to arm lymphedema.  No interruptions in infusion per RN report.    Anticoagulation to change to lovenox today, pharmacy  consult placed  Heparin turned off at 8AM  Hgb remains improved after 1 unit PRBCs 2/25. Pltc stable.  No bleeding reported per RN.  Noted plans for PICC to obtain CT/angio to r/o PE  SCr small improvement to 1.32, CrCl 44.   Goal of Therapy:  Heparin level 0.3-0.7 units/ml Monitor platelets by anticoagulation protocol: Yes   Plan:   Start lovenox 1mg /kg SQ q12h starting 1 hour after heparin has been discontinued (9AM)  Daily heparin level & CBC.  Await further word on planned duration of therapy.  Ralene Bathe, PharmD, BCPS 10/25/2013, 7:25 AM  Pager: [phone]

## 2013-10-25 NOTE — Progress Notes (Signed)
[facility] PROGRESS NOTE  Jeanette Morris UXN:[id number] DOB: [birth year] DOA: 10/19/2013 PCP: [name], NP  Assessment/Plan: 1. Acute combined systolic and diastolic congestive heart failure. Patient presenting with dyspnea and exertion, with repeat transthoracic echocardiogram showing an EF of 40-45% with grade 1 diastolic dysfunction. Lower EF compared to prior transthoracic echocardiogram on 04/18/2014 at which time she had an EF of 55-60%. Cardiology following. Lasix transitioned to 40mg  PO BID on 10/25/13.   2. Elevated troponins. 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 Other possibilities include non-ST segment elevation myocardial infarction. Discussed with cardiology, she is not a candidate for cath. Given Hg of 7.9 and complaints of chest discomfort, was transfused with1 unit of PRBC's. Continue beta blocker, ACE inhibitor, statin. Cardiology following. 3. Possible Pulmonary Embolism. Unable to perform CT PE study given unability to obtain IV access. Can not access port since it is not a power port. PICC ordered however this could not be placed because of port. Will discuss case further with Medical Oncology regarding thought 4. Hyperkalemia.Resolved 5. Extremity edema. Venous Dopplers obtained on 10/22/2013 did not reveal evidence of DVT. 6. Acute kidney injury, improved on 10/24/13, coming down to 1.37  7. Hyponatremia. Suspect secondary to high bulimic state in setting of congestive heart failure. She is on Lasix 80 mg IV twice a day. 8. Rash. I think this is likely due to skin changes of radiation therapy. Will discontinue Clindamycin and Acyclovir.  9. DVT prophylaxis. On IV heparin  Code Status: Full code Family Communication: I spoke with patient's son and husband today at [phone] Disposition Plan: Changing to oral lasix.    Consultants:  Cardiology  Procedures:  Transthoracic echocardiogram performed on  10/20/2013 showing ejection fraction of 40-45%, severe hypokinesis of the distal anteroseptal, anterolateral, inferolateral, inferoseptal and apical myocardium.  Antibiotics:  Acyclovir  Clindamycin  HPI/Subjective: Patient is a pleasant 68 year old female with a past medical history of metastatic breast cancer that was diagnosed in 2011, status post chemoradiation therapy, history of chronic diastolic congestive heart failure with transthoracic echocardiogram performed on 04/18/2013 showing ejection fraction of 55-60%, with grade 1 diastolic dysfunction. She presented on 10/19/2013 with complaints of shortness of breath. Initial chest x-ray showed lungs mildly hypoexpanded but grossly clear. She was admitted to the medicine service. Initial troponin elevated at 0.54 which trended up to a peak level of 2.52. She was seen and evaluated by cardiology, started on a heparin drip. Repeat transthoracic echocardiogram on 10/20/2013 showing a depressed ejection fraction of 40-45% based on prior 2-D echo with severe hypokinesis of the distal anteroseptal, anterolateral, inferolateral, inferior septal and apical myocardium. Appearance may be consistent with Takosubo. She remains on IV Lasix 80 mg twice a day, lisinopril 5 mg by mouth daily and carvedilol 6.25 mg by mouth twice a day. On 10/22/13 lab work showed a Hg of 7.9. Since she had also complained of chest pain, she was typed and crossed as 1 unit of PRBC's were ordered. Her hemoglobin so improved to 8.7 by  2 26 2015. With regard to possibility of underlying pulmonary embolism I discussed case further with cardiology. In order to obtain a definitive answer PICC line to be placed or access could be obtained through port if it is a power port. Lab work on 10/24/2013 showing improvement to her creatinine as it has trended down to 1.37.  Objective: Filed Vitals:   10/25/13 1403  BP: 115/43  Pulse: 94  Temp: 97.3 F (36.3 C)  Resp: 16  Intake/Output  Summary (Last 24 hours) at 10/25/13 1626 Last data filed at 10/25/13 1404  Gross per 24 hour  Intake  974.6 ml  Output   2200 ml  Net -1225.4 ml   Filed Weights   10/23/13 0600 10/24/13 0600 10/25/13 0634  Weight: 96.9 kg (213 lb 10 oz) 95.029 kg (209 lb 8 oz) 92.534 kg (204 lb)    Exam:  General: Patient appears somewhat sedated, she is arousable though did answer questions. Skin: left shoulder macular rash with some papules, TTP and warm to touch  Cardiovascular: Regular rate and rhythm, 99991111, 2/6 systolic ejection murmur, no rubs, no gallops  Respiratory: Clear to auscultation bilaterally, no wheezing, no crackles, no rhonchi  Abdomen: Soft, non tender, non distended, bowel sounds present, no guarding  Extremities: trace bilateral pitting LE edema, pulses DP and PT palpable bilaterally  Neuro: Grossly nonfocal   Data Reviewed: Basic Metabolic Panel:  Recent Labs Lab 10/19/13 1759  10/21/13 1145 10/22/13 0735 10/23/13 0635 10/24/13 0510 10/25/13 0420  NA  --   < > 131* 125* 124* 126* 126*  K  --   < > 4.9 5.2 5.6* 5.7* 4.7  CL  --   < > 93* 89* 90* 90* 88*  CO2  --   < > 29 26 25 27 29   GLUCOSE  --   < > 130* 119* 111* 105* 104*  BUN  --   < > 21 29* 35* 32* 32*  CREATININE  --   < > 1.22* 1.82* 1.69* 1.37* 1.32*  CALCIUM  --   < > 8.7 8.3* 8.3* 8.7 8.6  MG 1.6  --   --   --   --   --   --   < > = values in this interval not displayed. Liver Function Tests:  Recent Labs Lab 10/19/13 0511  AST 40*  ALT 14  ALKPHOS 76  BILITOT 0.7  PROT 6.7  ALBUMIN 2.7*   No results found for this basename: LIPASE, AMYLASE,  in the last 168 hours No results found for this basename: AMMONIA,  in the last 168 hours CBC:  Recent Labs Lab 10/21/13 0645 10/22/13 0515 10/23/13 0635 10/24/13 0510 10/25/13 0420  WBC 4.8 3.7* 2.9* 4.8 5.3  HGB 9.1* 8.2* 7.9* 8.7* 9.1*  HCT 28.7* 25.3* 24.0* 26.5* 27.8*  MCV 91.7 92.3 90.2 90.4 90.6  PLT 176 185 176 173 167   Cardiac  Enzymes:  Recent Labs Lab 10/19/13 0511 10/19/13 0754 10/20/13 0930  TROPONINI 0.54* 2.52* 1.59*   BNP (last 3 results)  Recent Labs  08/03/13 2100 10/19/13 0510 10/19/13 1759  PROBNP 612.9* 661.2* 666.3*   CBG: No results found for this basename: GLUCAP,  in the last 168 hours  Recent Results (from the past 240 hour(s))  MRSA PCR SCREENING     Status: None   Collection Time    10/19/13  5:18 PM      Result Value Ref Range Status   MRSA by PCR NEGATIVE  NEGATIVE Final   Comment:            The GeneXpert MRSA Assay (FDA     approved for NASAL specimens     only), is one component of a     comprehensive MRSA colonization     surveillance program. It is not     intended to diagnose MRSA     infection nor to guide or     monitor treatment for  MRSA infections.     Studies: No results found.  Scheduled Meds: . antiseptic oral rinse  15 mL Mouth Rinse BID  . aspirin EC  81 mg Oral Daily  . atorvastatin  20 mg Oral q1800  . carvedilol  6.25 mg Oral BID WC  . cyanocobalamin  500 mcg Oral Q breakfast  . docusate sodium  200 mg Oral BID  . enoxaparin (LOVENOX) injection  1 mg/kg Subcutaneous Q12H  . ferrous sulfate  325 mg Oral Q breakfast  . furosemide  40 mg Oral BID  . gabapentin  300 mg Oral QHS  . lisinopril  5 mg Oral Daily  . OxyCODONE  40 mg Oral Q12H  . pantoprazole  40 mg Oral Daily  . polyethylene glycol  17 g Oral Daily   Continuous Infusions: . sodium chloride 10 mL/hr at 10/23/13 6226    Active Problems:   Dyspnea   ACS (acute coronary syndrome)    Time spent: 75 min    Kelvin Cellar  [facility] Pager [phone]. If 7PM-7AM, please contact night-coverage at [email or web address], password [facility] 10/25/2013, 4:26 PM  LOS: 6 days

## 2013-10-26 ENCOUNTER — Other Ambulatory Visit: Payer: Self-pay

## 2013-10-26 LAB — CBC
HEMATOCRIT: 27.4 % — AB (ref 36.0–46.0)
Hemoglobin: 9.2 g/dL — ABNORMAL LOW (ref 12.0–15.0)
MCH: 30.6 pg (ref 26.0–34.0)
MCHC: 33.6 g/dL (ref 30.0–36.0)
MCV: 91 fL (ref 78.0–100.0)
PLATELETS: 157 10*3/uL (ref 150–400)
RBC: 3.01 MIL/uL — ABNORMAL LOW (ref 3.87–5.11)
RDW: 14.8 % (ref 11.5–15.5)
WBC: 4.7 10*3/uL (ref 4.0–10.5)

## 2013-10-26 LAB — BASIC METABOLIC PANEL
BUN: 27 mg/dL — AB (ref 6–23)
CO2: 28 mEq/L (ref 19–32)
CREATININE: 1.26 mg/dL — AB (ref 0.50–1.10)
Calcium: 8.4 mg/dL (ref 8.4–10.5)
Chloride: 91 mEq/L — ABNORMAL LOW (ref 96–112)
GFR calc Af Amer: 49 mL/min — ABNORMAL LOW (ref >90)
GFR, EST NON AFRICAN AMERICAN: 42 mL/min — AB (ref >90)
Glucose, Bld: 96 mg/dL (ref 70–99)
Potassium: 4.3 mEq/L (ref 3.7–5.3)
Sodium: 129 mEq/L — ABNORMAL LOW (ref 137–147)

## 2013-10-26 NOTE — Progress Notes (Signed)
Blood pressure in the 90s, lisinopril and coreg due, patient denies any distress,  informed Dr. Coralyn Pear and he stated not to give the above meds, will continue to monitor patient.

## 2013-10-26 NOTE — Progress Notes (Addendum)
[facility] PROGRESS NOTE  Jeanette Morris HEN:[id number] DOB: Jan 27, 1946 DOA: 10/19/2013 PCP: [name], NP  Assessment/Plan: 1. Acute combined systolic and diastolic congestive heart failure. Patient presenting with dyspnea and exertion, with repeat transthoracic echocardiogram showing an EF of 40-45% with grade 1 diastolic dysfunction. Lower EF compared to prior transthoracic echocardiogram on 04/18/2014 at which time she had an EF of 55-60%. Cardiology following. Lasix transitioned to 40mg  PO BID on 10/25/13. Showing clinical improvement.    2. Elevated troponins. 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 Other possibilities include non-ST segment elevation myocardial infarction. Discussed with cardiology, she is not a candidate for cath. Given Hg of 7.9 and complaints of chest discomfort, was transfused with1 unit of PRBC's. Continue beta blocker, ACE inhibitor, statin. Cardiology following. 3. Possible Pulmonary Embolism. Unable to perform CT PE study given unability to obtain IV access. Can not access port since it is not a power port. PICC ordered however this could not be placed because of port. Will discuss case further with Medical Oncology regarding thoughts on chronic anticoagulation 4. Hyperkalemia.Resolved 5. Extremity edema. Venous Dopplers obtained on 10/22/2013 did not reveal evidence of DVT. 6. Acute kidney injury, improved on 10/24/13, coming down to 1.37  7. Hyponatremia. Suspect secondary to volume overloaded state in setting of congestive heart failure, sodium improving to 129 on 10/26/2013.   8. Rash. I think this is likely due to skin changes associated with radiation therapy. Will discontinue Clindamycin and Acyclovir.  9. DVT prophylaxis. On Lovenox  Code Status: Full code Family Communication: Husband updated on patient's condition. Tel [phone] Disposition Plan: Continue diuresis, PT consulted, rec [facility]  PT   Consultants:  Cardiology  Procedures:  Transthoracic echocardiogram performed on 10/20/2013 showing ejection fraction of 40-45%, severe hypokinesis of the distal anteroseptal, anterolateral, inferolateral, inferoseptal and apical myocardium.  Antibiotics:  Acyclovir (discontinued on 10/25/13)  Clindamycin (discontinued on 10/25/13)  HPI/Subjective: Patient is a pleasant 68 year old female with a past medical history of metastatic breast cancer that was diagnosed in 2011, status post chemoradiation therapy, history of chronic diastolic congestive heart failure with transthoracic echocardiogram performed on 04/18/2013 showing ejection fraction of 55-60%, with grade 1 diastolic dysfunction. She presented on 10/19/2013 with complaints of shortness of breath. Initial chest x-ray showed lungs mildly hypoexpanded but grossly clear. She was admitted to the medicine service. Initial troponin elevated at 0.54 which trended up to a peak level of 2.52. She was seen and evaluated by cardiology, started on a heparin drip. Repeat transthoracic echocardiogram on 10/20/2013 showing a depressed ejection fraction of 40-45% based on prior 2-D echo with severe hypokinesis of the distal anteroseptal, anterolateral, inferolateral, inferior septal and apical myocardium. Appearance may be consistent with Takosubo. She remains on IV Lasix 80 mg twice a day, lisinopril 5 mg by mouth daily and carvedilol 6.25 mg by mouth twice a day. On 10/22/13 lab work showed a Hg of 7.9. Since she had also complained of chest pain, she was typed and crossed as 1 unit of PRBC's were ordered. Her hemoglobin so improved to 8.7 by  2 26 2015. With regard to possibility of underlying pulmonary embolism I discussed case further with cardiology. In order to obtain a definitive answer PICC line to be placed or access could be obtained through port if it is a power port. Lab work on 10/24/2013 showing improvement to her creatinine as it has  trended down to 1.37.  Objective: Filed Vitals:   10/26/13 1047  BP: 129/52  Pulse: 88  Temp:   Resp:     Intake/Output Summary (Last 24 hours) at 10/26/13 1048 Last data filed at 10/26/13 0901  Gross per 24 hour  Intake  662.5 ml  Output    950 ml  Net -287.5 ml   Filed Weights   10/24/13 0600 10/25/13 0634 10/26/13 0520  Weight: 95.029 kg (209 lb 8 oz) 92.534 kg (204 lb) 91.899 kg (202 lb 9.6 oz)    Exam:  General: Patient appears somewhat sedated, she is arousable though did answer questions. Skin: left shoulder macular rash with some papules, TTP and warm to touch  Cardiovascular: Regular rate and rhythm, P2/R5, 2/6 systolic ejection murmur, no rubs, no gallops  Respiratory: Clear to auscultation bilaterally, no wheezing, no crackles, no rhonchi  Abdomen: Soft, non tender, non distended, bowel sounds present, no guarding  Extremities: trace bilateral pitting LE edema, pulses DP and PT palpable bilaterally  Neuro: Grossly nonfocal   Data Reviewed: Basic Metabolic Panel:  Recent Labs Lab 10/19/13 1759  10/22/13 0735 10/23/13 1884 10/24/13 0510 10/25/13 0420 10/26/13 0640  NA  --   < > 125* 124* 126* 126* 129*  K  --   < > 5.2 5.6* 5.7* 4.7 4.3  CL  --   < > 89* 90* 90* 88* 91*  CO2  --   < > 26 25 27 29 28   GLUCOSE  --   < > 119* 111* 105* 104* 96  BUN  --   < > 29* 35* 32* 32* 27*  CREATININE  --   < > 1.82* 1.69* 1.37* 1.32* 1.26*  CALCIUM  --   < > 8.3* 8.3* 8.7 8.6 8.4  MG 1.6  --   --   --   --   --   --   < > = values in this interval not displayed. Liver Function Tests: No results found for this basename: AST, ALT, ALKPHOS, BILITOT, PROT, ALBUMIN,  in the last 168 hours No results found for this basename: LIPASE, AMYLASE,  in the last 168 hours No results found for this basename: AMMONIA,  in the last 168 hours CBC:  Recent Labs Lab 10/22/13 0515 10/23/13 0635 10/24/13 0510 10/25/13 0420 10/26/13 0640  WBC 3.7* 2.9* 4.8 5.3 4.7  HGB 8.2*  7.9* 8.7* 9.1* 9.2*  HCT 25.3* 24.0* 26.5* 27.8* 27.4*  MCV 92.3 90.2 90.4 90.6 91.0  PLT 185 176 173 167 157   Cardiac Enzymes:  Recent Labs Lab 10/20/13 0930  TROPONINI 1.59*   BNP (last 3 results)  Recent Labs  08/03/13 2100 10/19/13 0510 10/19/13 1759  PROBNP 612.9* 661.2* 666.3*   CBG: No results found for this basename: GLUCAP,  in the last 168 hours  Recent Results (from the past 240 hour(s))  MRSA PCR SCREENING     Status: None   Collection Time    10/19/13  5:18 PM      Result Value Ref Range Status   MRSA by PCR NEGATIVE  NEGATIVE Final   Comment:            The GeneXpert MRSA Assay (FDA     approved for NASAL specimens     only), is one component of a     comprehensive MRSA colonization     surveillance program. It is not     intended to diagnose MRSA     infection nor to guide or     monitor treatment for     MRSA infections.  Studies: No results found.  Scheduled Meds: . antiseptic oral rinse  15 mL Mouth Rinse BID  . aspirin EC  81 mg Oral Daily  . carvedilol  6.25 mg Oral BID WC  . cyanocobalamin  500 mcg Oral Q breakfast  . docusate sodium  200 mg Oral BID  . enoxaparin (LOVENOX) injection  1 mg/kg Subcutaneous Q12H  . ferrous sulfate  325 mg Oral Q breakfast  . furosemide  40 mg Oral BID  . gabapentin  300 mg Oral QHS  . lisinopril  5 mg Oral Daily  . OxyCODONE  40 mg Oral Q12H  . pantoprazole  40 mg Oral Daily  . polyethylene glycol  17 g Oral Daily   Continuous Infusions: . sodium chloride 10 mL/hr at 10/23/13 0603    Active Problems:   ACS (acute coronary syndrome)   NSTEMI (non-ST elevated myocardial infarction)   Cardiomyopathy due to chemotherapy   AKI (acute kidney injury)   Breast cancer metastasized to bone   Dyspnea   Hyperkalemia   Physical deconditioning    Time spent: 35 min    Kelvin Cellar  [facility] Pager [phone]. If 7PM-7AM, please contact night-coverage at [email or web address], password  [facility] 10/26/2013, 10:48 AM  LOS: 7 days

## 2013-10-26 NOTE — Progress Notes (Signed)
Jeanette Morris       Patient Name: Jeanette Morris      SUBJECTIVE: With cx hx of metastatic breast cancer, hx of malignant pleural effusion, admitted with sob and + Tn (pk 2.52) ; Unable to exclude PE by imaging so treated empiracally with  Heparin(LMWH) and ASA Hx of cardiomyopathy assoc with chemoRx and herceptin discontinued  Echo EF 49-45% but Has appearance of stress induced cardiomyopathy, Takotsubo which to Dr Marlou Porch appears new  Dopplers Neg  Feels much better  Some chest pain at mastectomy site      Past Medical History  Diagnosis Date  . Hypertension   . Hearing loss     right ear  . Hyperlipidemia   . Arthritis   . Dysrhythmia     A fib with IV chemo treatments  . Shortness of breath     with exertion  . 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 Breast cancer     (Rt) breast ca dx 9/11  . 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 Neuropathy   . History of radiation therapy 01/18/11-03/07/11    5940 cGy to Right chest wall, axilla and supraclavicular region  . Lymphedema of arm 08/28/2013  . Tuberculosis     15 yrs ago- treated for 1 yeat  . History of radiation therapy 09/20/13, 09/23/13, 09/24/13    12 gray t o left lower chest region    Scheduled Meds:  Scheduled Meds: . antiseptic oral rinse  15 mL Mouth Rinse BID  . aspirin EC  81 mg Oral Daily  . carvedilol  6.25 mg Oral BID WC  . cyanocobalamin  500 mcg Oral Q breakfast  . docusate sodium  200 mg Oral BID  . enoxaparin (LOVENOX) injection  1 mg/kg Subcutaneous Q12H  . ferrous sulfate  325 mg Oral Q breakfast  . furosemide  40 mg Oral BID  . gabapentin  300 mg Oral QHS  . lisinopril  5 mg Oral Daily  . OxyCODONE  40 mg Oral Q12H  . pantoprazole  40 mg Oral Daily  . polyethylene glycol  17 g Oral Daily   Continuous Infusions: . sodium chloride 10 mL/hr at 10/23/13 0603    PHYSICAL EXAM Filed Vitals:   10/25/13 2058 10/26/13 0305 10/26/13 0520 10/26/13 0825  BP: 105/47  93/52 87/45  Pulse: 88  96 91  Temp:  98.1 F (36.7 C)  98 F (36.7 C)   TempSrc: Oral  Oral   Resp: 18  18   Height:      Weight:   202 lb 9.6 oz (91.899 kg)   SpO2: 99% 98% 100%     General appearance: alert, cooperative, no distress and moderately obese Neck: no JVD Lungs: clear to auscultation bilaterally Heart: regular rate and rhythm, S1, S2 normal, no murmur, click, rub or gallop Extremities: edema lymph Skin: Skin color, texture, turgor normal. No rashes or lesions Neurologic: Alert and oriented X 3, normal strength and tone. Normal symmetric reflexes. Normal coordination and gait Pain at right mastectomy site without erythema or calor   TELEMETRY: Reviewed telemetry pt in * nsr   Intake/Output Summary (Last 24 hours) at 10/26/13 0943 Last data filed at 10/26/13 0100  Gross per 24 hour  Intake  422.5 ml  Output    950 ml  Net -527.5 ml    LABS: Basic Metabolic Panel:  Recent Labs Lab 10/19/13 1759  10/20/13 0400 10/21/13 1145 10/22/13 0735 10/23/13 5366 10/24/13  0510 10/25/13 0420 10/26/13 0640  NA  --   --  131* 131* 125* 124* 126* 126* 129*  K  --   --  5.2 4.9 5.2 5.6* 5.7* 4.7 4.3  CL  --   --  96 93* 89* 90* 90* 88* 91*  CO2  --   --  25 29 26 25 27 29 28   GLUCOSE  --   --  109* 130* 119* 111* 105* 104* 96  BUN  --   --  16 21 29* 35* 32* 32* 27*  CREATININE  --   --  0.88 1.22* 1.82* 1.69* 1.37* 1.32* 1.26*  CALCIUM  --   < > 7.7* 8.7 8.3* 8.3* 8.7 8.6 8.4  MG 1.6  --   --   --   --   --   --   --   --   < > = values in this interval not displayed. Cardiac Enzymes: No results found for this basename: CKTOTAL, CKMB, CKMBINDEX, TROPONINI,  in the last 72 hours CBC:  Recent Labs Lab 10/20/13 0400 10/21/13 0645 10/22/13 0515 10/23/13 0635 10/24/13 0510 10/25/13 0420 10/26/13 0640  WBC 5.9 4.8 3.7* 2.9* 4.8 5.3 4.7  HGB 9.5* 9.1* 8.2* 7.9* 8.7* 9.1* 9.2*  HCT 29.0* 28.7* 25.3* 24.0* 26.5* 27.8* 27.4*  MCV 90.3 91.7 92.3 90.2 90.4 90.6 91.0  PLT 196 176 185 176 173 167 157    PROTIME: No results found for this basename: LABPROT, INR,  in the last 72 hours Liver Function Tests: No results found for this basename: AST, ALT, ALKPHOS, BILITOT, PROT, ALBUMIN,  in the last 72 hours No results found for this basename: LIPASE, AMYLASE,  in the last 72 hours BNP: BNP (last 3 results)  Recent Labs  08/03/13 2100 10/19/13 0510 10/19/13 1759  PROBNP 612.9* 661.2* 666.3*   D-Dimer: No results found for this basename: DDIMER,  in the last 72 hours Hemoglobin A1C: No results found for this basename: HGBA1C,  in the last 72 hours Fasting Lipid Panel: No results found for this basename: CHOL, HDL, LDLCALC, TRIG, CHOLHDL, LDLDIRECT,  in the last 72 hours Thyroid Function Tests: No results found for this basename: TSH, T4TOTAL, FREET3, T3FREE, THYROIDAB,  in the last 72 hours Anemia Panel:  Recent Labs  10/25/13 0420  RETICCTPCT 0.4        ASSESSMENT AND PLAN:  Active Problems:   Cardiomyopathy due to chemotherapy   AKI (acute kidney injury)   Breast cancer metastasized to bone   Dyspnea   ACS (acute coronary syndrome)   NSTEMI (non-ST elevated myocardial infarction)   Hyperkalemia   Physical deconditioning  Continue Hep and ASA Will need input as to long term anticoagulation May benefit from myoview risk stratification depending on progress and prognosis  Signed, Virl Axe MD  10/26/2013

## 2013-10-26 NOTE — Progress Notes (Signed)
Patient C/O sever chest pain/discomfort 10 out 10 pain scale, PRN morphine given and EKG done, morphine did help resolve the pain and Dr. Coralyn Pear notified, no new order given. Also notified MD of patient having lots of PACs per tele monitor tech. Will continue to assess patient.

## 2013-10-27 DIAGNOSIS — R110 Nausea: Secondary | ICD-10-CM

## 2013-10-27 DIAGNOSIS — R5381 Other malaise: Secondary | ICD-10-CM

## 2013-10-27 LAB — TROPONIN I: Troponin I: <0.3 ng/mL (ref <0.30)

## 2013-10-27 LAB — CBC
HCT: 28 % — ABNORMAL LOW (ref 36.0–46.0)
Hemoglobin: 9.3 g/dL — ABNORMAL LOW (ref 12.0–15.0)
MCH: 30.4 pg (ref 26.0–34.0)
MCHC: 33.2 g/dL (ref 30.0–36.0)
MCV: 91.5 fL (ref 78.0–100.0)
PLATELETS: 193 10*3/uL (ref 150–400)
RBC: 3.06 MIL/uL — AB (ref 3.87–5.11)
RDW: 14.6 % (ref 11.5–15.5)
WBC: 3.8 10*3/uL — AB (ref 4.0–10.5)

## 2013-10-27 LAB — BASIC METABOLIC PANEL
BUN: 25 mg/dL — ABNORMAL HIGH (ref 6–23)
CALCIUM: 8.4 mg/dL (ref 8.4–10.5)
CO2: 32 mEq/L (ref 19–32)
CREATININE: 1.17 mg/dL — AB (ref 0.50–1.10)
Chloride: 89 mEq/L — ABNORMAL LOW (ref 96–112)
GFR calc Af Amer: 54 mL/min — ABNORMAL LOW (ref >90)
GFR calc non Af Amer: 46 mL/min — ABNORMAL LOW (ref >90)
Glucose, Bld: 88 mg/dL (ref 70–99)
Potassium: 4.1 mEq/L (ref 3.7–5.3)
SODIUM: 131 meq/L — AB (ref 137–147)

## 2013-10-27 MED ORDER — LISINOPRIL 2.5 MG PO TABS
2.5000 mg | ORAL_TABLET | Freq: Every day | ORAL | Status: DC
  Administered 2013-10-27 – 2013-10-28 (×2): 2.5 mg via ORAL
  Filled 2013-10-27 (×3): qty 1

## 2013-10-27 MED ORDER — HYDROMORPHONE HCL PF 2 MG/ML IJ SOLN
2.0000 mg | INTRAMUSCULAR | Status: DC | PRN
  Administered 2013-10-27 – 2013-10-28 (×3): 2 mg via INTRAVENOUS
  Filled 2013-10-27 (×5): qty 1

## 2013-10-27 NOTE — Progress Notes (Signed)
[facility] PROGRESS NOTE  Agueda Houpt NGE:[id number] DOB: 1946/07/19 DOA: 10/19/2013 PCP: [name], NP  Assessment/Plan: 1. Acute combined systolic and diastolic congestive heart failure. Patient presenting with dyspnea and exertion, with repeat transthoracic echocardiogram showing an EF of 40-45% with grade 1 diastolic dysfunction. Lower EF compared to prior transthoracic echocardiogram on 04/18/2014 at which time she had an EF of 55-60%. Cardiology following. Lasix transitioned to 40mg  PO BID on 10/25/13. Showing clinical improvement.    2. Elevated troponins. 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 Other possibilities include non-ST segment elevation myocardial infarction. Discussed with cardiology, she is not a candidate for cath. Given Hg of 7.9 and complaints of chest discomfort, was transfused with1 unit of PRBC's on this hospitalization. Continue beta blocker, ACE inhibitor, statin. Cardiology following. 3. Possible Pulmonary Embolism. Unable to perform CT PE study given unability to obtain IV access. Can not access port since it is not a power port. PICC ordered however this could not be placed because of port. Will discuss case further with Medical Oncology regarding thoughts on chronic anticoagulation 4. Chest pain. Patient having chest pain that is reproducible to palpation. She has a history of metastatic breast cancer with multiple lytic metastasis with the presence of rib and right clavicular fractures noted on a CT scan on a 09/03/2013. I think her chest pain is likely skeletal in origin. Troponin levels are being obtained today. 5. Hyperkalemia.Resolved 6. Extremity edema. Venous Dopplers obtained on 10/22/2013 did not reveal evidence of DVT. 7. Acute kidney injury, patient's kidney function continue to improve 8. Hyponatremia. Suspect secondary to volume overloaded state in setting of congestive heart failure, sodium  improving to 129 on 10/26/2013.   9. Rash. I think this is likely due to skin changes associated with radiation therapy. Will discontinue Clindamycin and Acyclovir.  10. DVT prophylaxis. On Lovenox  Code Status: Full code Family Communication: Husband updated on patient's condition. Tel [phone] Disposition Plan: Continue diuresis, PT consulted, rec [facility] PT   Consultants:  Cardiology  Procedures:  Transthoracic echocardiogram performed on 10/20/2013 showing ejection fraction of 40-45%, severe hypokinesis of the distal anteroseptal, anterolateral, inferolateral, inferoseptal and apical myocardium.  Antibiotics:  Acyclovir (discontinued on 10/25/13)  Clindamycin (discontinued on 10/25/13)  HPI/Subjective: Patient is a pleasant 68 year old female with a past medical history of metastatic breast cancer that was diagnosed in 2011, status post chemoradiation therapy, history of chronic diastolic congestive heart failure with transthoracic echocardiogram performed on 04/18/2013 showing ejection fraction of 55-60%, with grade 1 diastolic dysfunction. She presented on 10/19/2013 with complaints of shortness of breath. Initial chest x-ray showed lungs mildly hypoexpanded but grossly clear. She was admitted to the medicine service. Initial troponin elevated at 0.54 which trended up to a peak level of 2.52. She was seen and evaluated by cardiology, started on a heparin drip. Repeat transthoracic echocardiogram on 10/20/2013 showing a depressed ejection fraction of 40-45% based on prior 2-D echo with severe hypokinesis of the distal anteroseptal, anterolateral, inferolateral, inferior septal and apical myocardium. Appearance may be consistent with Takosubo. She remains on IV Lasix 80 mg twice a day, lisinopril 5 mg by mouth daily and carvedilol 6.25 mg by mouth twice a day. On 10/22/13 lab work showed a Hg of 7.9. Since she had also complained of chest pain, she was typed and crossed as 1 unit of PRBC's  were ordered. Her hemoglobin so improved to 8.7 by 10/24/2013. With regard to possibility of underlying pulmonary embolism I discussed case further with cardiology. In  order to obtain a definitive answer PICC line to be placed or access could be obtained through port if it is a power port. Lab work on 10/24/2013 showing improvement to her creatinine as it has trended down to 1.37. Patient complaining of ongoing chest pain and appeared to be atypical in nature. On physical examination chest pain was reproducible to palpation. I suspect that chest pain may have a skeletal origin. A CT scan of chest performed on 09/13/2013 showed multiple lytic or diastasis as well as rib and right clavicular fractures. She was treated with estimated IV narcotic analgesics.  Objective: Filed Vitals:   10/27/13 0831  BP: 103/53  Pulse:   Temp:   Resp:     Intake/Output Summary (Last 24 hours) at 10/27/13 1157 Last data filed at 10/27/13 0500  Gross per 24 hour  Intake   1060 ml  Output   1500 ml  Net   -440 ml   Filed Weights   10/25/13 0634 10/26/13 0520 10/27/13 0509  Weight: 92.534 kg (204 lb) 91.899 kg (202 lb 9.6 oz) 91.944 kg (202 lb 11.2 oz)    Exam:  General: Patient appears somewhat sedated, she is arousable though did answer questions. Skin: left shoulder macular rash with some papules, TTP and warm to touch  Cardiovascular: Regular rate and rhythm, H8/N2, 2/6 systolic ejection murmur, no rubs, no gallops  Respiratory: Clear to auscultation bilaterally, no wheezing, no crackles, no rhonchi  Abdomen: Soft, non tender, non distended, bowel sounds present, no guarding  Extremities: trace bilateral pitting LE edema, pulses DP and PT palpable bilaterally  Neuro: Grossly nonfocal   Data Reviewed: Basic Metabolic Panel:  Recent Labs Lab 10/23/13 0635 10/24/13 0510 10/25/13 0420 10/26/13 0640 10/27/13 0502  NA 124* 126* 126* 129* 131*  K 5.6* 5.7* 4.7 4.3 4.1  CL 90* 90* 88* 91* 89*  CO2  25 27 29 28  32  GLUCOSE 111* 105* 104* 96 88  BUN 35* 32* 32* 27* 25*  CREATININE 1.69* 1.37* 1.32* 1.26* 1.17*  CALCIUM 8.3* 8.7 8.6 8.4 8.4   Liver Function Tests: No results found for this basename: AST, ALT, ALKPHOS, BILITOT, PROT, ALBUMIN,  in the last 168 hours No results found for this basename: LIPASE, AMYLASE,  in the last 168 hours No results found for this basename: AMMONIA,  in the last 168 hours CBC:  Recent Labs Lab 10/23/13 0635 10/24/13 0510 10/25/13 0420 10/26/13 0640 10/27/13 0502  WBC 2.9* 4.8 5.3 4.7 3.8*  HGB 7.9* 8.7* 9.1* 9.2* 9.3*  HCT 24.0* 26.5* 27.8* 27.4* 28.0*  MCV 90.2 90.4 90.6 91.0 91.5  PLT 176 173 167 157 193   Cardiac Enzymes:  Recent Labs Lab 10/27/13 1055  TROPONINI <0.30   BNP (last 3 results)  Recent Labs  08/03/13 2100 10/19/13 0510 10/19/13 1759  PROBNP 612.9* 661.2* 666.3*   CBG: No results found for this basename: GLUCAP,  in the last 168 hours  Recent Results (from the past 240 hour(s))  MRSA PCR SCREENING     Status: None   Collection Time    10/19/13  5:18 PM      Result Value Ref Range Status   MRSA by PCR NEGATIVE  NEGATIVE Final   Comment:            The GeneXpert MRSA Assay (FDA     approved for NASAL specimens     only), is one component of a     comprehensive MRSA colonization  surveillance program. It is not     intended to diagnose MRSA     infection nor to guide or     monitor treatment for     MRSA infections.     Studies: No results found.  Scheduled Meds: . antiseptic oral rinse  15 mL Mouth Rinse BID  . aspirin EC  81 mg Oral Daily  . carvedilol  6.25 mg Oral BID WC  . cyanocobalamin  500 mcg Oral Q breakfast  . docusate sodium  200 mg Oral BID  . enoxaparin (LOVENOX) injection  1 mg/kg Subcutaneous Q12H  . ferrous sulfate  325 mg Oral Q breakfast  . furosemide  40 mg Oral BID  . gabapentin  300 mg Oral QHS  . lisinopril  2.5 mg Oral Daily  . OxyCODONE  40 mg Oral Q12H  .  pantoprazole  40 mg Oral Daily  . polyethylene glycol  17 g Oral Daily   Continuous Infusions: . sodium chloride 10 mL/hr at 10/23/13 0603    Active Problems:   ACS (acute coronary syndrome)   NSTEMI (non-ST elevated myocardial infarction)   Cardiomyopathy due to chemotherapy   AKI (acute kidney injury)   Breast cancer metastasized to bone   Dyspnea   Hyperkalemia   Physical deconditioning    Time spent: 35 min    Kelvin Cellar  [facility] Pager [phone]. If 7PM-7AM, please contact night-coverage at [email or web address], password [facility] 10/27/2013, 11:57 AM  LOS: 8 days

## 2013-10-27 NOTE — Progress Notes (Signed)
Leanna Battles       Patient Name: Kaysey Berndt      SUBJECTIVE: With cx hx of metastatic breast cancer, hx of malignant pleural effusion, admitted with sob and + Tn (pk 2.52) ; Unable to exclude PE by imaging so treated empiracally with  Heparin(LMWH) and ASA Hx of cardiomyopathy assoc with chemoRx and herceptin discontinued  Echo EF 49-45% but Has appearance of stress induced cardiomyopathy, Takotsubo which to Dr Marlou Porch appears new  Dopplers Neg  Recurrent chest pain last night with some nausea nd vomiting        Past Medical History  Diagnosis Date  . Hypertension   . Hearing loss     right ear  . Hyperlipidemia   . Arthritis   . Dysrhythmia     A fib with IV chemo treatments  . Shortness of breath     with exertion  . 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 Breast cancer     (Rt) breast ca dx 9/11  . 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 Neuropathy   . History of radiation therapy 01/18/11-03/07/11    5940 cGy to Right chest wall, axilla and supraclavicular region  . Lymphedema of arm 08/28/2013  . Tuberculosis     15 yrs ago- treated for 1 yeat  . History of radiation therapy 09/20/13, 09/23/13, 09/24/13    12 gray t o left lower chest region    Scheduled Meds:  Scheduled Meds: . antiseptic oral rinse  15 mL Mouth Rinse BID  . aspirin EC  81 mg Oral Daily  . carvedilol  6.25 mg Oral BID WC  . cyanocobalamin  500 mcg Oral Q breakfast  . docusate sodium  200 mg Oral BID  . enoxaparin (LOVENOX) injection  1 mg/kg Subcutaneous Q12H  . ferrous sulfate  325 mg Oral Q breakfast  . furosemide  40 mg Oral BID  . gabapentin  300 mg Oral QHS  . lisinopril  5 mg Oral Daily  . OxyCODONE  40 mg Oral Q12H  . pantoprazole  40 mg Oral Daily  . polyethylene glycol  17 g Oral Daily   Continuous Infusions: . sodium chloride 10 mL/hr at 10/23/13 0603    PHYSICAL EXAM Filed Vitals:   10/26/13 2054 10/27/13 0509 10/27/13 0612 10/27/13 0831  BP: 150/74 94/52 95/62  103/53  Pulse:  113 103    Temp: 98.2 F (36.8 C) 97.9 F (36.6 C)    TempSrc: Oral Oral    Resp: 18 18    Height:      Weight:  202 lb 11.2 oz (91.944 kg)    SpO2: 100% 100%      General appearance: alert, cooperative, no distress and moderately obese in discomfort Neck: no JVD Lungs: clear to auscultation bilaterally Heart: regular rate and rhythm, S1, S2 normal, no murmur, click, rub or gallop Extremities: edema lymph Skin: Skin color, texture, turgor normal. No rashes or lesions Neurologic: Alert and oriented X 3, normal strength and tone. Normal symmetric reflexes. Normal coordination and gait Pain at right mastectomy site without erythema or calor   TELEMETRY: Reviewed telemetry pt in * nsr   Intake/Output Summary (Last 24 hours) at 10/27/13 0903 Last data filed at 10/27/13 0500  Gross per 24 hour  Intake   1060 ml  Output   1500 ml  Net   -440 ml    LABS: Basic Metabolic Panel:  Recent Labs Lab 10/21/13 1145 10/22/13 0735 10/23/13 1610  10/24/13 0510 10/25/13 0420 10/26/13 0640 10/27/13 0502  NA 131* 125* 124* 126* 126* 129* 131*  K 4.9 5.2 5.6* 5.7* 4.7 4.3 4.1  CL 93* 89* 90* 90* 88* 91* 89*  CO2 29 26 25 27 29 28  32  GLUCOSE 130* 119* 111* 105* 104* 96 88  BUN 21 29* 35* 32* 32* 27* 25*  CREATININE 1.22* 1.82* 1.69* 1.37* 1.32* 1.26* 1.17*  CALCIUM 8.7 8.3* 8.3* 8.7 8.6 8.4 8.4   Cardiac Enzymes: No results found for this basename: CKTOTAL, CKMB, CKMBINDEX, TROPONINI,  in the last 72 hours CBC:  Recent Labs Lab 10/21/13 0645 10/22/13 0515 10/23/13 0635 10/24/13 0510 10/25/13 0420 10/26/13 0640 10/27/13 0502  WBC 4.8 3.7* 2.9* 4.8 5.3 4.7 3.8*  HGB 9.1* 8.2* 7.9* 8.7* 9.1* 9.2* 9.3*  HCT 28.7* 25.3* 24.0* 26.5* 27.8* 27.4* 28.0*  MCV 91.7 92.3 90.2 90.4 90.6 91.0 91.5  PLT 176 185 176 173 167 157 193   PROTIME: No results found for this basename: LABPROT, INR,  in the last 72 hours Liver Function Tests: No results found for this basename: AST, ALT,  ALKPHOS, BILITOT, PROT, ALBUMIN,  in the last 72 hours No results found for this basename: LIPASE, AMYLASE,  in the last 72 hours BNP: BNP (last 3 results)  Recent Labs  08/03/13 2100 10/19/13 0510 10/19/13 1759  PROBNP 612.9* 661.2* 666.3*   D-Dimer: No results found for this basename: DDIMER,  in the last 72 hours Hemoglobin A1C: No results found for this basename: HGBA1C,  in the last 72 hours Fasting Lipid Panel: No results found for this basename: CHOL, HDL, LDLCALC, TRIG, CHOLHDL, LDLDIRECT,  in the last 72 hours Thyroid Function Tests: No results found for this basename: TSH, T4TOTAL, FREET3, T3FREE, THYROIDAB,  in the last 72 hours Anemia Panel:  Recent Labs  10/25/13 0420  RETICCTPCT 0.4        ASSESSMENT AND PLAN:  Active Problems:   Cardiomyopathy due to chemotherapy   AKI (acute kidney injury)   Breast cancer metastasized to bone   Dyspnea   ACS (acute coronary syndrome)   NSTEMI (non-ST elevated myocardial infarction)   Hyperkalemia   Physical deconditioning  Continue Hep and ASA Will need input fro onc as to long term anticoagulation  May benefit from myoview risk stratification depending on progress and prognosis Will recheck troponin today but pain seems largely atypical  Signed, Virl Axe MD  10/27/2013

## 2013-10-28 ENCOUNTER — Ambulatory Visit (HOSPITAL_COMMUNITY): Payer: PRIVATE HEALTH INSURANCE

## 2013-10-28 ENCOUNTER — Inpatient Hospital Stay (HOSPITAL_COMMUNITY): Payer: PRIVATE HEALTH INSURANCE

## 2013-10-28 ENCOUNTER — Ambulatory Visit (HOSPITAL_COMMUNITY): Admission: RE | Admit: 2013-10-28 | Payer: PRIVATE HEALTH INSURANCE | Source: Ambulatory Visit

## 2013-10-28 DIAGNOSIS — R52 Pain, unspecified: Secondary | ICD-10-CM

## 2013-10-28 DIAGNOSIS — R799 Abnormal finding of blood chemistry, unspecified: Secondary | ICD-10-CM

## 2013-10-28 DIAGNOSIS — Z515 Encounter for palliative care: Secondary | ICD-10-CM

## 2013-10-28 LAB — BASIC METABOLIC PANEL
BUN: 33 mg/dL — ABNORMAL HIGH (ref 6–23)
CO2: 31 mEq/L (ref 19–32)
Calcium: 8.6 mg/dL (ref 8.4–10.5)
Chloride: 92 mEq/L — ABNORMAL LOW (ref 96–112)
Creatinine, Ser: 1.45 mg/dL — ABNORMAL HIGH (ref 0.50–1.10)
GFR calc Af Amer: 42 mL/min — ABNORMAL LOW (ref >90)
GFR calc non Af Amer: 36 mL/min — ABNORMAL LOW (ref >90)
Glucose, Bld: 91 mg/dL (ref 70–99)
Potassium: 4.3 mEq/L (ref 3.7–5.3)
Sodium: 132 mEq/L — ABNORMAL LOW (ref 137–147)

## 2013-10-28 MED ORDER — COUMADIN BOOK
Freq: Once | Status: DC
  Filled 2013-10-28: qty 1

## 2013-10-28 MED ORDER — ENOXAPARIN SODIUM 100 MG/ML ~~LOC~~ SOLN
1.0000 mg/kg | Freq: Two times a day (BID) | SUBCUTANEOUS | Status: DC
  Administered 2013-10-28 – 2013-10-29 (×2): 90 mg via SUBCUTANEOUS
  Filled 2013-10-28 (×3): qty 1

## 2013-10-28 MED ORDER — WARFARIN SODIUM 7.5 MG PO TABS
7.5000 mg | ORAL_TABLET | Freq: Once | ORAL | Status: AC
  Administered 2013-10-28: 7.5 mg via ORAL
  Filled 2013-10-28: qty 1

## 2013-10-28 MED ORDER — WARFARIN VIDEO: Freq: Once | Status: DC

## 2013-10-28 MED ORDER — WARFARIN - PHARMACIST DOSING INPATIENT: Freq: Every day | Status: DC

## 2013-10-28 NOTE — Evaluation (Signed)
Occupational Therapy Evaluation Patient Details Name: Jeanette Morris MRN: [id number] DOB: 10-Nov-1945 Today's Date: 10/28/2013 Time: 2778-2423 OT Time Calculation (min): 28 min  OT Assessment / Plan / Recommendation History of present illness Pt is 68 yo female with metastatic breast cancer diagnosed in 2011 and curently on radiation and chemotherapy, last chemo one week ago, off Herceptin since 2014 due to decreased EF (per 2 D ECHO 09/2012 EF 40-45%) and most recent 2 D ECHO 03/2013 with EF 50-55% and grade I diastolic dysfunction, who presented to [facility] ED with main concern of several hours duration of progressively worsening shortness of breath and initially noted with exertion and now present at rest as well, pt explains she is unable to catch a breath. She denies chest pain, no specific abdominal or urinary concerns, no fevers, chills, no specific focal neurological symptoms, no sick contacts or exposures. In ED, pt noted to have elevated troponin at 2.52 and cardiology was consulted. ED doctor considered PE given pt's hypercoagulable state but CT was not done due to lymphedema. Empirically started on IV heparin in ED.    Clinical Impression   Pt admitted with above. She demonstrates the below listed deficits and will benefit from continued OT to maximize safety and independence with BADLs.  Pt with limited activity tolerance as she fatigues quickly.  Currently, pt requires max - total A for BADLs.  Pt with plans to go home, however, unsure if family can provide level of care pt requires.  If they are not able, she may need to consider SNF     OT Assessment  Patient needs continued OT Services    Follow Up Recommendations  Home health OT;Supervision/Assistance - 24 hour (if family unable to provide level of care, will need SNF)    Barriers to Discharge Decreased caregiver support unsure if family able to provide level of care pt will require  Equipment Recommendations  3 in 1 bedside  comode;Hospital bed;Wheelchair (measurements OT)    Recommendations for Other Services    Frequency  Min 2X/week    Precautions / Restrictions Precautions Precautions: Fall Precaution Comments:  Bil UE edema   Pertinent Vitals/Pain     ADL  Eating/Feeding: Moderate assistance Where Assessed - Eating/Feeding: Chair Grooming: Wash/dry face;Minimal assistance Where Assessed - Grooming: Unsupported sitting Upper Body Bathing: Maximal assistance Where Assessed - Upper Body Bathing: Unsupported sitting Lower Body Bathing: +1 Total assistance Where Assessed - Lower Body Bathing: Supine, head of bed up;Supine, head of bed flat;Supported sit to stand Upper Body Dressing: +1 Total assistance Where Assessed - Upper Body Dressing: Unsupported sitting Lower Body Dressing: +1 Total assistance Where Assessed - Lower Body Dressing: Supported sit to Lobbyist: Moderate assistance Toilet Transfer Method: Sit to stand Toileting - Clothing Manipulation and Hygiene: +1 Total assistance Where Assessed - Toileting Clothing Manipulation and Hygiene: Sit to stand from 3-in-1 or toilet Transfers/Ambulation Related to ADLs: mod A sit to stand, and min A to side step up side of bed.   ADL Comments: Pt with leaking of catheter with bedding wet.  Pt moved to EOB, stood x 3 to assist with peri care.  Pt fatigued after and insisted on returning to supine    OT Diagnosis: Generalized weakness;Cognitive deficits  OT Problem List: Decreased strength;Decreased activity tolerance;Impaired balance (sitting and/or standing);Decreased range of motion;Decreased safety awareness;Decreased knowledge of use of DME or AE;Impaired UE functional use;Obesity;Increased edema OT Treatment Interventions: Self-care/ADL training;Therapeutic exercise;Energy conservation;DME and/or AE instruction;Therapeutic activities;Patient/family education;Balance training   OT  Goals(Current goals can be found in the care plan  section) Acute Rehab OT Goals Patient Stated Goal: Pt unable to state OT Goal Formulation: With patient Time For Goal Achievement: 11/11/13 Potential to Achieve Goals: Good ADL Goals Pt Will Transfer to Toilet: with min assist;ambulating;regular height toilet;bedside commode;grab bars Pt Will Perform Toileting - Clothing Manipulation and hygiene: with mod assist;sit to/from stand Additional ADL Goal #1: Caregiveres will demonstrate ability to safely assist pt. with BADLs  Visit Information  Last OT Received On: 10/28/13 Assistance Needed: +2 (if ambulating) History of Present Illness: Pt is 68 yo female with metastatic breast cancer diagnosed in 2011 and curently on radiation and chemotherapy, last chemo one week ago, off Herceptin since 2014 due to decreased EF (per 2 D ECHO 09/2012 EF 40-45%) and most recent 2 D ECHO 03/2013 with EF 50-55% and grade I diastolic dysfunction, who presented to [facility] ED with main concern of several hours duration of progressively worsening shortness of breath and initially noted with exertion and now present at rest as well, pt explains she is unable to catch a breath. She denies chest pain, no specific abdominal or urinary concerns, no fevers, chills, no specific focal neurological symptoms, no sick contacts or exposures. In ED, pt noted to have elevated troponin at 2.52 and cardiology was consulted. ED doctor considered PE given pt's hypercoagulable state but CT was not done due to lymphedema. Empirically started on IV heparin in ED.        Prior Functioning     Home Living Family/patient expects to be discharged to:: Private residence Living Arrangements: Spouse/significant other;Children Available Help at Discharge: Family Type of Home: House Home Access: Stairs to enter Secretary/administrator of Steps: 2 Entrance Stairs-Rails: None Home Layout: One level Home Equipment: Walker - 2 wheels Additional Comments: Pt sponge baths in bathroom on toilet by  spouse or daughter. Pt does not use the shower due to current IV lines for CA treatment.Oxygen 24/7 2 liters.  Pt had been coming to outpatient cancer rehab for compression wrapping for right arm Prior Function Level of Independence: Needs assistance ADL's / Homemaking Assistance Needed: pt requires (A) with sponge bath and toileting at baseline.  Communication Communication: No difficulties Dominant Hand: Right         Vision/Perception Vision - Assessment Vision Assessment: Vision not tested   Cognition  Cognition Arousal/Alertness: Awake/alert Behavior During Therapy: Flat affect Overall Cognitive Status: History of cognitive impairments - at baseline    Extremity/Trunk Assessment Upper Extremity Assessment Upper Extremity Assessment: LUE deficits/detail;RUE deficits/detail RUE Deficits / Details: significant edema noted.  Shoulder 2-/5; elbow distally ~2/5-2+/5 RUE Coordination: decreased fine motor;decreased gross motor LUE Deficits / Details: moderate edema noted.  strength at least 3+/5 Lower Extremity Assessment Lower Extremity Assessment: Defer to PT evaluation     Mobility Bed Mobility Overal bed mobility: Needs Assistance Bed Mobility: Supine to Sit;Sit to Supine Supine to sit: Mod assist Sit to supine: Max assist General bed mobility comments: Pt requires assist for LEs and min A to lift shoulders Transfers Overall transfer level: Needs assistance Transfers: Sit to/from Stand Sit to Stand: Mod assist Stand pivot transfers: Mod assist General transfer comment: Mod A sit to stand, but fatigues quickly      Exercise     Balance Balance Overall balance assessment: Needs assistance Sitting-balance support: Feet supported;Bilateral upper extremity supported Sitting balance-Leahy Scale: Fair Standing balance support: Bilateral upper extremity supported Standing balance-Leahy Scale: Poor   End of Session OT -  End of Session Activity Tolerance: Patient  limited by fatigue Patient left: in bed;with call bell/phone within reach;with bed alarm set Nurse Communication: Mobility status  [place], Ramy Greth M 10/28/2013, 3:14 PM

## 2013-10-28 NOTE — Evaluation (Signed)
SLP Cancellation Note  Patient Details Name: Raechel Marcos MRN: [id number] DOB: 06-30-1946   Cancelled treatment:       Reason Eval/Treat Not Completed: Fatigue/lethargy limiting ability to participate   Claudie Fisherman, [place] [facility] SLP [phone]  M:[id number]}

## 2013-10-28 NOTE — Progress Notes (Signed)
[facility] PROGRESS NOTE  Jeanette Morris OXB:[id number] DOB: 07-Nov-1945 DOA: 10/19/2013 PCP: [name], NP  Assessment/Plan: 1. Acute combined systolic and diastolic congestive heart failure. Patient presenting with dyspnea on exertion, with repeat transthoracic echocardiogram showing an EF of 40-45% with grade 1 diastolic dysfunction. On oral lasix now.   2. Possible Pulmonary Embolism. Unable to perform CT PE study given unability to obtain IV access. Can not access port since it is not a power port. PICC ordered however this could not be placed because of port. I discussed case with Dr Jana Hakim who agreed with prophylactic anticoagulation for 3 months.  3. Chest pain. Patient having chest pain that is reproducible to palpation. She has a history of metastatic breast cancer with multiple lytic metastasis with the presence of rib and right clavicular fractures noted on a CT scan on a 09/03/2013. I think her chest pain is likely skeletal in origin. Palliative care consulted for pain management. 4. Hyperkalemia.Resolved 5. Extremity edema. Venous Dopplers obtained on 10/22/2013 did not reveal evidence of DVT. 6. Acute kidney injury, patient's kidney function continue to improve 7. Hyponatremia. Suspect secondary to volume overloaded state in setting of congestive heart failure, sodium improving to 129 on 10/26/2013.   8. Rash. I think this is likely due to skin changes associated with radiation therapy. Will discontinue Clindamycin and Acyclovir.  9. DVT prophylaxis. On Lovenox 10. Goals of care. I had an extensive family discussion on 10/28/13 with patient and her husband regarding goals of care. I explained to the philosophy of palliative care and treating her pain with possibly minimizing potential further invasive or burdensome procedures if the endpoint would not be changed. They were interested in a palliative care consult as I spoke with Dr. Hilma Favors. Also spoke with her medical  oncologist Dr Jana Hakim who agreed with palliative care consult. For now will start anticoagulation with Coumadin, however I think this will need to be reassessed in the near future depending on what Jeanette Morris would prefer.  Code Status: Full code Family Communication: Husband updated on patient's condition. Tel [phone] Disposition Plan: Palliative Care consulted   Consultants:  Cardiology  Procedures:  Transthoracic echocardiogram performed on 10/20/2013 showing ejection fraction of 40-45%, severe hypokinesis of the distal anteroseptal, anterolateral, inferolateral, inferoseptal and apical myocardium.  Antibiotics:  Acyclovir (discontinued on 10/25/13)  Clindamycin (discontinued on 10/25/13)  HPI/Subjective: Patient is a pleasant 68 year old female with a past medical history of metastatic breast cancer that was diagnosed in 2011, status post chemoradiation therapy, history of chronic diastolic congestive heart failure with transthoracic echocardiogram performed on 04/18/2013 showing ejection fraction of 55-60%, with grade 1 diastolic dysfunction. She presented on 10/19/2013 with complaints of shortness of breath. Initial chest x-ray showed lungs mildly hypoexpanded but grossly clear. She was admitted to the medicine service. Initial troponin elevated at 0.54 which trended up to a peak level of 2.52. She was seen and evaluated by cardiology, started on a heparin drip. Repeat transthoracic echocardiogram on 10/20/2013 showing a depressed ejection fraction of 40-45% based on prior 2-D echo with severe hypokinesis of the distal anteroseptal, anterolateral, inferolateral, inferior septal and apical myocardium. Appearance may be consistent with Takosubo. She remains on IV Lasix 80 mg twice a day, lisinopril 5 mg by mouth daily and carvedilol 6.25 mg by mouth twice a day. On 10/22/13 lab work showed a Hg of 7.9. Since she had also complained of chest pain, she was typed and crossed as 1 unit of  PRBC's were ordered. Her hemoglobin  so improved to 8.7 by 10/24/2013. With regard to possibility of underlying pulmonary embolism I discussed case further with cardiology. In order to obtain a definitive answer PICC line to be placed or access could be obtained through port if it is a power port. Lab work on 10/24/2013 showing improvement to her creatinine as it has trended down to 1.37. Patient complaining of ongoing chest pain and appeared to be atypical in nature. On physical examination chest pain was reproducible to palpation. I suspect that chest pain may have a skeletal origin. A CT scan of chest performed on 09/13/2013 showed multiple lytic or diastasis as well as rib and right clavicular fractures. She was treated with estimated IV narcotic analgesics. I spoke to Dr. Idell Pickles on 10/28/13 regarding anticoagulation and agreed starting prophylactic Coumadin for at least 3 months. Pharmacy was consulted for Coumadin management. I had an extensive family discussion on 10/28/13 with patient and her husband regarding goals of care. I explained to the philosophy of palliative care and treating her pain with possibly minimizing potential further invasive or burdensome procedures if the endpoint would not be changed. They were interested in a palliative care consult as I spoke with Dr. Hilma Favors. Also spoke with her medical oncologist Dr Jana Hakim who agreed with palliative care consult. For now will start anticoagulation with Coumadin, however I think this will need to be reassessed in the near future depending on what Jeanette Morris would prefer.  Objective: Filed Vitals:   10/28/13 1003  BP: 117/50  Pulse: 95  Temp:   Resp:     Intake/Output Summary (Last 24 hours) at 10/28/13 1229 Last data filed at 10/28/13 0700  Gross per 24 hour  Intake 961.17 ml  Output    675 ml  Net 286.17 ml   Filed Weights   10/26/13 0520 10/27/13 0509 10/28/13 0620  Weight: 91.899 kg (202 lb 9.6 oz) 91.944 kg (202 lb 11.2 oz)  91.173 kg (201 lb)    Exam:  General: Patient appears somewhat sedated, she is arousable though did answer questions. Skin: left shoulder macular rash with some papules, TTP and warm to touch  Cardiovascular: Regular rate and rhythm, 99991111, 2/6 systolic ejection murmur, no rubs, no gallops  Respiratory: Clear to auscultation bilaterally, no wheezing, no crackles, no rhonchi  Abdomen: Soft, non tender, non distended, bowel sounds present, no guarding  Extremities: trace bilateral pitting LE edema, pulses DP and PT palpable bilaterally  Neuro: Grossly nonfocal   Data Reviewed: Basic Metabolic Panel:  Recent Labs Lab 10/24/13 0510 10/25/13 0420 10/26/13 0640 10/27/13 0502 10/28/13 0511  NA 126* 126* 129* 131* 132*  K 5.7* 4.7 4.3 4.1 4.3  CL 90* 88* 91* 89* 92*  CO2 27 29 28  32 31  GLUCOSE 105* 104* 96 88 91  BUN 32* 32* 27* 25* 33*  CREATININE 1.37* 1.32* 1.26* 1.17* 1.45*  CALCIUM 8.7 8.6 8.4 8.4 8.6   Liver Function Tests: No results found for this basename: AST, ALT, ALKPHOS, BILITOT, PROT, ALBUMIN,  in the last 168 hours No results found for this basename: LIPASE, AMYLASE,  in the last 168 hours No results found for this basename: AMMONIA,  in the last 168 hours CBC:  Recent Labs Lab 10/23/13 0635 10/24/13 0510 10/25/13 0420 10/26/13 0640 10/27/13 0502  WBC 2.9* 4.8 5.3 4.7 3.8*  HGB 7.9* 8.7* 9.1* 9.2* 9.3*  HCT 24.0* 26.5* 27.8* 27.4* 28.0*  MCV 90.2 90.4 90.6 91.0 91.5  PLT 176 173 167 157 193  Cardiac Enzymes:  Recent Labs Lab 10/27/13 1055  TROPONINI <0.30   BNP (last 3 results)  Recent Labs  08/03/13 2100 10/19/13 0510 10/19/13 1759  PROBNP 612.9* 661.2* 666.3*   CBG: No results found for this basename: GLUCAP,  in the last 168 hours  Recent Results (from the past 240 hour(s))  MRSA PCR SCREENING     Status: None   Collection Time    10/19/13  5:18 PM      Result Value Ref Range Status   MRSA by PCR NEGATIVE  NEGATIVE Final    Comment:            The GeneXpert MRSA Assay (FDA     approved for NASAL specimens     only), is one component of a     comprehensive MRSA colonization     surveillance program. It is not     intended to diagnose MRSA     infection nor to guide or     monitor treatment for     MRSA infections.     Studies: No results found.  Scheduled Meds: . antiseptic oral rinse  15 mL Mouth Rinse BID  . aspirin EC  81 mg Oral Daily  . carvedilol  6.25 mg Oral BID WC  . coumadin book   Does not apply Once  . cyanocobalamin  500 mcg Oral Q breakfast  . docusate sodium  200 mg Oral BID  . enoxaparin (LOVENOX) injection  1 mg/kg Subcutaneous Q12H  . ferrous sulfate  325 mg Oral Q breakfast  . furosemide  40 mg Oral BID  . gabapentin  300 mg Oral QHS  . lisinopril  2.5 mg Oral Daily  . OxyCODONE  40 mg Oral Q12H  . pantoprazole  40 mg Oral Daily  . polyethylene glycol  17 g Oral Daily  . warfarin  7.5 mg Oral ONCE-1800  . warfarin   Does not apply Once  . Warfarin - Pharmacist Dosing Inpatient   Does not apply q1800   Continuous Infusions: . sodium chloride 20 mL/hr at 10/27/13 1653    Active Problems:   ACS (acute coronary syndrome)   NSTEMI (non-ST elevated myocardial infarction)   Cardiomyopathy due to chemotherapy   AKI (acute kidney injury)   Breast cancer metastasized to bone   Dyspnea   Hyperkalemia   Physical deconditioning    Time spent: 35 min    Kelvin Cellar  [facility] Pager [phone]. If 7PM-7AM, please contact night-coverage at [email or web address], password [facility] 10/28/2013, 12:29 PM  LOS: 9 days

## 2013-10-28 NOTE — Progress Notes (Signed)
Per CCMD alert, pt had a tachycardic episode with a 3 beat run of pvcs. Pt was asymptomatic and is sleeping, no distress noted. Pt has an extensive heart history and cardio is following. Will continue to monitor pt closely. Jeanette Morris I

## 2013-10-28 NOTE — Progress Notes (Signed)
PT Cancellation Note  Patient Details Name: Jeanette Morris MRN: [id number] DOB: 1946-01-22   Cancelled Treatment:    Reason Eval/Treat Not Completed: Medical issues which prohibited therapy (palliative care consult, medical issues - cardiac.)   Tresa Endo [phone]  10/28/2013, 12:56 PM

## 2013-10-28 NOTE — Progress Notes (Signed)
10/28/13 1600  Clinical Encounter Type  Visited With Patient not available (no family present)  Visit Type ([place] consult/palliative care)   Attempted visit, but Ms Slaydon was sleeping; no family present.  Please page [phone] as needs arise or as pt/family present/available for visit.  Thank you.  [place], [place]

## 2013-10-28 NOTE — Progress Notes (Addendum)
INITIAL NUTRITION ASSESSMENT  DOCUMENTATION CODES Per approved criteria  -Obesity Unspecified -Severe malnutrition in context of chronic disease  Pt meets criteria for severe MALNUTRITION in the context of chronic illness as evidenced by <75% PO intake for > one month, 11% weight loss in 3 months.   INTERVENTION: Recommend Ensure Complete Q24H Offered nutritional information concerning soft textured high calorie and protein food options Discussed nutrition therapy for nausea Will continue to monitor  NUTRITION DIAGNOSIS: Inadequate oral intake related to swallowing difficulty/nausea as evidenced by PO intake <75% for 3 months, unintentional wt los.   Goal: Pt to meet >/= 90% of their estimated nutrition needs    Monitor:  Total protein/energy intake, labs, weights, swallow profile  Reason for Assessment: MST  68 y.o. female  Admitting Dx: <principal problem not specified>  ASSESSMENT: Pt is 68 yo female with metastatic breast cancer diagnosed in 2011 and curently on radiation and chemotherapy, last chemo one week ago, off Herceptin since 2014 due to decreased EF (per 2 D ECHO 09/2012 EF 40-45%) and most recent 2 D ECHO 03/2013 with EF 50-55% and grade I diastolic dysfunction, who presented to [facility] ED with main concern of several hours duration of progressively worsening shortness of breath and initially noted with exertion and now present at rest as well, pt explains she is unable to catch a breath.   -Pt reported poor PO intake for past 2.5 months d/t difficulty swallowing. Is only able to tolerate soft foods. Diet recall indicates pt consuming large amounts of soups, yogurt, oatmeal. Has been eating 3 small meals/day -Has had some nausea that also contributes to minimal appetite -Husband reported that pt has been on some medication that stimulates appetite; however was unsure type of medication or how long pt has been on medication. No appetite stimulant listed under home med  list -Pt willing to drink Ensure once daily to assist in meeting nutrition needs and skin integrity -Provided "High Protein, High Calorie Nutrition Therapy" handout for husband for additional soft protein/snack ideas -Reported to have lost 10 lbs in past 2.5 months; however per previous medical records, pt has lost 25-30 lbs since 07/2014 -Pt with +3 left and+3 right upper extremity edema -MD noted ARF improving. Palliative care consult pending to determine [place]. Will continue to monitor  Height: Ht Readings from Last 1 Encounters:  10/19/13 $RemoveB'5\' 4"'bAxMPaei$  (1.626 m)    Weight: Wt Readings from Last 1 Encounters:  10/28/13 201 lb (91.173 kg)    Ideal Body Weight: 120 lbs  % Ideal Body Weight: 168%  Wt Readings from Last 10 Encounters:  10/28/13 201 lb (91.173 kg)  10/16/13 214 lb (97.07 kg)  10/02/13 216 lb (97.977 kg)  09/25/13 224 lb (101.606 kg)  09/17/13 216 lb 1.6 oz (98.022 kg)  09/12/13 218 lb 12.8 oz (99.247 kg)  09/04/13 226 lb (102.513 kg)  08/28/13 228 lb 11.2 oz (103.738 kg)  08/14/13 230 lb 1.6 oz (104.373 kg)  08/07/13 231 lb 0.7 oz (104.8 kg)    Usual Body Weight: 210 lbs  % Usual Body Weight: 96%  BMI:  Body mass index is 34.48 kg/(m^2). Obesity I  Estimated Nutritional Needs: Kcal: 2000-2200 Protein: 105-115 gram Fluid: >/=2000 ml/daily  Skin: Stage 3 pressure ulcer on right buttock  Diet Order: General  EDUCATION NEEDS: -No education needs identified at this time   Intake/Output Summary (Last 24 hours) at 10/28/13 1349 Last data filed at 10/28/13 1338  Gross per 24 hour  Intake 841.17 ml  Output    975 ml  Net -133.83 ml    Last BM: 2/27  Labs:   Recent Labs Lab 10/26/13 0640 10/27/13 0502 10/28/13 0511  NA 129* 131* 132*  K 4.3 4.1 4.3  CL 91* 89* 92*  CO2 28 32 31  BUN 27* 25* 33*  CREATININE 1.26* 1.17* 1.45*  CALCIUM 8.4 8.4 8.6  GLUCOSE 96 88 91    CBG (last 3)  No results found for this basename: GLUCAP,  in the last 72  hours  Scheduled Meds: . antiseptic oral rinse  15 mL Mouth Rinse BID  . aspirin EC  81 mg Oral Daily  . carvedilol  6.25 mg Oral BID WC  . coumadin book   Does not apply Once  . cyanocobalamin  500 mcg Oral Q breakfast  . docusate sodium  200 mg Oral BID  . enoxaparin (LOVENOX) injection  1 mg/kg Subcutaneous Q12H  . ferrous sulfate  325 mg Oral Q breakfast  . furosemide  40 mg Oral BID  . gabapentin  300 mg Oral QHS  . lisinopril  2.5 mg Oral Daily  . OxyCODONE  40 mg Oral Q12H  . pantoprazole  40 mg Oral Daily  . polyethylene glycol  17 g Oral Daily  . warfarin  7.5 mg Oral ONCE-1800  . warfarin   Does not apply Once  . Warfarin - Pharmacist Dosing Inpatient   Does not apply q1800    Continuous Infusions: . sodium chloride 20 mL/hr at 10/27/13 1653    Past Medical History  Diagnosis Date  . Hypertension   . Hearing loss     right ear  . Hyperlipidemia   . Arthritis   . Dysrhythmia     A fib with IV chemo treatments  . Shortness of breath     with exertion  . 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 Breast cancer     (Rt) breast ca dx 9/11  . 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 Neuropathy   . History of radiation therapy 01/18/11-03/07/11    5940 cGy to Right chest wall, axilla and supraclavicular region  . Lymphedema of arm 08/28/2013  . Tuberculosis     15 yrs ago- treated for 1 yeat  . History of radiation therapy 09/20/13, 09/23/13, 09/24/13    12 gray t o left lower chest region    Past Surgical History  Procedure Laterality Date  . Tubal ligation    . Portacath placement Left   . Abdominal hysterectomy    . Lung biopsy    . Lesion excision      L anterior thigh  . Breast surgery  12/01/10    ER+,PR+,Ki 67 64%,Her2 -; right breast lumpectomy  . Chest tube insertion  08/08/2012    Procedure: INSERTION PLEURAL DRAINAGE CATHETER;  Surgeon: Gaye Pollack, MD;  Location: [facility];  Service: Thoracic;  Laterality: Right;  . Removal of pleural drainage catheter Right  10/10/2012    Procedure: REMOVAL OF PLEURAL DRAINAGE CATHETER;  Surgeon: Gaye Pollack, MD;  Location: [facility];  Service: Thoracic;  Laterality: Right;  . Mastectomy, radical Left 05/08/2013    Dr Marlou Starks  . Mastectomy modified radical Left 05/08/2013    Procedure: MASTECTOMY MODIFIED RADICAL;  Surgeon: Merrie Roof, MD;  Location: [facility];  Service: General;  Laterality: Left;    Atlee Abide MS RD LDN Clinical Dietitian [phone]

## 2013-10-28 NOTE — Consult Note (Signed)
Patient Jeanette Morris      DOB: June 13, 1946      IWP:[id number]     Consult Note from the Palliative Medicine Team at [facility] Requested by: Dr Coralyn Pear     PCP: [name], NP Reason for Consultation:Clatrificzation of [place] and options     Phone [phone]  Assessment of patients Current state: 68 year old female with history of hypertension, hyperlipidemia,  breast CA with invasive ductal carcinoma status post left modified radical mastectomy in 9/ 2014 and a right mastectomy in 11/2010 to currently on chemotherapy presented to the ED with a one week cough progressive pain over her left breast area. She reports the pain to be dull aching but severe and nonradiating. Patient at home is on OxyContin 40 mg twice daily (which was increased by Dr. Jana Hakim last week), and oxycodone 5 mg every 4 hours as needed but did not improve her pain symptoms.  diagnosed in September of 2011 with mild to moderate systolic dysfunction, EF in the 40-50% range dating back from 2003 with Herceptin being stopped in January of 2014 do to concern of once again decreasing ejection fraction   Continued physical, functional and cognitive decline, overall poor prognosis   Patient and family faced with Advanced Directive and anticipatory care need decisions.   This NP Wadie Lessen reviewed medical records, received report from team, assessed the patient and then meet at the patient's bedside along with her husband [name] C# [phone]/ H# [phone]  to discuss diagnosis prognosis, GOC, EOL wishes disposition and options.   A detailed discussion was had today regarding advanced directives.  Concepts specific to code status, artifical feeding and hydration, continued IV antibiotics and rehospitalization was had.  The difference between a aggressive medical intervention path  and a palliative comfort care path for this patient at this time was had.  Values and goals of care  important to patient and family were attempted to be elicited.  Concept of Hospice and Palliative Care were discussed  Natural trajectory and expectations at EOL were discussed.  Questions and concerns addressed.  Hard Choices booklet left for review. Family encouraged to call with questions or concerns.  PMT will continue to support holistically.  Patient was unable to interact due to lethargy.   Plan is to speak with her daughters for further discussion      Goals of Care: 1.  Code Status:   Full code   2. Scope of Treatment:  1.  At this time focus of care to prolongation of life.  Consideration of further chemo and radiation if offered.  Husband expresses he understands the importance of further discussion regarding course  of care and needs his step-daughters involvement   4. Disposition:  Dependant on outcomes   3. Symptom Management:   1. Weakness/Failure to thrive 2. Pain:  Dilaudid 2 mg IV every 3 hrs prn-previously written by attending 3. Nausea/Vomiting: Zofran 4 mg IV every 6 hrs prn  4. Psychosocial:  Emotional support to husband at bedside.  He has been married to the patient for 30 yrs, they have no children together.  He expresses concern that all family members "do not understand" the overall poor prognosis,  and to avoid any  miscommunication requests that I have a similar conversation with the patient's  daughter [name].  He will get her to call me at her convenience. He verbalizes appreciation for today's conversation.   5. Spiritual:  Spiritual care consulted   Patient  Documents Completed or Given: Document Given Completed  Advanced Directives Pkt    MOST    DNR    Gone from My Sight    Hard Choices yes     Brief HPI: 68 year old female with history of hypertension, hyperlipidemia,  breast CA with invasive ductal carcinoma status post left modified radical mastectomy in 9/ 2014 and a right mastectomy in 11/2010 to currently on chemotherapy  presented to the ED with a one week cough progressive pain over her left breast area. She reports the pain to be dull aching but severe and nonradiating. Patient at home is on OxyContin 40 mg twice daily (which was increased by Dr. Jana Hakim last week), and oxycodone 5 mg every 4 hours as needed but did not improve her pain symptoms.  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 Past thoracentesis performed removing 1.6 L of serous fluid. 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     PMH:  Past Medical History  Diagnosis Date  . Hypertension   . Hearing loss     right ear  . Hyperlipidemia   . Arthritis   . Dysrhythmia     A fib with IV chemo treatments  . Shortness of breath     with exertion  . 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 Breast cancer     (Rt) breast ca dx 9/11  . 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 Neuropathy   . History of radiation therapy 01/18/11-03/07/11    5940 cGy to Right chest wall, axilla and supraclavicular region  . Lymphedema of arm 08/28/2013  . Tuberculosis     15 yrs ago- treated for 1 yeat  . History of radiation therapy 09/20/13, 09/23/13, 09/24/13    12 gray t o left lower chest region     PSH: Past Surgical History  Procedure Laterality Date  . Tubal ligation    . Portacath placement Left   . Abdominal hysterectomy    . Lung biopsy    . Lesion excision      L anterior thigh  . Breast surgery  12/01/10    ER+,PR+,Ki 67 64%,Her2 -; right breast lumpectomy  . Chest tube insertion  08/08/2012    Procedure: INSERTION PLEURAL DRAINAGE CATHETER;  Surgeon: Gaye Pollack, MD;  Location: [facility];  Service: Thoracic;  Laterality: Right;  . Removal of pleural drainage catheter Right 10/10/2012    Procedure: REMOVAL OF PLEURAL DRAINAGE CATHETER;  Surgeon: Gaye Pollack, MD;  Location: [facility];  Service: Thoracic;  Laterality: Right;  . Mastectomy, radical Left 05/08/2013    Dr Marlou Starks  . Mastectomy modified radical Left 05/08/2013    Procedure: MASTECTOMY MODIFIED RADICAL;  Surgeon: Merrie Roof, MD;  Location: [facility];  Service: General;  Laterality: Left;   I have reviewed the [place] and SH and  If appropriate update it with new information. No Known Allergies Scheduled Meds: . antiseptic oral rinse  15 mL Mouth Rinse BID  . aspirin EC  81 mg Oral Daily  . carvedilol  6.25 mg Oral BID WC  .  coumadin book   Does not apply Once  . cyanocobalamin  500 mcg Oral Q breakfast  . docusate sodium  200 mg Oral BID  . enoxaparin (LOVENOX) injection  1 mg/kg Subcutaneous Q12H  . ferrous sulfate  325 mg Oral Q breakfast  . furosemide  40 mg Oral BID  . gabapentin  300 mg Oral QHS  . lisinopril  2.5 mg Oral Daily  . OxyCODONE  40 mg Oral Q12H  . pantoprazole  40 mg Oral Daily  . polyethylene glycol  17 g Oral Daily  . warfarin  7.5 mg Oral ONCE-1800  . warfarin   Does not apply Once  . Warfarin - Pharmacist Dosing Inpatient   Does not apply q1800   Continuous Infusions: . sodium chloride 20 mL/hr at 10/27/13 1653   PRN Meds:.HYDROmorphone (DILAUDID) injection, lactulose, nitroGLYCERIN, ondansetron, oxyCODONE, sodium chloride    BP 117/50  Pulse 95  Temp(Src) 98.3 F (36.8 C) (Oral)  Resp 20  Ht _0  (1.626 m)  Wt 201 lb (91.173 kg)  BMI 34.48 kg/m2  SpO2 100%   PPS:30 % at best   Intake/Output Summary (Last 24 hours) at 10/28/13 1252 Last data filed at 10/28/13 0700  Gross per 24 hour  Intake 961.17 ml  Output    675 ml  Net 286.17 ml     Physical Exam:  General: ill appearing, lethargic HEENT:  Mm, no exudate Chest:   Decreased in bases, CTA CVS: RRR Abdomen:  soft NT +BS Ext: all extremities with + 1 edema  Neuro: lethargic, oriented to person and place, able to follow simple commands  Labs: CBC    Component Value Date/Time   WBC 3.8* 10/27/2013 0502   WBC 16.4* 10/16/2013 1210   RBC 3.06* 10/27/2013 0502   RBC 3.07* 10/25/2013 0420   RBC 3.63* 10/16/2013 1210   HGB 9.3* 10/27/2013 0502   HGB 10.6* 10/16/2013 1210   HCT 28.0* 10/27/2013 0502   HCT 33.3* 10/16/2013 1210   PLT 193 10/27/2013 0502   PLT 198 10/16/2013 1210   MCV 91.5 10/27/2013 0502   MCV 91.6 10/16/2013 1210   MCH 30.4 10/27/2013 0502   MCH 29.1 10/16/2013 1210   MCHC 33.2 10/27/2013 0502   MCHC 31.8 10/16/2013 1210   RDW 14.6 10/27/2013 0502   RDW 17.0* 10/16/2013 1210   LYMPHSABS 0.9 10/16/2013 1210   LYMPHSABS 1.3 10/15/2013 1917   MONOABS 0.8 10/16/2013 1210   MONOABS 1.1* 10/15/2013 1917   EOSABS 0.0 10/16/2013 1210   EOSABS 0.0 10/15/2013 1917   BASOSABS 0.0 10/16/2013 1210   BASOSABS 0.0 10/15/2013 1917    BMET    Component Value Date/Time   NA 132* 10/28/2013 0511   NA 134* 10/16/2013 1211   K 4.3 10/28/2013 0511   K 5.1 10/16/2013 1211   CL 92* 10/28/2013 0511   CL 106 02/14/2013 1048   CO2 31 10/28/2013 0511   CO2 25 10/16/2013 1211   GLUCOSE 91 10/28/2013 0511   GLUCOSE 118 10/16/2013 1211   GLUCOSE 109* 02/14/2013 1048   BUN 33* 10/28/2013 0511   BUN 9.6 10/16/2013 1211   CREATININE 1.45* 10/28/2013 0511   CREATININE 0.9 10/16/2013 1211   CALCIUM 8.6 10/28/2013 0511   CALCIUM 8.0* 10/16/2013 1211   GFRNONAA 36* 10/28/2013 0511   GFRAA 42* 10/28/2013 0511    CMP     Component Value Date/Time   NA 132* 10/28/2013 0511   NA 134* 10/16/2013 1211   K 4.3  10/28/2013 0511   K 5.1 10/16/2013 1211   CL 92* 10/28/2013 0511   CL 106 02/14/2013 1048   CO2 31 10/28/2013 0511   CO2 25 10/16/2013 1211   GLUCOSE 91 10/28/2013 0511   GLUCOSE 118 10/16/2013 1211   GLUCOSE 109* 02/14/2013 1048   BUN 33* 10/28/2013 0511   BUN 9.6 10/16/2013 1211   CREATININE 1.45* 10/28/2013 0511   CREATININE 0.9 10/16/2013 1211   CALCIUM  8.6 10/28/2013 0511   CALCIUM 8.0* 10/16/2013 1211   PROT 6.7 10/19/2013 0511   PROT 6.5 10/16/2013 1211   ALBUMIN 2.7* 10/19/2013 0511   ALBUMIN 2.7* 10/16/2013 1211   AST 40* 10/19/2013 0511   AST 30 10/16/2013 1211   ALT 14 10/19/2013 0511   ALT 14 10/16/2013 1211   ALKPHOS 76 10/19/2013 0511   ALKPHOS 92 10/16/2013 1211   BILITOT 0.7 10/19/2013 0511   BILITOT 0.55 10/16/2013 1211   GFRNONAA 36* 10/28/2013 0511   GFRAA 42* 10/28/2013 0511       Time In Time Out Total Time Spent with Patient Total Overall Time  1200 1330 80 min 90 min    Greater than 50%  of this time was spent counseling and coordinating care related to the above assessment and plan.  Discussed with Dr Virginia Crews NP  Palliative Medicine Team Team Phone # [phone] Pager [phone]

## 2013-10-28 NOTE — Progress Notes (Addendum)
       Patient Name: Jeanette Morris Date of Encounter: 10/28/2013    SUBJECTIVE: No cardiac complaints. No chest pain this AM  TELEMETRY:  NSR: Filed Vitals:   10/27/13 1405 10/27/13 2345 10/28/13 0035 10/28/13 0620  BP: 147/64 115/46 105/51 96/57  Pulse: 99 96  100  Temp: 98.1 F (36.7 C) 98.5 F (36.9 C)  98.3 F (36.8 C)  TempSrc: Oral Oral  Oral  Resp: 20 20  20   Height:      Weight:    201 lb (91.173 kg)  SpO2: 100% 100%  100%    Intake/Output Summary (Last 24 hours) at 10/28/13 0850 Last data filed at 10/28/13 0700  Gross per 24 hour  Intake 961.17 ml  Output    675 ml  Net 286.17 ml    LABS: Basic Metabolic Panel:  Recent Labs  10/27/13 0502 10/28/13 0511  NA 131* 132*  K 4.1 4.3  CL 89* 92*  CO2 32 31  GLUCOSE 88 91  BUN 25* 33*  CREATININE 1.17* 1.45*  CALCIUM 8.4 8.6   CBC:  Recent Labs  10/26/13 0640 10/27/13 0502  WBC 4.7 3.8*  HGB 9.2* 9.3*  HCT 27.4* 28.0*  MCV 91.0 91.5  PLT 157 193   Cardiac Enzymes:  Recent Labs  10/27/13 1055  TROPONINI <0.30   ECHO 09/2013  Study Conclusions  - Left ventricle: The cavity size was normal. There was moderate concentric hypertrophy. Systolic function was mildly to moderately reduced. The estimated ejection fraction was in the range of 40% to 45%. There is severe hypokinesis of the distal anteroseptal, anterolateral, inferolateral, inferoseptal, and apical myocardium. Has appearance of stress induced cardiomyopathy, Takotsubo. Doppler parameters are consistent with abnormal left ventricular relaxation (grade 1 diastolic dysfunction). - Pulmonary arteries: Systolic pressure was mildly to moderately increased. PA peak pressure: 54mm Hg (S).    Radiology/Studies:  No new data  Physical Exam: Blood pressure 96/57, pulse 100, temperature 98.3 F (36.8 C), temperature source Oral, resp. rate 20, height 5\' 4"  (1.626 m), weight 201 lb (91.173 kg), SpO2 100.00%. Weight change: -1 lb 11.2  oz (-0.771 kg)   No pericardial rub  ASSESSMENT:  1. 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 NSTEMI, ? Stress CM vs CAD with prior MI/ischemia 3. Metastatic breast CA  Plan:  1. We need guidance about how aggressive to be. 2. No ischemic w/u at this time unless others feel differently  Signed, Sinclair Grooms 10/28/2013, 8:50 AM

## 2013-10-28 NOTE — Progress Notes (Signed)
ANTICOAGULATION CONSULT NOTE - Follow Up Consult  Pharmacy Consult: Lovenox/Warfarin  Indication: suspected PE  No Known Allergies  Patient Measurements: Height: 5\' 4"  (162.6 cm) Weight: 201 lb (91.173 kg) IBW/kg (Calculated) : 54.7 Heparin Dosing Weight: 77 kg  Vital Signs: Temp: 98.3 F (36.8 C) (03/02 0620) Temp src: Oral (03/02 0620) BP: 117/50 mmHg (03/02 1003) Pulse Rate: 95 (03/02 1003)  Labs:  Recent Labs  10/26/13 0640 10/27/13 0502 10/27/13 1055 10/28/13 0511  HGB 9.2* 9.3*  --   --   HCT 27.4* 28.0*  --   --   PLT 157 193  --   --   CREATININE 1.26* 1.17*  --  1.45*  TROPONINI  --   --  <0.30  --     Estimated Creatinine Clearance: 40.1 ml/min (by C-G formula based on Cr of 1.45).   Medications:  Scheduled:  . antiseptic oral rinse  15 mL Mouth Rinse BID  . aspirin EC  81 mg Oral Daily  . carvedilol  6.25 mg Oral BID WC  . cyanocobalamin  500 mcg Oral Q breakfast  . docusate sodium  200 mg Oral BID  . enoxaparin (LOVENOX) injection  1 mg/kg Subcutaneous Q12H  . ferrous sulfate  325 mg Oral Q breakfast  . furosemide  40 mg Oral BID  . gabapentin  300 mg Oral QHS  . lisinopril  2.5 mg Oral Daily  . OxyCODONE  40 mg Oral Q12H  . pantoprazole  40 mg Oral Daily  . polyethylene glycol  17 g Oral Daily   Infusions:  . sodium chloride 20 mL/hr at 10/27/13 1653    Assessment: 68 yo female with metastatic breast cancer curently on radiation and chemotherapy. Admitted 2/21 with shortness of breath. Anticoagulation for possible PE, unable to perform CT/angio.  Pharmacy re-consulted today to start warfarin for long term anticoagulation.  Today is D#1/5 Minimum overlap lovenox/warfarin since treating as new VTE  IV heparin 2/21-2/26, Then transitioned to Lovenox 95 mg Q12h on 2/27.  Weight updated 91.2 kg  Doppler neg for DVT 2/24.  Hgb/Plt okay 3/1, No bleeding reported per RN.  ARF, was improving but Scr is now rising again 1.45 CrCl 40 ml/min, BMET  in AM  Baseline INR is WNL 1.07 on 2/21   Goal of Therapy:  Heparin level 0.3-0.7 units/ml Monitor platelets by anticoagulation protocol: Yes   Plan:   Reduce Lovenox to 90 mg Q12h (1 mg/kg, weight updated on 3/2)  Start Warfarin 7.5 mg x 1 tonight at 1800, warfarin book, video   Daily PT/INR, CBC Q72h  Will education patient on warfarin   Glee Arvin PharmD Pager #: [phone] 10:24 AM 10/28/2013

## 2013-10-29 ENCOUNTER — Other Ambulatory Visit: Payer: Self-pay

## 2013-10-29 DIAGNOSIS — E43 Unspecified severe protein-calorie malnutrition: Secondary | ICD-10-CM

## 2013-10-29 DIAGNOSIS — R627 Adult failure to thrive: Secondary | ICD-10-CM

## 2013-10-29 LAB — BASIC METABOLIC PANEL
BUN: 35 mg/dL — ABNORMAL HIGH (ref 6–23)
CO2: 30 meq/L (ref 19–32)
CREATININE: 1.32 mg/dL — AB (ref 0.50–1.10)
Calcium: 8.3 mg/dL — ABNORMAL LOW (ref 8.4–10.5)
Chloride: 91 mEq/L — ABNORMAL LOW (ref 96–112)
GFR calc Af Amer: 47 mL/min — ABNORMAL LOW (ref >90)
GFR calc non Af Amer: 40 mL/min — ABNORMAL LOW (ref >90)
GLUCOSE: 94 mg/dL (ref 70–99)
Potassium: 4.2 mEq/L (ref 3.7–5.3)
Sodium: 130 mEq/L — ABNORMAL LOW (ref 137–147)

## 2013-10-29 LAB — PROTIME-INR
INR: 1.13 (ref 0.00–1.49)
Prothrombin Time: 14.3 seconds (ref 11.6–15.2)

## 2013-10-29 MED ORDER — HYDROMORPHONE HCL PF 1 MG/ML IJ SOLN: 1.0000 mg | INTRAMUSCULAR | Status: DC | PRN

## 2013-10-29 MED ORDER — HYDROMORPHONE HCL PF 2 MG/ML IJ SOLN
2.0000 mg | INTRAMUSCULAR | Status: DC | PRN
  Administered 2013-10-29 (×3): 2 mg via INTRAVENOUS
  Filled 2013-10-29 (×2): qty 1

## 2013-10-29 MED ORDER — FUROSEMIDE 40 MG PO TABS
40.0000 mg | ORAL_TABLET | Freq: Every day | ORAL | Status: DC
  Administered 2013-10-30: 40 mg via ORAL
  Filled 2013-10-29 (×3): qty 1

## 2013-10-29 MED ORDER — WARFARIN SODIUM 7.5 MG PO TABS
7.5000 mg | ORAL_TABLET | Freq: Once | ORAL | Status: DC
  Filled 2013-10-29: qty 1

## 2013-10-29 MED ORDER — SODIUM CHLORIDE 0.9 % IV SOLN
0.5000 mg/h | INTRAVENOUS | Status: DC
  Administered 2013-10-29 – 2013-11-01 (×5): 0.5 mg/h via INTRAVENOUS
  Filled 2013-10-29 (×3): qty 2.5

## 2013-10-29 MED ORDER — METOPROLOL TARTRATE 1 MG/ML IV SOLN
5.0000 mg | Freq: Once | INTRAVENOUS | Status: AC
  Administered 2013-10-29: 5 mg via INTRAVENOUS
  Filled 2013-10-29: qty 5

## 2013-10-29 MED ORDER — HYDROMORPHONE BOLUS VIA INFUSION
1.0000 mg | INTRAVENOUS | Status: DC | PRN
  Administered 2013-10-29 – 2013-11-01 (×12): 1 mg via INTRAVENOUS
  Filled 2013-10-29: qty 1

## 2013-10-29 MED ORDER — ENOXAPARIN SODIUM 40 MG/0.4ML ~~LOC~~ SOLN
40.0000 mg | SUBCUTANEOUS | Status: DC
  Filled 2013-10-29: qty 0.4

## 2013-10-29 NOTE — Progress Notes (Signed)
Progress Note from the Palliative Medicine Team at [facility]:  patient is lethargic but alert and oriented, husband at bedside   -conversation with both at bedside and patient is able to verbalize her desire for comfort as moan focus of care  -to ensure communication between all family members a meeting is scheduled for tomorrow morning at 90, this will clarify GOC and disposition options for this pateint     Objective: No Known Allergies Scheduled Meds: . antiseptic oral rinse  15 mL Mouth Rinse BID  . aspirin EC  81 mg Oral Daily  . carvedilol  6.25 mg Oral BID WC  . cyanocobalamin  500 mcg Oral Q breakfast  . docusate sodium  200 mg Oral BID  . ferrous sulfate  325 mg Oral Q breakfast  . [START ON 10/30/2013] furosemide  40 mg Oral Daily  . gabapentin  300 mg Oral QHS  . OxyCODONE  40 mg Oral Q12H  . pantoprazole  40 mg Oral Daily  . polyethylene glycol  17 g Oral Daily   Continuous Infusions: . sodium chloride 20 mL/hr at 10/28/13 2300  . HYDROmorphone     PRN Meds:.HYDROmorphone (DILAUDID) injection, lactulose, nitroGLYCERIN, ondansetron, oxyCODONE, sodium chloride  BP 119/71  Pulse 99  Temp(Src) 97.5 F (36.4 C) (Oral)  Resp 18  Ht 5\' 4"  (1.626 m)  Wt 90.311 kg (199 lb 1.6 oz)  BMI 34.16 kg/m2  SpO2 100%   PPS:30 %  Pain Score:  C/o  chest pain and lower pain pain   Intake/Output Summary (Last 24 hours) at 11/08/2013 1531 Last data filed at 10/31/2013 1439  Gross per 24 hour  Intake    780 ml  Output    875 ml  Net    -95 ml       Physical Exam:  General: ill appearing, generally uncomfortable, NAD HEENT:  Mm, no exudate Chest:   Decreased in bases  CTA CVS: tachycardic and irregular Abdomen: soft NT +BS Ext:  RUE +3 edema, ROL +2 edeam Neuro: oriented X3, verbalizes understanding of overall poor prognosis  Labs: CBC    Component Value Date/Time   WBC 3.8* 10/27/2013 0502   WBC 16.4* 10/16/2013 1210   RBC 3.06* 10/27/2013 0502   RBC  3.07* 10/25/2013 0420   RBC 3.63* 10/16/2013 1210   HGB 9.3* 10/27/2013 0502   HGB 10.6* 10/16/2013 1210   HCT 28.0* 10/27/2013 0502   HCT 33.3* 10/16/2013 1210   PLT 193 10/27/2013 0502   PLT 198 10/16/2013 1210   MCV 91.5 10/27/2013 0502   MCV 91.6 10/16/2013 1210   MCH 30.4 10/27/2013 0502   MCH 29.1 10/16/2013 1210   MCHC 33.2 10/27/2013 0502   MCHC 31.8 10/16/2013 1210   RDW 14.6 10/27/2013 0502   RDW 17.0* 10/16/2013 1210   LYMPHSABS 0.9 10/16/2013 1210   LYMPHSABS 1.3 10/15/2013 1917   MONOABS 0.8 10/16/2013 1210   MONOABS 1.1* 10/15/2013 1917   EOSABS 0.0 10/16/2013 1210   EOSABS 0.0 10/15/2013 1917   BASOSABS 0.0 10/16/2013 1210   BASOSABS 0.0 10/15/2013 1917    BMET    Component Value Date/Time   NA 130* 11/16/2013 0435   NA 134* 10/16/2013 1211   K 4.2 11/01/2013 0435   K 5.1 10/16/2013 1211   CL 91* 11/07/2013 0435   CL 106 02/14/2013 1048   CO2 30 10/30/2013 0435   CO2 25 10/16/2013 1211   GLUCOSE 94 10/31/2013 0435   GLUCOSE 118 10/16/2013  1211   GLUCOSE 109* 02/14/2013 1048   BUN 35* 11/13/2013 0435   BUN 9.6 10/16/2013 1211   CREATININE 1.32* 11/16/2013 0435   CREATININE 0.9 10/16/2013 1211   CALCIUM 8.3* 11/17/2013 0435   CALCIUM 8.0* 10/16/2013 1211   GFRNONAA 40* 11/25/2013 0435   GFRAA 47* 11/12/2013 0435    CMP     Component Value Date/Time   NA 130* 11/10/2013 0435   NA 134* 10/16/2013 1211   K 4.2 10/27/2013 0435   K 5.1 10/16/2013 1211   CL 91* 11/26/2013 0435   CL 106 02/14/2013 1048   CO2 30 11/09/2013 0435   CO2 25 10/16/2013 1211   GLUCOSE 94 11/10/2013 0435   GLUCOSE 118 10/16/2013 1211   GLUCOSE 109* 02/14/2013 1048   BUN 35* 11/11/2013 0435   BUN 9.6 10/16/2013 1211   CREATININE 1.32* 11/23/2013 0435   CREATININE 0.9 10/16/2013 1211   CALCIUM 8.3* 11/15/2013 0435   CALCIUM 8.0* 10/16/2013 1211   PROT 6.7 10/19/2013 0511   PROT 6.5 10/16/2013 1211   ALBUMIN 2.7* 10/19/2013 0511   ALBUMIN 2.7* 10/16/2013 1211   AST 40* 10/19/2013 0511   AST 30 10/16/2013 1211   ALT 14 10/19/2013 0511   ALT 14 10/16/2013  1211   ALKPHOS 76 10/19/2013 0511   ALKPHOS 92 10/16/2013 1211   BILITOT 0.7 10/19/2013 0511   BILITOT 0.55 10/16/2013 1211   GFRNONAA 40* 11/15/2013 0435   GFRAA 47* 11/01/2013 0435      Assessment and Plan: 1. Code Status:DNR/DNI-comfort is main focus of care 2. Symptom Control:         Recommend Dilaudid gtt; start at 0.5 mg/hr continuous with bolus for breakthru  3. Psycho/Social:  Emotional support offered to patient and husband 4. Spiritual  Chaplain service consulted 5. Disposition:  Dependant on meeting tomorrow morning, leaning toward inpatient hospice facility  Patient Documents Completed or Given: Document Given Completed  Advanced Directives Pkt    MOST    DNR    Gone from My Sight    Hard Choices yes     Time In Time Out Total Time Spent with Patient Total Overall Time  1525 1600 35 min 35 min    Greater than 50%  of this time was spent counseling and coordinating care related to the above assessment and plan.  Wadie Lessen NP  Palliative Medicine Team Team Phone # [phone] Pager [phone]  Discussed with Dr Coralyn Pear 1

## 2013-10-29 NOTE — Progress Notes (Signed)
PT Cancellation Note  Patient Details Name: Sophiana Milanese MRN: [id number] DOB: 1945-09-26   Cancelled Treatment:    Reason Eval/Treat Not Completed: Medical issues which prohibited therapy (not responsive, family considering residential hospice.)   Claretha Cooper 10/28/2013, 3:35 PM [phone]

## 2013-10-29 NOTE — Progress Notes (Addendum)
[facility] PROGRESS NOTE  Jeanette Morris ZOX:[id number] DOB: 04/15/1946 DOA: 10/19/2013 PCP: [name], NP  Interim Summary Patient is a pleasant 68 year old female with a past medical history of metastatic breast cancer that was diagnosed in 2011, status post chemoradiation therapy, history of chronic diastolic congestive heart failure with transthoracic echocardiogram performed on 04/18/2013 showing ejection fraction of 55-60%, with grade 1 diastolic dysfunction. She presented on 10/19/2013 with complaints of shortness of breath. Initial chest x-ray showed lungs mildly hypoexpanded but grossly clear. She was admitted to the medicine service. Initial troponin elevated at 0.54 which trended up to a peak level of 2.52. She was seen and evaluated by cardiology, started on a heparin drip. Repeat transthoracic echocardiogram on 10/20/2013 showing a depressed ejection fraction of 40-45% based on prior 2-D echo with severe hypokinesis of the distal anteroseptal, anterolateral, inferolateral, inferior septal and apical myocardium. Appearance may be consistent with Takosubo. She remains on IV Lasix 80 mg twice a day, lisinopril 5 mg by mouth daily and carvedilol 6.25 mg by mouth twice a day. On 10/22/13 lab work showed a Hg of 7.9. Since she had also complained of chest pain, she was typed and crossed as 1 unit of PRBC's were ordered. Her hemoglobin so improved to 8.7 by 10/24/2013.  With regard to possibility of underlying pulmonary embolism I discussed case further with cardiology.  In order to obtain a definitive answer PICC line to be placed or access could be obtained through port if it is a power port. A CT scan of lungs PE study could not be obtained as she did not have a power port and PICC line could not be inserted due to port. Given these difficulties the thought initially was to treat emperically for 3 months.  Patient complaining of ongoing chest pain throughout this hospitalization.  On  physical examination chest pain was reproducible to palpation. I suspect that chest pain may have a skeletal origin. A CT scan of chest performed on 09/13/2013 showed multiple lytic or diastasis as well as rib and right clavicular fractures. She was treated with estimated IV narcotic analgesics.  I had an extensive family discussion on 10/28/13 with patient and her husband regarding goals of care. I explained to the philosophy of palliative care and treating her pain with possibly minimizing potential further invasive or burdensome procedures if the endpoint would not be changed. They were interested in a palliative care consult as I spoke with Dr. Hilma Favors. Also spoke with her medical oncologist Dr Jana Hakim who agreed with palliative care consult. On November 01, 2013 a family meeting was held with Dr.Magrinat to discuss end-of-life care. Despite maximizing medical therapy, she has continued to decline in setting of terminal disease. Inpatient hospice facility was recommended as her family members are considering [facility]. In the meantime her CODE STATUS was changed to a DO NOT RESUSCITATE. Social work consult was placed for potential referral to inpatient hospice.  Assessment/Plan: 1. Acute combined systolic and diastolic congestive heart failure. Patient presenting with dyspnea on exertion, with repeat transthoracic echocardiogram showing an EF of 40-45% with grade 1 diastolic dysfunction. On oral lasix now.   2. ? Pulmonary Embolism. Workup could not be accomplished dude to inability to obtain IV access for contrast administration. It appears that she will transition to comfort care and emperic anticoagulation in this setting may offer little benefit.  3. Chest pain. Patient having chest pain that is reproducible to palpation. She has a history of metastatic breast cancer with multiple lytic metastasis  with the presence of rib and right clavicular fractures noted on a CT scan on a 09/03/2013. I think her chest  pain is likely skeletal in origin. Palliative care consulted for pain management. 4. Hyperkalemia.Resolved 5. Extremity edema. Venous Dopplers obtained on 10/22/2013 did not reveal evidence of DVT. 6. Acute kidney injury, patient's kidney function continue to improve 7. Hyponatremia. Suspect secondary to volume overloaded state in setting of congestive heart failure, sodium improving to 129 on 10/26/2013.   8. Rash. I think this is likely due to skin changes associated with radiation therapy. Will discontinue Clindamycin and Acyclovir.  9. DVT prophylaxis. On Lovenox subcutaneous  Code Status: Full code Family Communication: Husband updated on patient's condition. Tel [phone] Disposition Plan: Family members and patient considering transitioning to comfort care, and possibly continuing her care at inpatient hospice facility. Social work has been consulted to arrange for possible discharge to inpatient hospice.   Consultants:  Cardiology  Procedures:  Transthoracic echocardiogram performed on 10/20/2013 showing ejection fraction of 40-45%, severe hypokinesis of the distal anteroseptal, anterolateral, inferolateral, inferoseptal and apical myocardium.  Antibiotics:  Acyclovir (discontinued on 10/25/13)  Clindamycin (discontinued on 10/25/13)  HPI/Subjective:  Objective: Filed Vitals:   2013/11/23 0900  BP: 109/44  Pulse: 57  Temp:   Resp:     Intake/Output Summary (Last 24 hours) at 11-23-13 1212 Last data filed at 2013/11/23 0700  Gross per 24 hour  Intake   1020 ml  Output    950 ml  Net     70 ml   Filed Weights   10/27/13 0509 10/28/13 0620 2013-11-23 0653  Weight: 91.944 kg (202 lb 11.2 oz) 91.173 kg (201 lb) 90.311 kg (199 lb 1.6 oz)    Exam:  General: Ill-appearing, reporting ongoing thoracic wall pain Skin: Skin changes noted to left shoulder suspected be secondary to her history of radiation therapy Cardiovascular: Regular rate and rhythm, P1/W2, 2/6 systolic  ejection murmur, no rubs, no gallops  Respiratory: Clear to auscultation bilaterally, no wheezing, no crackles, no rhonchi  Abdomen: Soft, non tender, non distended, bowel sounds present, no guarding  Extremities: Patient has extensive bilateral upper extremity pitting edema Neuro: Grossly nonfocal   Data Reviewed: Basic Metabolic Panel:  Recent Labs Lab 10/25/13 0420 10/26/13 0640 10/27/13 0502 10/28/13 0511 11-23-13 0435  NA 126* 129* 131* 132* 130*  K 4.7 4.3 4.1 4.3 4.2  CL 88* 91* 89* 92* 91*  CO2 29 28 32 31 30  GLUCOSE 104* 96 88 91 94  BUN 32* 27* 25* 33* 35*  CREATININE 1.32* 1.26* 1.17* 1.45* 1.32*  CALCIUM 8.6 8.4 8.4 8.6 8.3*   Liver Function Tests: No results found for this basename: AST, ALT, ALKPHOS, BILITOT, PROT, ALBUMIN,  in the last 168 hours No results found for this basename: LIPASE, AMYLASE,  in the last 168 hours No results found for this basename: AMMONIA,  in the last 168 hours CBC:  Recent Labs Lab 10/23/13 0635 10/24/13 0510 10/25/13 0420 10/26/13 0640 10/27/13 0502  WBC 2.9* 4.8 5.3 4.7 3.8*  HGB 7.9* 8.7* 9.1* 9.2* 9.3*  HCT 24.0* 26.5* 27.8* 27.4* 28.0*  MCV 90.2 90.4 90.6 91.0 91.5  PLT 176 173 167 157 193   Cardiac Enzymes:  Recent Labs Lab 10/27/13 1055  TROPONINI <0.30   BNP (last 3 results)  Recent Labs  08/03/13 2100 10/19/13 0510 10/19/13 1759  PROBNP 612.9* 661.2* 666.3*   CBG: No results found for this basename: GLUCAP,  in the last 168 hours  Recent Results (from the past 240 hour(s))  MRSA PCR SCREENING     Status: None   Collection Time    10/19/13  5:18 PM      Result Value Ref Range Status   MRSA by PCR NEGATIVE  NEGATIVE Final   Comment:            The GeneXpert MRSA Assay (FDA     approved for NASAL specimens     only), is one component of a     comprehensive MRSA colonization     surveillance program. It is not     intended to diagnose MRSA     infection nor to guide or     monitor treatment  for     MRSA infections.     Studies: No results found.  Scheduled Meds: . antiseptic oral rinse  15 mL Mouth Rinse BID  . aspirin EC  81 mg Oral Daily  . carvedilol  6.25 mg Oral BID WC  . coumadin book   Does not apply Once  . cyanocobalamin  500 mcg Oral Q breakfast  . docusate sodium  200 mg Oral BID  . enoxaparin (LOVENOX) injection  1 mg/kg Subcutaneous Q12H  . ferrous sulfate  325 mg Oral Q breakfast  . [START ON 10/30/2013] furosemide  40 mg Oral Daily  . gabapentin  300 mg Oral QHS  . OxyCODONE  40 mg Oral Q12H  . pantoprazole  40 mg Oral Daily  . polyethylene glycol  17 g Oral Daily  . warfarin  7.5 mg Oral ONCE-1800  . warfarin   Does not apply Once  . Warfarin - Pharmacist Dosing Inpatient   Does not apply q1800   Continuous Infusions: . sodium chloride 20 mL/hr at 10/28/13 2300    Active Problems:   ACS (acute coronary syndrome)   NSTEMI (non-ST elevated myocardial infarction)   Cardiomyopathy due to chemotherapy   AKI (acute kidney injury)   Breast cancer metastasized to bone   Dyspnea   Hyperkalemia   Physical deconditioning   Palliative care encounter   Pain, generalized   Protein-calorie malnutrition, severe    Time spent: 35 min    Kelvin Cellar  [facility] Pager [phone]. If 7PM-7AM, please contact night-coverage at [email or web address], password [facility] 11/09/2013, 12:12 PM  LOS: 10 days

## 2013-10-29 NOTE — Progress Notes (Signed)
I met with patient, husband and daughter and discussed end of life care. I told Jeanette Morris despite her and our best efforts her condition continues ot decline. I don't think more chemo would help her functional status improve--the opposite is rather the case. Chemotherapy at this point could well shorten rather than lengthen her life.-- It also does not seem to me she will be able to get home, given the degree of support she is needing.  Accordingly I suggested she consider [facility]. We would write a DNR order and she could be transferred there, where I would follow her on a weekly basis. The family present is in favor of this and the patient was generally in favor but did not want to make a final decision before contacting her other children.  I offered to meet with the family as a group but "they all have different schedules," so we left it the patient's daughter will contact the other siblings; I wrote my comments down for her to share with them; if there is a consensus we can proceed to transfer as a bed becomes available. Hopefully we will have closure on this wirthin the next 24-48 hours.  Will follow closely with you.

## 2013-10-29 NOTE — Clinical Social Work Psychosocial (Signed)
     Clinical Social Work Department BRIEF PSYCHOSOCIAL ASSESSMENT 11/17/2013  Patient:  Jeanette Morris, Jeanette Morris     Account Number:  [id number]     Admit date:  10/19/2013  Clinical Social Worker:  Venia Minks  Date/Time:  11/21/2013 12:00 M  Referred by:  Physician  Date Referred:  11/06/2013 Referred for  Residential hospice placement   Other Referral:   Interview type:  Family Other interview type:    PSYCHOSOCIAL DATA Living Status:  FAMILY Admitted from facility:   Level of care:   Primary support name:  Zetta Bills Primary support relationship to patient:  SPOUSE Degree of support available:   excellent    CURRENT CONCERNS Current Concerns  Post-Acute Placement   Other Concerns:    SOCIAL WORK ASSESSMENT / PLAN CSW met with patient and patient's spouse at bedside. There was also other family members in room. Patient spouse states that they have spoken with the MD and they are interested in residential hospice. If they do want it, they would like [facility]. Spouse states, however, that he has five daughters and that they still havent spoken to one and this is a decision that they will all make together. he does not want CSW to make a referral to [facility] until they can all speak. He will let CSW know when they have reached a decision.   Assessment/plan status:   Other assessment/ plan:   Information/referral to community resources:    PATIENTS/FAMILYS RESPONSE TO PLAN OF CARE: patient is not able to participate and is nonverbal at this time. Spouse appears to be coping well and has extensive support from both family and his pastor. He remains positive that he is making the right decision for his spouse but wants to speak with all his children about it prior to taking action.

## 2013-10-29 NOTE — Progress Notes (Addendum)
Follow up note I was notified by RN that telemetry reported Afib with RVR, with ventricular rates in the 180's. Blood pressure stable at 119/71. She was administered Lopressor 5mg  IV x 1, with a subsequent improvement in HR's in the 140's. I evaluated patient at bedside, her pain symptoms remain poorly controlled. Will start dilaudid continuous infusion at 0.5mg /hour with 1mg  IV q 2 hours for breakthrough pain. Patient transitioning to comfort care. Will d/c telemetry.

## 2013-10-29 NOTE — Evaluation (Signed)
Clinical/Bedside Swallow Evaluation Patient Details  Name: Nakiesha Rumsey MRN: [id number] Date of Birth: 09-27-1945  Today's Date: 11/16/2013 Time: 1110-1146 SLP Time Calculation (min): 36 min  Past Medical History:  Past Medical History  Diagnosis Date  . Hypertension   . Hearing loss     right ear  . Hyperlipidemia   . Arthritis   . Dysrhythmia     A fib with IV chemo treatments  . Shortness of breath     with exertion  . 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 Breast cancer     (Rt) breast ca dx 9/11  . 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 Neuropathy   . History of radiation therapy 01/18/11-03/07/11    5940 cGy to Right chest wall, axilla and supraclavicular region  . Lymphedema of arm 08/28/2013  . Tuberculosis     15 yrs ago- treated for 1 yeat  . History of radiation therapy 09/20/13, 09/23/13, 09/24/13    12 gray t o left lower chest region   Past Surgical History:  Past Surgical History  Procedure Laterality Date  . Tubal ligation    . Portacath placement Left   . Abdominal hysterectomy    . Lung biopsy    . Lesion excision      L anterior thigh  . Breast surgery  12/01/10    ER+,PR+,Ki 67 64%,Her2 -; right breast lumpectomy  . Chest tube insertion  08/08/2012    Procedure: INSERTION PLEURAL DRAINAGE CATHETER;  Surgeon: Gaye Pollack, MD;  Location: [facility];  Service: Thoracic;  Laterality: Right;  . Removal of pleural drainage catheter Right 10/10/2012    Procedure: REMOVAL OF PLEURAL DRAINAGE CATHETER;  Surgeon: Gaye Pollack, MD;  Location: [facility];  Service: Thoracic;  Laterality: Right;  . Mastectomy, radical Left 05/08/2013    Dr Marlou Starks  . Mastectomy modified radical Left 05/08/2013    Procedure: MASTECTOMY MODIFIED RADICAL;  Surgeon: Merrie Roof, MD;  Location: [facility];  Service: General;  Laterality: Left;   HPI:  68 yo female with metastatic breast cancer diagnosed in 2011 and curently on radiation and chemotherapy, last chemo one week ago, off Herceptin since  2014 due to decreased EF (per 2 D ECHO 09/2012 EF 40-45%) and most recent 2 D ECHO 03/2013 with EF 50-55% and grade I diastolic dysfunction, who presented to [facility] ED with main concern of several hours duration of progressively worsening shortness of breath and initially noted with exertion and now present at rest as well, pt explains she is unable to catch a breath. She denies chest pain, no specific abdominal or urinary concerns, no fevers, chills, no specific focal neurological symptoms, no sick contacts or exposures. In ED, pt noted to have elevated troponin at 2.52 and cardiology was consulted. ED doctor considered PE given pt's hypercoagulable state but CT was not done due to lymphedema. Empirically started on IV heparin in ED.     Assessment / Plan / Recommendation Clinical Impression  Pt without focal cn deficit but does report dysphagia to solids more than liquids.  Suspect pt's weakness and lack of upper dentition contributes to her difficulties with solids.  Provided pt with intake of water, applesauce and cracker (moistened with applesauce)- no immediate symptoms of aspiration or airway compromise but pt overtly indicating pain - wincing with swallow.  Delayed productive cough noted to blood tinged secretions.  Suspect pt's dysphagia is consistent with primarily due to possible esophageal issues - ?  possible esophagitis given pt sensation of epigastric pain and recent chemo.  Pt coughing up secretions (blood tinged) with and without po intake during evaluation.  Spouse is ordering foods that pt can tolerate better - including soups and icecream.  Note plans for possible [facility] placement and transitioning to possible comfort.      SLP educated spouse/pt to precautions to mitigate dysphagia including consuming room temperature items, avoiding particulate food items and drinking liquids t/o meal.  Anticipate intake to be poor due to pain that appeared to worsen with po intake.  SLP to sign off as  all education is completed.      Aspiration Risk  Moderate    Diet Recommendation Regular;Thin liquid (softer food items)   Liquid Administration via: Cup;Straw Medication Administration:  (defer to pt to determine method of intake) Supervision: Trained caregiver to feed patient;Full supervision/cueing for compensatory strategies Compensations: Slow rate;Small sips/bites;Follow solids with liquid Postural Changes and/or Swallow Maneuvers: Seated upright 90 degrees;Upright 30-60 min after meal    Other  Recommendations Oral Care Recommendations: Oral care BID   Follow Up Recommendations  None    Frequency and Duration        Pertinent Vitals/Pain Afebrile, decreased      Swallow Study Prior Functional Status   no dysphagia until recently per spouse    General Date of Onset: 11/26/2013 HPI: 68 yo female with metastatic breast cancer diagnosed in 2011 and curently on radiation and chemotherapy, last chemo one week ago, off Herceptin since 2014 due to decreased EF (per 2 D ECHO 09/2012 EF 40-45%) and most recent 2 D ECHO 03/2013 with EF 50-55% and grade I diastolic dysfunction, who presented to [facility] ED with main concern of several hours duration of progressively worsening shortness of breath and initially noted with exertion and now present at rest as well, pt explains she is unable to catch a breath. She denies chest pain, no specific abdominal or urinary concerns, no fevers, chills, no specific focal neurological symptoms, no sick contacts or exposures. In ED, pt noted to have elevated troponin at 2.52 and cardiology was consulted. ED doctor considered PE given pt's hypercoagulable state but CT was not done due to lymphedema. Empirically started on IV heparin in ED.   Type of Study: Bedside swallow evaluation Previous Swallow Assessment: none Diet Prior to this Study: Regular;Thin liquids Temperature Spikes Noted: No Respiratory Status: Nasal cannula History of Recent Intubation:  No Behavior/Cognition: Lethargic;Cooperative (due to pain medications) Oral Cavity - Dentition: Missing dentition (has few lower teeth, no upper denture - edentulous upper) Self-Feeding Abilities: Needs assist Patient Positioning: Upright in bed Baseline Vocal Quality: Low vocal intensity Volitional Cough: Strong (productive cough to blood tinged secretions ) Volitional Swallow: Able to elicit    Oral/Motor/Sensory Function Overall Oral Motor/Sensory Function: Appears within functional limits for tasks assessed (no focal cn deficits)   Ice Chips Ice chips: Not tested   Thin Liquid Thin Liquid: Impaired Presentation: Straw Oral Phase Impairments: Reduced lingual movement/coordination;Impaired anterior to posterior transit Pharyngeal  Phase Impairments: Multiple swallows Other Comments: pt wincing with swallow, ? indicating pain in epigastric area mostly    Nectar Thick Nectar Thick Liquid: Not tested   Honey Thick Honey Thick Liquid: Not tested   Puree Puree: Within functional limits Presentation: Spoon   Solid   GO    Solid: Impaired Oral Phase Impairments: Impaired anterior to posterior transit;Reduced lingual movement/coordination Pharyngeal Phase Impairments: Suspected delayed Swallow Other Comments: soft cracker:  pt wincing indicating  pain      Luanna Salk, [place] [facility] SLP [phone]

## 2013-10-29 NOTE — Progress Notes (Signed)
ANTICOAGULATION CONSULT NOTE - Follow Up Consult  Pharmacy Consult: Lovenox/Warfarin  Indication: suspected PE  No Known Allergies  Patient Measurements: Height: 5\' 4"  (162.6 cm) Weight: 201 lb (91.173 kg) IBW/kg (Calculated) : 54.7 Heparin Dosing Weight: 77 kg  Vital Signs: Temp: 97.7 F (36.5 C) (03/02 2240) Temp src: Oral (03/02 2240) BP: 88/46 mmHg (03/03 0712) Pulse Rate: 60 (03/03 0712)  Labs:  Recent Labs  10/27/13 0502 10/27/13 1055 10/28/13 0511 11/07/2013 0435  HGB 9.3*  --   --   --   HCT 28.0*  --   --   --   PLT 193  --   --   --   LABPROT  --   --   --  14.3  INR  --   --   --  1.13  CREATININE 1.17*  --  1.45* 1.32*  TROPONINI  --  <0.30  --   --     Estimated Creatinine Clearance: 44 ml/min (by C-G formula based on Cr of 1.32).   Medications:  Scheduled:  . antiseptic oral rinse  15 mL Mouth Rinse BID  . aspirin EC  81 mg Oral Daily  . carvedilol  6.25 mg Oral BID WC  . coumadin book   Does not apply Once  . cyanocobalamin  500 mcg Oral Q breakfast  . docusate sodium  200 mg Oral BID  . enoxaparin (LOVENOX) injection  1 mg/kg Subcutaneous Q12H  . ferrous sulfate  325 mg Oral Q breakfast  . furosemide  40 mg Oral BID  . gabapentin  300 mg Oral QHS  . lisinopril  2.5 mg Oral Daily  . OxyCODONE  40 mg Oral Q12H  . pantoprazole  40 mg Oral Daily  . polyethylene glycol  17 g Oral Daily  . warfarin   Does not apply Once  . Warfarin - Pharmacist Dosing Inpatient   Does not apply q1800   Infusions:  . sodium chloride 20 mL/hr at 10/28/13 2300    Assessment: 68 yo female with metastatic breast cancer curently on radiation and chemotherapy. Admitted 2/21 with shortness of breath. Anticoagulation for possible PE, unable to perform CT/angio.  Pharmacy re-consulted today to start warfarin for long term anticoagulation.  Today is D#2/5 Minimum overlap lovenox/warfarin since treating as new VTE  IV heparin 2/21-2/26, Then transitioned to Lovenox 95 mg  Q12h on 2/27.  Weight updated 91.2 kg  Doppler neg for DVT 2/24.  Hgb/Plt okay 3/1, No bleeding reported per RN.  ARF, Scr trending down again, 1.32 with CrCl 44 ml/min   Baseline INR is WNL 1.13, subtherapeutic as expected during initiation   Goal of Therapy:  Heparin level 0.3-0.7 units/ml Monitor platelets by anticoagulation protocol: Yes   Plan:   Reduce Lovenox to 90 mg Q12h (1 mg/kg, weight updated on 3/2)  Repeat Warfarin 7.5 mg x 1 tonight at 1800  Daily PT/INR, CBC Q72h  Warfarin education completed 3/2  Will f/u with palliative consult and plan for long term anticoagulation   Thurlow Gallaga, Gaye Alken PharmD Pager #: [phone] 7:21 AM 11/08/2013

## 2013-10-29 NOTE — Progress Notes (Addendum)
       Patient Name: Jeanette Morris Date of Encounter: 11/15/2013    SUBJECTIVE: No chest pain of significance. Has dyspnea  TELEMETRY:  NSR Filed Vitals:   10/28/13 2240 10/28/2013 0653 11/11/2013 0712 11/25/2013 0732  BP: 110/52 123/44 88/46 91/40   Pulse: 93 56 60 56  Temp: 97.7 F (36.5 C) 97.8 F (36.6 C)    TempSrc: Oral Oral    Resp: 20 20    Height:      Weight:  199 lb 1.6 oz (90.311 kg)    SpO2: 97% 100%      Intake/Output Summary (Last 24 hours) at 11/15/2013 0826 Last data filed at 11/01/2013 0700  Gross per 24 hour  Intake   1260 ml  Output    950 ml  Net    310 ml    LABS: Basic Metabolic Panel:  Recent Labs  10/28/13 0511 10/28/2013 0435  NA 132* 130*  K 4.3 4.2  CL 92* 91*  CO2 31 30  GLUCOSE 91 94  BUN 33* 35*  CREATININE 1.45* 1.32*  CALCIUM 8.6 8.3*   CBC:  Recent Labs  10/27/13 0502  WBC 3.8*  HGB 9.3*  HCT 28.0*  MCV 91.5  PLT 193   Cardiac Enzymes:  Recent Labs  10/27/13 1055  TROPONINI <0.30   BNP (last 3 results)  Recent Labs  08/03/13 2100 10/19/13 0510 10/19/13 1759  PROBNP 612.9* 661.2* 666.3*    Radiology/Studies:  No new  Physical Exam: Blood pressure 91/40, pulse 56, temperature 97.8 F (36.6 C), temperature source Oral, resp. rate 20, height 5\' 4"  (1.626 m), weight 199 lb 1.6 oz (90.311 kg), SpO2 100.00%. Weight change: -1 lb 14.4 oz (-0.862 kg)   Decrease basilar breath sounds  ASSESSMENT:  1. ACS, stable without recurrent CV issue or complaint. CP is atypical 2. Acute on chronic diastolic heart failure 3. Low BP, so will back off meds.  Plan: Conservative therapy  No further current w/u  Demetrios Isaacs 11/20/2013, 8:26 AM

## 2013-10-30 LAB — BASIC METABOLIC PANEL
BUN: 47 mg/dL — AB (ref 6–23)
CHLORIDE: 90 meq/L — AB (ref 96–112)
CO2: 29 mEq/L (ref 19–32)
Calcium: 8.5 mg/dL (ref 8.4–10.5)
Creatinine, Ser: 1.5 mg/dL — ABNORMAL HIGH (ref 0.50–1.10)
GFR calc Af Amer: 40 mL/min — ABNORMAL LOW (ref >90)
GFR calc non Af Amer: 34 mL/min — ABNORMAL LOW (ref >90)
Glucose, Bld: 128 mg/dL — ABNORMAL HIGH (ref 70–99)
POTASSIUM: 4.6 meq/L (ref 3.7–5.3)
Sodium: 130 mEq/L — ABNORMAL LOW (ref 137–147)

## 2013-10-30 LAB — CBC
HEMATOCRIT: 28.1 % — AB (ref 36.0–46.0)
HEMOGLOBIN: 8.9 g/dL — AB (ref 12.0–15.0)
MCH: 29.7 pg (ref 26.0–34.0)
MCHC: 31.7 g/dL (ref 30.0–36.0)
MCV: 93.7 fL (ref 78.0–100.0)
Platelets: 282 10*3/uL (ref 150–400)
RBC: 3 MIL/uL — ABNORMAL LOW (ref 3.87–5.11)
RDW: 15 % (ref 11.5–15.5)
WBC: 5.9 10*3/uL (ref 4.0–10.5)

## 2013-10-30 MED ORDER — ENSURE COMPLETE PO LIQD: 237.0000 mL | ORAL | Status: DC

## 2013-10-30 NOTE — Progress Notes (Signed)
Progress Note from the Palliative Medicine Team at [facility]:  patient is lethargic but alert and oriented, husband at bedside   -large family gathered at bedside,  to discuss diagnosis prognosis, GOC, EOL wishes disposition and options.   A detailed discussion was had today regarding advanced directives.  Concepts specific to code status, artifical feeding and hydration, continued IV antibiotics and rehospitalization was had.  The difference between a aggressive medical intervention path  and a palliative comfort care path for this patient at this time was had.  Values and goals of care important to patient and family were attempted to be elicited.  Concept of Hospice and Palliative Care were discussed  Natural trajectory and expectations at EOL were discussed.  Questions and concerns addressed.  Hard Choices booklet left for review. Family encouraged to call with questions or concerns.  PMT will continue to support holistically.    Objective: No Known Allergies Scheduled Meds: . antiseptic oral rinse  15 mL Mouth Rinse BID  . aspirin EC  81 mg Oral Daily  . carvedilol  6.25 mg Oral BID WC  . cyanocobalamin  500 mcg Oral Q breakfast  . docusate sodium  200 mg Oral BID  . ferrous sulfate  325 mg Oral Q breakfast  . furosemide  40 mg Oral Daily  . gabapentin  300 mg Oral QHS  . OxyCODONE  40 mg Oral Q12H  . pantoprazole  40 mg Oral Daily  . polyethylene glycol  17 g Oral Daily   Continuous Infusions: . sodium chloride 20 mL/hr at 10/28/13 2300  . HYDROmorphone 0.5 mg/hr (11/03/2013 1554)   PRN Meds:.HYDROmorphone, lactulose, nitroGLYCERIN, ondansetron, oxyCODONE, sodium chloride  BP 97/60  Pulse 106  Temp(Src) 98 F (36.7 C) (Oral)  Resp 22  Ht 5\' 4"  (1.626 m)  Wt 92.579 kg (204 lb 1.6 oz)  BMI 35.02 kg/m2  SpO2 100%   PPS:30 %  Pain Score:  C/o  chest pain and lower pain pain   Intake/Output Summary (Last 24 hours) at 10/30/13 1101 Last data filed at  10/30/13 0900  Gross per 24 hour  Intake 1142.67 ml  Output    375 ml  Net 767.67 ml       Physical Exam:  General: ill appearing, overall poor prognosis, more comfortable today HEENT:  Mm, no exudate Chest:   Decreased in bases  CTA CVS: tachycardic and irregular Abdomen: soft NT +BS Ext:  RUE +3 edema, ROL +2 edeam Neuro: lethargic, arouses easily to name  Labs: CBC    Component Value Date/Time   WBC 5.9 10/30/2013 0640   WBC 16.4* 10/16/2013 1210   RBC 3.00* 10/30/2013 0640   RBC 3.07* 10/25/2013 0420   RBC 3.63* 10/16/2013 1210   HGB 8.9* 10/30/2013 0640   HGB 10.6* 10/16/2013 1210   HCT 28.1* 10/30/2013 0640   HCT 33.3* 10/16/2013 1210   PLT 282 10/30/2013 0640   PLT 198 10/16/2013 1210   MCV 93.7 10/30/2013 0640   MCV 91.6 10/16/2013 1210   MCH 29.7 10/30/2013 0640   MCH 29.1 10/16/2013 1210   MCHC 31.7 10/30/2013 0640   MCHC 31.8 10/16/2013 1210   RDW 15.0 10/30/2013 0640   RDW 17.0* 10/16/2013 1210   LYMPHSABS 0.9 10/16/2013 1210   LYMPHSABS 1.3 10/15/2013 1917   MONOABS 0.8 10/16/2013 1210   MONOABS 1.1* 10/15/2013 1917   EOSABS 0.0 10/16/2013 1210   EOSABS 0.0 10/15/2013 1917   BASOSABS 0.0 10/16/2013 1210   BASOSABS  0.0 10/15/2013 1917    BMET    Component Value Date/Time   NA 130* 10/30/2013 0640   NA 134* 10/16/2013 1211   K 4.6 10/30/2013 0640   K 5.1 10/16/2013 1211   CL 90* 10/30/2013 0640   CL 106 02/14/2013 1048   CO2 29 10/30/2013 0640   CO2 25 10/16/2013 1211   GLUCOSE 128* 10/30/2013 0640   GLUCOSE 118 10/16/2013 1211   GLUCOSE 109* 02/14/2013 1048   BUN 47* 10/30/2013 0640   BUN 9.6 10/16/2013 1211   CREATININE 1.50* 10/30/2013 0640   CREATININE 0.9 10/16/2013 1211   CALCIUM 8.5 10/30/2013 0640   CALCIUM 8.0* 10/16/2013 1211   GFRNONAA 34* 10/30/2013 0640   GFRAA 40* 10/30/2013 0640    CMP     Component Value Date/Time   NA 130* 10/30/2013 0640   NA 134* 10/16/2013 1211   K 4.6 10/30/2013 0640   K 5.1 10/16/2013 1211   CL 90* 10/30/2013 0640   CL 106 02/14/2013 1048   CO2 29 10/30/2013  0640   CO2 25 10/16/2013 1211   GLUCOSE 128* 10/30/2013 0640   GLUCOSE 118 10/16/2013 1211   GLUCOSE 109* 02/14/2013 1048   BUN 47* 10/30/2013 0640   BUN 9.6 10/16/2013 1211   CREATININE 1.50* 10/30/2013 0640   CREATININE 0.9 10/16/2013 1211   CALCIUM 8.5 10/30/2013 0640   CALCIUM 8.0* 10/16/2013 1211   PROT 6.7 10/19/2013 0511   PROT 6.5 10/16/2013 1211   ALBUMIN 2.7* 10/19/2013 0511   ALBUMIN 2.7* 10/16/2013 1211   AST 40* 10/19/2013 0511   AST 30 10/16/2013 1211   ALT 14 10/19/2013 0511   ALT 14 10/16/2013 1211   ALKPHOS 76 10/19/2013 0511   ALKPHOS 92 10/16/2013 1211   BILITOT 0.7 10/19/2013 0511   BILITOT 0.55 10/16/2013 1211   GFRNONAA 34* 10/30/2013 0640   GFRAA 40* 10/30/2013 0640      Assessment and Plan: 1. Code Status:DNR/DNI-comfort is main focus of care 2. Symptom Control:         Pain/Dyspnea: much improved on Dilaudid gtt; start at 0.5 mg/hr continuous with bolus for breakthru  3. Psycho/Social:  Emotional support offered to patient and husband 4. Spiritual  Chaplain service consulted 5. Disposition 6.   Family is hopeful for in patietn hospice facility, mention [facility].    Disposition:   Patient Documents Completed or Given: Document Given Completed  Advanced Directives Pkt    MOST    DNR    Gone from My Sight    Hard Choices yes     Time In Time Out Total Time Spent with Patient Total Overall Time  1030 1130  60 min  60 min    Greater than 50%  of this time was spent counseling and coordinating care related to the above assessment and plan.  Wadie Lessen NP  Palliative Medicine Team Team Phone # [phone] Pager [phone]  Discussed with Dr Wendee Beavers 1

## 2013-10-30 NOTE — Progress Notes (Signed)
[facility] is providing the following services: Hospice Pkg D with [employer] fast streamer and Wheelchair  If patient discharges after hours, please call [phone].   Linward Headland 10/30/2013, 2:40 PM

## 2013-10-30 NOTE — Progress Notes (Signed)
Physical Therapy Discharge Patient Details Name: Magaly Pollina MRN: [id number] DOB: 1946-07-01 Today's Date: 10/30/2013 Time:  - 12:35     Patient discharged from PT services secondary to transition to Hospice .PT will sign off. GP     Claretha Cooper 10/30/2013, 12:35 PM

## 2013-10-30 NOTE — Progress Notes (Signed)
OT Cancellation Note  Patient Details Name: Ethel Meisenheimer MRN: [id number] DOB: 1946/05/03   Cancelled Treatment:    Reason Eval/Treat Not Completed: Noted plan to transition to residential hospice and is now comfort care. Will sign off.  Betsy Pries 10/30/2013, 10:55 AM

## 2013-10-30 NOTE — Progress Notes (Signed)
10/30/13 1200  Clinical Encounter Type  Visited With Patient and family together (met pt, children, extended family; private talk w/ spouse)  Visit Type Spiritual support;Social support [facility] consult re palliative care)  Referral From Palliative care team [facility] consult for PC support)  Spiritual Encounters  Spiritual Needs Emotional;Grief support  Stress Factors  Family Stress Factors Major life changes (facing loss)   Made visit with large group of family to introduce [place] and chaplain availability.  Husband Mr Orsak walked me outside to facilitate private conversation.  He shared that he is working to support wife's children and their hopes/emotional needs related to caring for pt at EOL, and he also feels clear that the best choice for pt (and for him as spouse and one of her caregivers/supporters at home) would be to go to [facility] or another residential hospice facility.  Provided reflective listening and pastoral presence as he shared, reflected, and processed his feelings and concerns.  He reports strong appreciation for Wadie Lessen, NP, PC and her understanding of the situation.  At this point, Mr Engebretsen states that he was wanting Spiritual Care for emotional support/space to process, rather than for assistance in negotiating arrangements/outcomes.  He is aware of ongoing chaplain availability.  Please page as needs arise:  [phone].  Thank you.  [place], [place]

## 2013-10-30 NOTE — Progress Notes (Signed)
[facility] PROGRESS NOTE  Marjory Meints EHM:[id number] DOB: 01-Jun-1946 DOA: 10/19/2013 PCP: [name], NP  Assessment/Plan: 1. Breast cancer with metastasis - Oncology and palliative care team on board. - Plan will be to transition to [facility] once bed available.  - Discussed with palliative care team and reviewed oncologist note.  2. Severe Malnutrition - Pt meets criteria for severe MALNUTRITION in the context of chronic illness as evidenced by <75% PO intake for > one month, 11% weight loss in 3 months. - Ensure on board.  Code Status: DNR Family Communication: discussed with husband, daughters, and wife Disposition Plan: [facility]   Consultants:  Palliative care  oncology  Procedures:  None  Antibiotics:  None  HPI/Subjective: No new complaints.  Objective: Filed Vitals:   10/30/13 0639  BP: 97/60  Pulse:   Temp:   Resp:     Intake/Output Summary (Last 24 hours) at 10/30/13 1256 Last data filed at 10/30/13 0900  Gross per 24 hour  Intake 1142.67 ml  Output    375 ml  Net 767.67 ml   Filed Weights   10/28/13 0620 11-18-2013 0653 10/30/13 0625  Weight: 91.173 kg (201 lb) 90.311 kg (199 lb 1.6 oz) 92.579 kg (204 lb 1.6 oz)    Exam:   General:  Pt resting but arrousable  Cardiovascular: RRR, no MRG  Respiratory: CTA BL, now wheezes  Abdomen: soft, obese, NT  Musculoskeletal: no cyanosis or clubbing   Data Reviewed: Basic Metabolic Panel:  Recent Labs Lab 10/26/13 0640 10/27/13 0502 10/28/13 0511 11/18/13 0435 10/30/13 0640  NA 129* 131* 132* 130* 130*  K 4.3 4.1 4.3 4.2 4.6  CL 91* 89* 92* 91* 90*  CO2 28 32 31 30 29   GLUCOSE 96 88 91 94 128*  BUN 27* 25* 33* 35* 47*  CREATININE 1.26* 1.17* 1.45* 1.32* 1.50*  CALCIUM 8.4 8.4 8.6 8.3* 8.5   Liver Function Tests: No results found for this basename: AST, ALT, ALKPHOS, BILITOT, PROT, ALBUMIN,  in the last 168 hours No results found for this basename:  LIPASE, AMYLASE,  in the last 168 hours No results found for this basename: AMMONIA,  in the last 168 hours CBC:  Recent Labs Lab 10/24/13 0510 10/25/13 0420 10/26/13 0640 10/27/13 0502 10/30/13 0640  WBC 4.8 5.3 4.7 3.8* 5.9  HGB 8.7* 9.1* 9.2* 9.3* 8.9*  HCT 26.5* 27.8* 27.4* 28.0* 28.1*  MCV 90.4 90.6 91.0 91.5 93.7  PLT 173 167 157 193 282   Cardiac Enzymes:  Recent Labs Lab 10/27/13 1055  TROPONINI <0.30   BNP (last 3 results)  Recent Labs  08/03/13 2100 10/19/13 0510 10/19/13 1759  PROBNP 612.9* 661.2* 666.3*   CBG: No results found for this basename: GLUCAP,  in the last 168 hours  No results found for this or any previous visit (from the past 240 hour(s)).   Studies: No results found.  Scheduled Meds: . antiseptic oral rinse  15 mL Mouth Rinse BID  . aspirin EC  81 mg Oral Daily  . carvedilol  6.25 mg Oral BID WC  . cyanocobalamin  500 mcg Oral Q breakfast  . docusate sodium  200 mg Oral BID  . furosemide  40 mg Oral Daily  . gabapentin  300 mg Oral QHS  . pantoprazole  40 mg Oral Daily  . polyethylene glycol  17 g Oral Daily   Continuous Infusions: . sodium chloride 20 mL/hr at 10/28/13 2300  . HYDROmorphone 0.5 mg/hr (November 18, 2013  1554)    Active Problems:   Cardiomyopathy due to chemotherapy   AKI (acute kidney injury)   Breast cancer metastasized to bone   Dyspnea   ACS (acute coronary syndrome)   NSTEMI (non-ST elevated myocardial infarction)   Hyperkalemia   Physical deconditioning   Palliative care encounter   Pain, generalized   Protein-calorie malnutrition, severe    Time spent: > 35 minutes    Velvet Bathe  [facility] Pager [phone] If 7PM-7AM, please contact night-coverage at [email or web address], password [facility] 10/30/2013, 12:56 PM  LOS: 11 days

## 2013-10-30 NOTE — Progress Notes (Signed)
CSW met with patients husband. He is now requesting patient go home with home hospice. CSW offered emotional support. CSW notified RN Transport planner.  Erich Kochan C. [place] MSW, [place]

## 2013-10-30 NOTE — Progress Notes (Signed)
CSW met with patient and family at bedside. They are now requesting [facility]. CSW made referral. There is a bed for patient to go to [facility] on Friday. Paperwork to be completed tomorrow. Family expresses gratitude and are so appreciative that there is a bed available as they were concerned that patient would not be able to be managed at home.  Tineshia Becraft C. [place] MSW, [place]

## 2013-10-30 NOTE — Progress Notes (Signed)
Received referral from [facility] for home with [facility]: spoke with pt, daughters Alroy Dust and niece Otila Kluver, they called pt's husband [name] on phone during discussion; daughters all voiced the families preference, at this time, is for pt to transfer directly to [facility] from the hospital; they informed this is what they had discussed this morning with PMT NP, Wadie Lessen and also with Dr Jana Hakim in the past; they want to be sure a [employer] referral has been madeWater quality scientist contacted [place] and also notified [facility] Juliann Pulse and Pura Sp[facility] representative to place a stop on any DME order.  Family very appreciative of staff's efforts to assist them and patient during this difficult time. Danton Sewer, RN [phone]

## 2013-10-30 NOTE — Progress Notes (Signed)
COURTESY NOTE:  Family meeting w Palliative care planned for today at 10 AM. -- Daughter wondered if patient "could go home." We reviewed the fact that patient has gone home but needed to be readmitted after a short period on several occasions-- that suggests d/c to home would not be the best solution unless the family understands in case of pain, SOB and so on they will manage (with Hospice's help) w/o calling 911.  My vote is for [facility], where I feel the patient would be safe and comfortable. If transferred tehre, I will follow up on her weekly (on Fridays)  Appreciate the Palliative team's help to this family!

## 2013-10-31 LAB — BASIC METABOLIC PANEL
BUN: 56 mg/dL — AB (ref 6–23)
CO2: 28 mEq/L (ref 19–32)
Calcium: 8.6 mg/dL (ref 8.4–10.5)
Chloride: 93 mEq/L — ABNORMAL LOW (ref 96–112)
Creatinine, Ser: 1.56 mg/dL — ABNORMAL HIGH (ref 0.50–1.10)
GFR calc Af Amer: 38 mL/min — ABNORMAL LOW (ref >90)
GFR calc non Af Amer: 33 mL/min — ABNORMAL LOW (ref >90)
GLUCOSE: 101 mg/dL — AB (ref 70–99)
POTASSIUM: 4.7 meq/L (ref 3.7–5.3)
Sodium: 133 mEq/L — ABNORMAL LOW (ref 137–147)

## 2013-10-31 MED ORDER — METOCLOPRAMIDE HCL 5 MG/ML IJ SOLN: 10.0000 mg | Freq: Four times a day (QID) | INTRAMUSCULAR | Status: DC | PRN

## 2013-10-31 MED ORDER — METOCLOPRAMIDE HCL 5 MG/ML IJ SOLN
5.0000 mg | Freq: Once | INTRAMUSCULAR | Status: AC
  Administered 2013-10-31: 5 mg via INTRAVENOUS
  Filled 2013-10-31: qty 2

## 2013-10-31 NOTE — Consult Note (Signed)
[facility] Liaison: [facility] available for Jeanette Morris tomorrow 11/01/2013. Completed registration paperwork with patient, Jeanette Morris and daughter Jeanette Morris. They are pleased Dr. Jana Hakim to assume care at [facility]. Will need DC summary faxed to [phone] and RN to call report to [phone]. Thank you. Erling Conte LCSW [phone]

## 2013-10-31 NOTE — Progress Notes (Signed)
[facility] PROGRESS NOTE  Jeanette Morris SEG:[id number] DOB: [birth year] DOA: 10/19/2013 PCP: [name], NP  Assessment/Plan: 1. Breast cancer with metastasis - Oncology and palliative care team on board. - Plan will be to transition to [facility] place once bed available.  - Discussed with palliative care team and reviewed oncologist note.  2. Severe Malnutrition - Pt meets criteria for severe MALNUTRITION in the context of chronic illness as evidenced by <75% PO intake for > one month, 11% weight loss in 3 months. - Ensure on board.  3. Nausea - Protonix and Zofran on board - Patient was still complaining of nausea despite above regimen.  Will add reglan  Code Status: DNR Family Communication: discussed with husband, daughters, and wife [facility]position Plan: [facility] 11/01/13   Consultants:  Palliative care  oncology  Procedures:  None  Antibiotics:  None  HPI/Subjective: Pt was complaining of some nausea and discomfort today.  No new complaints otherwise.  Objective: Filed Vitals:   10/31/13 0608  BP: 95/55  Pulse: 117  Temp: 98.1 F (36.7 C)  Resp: 24    Intake/Output Summary (Last 24 hours) at 10/31/13 1428 Last data filed at 10/31/13 0610  Gross per 24 hour  Intake 242.23 ml  Output    560 ml  Net -317.77 ml   Filed Weights   10/28/13 0620 11/04/2013 0653 10/30/13 0625  Weight: 91.173 kg (201 lb) 90.311 kg (199 lb 1.6 oz) 92.579 kg (204 lb 1.6 oz)    Exam:   General:  Pt alert and awake.  Cardiovascular: RRR, no MRG  Respiratory: CTA BL, now wheezes  Abdomen: soft, obese, NT  Musculoskeletal: no cyanosis or clubbing   Data Reviewed: Basic Metabolic Panel:  Recent Labs Lab 10/27/13 0502 10/28/13 0511 11/07/2013 0435 10/30/13 0640 10/31/13 0345  NA 131* 132* 130* 130* 133*  K 4.1 4.3 4.2 4.6 4.7  CL 89* 92* 91* 90* 93*  CO2 32 31 30 29 28   GLUCOSE 88 91 94 128* 101*  BUN 25* 33* 35* 47* 56*   CREATININE 1.17* 1.45* 1.32* 1.50* 1.56*  CALCIUM 8.4 8.6 8.3* 8.5 8.6   Liver Function Tests: No results found for this basename: AST, ALT, ALKPHOS, BILITOT, PROT, ALBUMIN,  in the last 168 hours No results found for this basename: LIPASE, AMYLASE,  in the last 168 hours No results found for this basename: AMMONIA,  in the last 168 hours CBC:  Recent Labs Lab 10/25/13 0420 10/26/13 0640 10/27/13 0502 10/30/13 0640  WBC 5.3 4.7 3.8* 5.9  HGB 9.1* 9.2* 9.3* 8.9*  HCT 27.8* 27.4* 28.0* 28.1*  MCV 90.6 91.0 91.5 93.7  PLT 167 157 193 282   Cardiac Enzymes:  Recent Labs Lab 10/27/13 1055  TROPONINI <0.30   BNP (last 3 results)  Recent Labs  08/03/13 2100 10/19/13 0510 10/19/13 1759  PROBNP 612.9* 661.2* 666.3*   CBG: No results found for this basename: GLUCAP,  in the last 168 hours  No results found for this or any previous visit (from the past 240 hour(s)).   Studies: No results found.  Scheduled Meds: . antiseptic oral rinse  15 mL Mouth Rinse BID  . aspirin EC  81 mg Oral Daily  . carvedilol  6.25 mg Oral BID WC  . cyanocobalamin  500 mcg Oral Q breakfast  . docusate sodium  200 mg Oral BID  . feeding supplement (ENSURE COMPLETE)  237 mL Oral Q24H  . furosemide  40 mg Oral  Daily  . gabapentin  300 mg Oral QHS  . pantoprazole  40 mg Oral Daily  . polyethylene glycol  17 g Oral Daily   Continuous Infusions: . sodium chloride 20 mL/hr at 10/28/13 2300  . HYDROmorphone 0.5 mg/hr (10/31/13 1000)    Active Problems:   Cardiomyopathy due to chemotherapy   AKI (acute kidney injury)   Breast cancer metastasized to bone   Dyspnea   ACS (acute coronary syndrome)   NSTEMI (non-ST elevated myocardial infarction)   Hyperkalemia   Physical deconditioning   Palliative care encounter   Pain, generalized   Protein-calorie malnutrition, severe    Time spent: > 35 minutes    Velvet Bathe  [facility] Pager [phone] If 7PM-7AM, please contact  night-coverage at [email or web address], password [facility] 10/31/2013, 2:28 PM  LOS: 12 days

## 2013-10-31 NOTE — Progress Notes (Signed)
CSW met with Jeanette Morris to see if there is a [facility] bed available today for patient. There are already several admissions today and they are unable to take patient until tomorrow. CSW received call from [place] in regards to patient possibly being discharged home today instead of waiting for a bed. Left voicemail for Dr. Reynaldo Minium. CSW discussed with Dr. Wendee Beavers. He states that the patient is having more symptom management issues such as increased nausea and medications not working, etc, and feels that patient is not appropriate for discharge to home at this time and he wishes to keep patient overnight until bed is available at [facility] tomorrow.  Chyenne Sobczak C. Council MSW, [place]

## 2013-10-31 NOTE — Consult Note (Signed)
[facility] Liaison: Received request from [employer] at 4:05 pm yesterday 10/30/2013 for family interest in [facility]. CSW informed at that time a [employer] is available for Mrs. Dorce Friday 11/01/2013. Requested CSW make family aware. Plan to meet with family today to complete registration paperwork for transfer Friday 11/01/2013. Thank you. Erling Conte LCSW [phone]

## 2013-11-01 LAB — BASIC METABOLIC PANEL
BUN: 63 mg/dL — ABNORMAL HIGH (ref 6–23)
CALCIUM: 8.7 mg/dL (ref 8.4–10.5)
CHLORIDE: 93 meq/L — AB (ref 96–112)
CO2: 28 meq/L (ref 19–32)
Creatinine, Ser: 1.62 mg/dL — ABNORMAL HIGH (ref 0.50–1.10)
GFR calc Af Amer: 36 mL/min — ABNORMAL LOW (ref >90)
GFR calc non Af Amer: 31 mL/min — ABNORMAL LOW (ref >90)
Glucose, Bld: 105 mg/dL — ABNORMAL HIGH (ref 70–99)
Potassium: 4.5 mEq/L (ref 3.7–5.3)
SODIUM: 133 meq/L — AB (ref 137–147)

## 2013-11-01 MED ORDER — HEPARIN SOD (PORK) LOCK FLUSH 100 UNIT/ML IV SOLN
500.0000 [IU] | INTRAVENOUS | Status: AC | PRN
  Administered 2013-11-01: 500 [IU]

## 2013-11-01 MED ORDER — OXYCODONE HCL 5 MG PO TABS: 5.0000 mg | ORAL_TABLET | ORAL | Status: AC | PRN

## 2013-11-01 MED ORDER — FUROSEMIDE 40 MG PO TABS: 40.0000 mg | ORAL_TABLET | Freq: Every day | ORAL | Status: AC

## 2013-11-01 MED ORDER — OXYCODONE HCL ER 40 MG PO T12A: 40.0000 mg | EXTENDED_RELEASE_TABLET | Freq: Two times a day (BID) | ORAL | Status: AC

## 2013-11-01 NOTE — Progress Notes (Signed)
Patient discharged to [facility] place for continuous care via ambulance. Patient alert/awake, pain medication given prior to transfer.  Stage III ulcer, no odor or drainage, dressing clean/dry/intact. Family at the bedside during discharge/transport. Report diven to nurse Tomasa Rand at [facility].

## 2013-11-01 NOTE — Progress Notes (Signed)
55cc of IV dilaudid wasted and witnessed/cosigned by Micheal Likens. Wasted in the sink.

## 2013-11-01 NOTE — Progress Notes (Addendum)
Patient cleared for discharge. Bed available at [facility]. Discharge summary faxed over. CSW met with patient and family at bedside. Patient is much more oriented today. She is agreeable to being transferred to [facility]. Family thanked CSW profusely for assistance in transfer to [facility]. They are very happy that a bed was available. Packet copied and placed in wall. ptar called for transport.  Cristalle Rohm C. [place] MSW, [place]

## 2013-11-01 NOTE — Discharge Summary (Signed)
Physician Discharge Summary  Clarice Zulauf WCB:[id number] DOB: November 03, 1945 DOA: 10/19/2013  PCP: [name], NP  Admit date: 10/19/2013 Discharge date: 11/01/2013  Time spent: > 35 minutes  Recommendations for Outpatient Follow-up:  1. Continue to adjust medications to ensure comfort care measures  Discharge Diagnoses:  Active Problems:   Cardiomyopathy due to chemotherapy   AKI (acute kidney injury)   Breast cancer metastasized to bone   Dyspnea   ACS (acute coronary syndrome)   NSTEMI (non-ST elevated myocardial infarction)   Hyperkalemia   Physical deconditioning   Palliative care encounter   Pain, generalized   Protein-calorie malnutrition, severe   Discharge Condition: stable  Diet recommendation: regular as tolerated  Filed Weights   10/28/13 0620 10/27/2013 0653 10/30/13 0625  Weight: 91.173 kg (201 lb) 90.311 kg (199 lb 1.6 oz) 92.579 kg (204 lb 1.6 oz)    History of present illness:  68 y/o with metastatic breast cancer diagnosed in 2011 and curently on radiation and chemotherapy, last chemo one week ago, off Herceptin since 2014 due to decreased EF (per 2 D ECHO 09/2012 EF 40-45%) and most recent 2 D ECHO 03/2013 with EF 50-55% and grade I diastolic dysfunction, who presented to [facility] ED with main concern of several hours duration of progressively worsening shortness of breath and initially noted with exertion and now present at rest as well, pt explains she is unable to catch a breath.   Hospital Course:  1. Breast cancer with metastasis - Oncology and palliative care team on board.  - Plan will be to transition to [facility] once ready for discharge - Discussed with palliative care team - Oncologist to continue to monitor after hospital discharge.  2. Severe Malnutrition  - Pt meets criteria for severe MALNUTRITION in the context of chronic illness as evidenced by <75% PO intake for > one month, 11% weight loss in 3 months.  - Ensure on board.   3.  Nausea  - Protonix and Zofran on board  - Patient was still complaining of nausea despite above regimen. Will add reglan   Consultations:  Palliative Care team  Discharge Exam: Filed Vitals:   10/31/13 1500  BP: 111/41  Pulse: 67  Temp: 98.6 F (37 C)  Resp: 20    General: Pt in NAD, alert and awake Cardiovascular: no cyanosis, warm extremities Respiratory: No audible wheezes, breathing comfortably with supplemental oxygen.  Discharge Instructions  Discharge Orders   Future Appointments Provider Department Dept Phone   11/06/2013 10:15 AM [facility] [place] [facility] [phone]   11/06/2013 10:30 AM [facility] Flush Nurse [place] [facility] [phone]   11/06/2013 11:15 AM [name] G Berry, [facility] [facility] [phone]   11/13/2013 9:45 AM [facility] [facility] [phone]   11/13/2013 10:00 AM [facility] Flush Nurse [facility] [facility] [phone]   11/13/2013 10:45 AM [name] Fredderick [place] [place] [facility] [phone]   11/14/2013 11:15 AM [facility] Inj Nurse [place] [facility] [phone]   11/25/2013 9:00 AM [facility] [phone]   Pt should arrive15 minutes prior to scheduled appt time. Please inform patient that exam will take a minimum of 1 1/2 hours. Patient to be NPO 6 hours prior to exam  and should not take any insulin the day of exam.   11/27/2013 2:00 PM [facility] [place]  [place] [facility] [phone]   11/27/2013 2:30 PM Chauncey Cruel, MD [place] [facility] [phone]   12/04/2013 11:30 AM [facility] 5 [place] [facility] [phone]   Future Orders Complete By Expires   Call MD for:  persistant nausea and vomiting  As directed    Call MD  for:  severe uncontrolled pain  As directed    Diet - low sodium heart healthy  As directed    Discharge instructions  As directed    Comments:     Please have physician at facility continue to monitor and treat pain and adjust pain medication as needed.  Continue comfort care measures.   Increase activity slowly  As directed        Medication List    STOP taking these medications       dronabinol 2.5 MG capsule  Commonly known as:  MARINOL     PRESCRIPTION MEDICATION      TAKE these medications       Calcium Carbonate-Vitamin D 600-400 MG-UNIT per tablet  Take 1 tablet by mouth every morning.     carvedilol 6.25 MG tablet  Commonly known as:  COREG  Take 6.25 mg by mouth daily.     cyanocobalamin 500 MCG tablet  Take 500 mcg by mouth daily with breakfast.     ferrous sulfate 325 (65 FE) MG tablet  Take 1 tablet (325 mg total) by mouth daily with breakfast.     furosemide 40 MG tablet  Commonly known as:  LASIX  Take 1 tablet (40 mg total) by mouth daily.     gabapentin 300 MG capsule  Commonly known as:  NEURONTIN  Take 300 mg by mouth at bedtime.     ondansetron 8 MG tablet  Commonly known as:  ZOFRAN  Take 8 mg by mouth 2 (two) times daily.     oxyCODONE 5 MG immediate release tablet  Commonly known as:  Oxy IR/ROXICODONE  Take 1 tablet (5 mg total) by mouth every 4 (four) hours as needed for severe pain.     OxyCODONE 40 mg T12a 12 hr tablet  Commonly known as:  OXYCONTIN  Take 1 tablet (40 mg total) by mouth every 12 (twelve) hours.     polyethylene glycol packet  Commonly known as:  MIRALAX / GLYCOLAX  Take 17 g by mouth daily.       No Known Allergies    The results of significant diagnostics from this hospitalization (including imaging, microbiology, ancillary and laboratory) are listed below for reference.    Significant Diagnostic Studies: Dg Chest 2 View  10/19/2013   CLINICAL DATA:  Shortness of breath and wheezing.  EXAM: CHEST  2  VIEW  COMPARISON:  Chest radiograph performed 10/15/2013  FINDINGS: The lungs are mildly hypoexpanded. No definite focal consolidation, pleural effusion or pneumothorax is seen.  The cardiomediastinal silhouette is borderline normal in size. No acute osseous abnormalities are seen. Clips are seen at both axilla. A left subclavian line is seen ending about the proximal SVC.  IMPRESSION: Lungs mildly hypoexpanded but grossly clear.   Electronically Signed   By: Garald Balding M.D.   On: 10/19/2013 04:24   Dg Chest Portable 1 View  10/15/2013   CLINICAL DATA:  Left side chest pain  EXAM: PORTABLE CHEST - 1 VIEW  COMPARISON:  09/17/2013  FINDINGS: Cardiomegaly again noted. Degenerative changes thoracic spine. Surgical clips are noted in right axilla. No acute  infiltrate or pulmonary edema. Stable old right lower rib fractures. Stable position of left subclavian central line.  IMPRESSION: No active disease.  Cardiomegaly again noted.   Electronically Signed   By: Lahoma Crocker M.D.   On: 10/15/2013 15:51    Microbiology: No results found for this or any previous visit (from the past 240 hour(s)).   Labs: Basic Metabolic Panel:  Recent Labs Lab 10/28/13 0511 11-03-13 0435 10/30/13 0640 10/31/13 0345 11/01/13 0412  NA 132* 130* 130* 133* 133*  K 4.3 4.2 4.6 4.7 4.5  CL 92* 91* 90* 93* 93*  CO2 31 30 29 28 28   GLUCOSE 91 94 128* 101* 105*  BUN 33* 35* 47* 56* 63*  CREATININE 1.45* 1.32* 1.50* 1.56* 1.62*  CALCIUM 8.6 8.3* 8.5 8.6 8.7   Liver Function Tests: No results found for this basename: AST, ALT, ALKPHOS, BILITOT, PROT, ALBUMIN,  in the last 168 hours No results found for this basename: LIPASE, AMYLASE,  in the last 168 hours No results found for this basename: AMMONIA,  in the last 168 hours CBC:  Recent Labs Lab 10/26/13 0640 10/27/13 0502 10/30/13 0640  WBC 4.7 3.8* 5.9  HGB 9.2* 9.3* 8.9*  HCT 27.4* 28.0* 28.1*  MCV 91.0 91.5 93.7  PLT 157 193 282   Cardiac  Enzymes:  Recent Labs Lab 10/27/13 1055  TROPONINI <0.30   BNP: BNP (last 3 results)  Recent Labs  08/03/13 2100 10/19/13 0510 10/19/13 1759  PROBNP 612.9* 661.2* 666.3*   CBG: No results found for this basename: GLUCAP,  in the last 168 hours     Signed:  Velvet Bathe  [facility] 11/01/2013, 11:07 AM

## 2013-11-01 NOTE — Progress Notes (Signed)
Verified waste. Andre Lefort

## 2013-11-01 NOTE — Plan of Care (Signed)
Problem: Discharge Progression Outcomes Goal: Hemodynamically stable Outcome: Not Progressing Hospice care. Goal: Complications resolved/controlled Outcome: Not Met (add Reason) F/U with Hospice care  Goal: Activity appropriate for discharge plan Outcome: Not Met (add Reason) Hospice care.

## 2013-11-01 NOTE — Consult Note (Signed)
[facility] Liaison: [employer] available for Mrs. Hemmerich today. Dr. Jana Hakim to assume care.  RN please do not de-access her chest port so it can be used at [employer] [facility]. RN please call report to [phone]. Please fax discharge summary to [phone] and arrange transport for patient to arrive at [facility] before noon if possible. Thank you. Erling Conte LCSW [phone]

## 2013-11-04 ENCOUNTER — Telehealth: Payer: Self-pay | Admitting: *Deleted

## 2013-11-04 ENCOUNTER — Encounter: Payer: Self-pay | Admitting: *Deleted

## 2013-11-04 ENCOUNTER — Other Ambulatory Visit: Payer: Self-pay | Admitting: Oncology

## 2013-11-04 NOTE — Telephone Encounter (Signed)
Rec'd fax notice of death from [facility] on 11-06-13@1158 .

## 2013-11-06 ENCOUNTER — Other Ambulatory Visit: Payer: PRIVATE HEALTH INSURANCE

## 2013-11-06 ENCOUNTER — Ambulatory Visit: Payer: PRIVATE HEALTH INSURANCE | Admitting: Physician Assistant

## 2013-11-12 NOTE — Consult Note (Signed)
I have reviewed and discussed the care of this patient in detail with the nurse practitioner including pertinent patient records, physical exam findings and data. I agree with details of this encounter.  

## 2013-11-13 ENCOUNTER — Other Ambulatory Visit: Payer: PRIVATE HEALTH INSURANCE

## 2013-11-13 ENCOUNTER — Ambulatory Visit: Payer: PRIVATE HEALTH INSURANCE | Admitting: Physician Assistant

## 2013-11-14 ENCOUNTER — Ambulatory Visit: Payer: PRIVATE HEALTH INSURANCE

## 2013-11-25 ENCOUNTER — Ambulatory Visit (HOSPITAL_COMMUNITY): Payer: PRIVATE HEALTH INSURANCE

## 2013-11-27 ENCOUNTER — Other Ambulatory Visit: Payer: PRIVATE HEALTH INSURANCE

## 2013-11-27 ENCOUNTER — Ambulatory Visit: Payer: PRIVATE HEALTH INSURANCE | Admitting: Oncology

## 2013-11-27 DEATH — deceased

## 2013-12-04 ENCOUNTER — Other Ambulatory Visit: Payer: PRIVATE HEALTH INSURANCE

## 2014-02-23 IMAGING — PT NM PET TUM IMG RESTAG (PS) SKULL BASE T - THIGH
6 series · 25 of 25 positions shown · non-contrast
Comparison: NM PET IMAGE RESTAG (PS) SKULL BASE TO THIGH dated
07/02/2013

CLINICAL DATA: Subsequent treatment strategy for bilateral breast
cancer.

EXAM:
NUCLEAR MEDICINE PET SKULL BASE TO THIGH
FASTING BLOOD GLUCOSE:  Value: 112mg/dl
TECHNIQUE: 16.4 mCi F-18 FDG was injected intravenously. CT data was obtained
and used for attenuation correction and anatomic localization only.
(This was not acquired as a diagnostic CT examination.) Additional
exam technical data entered on technologist worksheet.

[Series 1: pet ac 2d · axial · 3.3mm · 4.69mm/px · z∈[-868,+2]mm · 5 of 267 slices shown]
[im 1/267]
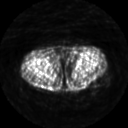
[im 67/267]
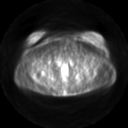
[im 134/267]
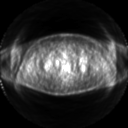
[im 200/267]
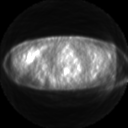
[im 267/267]
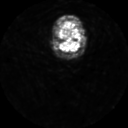

[Series 2: ct slices · axial · 3.8mm · 0.98mm/px · z∈[-868,+2]mm · 5 of 266 slices shown]
[im 1/266]
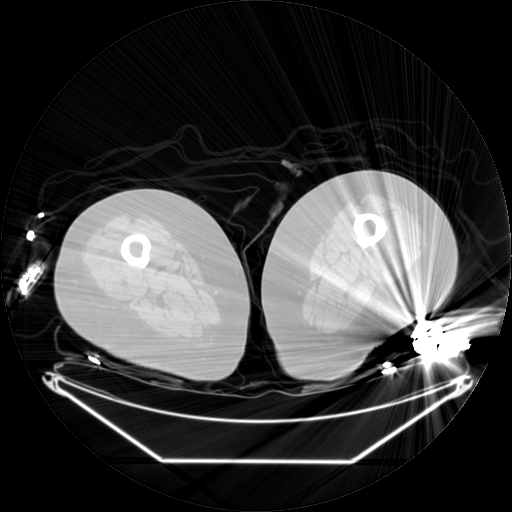
[im 67/266]
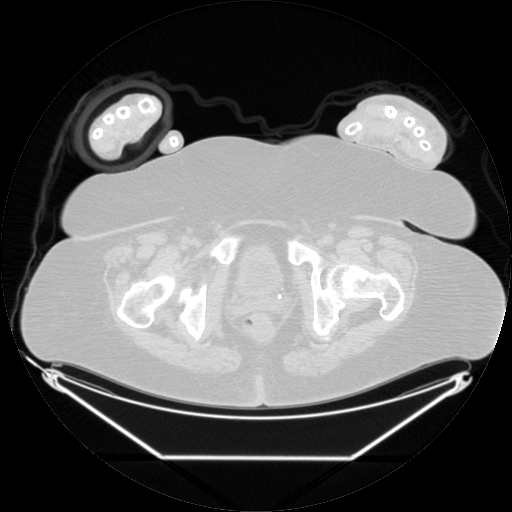
[im 133/266]
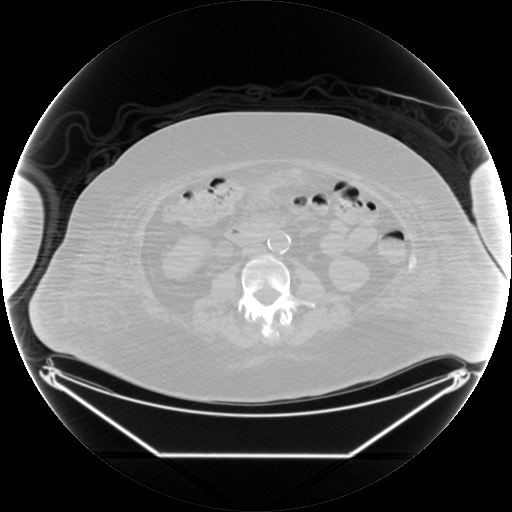
[im 199/266]
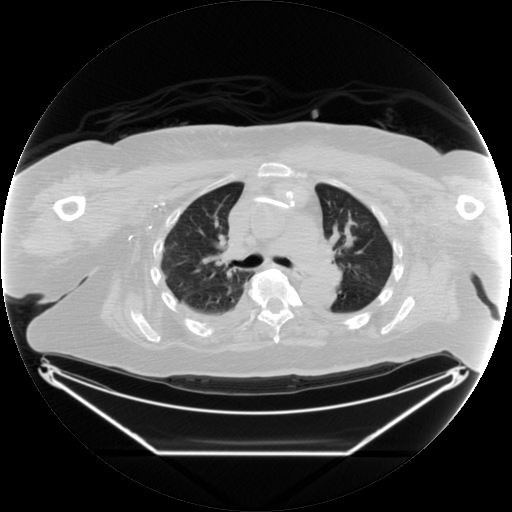
[im 266/266]
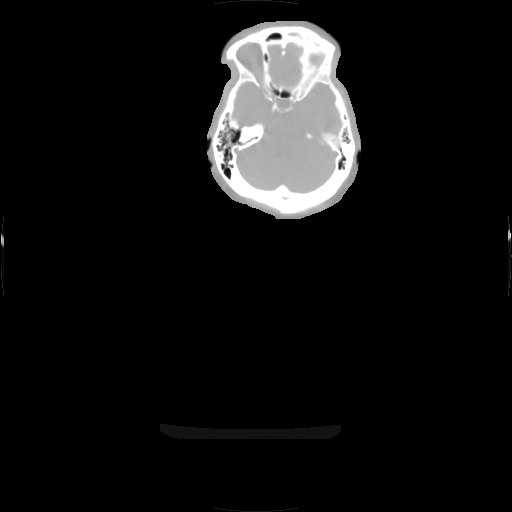

[Series 2: pet nac 2d · axial · 3.3mm · 4.69mm/px · z∈[-868,+2]mm · 6 of 267 slices shown]
[im 1/267]
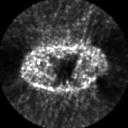
[im 54/267]
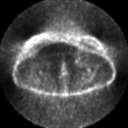
[im 107/267]
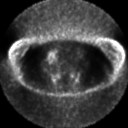
[im 160/267]
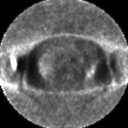
[im 213/267]
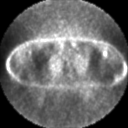
[im 267/267]
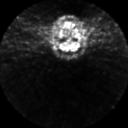

[Series 123: mip · coronal · 3.3mm · 4.69mm/px · 1 of 60 slices shown]
[im 1/60]
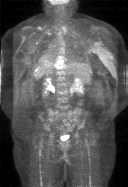

[Series 151: reformatted · axial · 3.3mm · 3.91mm/px · z∈[-868,+2]mm · 6 of 265 slices shown (1 of 2)]
[im 1/265]
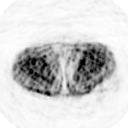
[im 53/265]
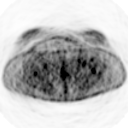
[im 106/265]
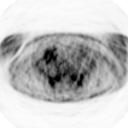
[im 159/265]
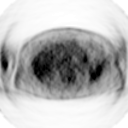
[im 212/265]
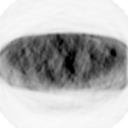
[im 265/265]
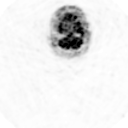

[Series 153: reformatted · coronal · 4.7mm · 6.98mm/px · 2 of 79 slices shown (2 of 2)]
[im 1/79]
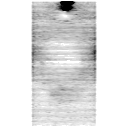
[im 79/79]
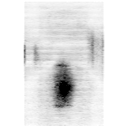

[25 of 25 positions shown; findings below may reference images not displayed]

FINDINGS: NECK

There is persistent but improved bilateral supraclavicular
hypermetabolic adenopathy.These are ill-defined and somewhat
difficult to measure, although measure approximately 2.2 cm on the
right (image 45, SUV max 8.4) and 1.9 cm on the left (image 49, SUV
max 7.0). There is no upper cervical adenopathy. Asymmetric goiter
appears stable without abnormal metabolic activity.

CHEST

There has been interval improvement in the multifocal activity
within the left chest wall and axilla. There is a residual 2.5 cm
component adjacent to the left clavicle on image 41. There is no
mediastinal or hilar hypermetabolic nodal activity. Diffuse muscular
activity is again noted within the upper right arm, similar to the
prior study. Substernal extension of goiter, coronary artery disease
and bilateral pleural effusions are again noted. No residual
abnormal activity is seen within the right pleural space.

ABDOMEN/PELVIS

There is no hypermetabolic activity within the liver, adrenal
glands, spleen or pancreas. There is no significant residual central
mesenteric hypermetabolic activity. The left external iliac node
noted previously has resolved. There is new dilatation of the right
renal pelvis and proximal ureter without high-grade ureteral
obstruction. Porcelain gallbladder again noted.

SKELETON

Multifocal osseous metastases are again noted. The upper thoracic
and rib activity noted previously has improved. However, there is
worsening activity within the lower thoracic spine. This corresponds
with enlarging lytic lesions involving the left aspects of the T9,
T10 and T11 vertebral bodies and posterior elements (CT images 82-
100). There is left paraspinal soft tissue tumor as well as possible
epidural tumor, difficult to assess given streak artifact from the
patient's arms. Additional lesions are present within the ribs and
bony pelvis.
IMPRESSION: 1. There has been improvement in the left chest wall activity and
multiple nodal metastasis involving the left axilla and
supraclavicular stations bilaterally.
2. Persistent bilateral pleural effusions without associated
abnormal metabolic activity.
3. Fluctuating osseous metastases with improvement in the upper
thoracic spine and upper right chest wall. There is worsening
disease in the lower left thoracic spine with the potential for
epidural tumor in this region. Again, spinal assessment is limited
by body habitus. If the patient has focal neurologic symptoms,
further evaluation with MRI may be helpful.

## 2014-04-23 ENCOUNTER — Other Ambulatory Visit: Payer: Self-pay | Admitting: Pharmacist

## 2014-06-12 ENCOUNTER — Encounter: Payer: Self-pay | Admitting: *Deleted

## 2014-10-17 NOTE — Telephone Encounter (Signed)
none
